# Patient Record
Sex: Female | Born: 1965 | Race: Black or African American | Hispanic: No | Marital: Single | Smoking: Never smoker
Health system: Southern US, Community
[De-identification: ages and names within clinical notes are randomized; demographics above are authoritative.]

## PROBLEM LIST (undated history)

## (undated) DIAGNOSIS — I10 Essential (primary) hypertension: Secondary | ICD-10-CM

## (undated) DIAGNOSIS — E119 Type 2 diabetes mellitus without complications: Secondary | ICD-10-CM

## (undated) HISTORY — PX: CHOLECYSTECTOMY: SHX55

## (undated) HISTORY — PX: FRACTURE SURGERY: SHX138

---

## 2006-08-18 ENCOUNTER — Emergency Department: Payer: Self-pay | Admitting: Emergency Medicine

## 2009-09-09 ENCOUNTER — Emergency Department: Payer: Self-pay | Admitting: Emergency Medicine

## 2009-09-28 ENCOUNTER — Ambulatory Visit: Payer: Self-pay | Admitting: Orthopedic Surgery

## 2009-12-17 ENCOUNTER — Ambulatory Visit: Payer: Self-pay | Admitting: Unknown Physician Specialty

## 2010-03-10 ENCOUNTER — Emergency Department: Payer: Self-pay | Admitting: Unknown Physician Specialty

## 2010-03-12 ENCOUNTER — Emergency Department: Payer: Self-pay | Admitting: Unknown Physician Specialty

## 2011-12-21 ENCOUNTER — Emergency Department: Payer: Self-pay | Admitting: Emergency Medicine

## 2014-09-04 ENCOUNTER — Ambulatory Visit: Payer: Self-pay | Admitting: Family Medicine

## 2014-09-10 ENCOUNTER — Ambulatory Visit: Payer: Self-pay | Admitting: Family Medicine

## 2014-11-03 ENCOUNTER — Ambulatory Visit (INDEPENDENT_AMBULATORY_CARE_PROVIDER_SITE_OTHER): Payer: BLUE CROSS/BLUE SHIELD | Admitting: Podiatry

## 2014-11-03 ENCOUNTER — Ambulatory Visit (INDEPENDENT_AMBULATORY_CARE_PROVIDER_SITE_OTHER): Payer: BLUE CROSS/BLUE SHIELD

## 2014-11-03 ENCOUNTER — Encounter: Payer: Self-pay | Admitting: Podiatry

## 2014-11-03 VITALS — BP 167/87 | HR 96 | Resp 16 | Ht 66.0 in | Wt 302.0 lb

## 2014-11-03 DIAGNOSIS — E119 Type 2 diabetes mellitus without complications: Secondary | ICD-10-CM

## 2014-11-03 DIAGNOSIS — M7662 Achilles tendinitis, left leg: Secondary | ICD-10-CM

## 2014-11-03 DIAGNOSIS — M722 Plantar fascial fibromatosis: Secondary | ICD-10-CM

## 2014-11-03 MED ORDER — MELOXICAM 15 MG PO TABS: 15.0000 mg | ORAL_TABLET | Freq: Every day | ORAL | Status: DC

## 2014-11-03 NOTE — Patient Instructions (Signed)

## 2014-11-03 NOTE — Progress Notes (Signed)
   Subjective:    Patient ID: Yesenia Stevens, female    DOB: 12/19/1965, 49 y.o.   MRN: [id number]  HPI Comments: "I have pain in this ankle"  Patient c/o aching lateral ankle/some posterior heel left for about 1-2 years. She notices that it has really been swollen recently. The whole leg seems to swell now. Pain AM. No treatment.  Diabetic since 2006 and unknown last A1C.     Review of Systems  Cardiovascular: Positive for leg swelling.  All other systems reviewed and are negative.      Objective:   Physical Exam: I have reviewed her past mental history medications allergies surgery social history and review of systems. Pulses are strongly palpable bilateral. Neurologic sensorium is intact for Semmes-Weinstein monofilament. Deep tendon reflexes are intact lateral and muscle strength is 5 over 5 dorsiflexion plantar flexors and inverters everters all into the musculature is intact. Orthopedic evaluation tono pain on palpation of the ankles are of the tendons crossing the ankle. She does have pain on palpation of the medial calcaneal tubercle of the left heel. None on the right. Radiographs of the bilateral foot demonstrates soft tissue increase in density plantar fascial calcaneal insertion site left foot.        Assessment & Plan:  Assessment: Plantar fasciitis left.  Plan: Discussed etiology pathology conservative versus surgical therapies. Start her on a Medrol Dosepak followed him. At which time we injected the left heel and put her in a plantar fascial brace and a night splint as well. We discussed appropriate shoe gear stretching exercises ice therapy and she can modifications. I will follow-up with her in 1 month area

## 2014-12-03 ENCOUNTER — Ambulatory Visit (INDEPENDENT_AMBULATORY_CARE_PROVIDER_SITE_OTHER): Payer: BLUE CROSS/BLUE SHIELD | Admitting: Podiatry

## 2014-12-03 VITALS — BP 186/110 | HR 100 | Resp 16

## 2014-12-03 DIAGNOSIS — M779 Enthesopathy, unspecified: Principal | ICD-10-CM

## 2014-12-03 DIAGNOSIS — M7752 Other enthesopathy of left foot: Secondary | ICD-10-CM | POA: Diagnosis not present

## 2014-12-03 DIAGNOSIS — M778 Other enthesopathies, not elsewhere classified: Secondary | ICD-10-CM

## 2014-12-04 NOTE — Progress Notes (Signed)
She presents today stating that her left foot and ankle are doing much better. She still has some tenderness around the sinus tarsi area she points to it and also to the posterior lateral area of the left ankle.  Objective: Vital signs are stable alert and oriented 3 pulses are palpable. She is pain on palpation of the sinus tarsi left as well as the peroneal tendons.  Assessment: Sinus tarsitis left foot resolving plantar fasciitis left. Peroneal tendinitis left.  Plan: Injected the left sinus tarsi again today with Kenalog and local anesthesia will consider injection to the peroneal tendons next visit or consider MRI.

## 2014-12-24 ENCOUNTER — Ambulatory Visit: Payer: BLUE CROSS/BLUE SHIELD | Admitting: Podiatry

## 2015-11-20 ENCOUNTER — Encounter: Payer: Self-pay | Admitting: *Deleted

## 2015-11-20 ENCOUNTER — Ambulatory Visit
Admission: EM | Admit: 2015-11-20 | Discharge: 2015-11-20 | Disposition: A | Discharge status: Home / Self Care | Payer: Worker's Compensation | Attending: Family Medicine | Admitting: Family Medicine

## 2015-11-20 ENCOUNTER — Ambulatory Visit (INDEPENDENT_AMBULATORY_CARE_PROVIDER_SITE_OTHER): Payer: Worker's Compensation

## 2015-11-20 DIAGNOSIS — M5412 Radiculopathy, cervical region: Secondary | ICD-10-CM

## 2015-11-20 DIAGNOSIS — M5030 Other cervical disc degeneration, unspecified cervical region: Secondary | ICD-10-CM

## 2015-11-20 HISTORY — DX: Type 2 diabetes mellitus without complications: E11.9

## 2015-11-20 HISTORY — DX: Essential (primary) hypertension: I10

## 2015-11-20 MED ORDER — NAPROXEN 500 MG PO TABS: 500.0000 mg | ORAL_TABLET | Freq: Two times a day (BID) | ORAL | Status: DC

## 2015-11-20 MED ORDER — KETOROLAC TROMETHAMINE 60 MG/2ML IM SOLN
60.0000 mg | Freq: Once | INTRAMUSCULAR | Status: AC
  Administered 2015-11-20: 60 mg via INTRAMUSCULAR

## 2015-11-20 MED ORDER — METAXALONE 800 MG PO TABS: 800.0000 mg | ORAL_TABLET | Freq: Three times a day (TID) | ORAL | Status: DC

## 2015-11-20 NOTE — Discharge Instructions (Signed)

## 2015-11-20 NOTE — ED Provider Notes (Signed)
CSN: [id number]     Arrival date & time 11/20/15  0930 History   First MD Initiated Contact with Patient 11/20/15 1001     Chief Complaint  Patient presents with  . Shoulder Injury   (Consider location/radiation/quality/duration/timing/severity/associated sxs/prior Treatment) HPI   This a 50 year old female who was injured at work on 11/19/2015 she states that she was working in a different department than usual ,when she lifted a tote was heavier than anticipated and she struggled to try and lifted onto a shelf. She felt immediate pain and indicates the left trapezial area with radiation into her left upper extremity affecting the little and ring fingers. The pain continued throughout the night and today noted the injury and was sent here for evaluation. SHe has no numbness or tingling and no loss of power. Is able to raise her shoulder comfortably actively and against resistance.  Past Medical History  Diagnosis Date  . Diabetes mellitus without complication (HCC)   . Hypertension    Past Surgical History  Procedure Laterality Date  . Cesarean section    . Cholecystectomy     Family History  Problem Relation Age of Onset  . Hypertension Mother   . Diabetes Mother   . Hypertension Father   . Diabetes Father    Social History  Substance Use Topics  . Smoking status: Never Smoker   . Smokeless tobacco: None  . Alcohol Use: No   OB History    No data available     Review of Systems  Constitutional: Positive for activity change. Negative for fever, chills and fatigue.  Musculoskeletal: Positive for myalgias and neck pain.  All other systems reviewed and are negative.   Allergies  Review of patient's allergies indicates no known allergies.  Home Medications   Prior to Admission medications   Medication Sig Start Date End Date Taking? Authorizing Provider  LOSARTAN POTASSIUM PO Take by mouth.   Yes Historical Provider, MD  metFORMIN (GLUCOPHAGE) 500 MG tablet Take by  mouth 2 (two) times daily with a meal.   Yes Historical Provider, MD  meloxicam (MOBIC) 15 MG tablet Take 1 tablet (15 mg total) by mouth daily. 11/03/14   Max T Hyatt, DPM  metaxalone (SKELAXIN) 800 MG tablet Take 1 tablet (800 mg total) by mouth 3 (three) times daily. 11/20/15   Lutricia Feil, PA-C  naproxen (NAPROSYN) 500 MG tablet Take 1 tablet (500 mg total) by mouth 2 (two) times daily with a meal. 11/20/15   Lutricia Feil, PA-C   Meds Ordered and Administered this Visit   Medications  ketorolac (TORADOL) injection 60 mg (60 mg Intramuscular Given 11/20/15 1109)    BP 148/94 mmHg  Pulse 84  Temp(Src) 98 F (36.7 C) (Oral)  Resp 16  Ht  (1.676 m)  Wt 300 lb (136.079 kg)  BMI 48.44 kg/m2  SpO2 98%  LMP 08/20/2015 (Approximate) No data found.   Physical Exam  Constitutional: She is oriented to person, place, and time. She appears well-developed and well-nourished. No distress.  HENT:  Head: Normocephalic and atraumatic.  Right Ear: External ear normal.  Left Ear: External ear normal.  Nose: Nose normal.  Mouth/Throat: Oropharynx is clear and moist. No oropharyngeal exudate.  Eyes: Conjunctivae are normal. Pupils are equal, round, and reactive to light.  Neck: Neck supple.  Examination of the cervical spine is good range of motion but with discomfort at the extremes of rotation to the left maximum extension and mostly with lateral  flexion and extension. Is reproduces her symptoms. She is very tender in the trapezial muscles. Shoulder range of motion is full and comfortable. Elbow and wrist and finger motion is as well. Strength is intact to clinical stressing. Sensation is intact distally. DTRs are 2+ over 4 and symmetrical.  Musculoskeletal: Normal range of motion. She exhibits tenderness. She exhibits no edema.  Refer to neck examination  Neurological: She is alert and oriented to person, place, and time. She has normal reflexes. She displays normal reflexes. No cranial  nerve deficit. She exhibits normal muscle tone. Coordination normal.  Skin: Skin is warm and dry. She is not diaphoretic.  Psychiatric: She has a normal mood and affect. Her behavior is normal. Judgment and thought content normal.  Nursing note and vitals reviewed.   ED Course  Procedures (including critical care time)  Labs Review Labs Reviewed - No data to display  Imaging Review Dg Cervical Spine Complete  11/20/2015  CLINICAL DATA:  No history of neck injury/surgeries. Lifted heavy storage box yesterday morning (11/19/15), was heavier than expected. Very painful in posterior shoulder area on left side, difficult to lift left arm. EXAM: CERVICAL SPINE - COMPLETE 4+ VIEW COMPARISON:  None. FINDINGS: Normal alignment with no fracture. Mild degenerative disc disease at C3-4 and C4-5 with moderate C5-6 and mild C6-7 degenerative disc disease. Moderate C7-T1 degenerative disc disease. No prevertebral soft tissue swelling. Mild C4-5 and C5-6 foraminal narrowing due to uncovertebral hypertrophy. IMPRESSION: No acute osseous abnormalities.  Degenerative changes as described. Electronically Signed   By: Esperanza Heiraymond  Rubner M.D.   On: 11/20/2015 11:21     Visual Acuity Review  Right Eye Distance:   Left Eye Distance:   Bilateral Distance:    Right Eye Near:   Left Eye Near:    Bilateral Near:     Medications  ketorolac (TORADOL) injection 60 mg (60 mg Intramuscular Given 11/20/15 1109)      MDM   1. Cervical radiculopathy at C8   2. DDD (degenerative disc disease), cervical    Discharge Medication List as of 11/20/2015 11:48 AM    START taking these medications   Details  metaxalone (SKELAXIN) 800 MG tablet Take 1 tablet (800 mg total) by mouth 3 (three) times daily., Starting 11/20/2015, Until Discontinued, Normal    naproxen (NAPROSYN) 500 MG tablet Take 1 tablet (500 mg total) by mouth 2 (two) times daily with a meal., Starting 11/20/2015, Until Discontinued, Normal      Plan: 1.  Test/x-ray results and diagnosis reviewed with patient 2. rx as per orders; risks, benefits, potential side effects reviewed with patient 3. Recommend supportive treatment with Symptom avoidance . Rest and heat and/or ice as necessary for comfort. Restrictions were provided to the patient of lifting and pushing pulling of greater than 10 pounds. Limited use of the left upper extremity for 1 week duration. She should follow up with Worthy Rancherommy Moore,FNP at [facility] if still having symptoms. 4. F/u prn if symptoms worsen or don't improve    Lutricia FeilWilliam P Jacari Iannello, PA-C 11/20/15 1158

## 2015-11-20 NOTE — ED Notes (Signed)
Injured left shoulder while pulling a heavy object at work. C/o left shoulder pain.

## 2015-12-10 ENCOUNTER — Ambulatory Visit
Admission: RE | Admit: 2015-12-10 | Discharge: 2015-12-10 | Disposition: A | Discharge status: Home / Self Care | Payer: BLUE CROSS/BLUE SHIELD | Source: Ambulatory Visit | Attending: Family | Admitting: Family

## 2015-12-10 ENCOUNTER — Other Ambulatory Visit: Payer: Self-pay | Admitting: Family

## 2015-12-10 ENCOUNTER — Ambulatory Visit
Admission: RE | Admit: 2015-12-10 | Discharge: 2015-12-10 | Disposition: A | Discharge status: Home / Self Care | Payer: Worker's Compensation | Source: Ambulatory Visit | Attending: Family | Admitting: Family

## 2015-12-10 DIAGNOSIS — M25512 Pain in left shoulder: Secondary | ICD-10-CM | POA: Insufficient documentation

## 2016-07-19 ENCOUNTER — Emergency Department: Payer: Self-pay

## 2016-07-19 ENCOUNTER — Emergency Department
Admission: EM | Admit: 2016-07-19 | Discharge: 2016-07-19 | Disposition: A | Discharge status: Home / Self Care | Payer: Self-pay | Attending: Emergency Medicine | Admitting: Emergency Medicine

## 2016-07-19 DIAGNOSIS — E119 Type 2 diabetes mellitus without complications: Secondary | ICD-10-CM | POA: Insufficient documentation

## 2016-07-19 DIAGNOSIS — J209 Acute bronchitis, unspecified: Secondary | ICD-10-CM | POA: Insufficient documentation

## 2016-07-19 DIAGNOSIS — Z7984 Long term (current) use of oral hypoglycemic drugs: Secondary | ICD-10-CM | POA: Insufficient documentation

## 2016-07-19 DIAGNOSIS — I10 Essential (primary) hypertension: Secondary | ICD-10-CM | POA: Insufficient documentation

## 2016-07-19 DIAGNOSIS — Z79899 Other long term (current) drug therapy: Secondary | ICD-10-CM | POA: Insufficient documentation

## 2016-07-19 MED ORDER — ALBUTEROL SULFATE HFA 108 (90 BASE) MCG/ACT IN AERS: 1.0000 | INHALATION_SPRAY | Freq: Four times a day (QID) | RESPIRATORY_TRACT | 0 refills | Status: DC | PRN

## 2016-07-19 MED ORDER — AZITHROMYCIN 250 MG PO TABS: ORAL_TABLET | ORAL | 0 refills | Status: DC

## 2016-07-19 MED ORDER — IPRATROPIUM-ALBUTEROL 0.5-2.5 (3) MG/3ML IN SOLN
3.0000 mL | Freq: Once | RESPIRATORY_TRACT | Status: AC
  Administered 2016-07-19: 3 mL via RESPIRATORY_TRACT
  Filled 2016-07-19: qty 3

## 2016-07-19 MED ORDER — PROMETHAZINE-DM 6.25-15 MG/5ML PO SYRP: 5.0000 mL | ORAL_SOLUTION | Freq: Four times a day (QID) | ORAL | 0 refills | Status: DC | PRN

## 2016-07-19 NOTE — ED Triage Notes (Signed)
URI sx and SOB with coughing X 4 days. PT denies productive sputum. Pt speaks in full and complete sentences. Pt alert and oriented X4, active, cooperative, pt in NAD. RR even and unlabored, color WNL.

## 2016-07-19 NOTE — ED Provider Notes (Signed)
[facility] Emergency Department Provider Note  ____________________________________________  Time seen: Approximately 11:48 AM  I have reviewed the triage vital signs and the nursing notes.   HISTORY  Chief Complaint URI and Shortness of Breath    HPI Yesenia Stevens is a 51 y.o. female , NAD, presents to the emergency room 1 week history of cough, chest congestion and shortness of breath. Patient states she has had and upper respiratory infection for approximately one week. Has not been taking anything over-the-counter but did take 1 tablet a Mucinex last night. Denies any wheezing but has had coughing fits and when she feels short of breath. States is difficult to get a deep breath. Denies any chest pain or palpitations. Has had no headaches, visual changes, numbness, weakness or tingling. Also endorses nasal congestion, runny nose and sinus pressure. No fevers, chills or body aches.No abdominal pain, nausea, vomiting or diarrhea.   Past Medical History:  Diagnosis Date  . Diabetes mellitus without complication (HCC)   . Hypertension     There are no active problems to display for this patient.   Past Surgical History:  Procedure Laterality Date  . CESAREAN SECTION    . CHOLECYSTECTOMY      Prior to Admission medications   Medication Sig Start Date End Date Taking? Authorizing Provider  albuterol (PROVENTIL HFA;VENTOLIN HFA) 108 (90 Base) MCG/ACT inhaler Inhale 1-2 puffs into the lungs every 6 (six) hours as needed for wheezing or shortness of breath. 07/19/16   Jami L Hagler, PA-C  azithromycin (ZITHROMAX Z-PAK) 250 MG tablet Take 2 tablets (500 mg) on  Day 1,  followed by 1 tablet (250 mg) once daily on Days 2 through 5. 07/19/16   Jami L Hagler, PA-C  LOSARTAN POTASSIUM PO Take by mouth.    Historical Provider, MD  meloxicam (MOBIC) 15 MG tablet Take 1 tablet (15 mg total) by mouth daily. 11/03/14   Max T Hyatt, DPM  metaxalone (SKELAXIN) 800 MG  tablet Take 1 tablet (800 mg total) by mouth 3 (three) times daily. 11/20/15   Lutricia FeilWilliam P Roemer, PA-C  metFORMIN (GLUCOPHAGE) 500 MG tablet Take by mouth 2 (two) times daily with a meal.    Historical Provider, MD  naproxen (NAPROSYN) 500 MG tablet Take 1 tablet (500 mg total) by mouth 2 (two) times daily with a meal. 11/20/15   Lutricia FeilWilliam P Roemer, PA-C  promethazine-dextromethorphan (PROMETHAZINE-DM) 6.25-15 MG/5ML syrup Take 5 mLs by mouth 4 (four) times daily as needed for cough. 07/19/16   Jami L Hagler, PA-C    Allergies Patient has no known allergies.  Family History  Problem Relation Age of Onset  . Hypertension Mother   . Diabetes Mother   . Hypertension Father   . Diabetes Father     Social History Social History  Substance Use Topics  . Smoking status: Never Smoker  . Smokeless tobacco: Not on file  . Alcohol use No     Review of Systems  Constitutional: No fever/chills Eyes: No visual changes.  ENT: Positive nasal congestion, runny nose. No sore throat, ear pain, ear discharge, sinus pressure. Cardiovascular: No chest pain. Respiratory: Positive cough, chest congestion that cause shortness of breath. No wheezing.  Gastrointestinal: No abdominal pain.  No nausea, vomiting.  No diarrhea.  Musculoskeletal: Negative for generalized myalgias.  Skin: Negative for rash. Neurological: Negative for headaches, focal weakness or numbness. No tingling. 10-point ROS otherwise negative.  ____________________________________________   PHYSICAL EXAM:  VITAL SIGNS: ED Triage Vitals  Enc Vitals Group     BP 07/19/16 1124 (!) 151/81     Pulse Rate 07/19/16 1124 98     Resp 07/19/16 1124 20     Temp 07/19/16 1124 98.8 F (37.1 C)     Temp Source 07/19/16 1124 Oral     SpO2 07/19/16 1124 99 %     Weight 07/19/16 1125 (!) 314 lb (142.4 kg)     Height 07/19/16 1125 5\' 6"  (1.676 m)     Head Circumference --      Peak Flow --      Pain Score --      Pain Loc --      Pain Edu? --       Excl. in GC? --      Constitutional: Alert and oriented. Well appearing and in no acute distress. Eyes: Conjunctivae are normal.  Head: Atraumatic. ENT:      Ears: TMs visualized bilaterally with mild serous effusion but no erythema, bulging or perforation.      Nose: Mild congestion with clear rhinorrhea. Bilateral turbinates are injected.      Mouth/Throat: Mucous membranes are moist. Nares without erythema, swelling, exudate. Uvula is midline. Airway is patent. Clear postnasal drainage. Neck: No stridor. Supple with full range of motion. Hematological/Lymphatic/Immunilogical: No cervical lymphadenopathy. Cardiovascular: Normal rate, regular rhythm. Normal S1 and S2.  Good peripheral circulation. Respiratory: Normal respiratory effort without tachypnea or retractions. Lungs With coarse breath sounds noted in the middle and lower lung fields. No wheezes, rhonchi or rales. Neurologic:  Normal speech and language. No gross focal neurologic deficits are appreciated.  Skin:  Skin is warm, dry and intact. No rash noted. Psychiatric: Mood and affect are normal. Speech and behavior are normal. Patient exhibits appropriate insight and judgement.   ____________________________________________   LABS  None ____________________________________________  EKG  EKG reveals normal sinus rhythm with a ventricular rate of 97 beats per minute. No acute changes or evidence of STEMI. EKG also reviewed by Dr. Daryel November. ____________________________________________  RADIOLOGY Melonie [place], personally viewed and evaluated these images (plain radiographs) as part of my medical decision making, as well as reviewing the written report by the radiologist.  No results found.  Chest x-ray without any evidence of infiltrate, effusion. No cardiomegaly. ____________________________________________    PROCEDURES  Procedure(s) performed: None   Procedures   Medications   ipratropium-albuterol (DUONEB) 0.5-2.5 (3) MG/3ML nebulizer solution 3 mL (3 mLs Nebulization Given 07/19/16 1212)     ____________________________________________   INITIAL IMPRESSION / ASSESSMENT AND PLAN / ED COURSE  Pertinent labs & imaging results that were available during my care of the patient were reviewed by me and considered in my medical decision making (see chart for details).     Patient's diagnosis is consistent with acute bronchitis. Patient tolerated DuoNeb nebulized solution well while in the emergency department and noted decreased chest congestion and more ease of breathing. Chest x-ray without any evidence of pneumonia or effusion. Patient will be discharged home with prescriptions for azithromycin, promethazine DM and an albuterol inhaler to use as directed. May take over-the-counter Tylenol or ibuprofen as needed for aches or fever. Patient is to follow up with [facility] community clinic with [facility] if symptoms persist past this treatment course. Patient is given ED precautions to return to the ED for any worsening or new symptoms.    ____________________________________________  FINAL CLINICAL IMPRESSION(S) / ED DIAGNOSES  Final diagnoses:  Acute bronchitis, unspecified organism  NEW MEDICATIONS STARTED DURING THIS VISIT:  Discharge Medication List as of 07/19/2016  1:49 PM    START taking these medications   Details  albuterol (PROVENTIL HFA;VENTOLIN HFA) 108 (90 Base) MCG/ACT inhaler Inhale 1-2 puffs into the lungs every 6 (six) hours as needed for wheezing or shortness of breath., Starting Tue 07/19/2016, Print    azithromycin (ZITHROMAX Z-PAK) 250 MG tablet Take 2 tablets (500 mg) on  Day 1,  followed by 1 tablet (250 mg) once daily on Days 2 through 5., Print    promethazine-dextromethorphan (PROMETHAZINE-DM) 6.25-15 MG/5ML syrup Take 5 mLs by mouth 4 (four) times daily as needed for cough., Starting Tue 07/19/2016, Print              Ernestene Kiel [place], PA-C 07/21/16 1610    Emily Filbert, MD 07/21/16 [phone]

## 2016-10-10 ENCOUNTER — Encounter: Payer: Self-pay | Admitting: Emergency Medicine

## 2016-10-10 ENCOUNTER — Ambulatory Visit (INDEPENDENT_AMBULATORY_CARE_PROVIDER_SITE_OTHER)
Admission: EM | Admit: 2016-10-10 | Discharge: 2016-10-10 | Disposition: A | Discharge status: Home / Self Care | Payer: BLUE CROSS/BLUE SHIELD | Source: Home / Self Care | Attending: Family Medicine | Admitting: Family Medicine

## 2016-10-10 ENCOUNTER — Emergency Department: Payer: BLUE CROSS/BLUE SHIELD

## 2016-10-10 ENCOUNTER — Emergency Department
Admission: EM | Admit: 2016-10-10 | Discharge: 2016-10-10 | Disposition: A | Discharge status: Home / Self Care | Payer: BLUE CROSS/BLUE SHIELD | Attending: Emergency Medicine | Admitting: Emergency Medicine

## 2016-10-10 DIAGNOSIS — R079 Chest pain, unspecified: Secondary | ICD-10-CM

## 2016-10-10 DIAGNOSIS — I10 Essential (primary) hypertension: Secondary | ICD-10-CM | POA: Insufficient documentation

## 2016-10-10 DIAGNOSIS — E119 Type 2 diabetes mellitus without complications: Secondary | ICD-10-CM | POA: Insufficient documentation

## 2016-10-10 DIAGNOSIS — R0789 Other chest pain: Secondary | ICD-10-CM | POA: Insufficient documentation

## 2016-10-10 DIAGNOSIS — Z7984 Long term (current) use of oral hypoglycemic drugs: Secondary | ICD-10-CM | POA: Diagnosis not present

## 2016-10-10 LAB — BASIC METABOLIC PANEL
Anion gap: 5 (ref 5–15)
BUN: 16 mg/dL (ref 6–20)
CALCIUM: 9.4 mg/dL (ref 8.9–10.3)
CHLORIDE: 102 mmol/L (ref 101–111)
CO2: 30 mmol/L (ref 22–32)
CREATININE: 0.7 mg/dL (ref 0.44–1.00)
GFR calc Af Amer: >60 mL/min (ref >60)
GFR calc non Af Amer: >60 mL/min (ref >60)
Glucose, Bld: 141 mg/dL — ABNORMAL HIGH (ref 65–99)
Potassium: 4.2 mmol/L (ref 3.5–5.1)
SODIUM: 137 mmol/L (ref 135–145)

## 2016-10-10 LAB — CBC
HEMATOCRIT: 36.8 % (ref 35.0–47.0)
Hemoglobin: 12 g/dL (ref 12.0–16.0)
MCH: 26.1 pg (ref 26.0–34.0)
MCHC: 32.6 g/dL (ref 32.0–36.0)
MCV: 80 fL (ref 80.0–100.0)
Platelets: 319 10*3/uL (ref 150–440)
RBC: 4.6 MIL/uL (ref 3.80–5.20)
RDW: 15.9 % — ABNORMAL HIGH (ref 11.5–14.5)
WBC: 10.9 10*3/uL (ref 3.6–11.0)

## 2016-10-10 LAB — TROPONIN I: Troponin I: <0.03 ng/mL (ref <0.03)

## 2016-10-10 MED ORDER — ASPIRIN 81 MG PO CHEW
324.0000 mg | CHEWABLE_TABLET | Freq: Once | ORAL | Status: AC
  Administered 2016-10-10: 324 mg via ORAL

## 2016-10-10 MED ORDER — GI COCKTAIL ~~LOC~~
30.0000 mL | Freq: Once | ORAL | Status: AC
  Administered 2016-10-10: 30 mL via ORAL
  Filled 2016-10-10: qty 30

## 2016-10-10 NOTE — ED Triage Notes (Signed)
Patient states she has had intermittent chest pressure and discomfort for the past year. Patient states it has been constant for the past several days

## 2016-10-10 NOTE — ED Provider Notes (Signed)
[facility] Emergency Department Provider Note  Time seen: 9:07 PM  I have reviewed the triage vital signs and the nursing notes.   HISTORY  Chief Complaint Chest Pain    HPI Yesenia Stevens is a 51 y.o. female with a past medical history of diabetes, hypertension, presents the emergency department for chest discomfort. According to the patient for the past 1-2 years she has been experiencing intermittent chest discomfort which she describes as a gas bubble in the chest which will not come out. Patient states the discomfort will last for seconds to minutes and then resolved. She states over the past 24 hours it has been occurring much more frequently. However currently during her examination of patient denies any chest discomfort. Denies any shortness of breath nausea or diaphoresis at any point. Denies leg pain or swelling. Denies any cough or congestion. Patient does state a history of reflux but states this feels different.  Past Medical History:  Diagnosis Date  . Diabetes mellitus without complication (HCC)   . Hypertension     There are no active problems to display for this patient.   Past Surgical History:  Procedure Laterality Date  . CESAREAN SECTION    . CHOLECYSTECTOMY      Prior to Admission medications   Medication Sig Start Date End Date Taking? Authorizing Provider  albuterol (PROVENTIL HFA;VENTOLIN HFA) 108 (90 Base) MCG/ACT inhaler Inhale 1-2 puffs into the lungs every 6 (six) hours as needed for wheezing or shortness of breath. 07/19/16   Jami L Hagler, PA-C  azithromycin (ZITHROMAX Z-PAK) 250 MG tablet Take 2 tablets (500 mg) on  Day 1,  followed by 1 tablet (250 mg) once daily on Days 2 through 5. 07/19/16   Jami L Hagler, PA-C  hydrochlorothiazide (MICROZIDE) 12.5 MG capsule Take 12.5 mg by mouth daily.    Historical Provider, MD  LOSARTAN POTASSIUM PO Take by mouth.    Historical Provider, MD  meloxicam (MOBIC) 15 MG tablet Take 1  tablet (15 mg total) by mouth daily. 11/03/14   Max T Hyatt, DPM  metaxalone (SKELAXIN) 800 MG tablet Take 1 tablet (800 mg total) by mouth 3 (three) times daily. 11/20/15   Lutricia Feil, PA-C  metFORMIN (GLUCOPHAGE) 500 MG tablet Take by mouth 2 (two) times daily with a meal.    Historical Provider, MD  naproxen (NAPROSYN) 500 MG tablet Take 1 tablet (500 mg total) by mouth 2 (two) times daily with a meal. 11/20/15   Lutricia Feil, PA-C  promethazine-dextromethorphan (PROMETHAZINE-DM) 6.25-15 MG/5ML syrup Take 5 mLs by mouth 4 (four) times daily as needed for cough. 07/19/16   Jami L Hagler, PA-C    No Known Allergies  Family History  Problem Relation Age of Onset  . Hypertension Mother   . Diabetes Mother   . Hypertension Father   . Diabetes Father     Social History Social History  Substance Use Topics  . Smoking status: Never Smoker  . Smokeless tobacco: Never Used  . Alcohol use Not on file    Review of Systems Constitutional: Negative for fever. Cardiovascular: Intermittent chest discomfort 1-2 years. Respiratory: Negative for shortness of breath. Gastrointestinal: Negative for abdominal pain Neurological: Negative for headache All other ROS negative  ____________________________________________   PHYSICAL EXAM:  VITAL SIGNS: ED Triage Vitals  Enc Vitals Group     BP 10/10/16 1919 (!) 158/96     Pulse Rate 10/10/16 1919 (!) 101     Resp 10/10/16 1919  16     Temp 10/10/16 1919 98.3 F (36.8 C)     Temp Source 10/10/16 1919 Oral     SpO2 10/10/16 1919 97 %     Weight 10/10/16 1919 (!) 314 lb (142.4 kg)     Height 10/10/16 1919  (1.676 m)     Head Circumference --      Peak Flow --      Pain Score 10/10/16 2104 0     Pain Loc --      Pain Edu? --      Excl. in GC? --     Constitutional: Alert and oriented. Well appearing and in no distress. Eyes: Normal exam ENT   Head: Normocephalic and atraumatic   Mouth/Throat: Mucous membranes are  moist. Cardiovascular: Normal rate, regular rhythm. No murmur Respiratory: Normal respiratory effort without tachypnea nor retractions. Breath sounds are clear  Gastrointestinal: Soft and nontender. No distention.  Musculoskeletal: Nontender with normal range of motion in all extremities Neurologic:  Normal speech and language. No gross focal neurologic deficits . Skin:  Skin is warm, dry and intact.  Psychiatric: Mood and affect are normal.   ____________________________________________    EKG  EKG reviewed and interpreted by myself shows normal sinus rhythm at 94 bpm, narrow QRS, normal axis, normal intervals, nonspecific ST changes without ST elevation.  ____________________________________________    RADIOLOGY  Chest x-ray negative  ____________________________________________   INITIAL IMPRESSION / ASSESSMENT AND PLAN / ED COURSE  Pertinent labs & imaging results that were available during my care of the patient were reviewed by me and considered in my medical decision making (see chart for details).  Patient presents to the emergency department for intermittent chest discomfort of the past 1-2 years worse/more frequent over the past 24 hours. Patient's labs including troponin are negative. Chest x-rays negative. EKG is reassuring. Initially the patient denied any discomfort although during the end of her evaluation she began saying that she was feeling somewhat of a gas bubble. We will attempt a GI cocktail for symptom relief. Given the patient's intermittent symptoms for the last 1-2 years with a negative troponin I feel that ACS is very unlikely. However given the patient's age and comorbidities I believe she would greatly benefit from a urgent stress test. I discussed this with the patient who states she will call cardiology tomorrow morning to arrange the next available stress test. I also discussed my normal chest pain return precautions.  Patient states no further  episodes of discomfort after GI cocktail. This doesn't discussed cardiology follow-up with the patient, she states she will call tomorrow.  ____________________________________________   FINAL CLINICAL IMPRESSION(S) / ED DIAGNOSES  Chest pain    Minna Antis, MD 10/10/16 2221

## 2016-10-10 NOTE — Discharge Instructions (Signed)
You have been seen in the emergency department today for chest pain. Your workup has shown normal results. As we discussed please follow-up with your primary care physician in the next 1-2 days for recheck. Return to the emergency department for any further chest pain, trouble breathing, or any other symptom personally concerning to yourself. °

## 2016-10-10 NOTE — ED Triage Notes (Signed)
Pt ambulatory to triage with steady gait, no distress noted. Pt seen at urgent care in [facility] for chest pressure, sent to ED for further evaluation. Pt c/o of intermittent central chest pain x1 year but in last 2 days pressure has stayed  constant and unrelieved. Pt denies SOB, N/V or dizziness.

## 2016-10-10 NOTE — ED Provider Notes (Addendum)
[facility]    CSN: [id number] Arrival date & time: 10/10/16  1701     History   Chief Complaint Chief Complaint  Patient presents with  . Chest Pain    HPI Yesenia Stevens is a 51 y.o. female.   Patient is a 51 year old black female with history diabetes and hypertension who over the last few months has been having occasional chest pain. She reports the chest pain as stabbing pain last a few seconds and clear. Last night she started having this chest discomfort on the left upper chest. She states the pain was somewhat intense finally cleared on its own but then started back up again this morning. She does admit after examination that she has felt her heart skip as well. She denies any shortness of breath or radiation of his pain. Along with diabetes and hypertension she reports no other major medical problems. She has had cholecystectomy and C-section on the same year. She denies any family history of significant heart disease. No history of sudden death in the family. She does not smoke and she is not exposed to anyone smoking around her. She still having some pain in her chest now it did get better for a while but is starting to come back again.   The history is provided by the patient. No language interpreter was used.  Chest Pain  Pain location:  Substernal area and L chest Pain quality: aching, burning and stabbing   Pain radiates to:  Does not radiate Pain severity:  Moderate Onset quality:  Unable to specify Timing:  Constant Progression:  Worsening Chronicity:  New Context: breathing and movement   Relieved by:  Nothing Worsened by:  Coughing Associated symptoms: no fever and no weakness   Risk factors: diabetes mellitus     Past Medical History:  Diagnosis Date  . Diabetes mellitus without complication (HCC)   . Hypertension     There are no active problems to display for this patient.   Past Surgical History:  Procedure Laterality Date  .  CESAREAN SECTION    . CHOLECYSTECTOMY      OB History    No data available       Home Medications    Prior to Admission medications   Medication Sig Start Date End Date Taking? Authorizing Provider  hydrochlorothiazide (MICROZIDE) 12.5 MG capsule Take 12.5 mg by mouth daily.   Yes Historical Provider, MD  albuterol (PROVENTIL HFA;VENTOLIN HFA) 108 (90 Base) MCG/ACT inhaler Inhale 1-2 puffs into the lungs every 6 (six) hours as needed for wheezing or shortness of breath. 07/19/16   Jami L Hagler, PA-C  azithromycin (ZITHROMAX Z-PAK) 250 MG tablet Take 2 tablets (500 mg) on  Day 1,  followed by 1 tablet (250 mg) once daily on Days 2 through 5. 07/19/16   Jami L Hagler, PA-C  LOSARTAN POTASSIUM PO Take by mouth.    Historical Provider, MD  meloxicam (MOBIC) 15 MG tablet Take 1 tablet (15 mg total) by mouth daily. 11/03/14   Max T Hyatt, DPM  metaxalone (SKELAXIN) 800 MG tablet Take 1 tablet (800 mg total) by mouth 3 (three) times daily. 11/20/15   Lutricia Feil, PA-C  metFORMIN (GLUCOPHAGE) 500 MG tablet Take by mouth 2 (two) times daily with a meal.    Historical Provider, MD  naproxen (NAPROSYN) 500 MG tablet Take 1 tablet (500 mg total) by mouth 2 (two) times daily with a meal. 11/20/15   Lutricia Feil, PA-C  promethazine-dextromethorphan (  PROMETHAZINE-DM) 6.25-15 MG/5ML syrup Take 5 mLs by mouth 4 (four) times daily as needed for cough. 07/19/16   Jami L Hagler, PA-C    Family History Family History  Problem Relation Age of Onset  . Hypertension Mother   . Diabetes Mother   . Hypertension Father   . Diabetes Father     Social History Social History  Substance Use Topics  . Smoking status: Never Smoker  . Smokeless tobacco: Never Used  . Alcohol use Not on file     Allergies   Patient has no known allergies.   Review of Systems Review of Systems  Constitutional: Negative for fever.  Cardiovascular: Positive for chest pain.  Neurological: Negative for weakness.  All  other systems reviewed and are negative.    Physical Exam Triage Vital Signs ED Triage Vitals  Enc Vitals Group     BP 10/10/16 1719 (!) 160/84     Pulse Rate 10/10/16 1719 95     Resp 10/10/16 1719 18     Temp 10/10/16 1719 98.1 F (36.7 C)     Temp Source 10/10/16 1719 Oral     SpO2 10/10/16 1719 100 %     Weight 10/10/16 1720 (!) 314 lb (142.4 kg)     Height 10/10/16 1720  (1.676 m)     Head Circumference --      Peak Flow --      Pain Score 10/10/16 1720 0     Pain Loc --      Pain Edu? --      Excl. in GC? --    No data found.   Updated Vital Signs BP (!) 160/84 (BP Location: Left Arm)   Pulse 95   Temp 98.1 F (36.7 C) (Oral)   Resp 18   Ht  (1.676 m)   Wt (!) 314 lb (142.4 kg)   SpO2 100%   BMI 50.68 kg/m   Visual Acuity Right Eye Distance:   Left Eye Distance:   Bilateral Distance:    Right Eye Near:   Left Eye Near:    Bilateral Near:     Physical Exam  Constitutional: She is oriented to person, place, and time. She appears well-developed and well-nourished.  HENT:  Head: Normocephalic and atraumatic.  Mouth/Throat: Oropharynx is clear and moist.  Eyes: Pupils are equal, round, and reactive to light.  Neck: Normal range of motion. Neck supple.  Cardiovascular: Normal rate and normal heart sounds.  An irregular rhythm present.  Extrasystoles are present.  Patient has occasional skipped beats  Pulmonary/Chest: Effort normal and breath sounds normal. She exhibits tenderness.  There was some chest wall tenderness but this didn't reproduce the pain that the patient was having  Musculoskeletal: Normal range of motion.  Neurological: She is alert and oriented to person, place, and time.  Skin: Skin is warm.  Psychiatric: She has a normal mood and affect.  Vitals reviewed.    UC Treatments / Results  Labs (all labs ordered are listed, but only abnormal results are displayed) Labs Reviewed - No data to display  EKG  EKG  Interpretation None       ED ECG REPORT I, Teffany Blaszczyk H, the attending physician, personally viewed and interpreted this ECG.   Date: 10/10/2016  EKG Time: 17:32:10  Rhythm:normal EKG, normal sinus rhythm, there are no previous tracings available for comparison, normal sinus rhythm  Axis: 59  Intervals:none  ST&T Change: neg   Radiology No results found.  Procedures Procedures (including critical care time)  Medications Ordered in UC Medications  aspirin chewable tablet 324 mg (324 mg Oral Given 10/10/16 1808)     Initial Impression / Assessment and Plan / UC Course  I have reviewed the triage vital signs and the nursing notes.  Pertinent labs & imaging results that were available during my care of the patient were reviewed by me and considered in my medical decision making (see chart for details).      History of atypical chest pain skipped beats with a history that could suggest worsening angina. Recommend going to the ED for serial cardiac enzymes to make sure no cardiac damage was present. Also prescription for about possible to go by a glass which she declined states she has not bills doesn't want any more pills. Will give her 4 baby aspirin contact [facility] about  herpending arrival.  Final Clinical Impressions(s) / UC Diagnoses   Final diagnoses:  Atypical chest pain    New Prescriptions Discharge Medication List as of 10/10/2016  6:11 PM       Note: This dictation was prepared with Dragon dictation along with smaller phrase technology. Any transcriptional errors that result from this process are unintentional.   Hassan Rowan, MD 10/10/16 1825    Hassan Rowan, MD 10/10/16 [phone]

## 2018-03-22 ENCOUNTER — Encounter: Payer: Self-pay | Admitting: Family Medicine

## 2018-03-22 ENCOUNTER — Other Ambulatory Visit: Payer: Self-pay

## 2018-03-22 ENCOUNTER — Ambulatory Visit: Payer: BLUE CROSS/BLUE SHIELD | Admitting: Family Medicine

## 2018-03-22 VITALS — BP 156/86 | HR 91 | Temp 99.0°F | Ht 64.5 in | Wt 315.0 lb

## 2018-03-22 DIAGNOSIS — Z7689 Persons encountering health services in other specified circumstances: Secondary | ICD-10-CM | POA: Diagnosis not present

## 2018-03-22 DIAGNOSIS — R05 Cough: Secondary | ICD-10-CM

## 2018-03-22 DIAGNOSIS — I10 Essential (primary) hypertension: Secondary | ICD-10-CM

## 2018-03-22 DIAGNOSIS — I152 Hypertension secondary to endocrine disorders: Secondary | ICD-10-CM | POA: Insufficient documentation

## 2018-03-22 DIAGNOSIS — R053 Chronic cough: Secondary | ICD-10-CM | POA: Insufficient documentation

## 2018-03-22 DIAGNOSIS — L0232 Furuncle of buttock: Secondary | ICD-10-CM | POA: Diagnosis not present

## 2018-03-22 DIAGNOSIS — E119 Type 2 diabetes mellitus without complications: Secondary | ICD-10-CM | POA: Diagnosis not present

## 2018-03-22 MED ORDER — GLIPIZIDE 5 MG PO TABS: 5.0000 mg | ORAL_TABLET | Freq: Two times a day (BID) | ORAL | 2 refills | Status: DC

## 2018-03-22 MED ORDER — METFORMIN HCL 500 MG PO TABS: 500.0000 mg | ORAL_TABLET | Freq: Two times a day (BID) | ORAL | 2 refills | Status: DC

## 2018-03-22 MED ORDER — QUINAPRIL HCL 20 MG PO TABS: 20.0000 mg | ORAL_TABLET | Freq: Every day | ORAL | 1 refills | Status: DC

## 2018-03-22 MED ORDER — HYDROCHLOROTHIAZIDE 12.5 MG PO CAPS: 12.5000 mg | ORAL_CAPSULE | Freq: Every day | ORAL | 2 refills | Status: DC

## 2018-03-22 MED ORDER — SULFAMETHOXAZOLE-TRIMETHOPRIM 800-160 MG PO TABS: 1.0000 | ORAL_TABLET | Freq: Two times a day (BID) | ORAL | 0 refills | Status: DC

## 2018-03-22 NOTE — Progress Notes (Signed)
BP (!) 156/86   Pulse 91   Temp 99 F (37.2 C) (Oral)   Ht 5' 4.5" (1.638 m)   Wt (!) 315 lb (142.9 kg)   SpO2 97%   BMI 53.23 kg/m    Subjective:    Patient ID: Yesenia Stevens, female    DOB: 1966-05-06, 52 y.o.   MRN: [id number]  HPI: Yesenia Stevens is a 52 y.o. female  Chief Complaint  Patient presents with  . New Patient (Initial Visit)    pt states she would like to have her annual exam   Here today to establish care. PMHx significant for HTN and DM. Has been out of all medications for several months. Does not check home BPs or BSs. Last f/u was 07/2016 with A1C of 9.8 at that time on medication. Was previously on metformin. Tolerated it well just didn't go back for refills and f/u. Denies low blood sugar spells.   Boil on buttock in gluteal fold about 1-2 weeks ago, bursts in the shower. Getting better slowly since bursting. Still draining at times. Not putting anything on it. Denies fevers, chills sweats.   Chronic cough, chest tightness. Albuterol not helping. Switching off lisinopril didn't help. No known hx of pulmonary dz. Non smoker.   Past Medical History:  Diagnosis Date  . Diabetes mellitus without complication (HCC)   . Hypertension    Social History   Socioeconomic History  . Marital status: Single    Spouse name: Not on file  . Number of children: Not on file  . Years of education: Not on file  . Highest education level: Not on file  Occupational History  . Not on file  Social Needs  . Financial resource strain: Not on file  . Food insecurity:    Worry: Not on file    Inability: Not on file  . Transportation needs:    Medical: Not on file    Non-medical: Not on file  Tobacco Use  . Smoking status: Never Smoker  . Smokeless tobacco: Never Used  Substance and Sexual Activity  . Alcohol use: Never    Alcohol/week: 0.0 standard drinks    Frequency: Never  . Drug use: Never  . Sexual activity: Not Currently  Lifestyle  . Physical  activity:    Days per week: Not on file    Minutes per session: Not on file  . Stress: Not on file  Relationships  . Social connections:    Talks on phone: Not on file    Gets together: Not on file    Attends religious service: Not on file    Active member of club or organization: Not on file    Attends meetings of clubs or organizations: Not on file    Relationship status: Not on file  . Intimate partner violence:    Fear of current or ex partner: Not on file    Emotionally abused: Not on file    Physically abused: Not on file    Forced sexual activity: Not on file  Other Topics Concern  . Not on file  Social History Narrative  . Not on file    Relevant past medical, surgical, family and social history reviewed and updated as indicated. Interim medical history since our last visit reviewed. Allergies and medications reviewed and updated.  Review of Systems  Per HPI unless specifically indicated above     Objective:    BP (!) 156/86   Pulse 91   Temp 99 F (  37.2 C) (Oral)   Ht 5' 4.5" (1.638 m)   Wt (!) 315 lb (142.9 kg)   SpO2 97%   BMI 53.23 kg/m   Wt Readings from Last 3 Encounters:  03/22/18 (!) 315 lb (142.9 kg)  10/10/16 (!) 314 lb (142.4 kg)  10/10/16 (!) 314 lb (142.4 kg)    Physical Exam  Constitutional: She is oriented to person, place, and time. She appears well-developed and well-nourished.  obese  HENT:  Head: Atraumatic.  Eyes: Conjunctivae and EOM are normal.  Neck: Normal range of motion. Neck supple.  Cardiovascular: Normal rate and regular rhythm.  Pulmonary/Chest: Effort normal and breath sounds normal.  Musculoskeletal: Normal range of motion.  Neurological: She is alert and oriented to person, place, and time.  Skin: Skin is warm and dry. There is erythema (small boil on buttock, healing well. Draining some clear fluid when pressure added).  Psychiatric: She has a normal mood and affect. Her behavior is normal.  Nursing note and vitals  reviewed.   Results for orders placed or performed in visit on 03/22/18  HgB A1c  Result Value Ref Range   Hgb A1c MFr Bld 12.3 (H) 4.8 - 5.6 %   Est. average glucose Bld gHb Est-mCnc 306 mg/dL  Basic Metabolic Panel (BMET)  Result Value Ref Range   Glucose 272 (H) 65 - 99 mg/dL   BUN 10 6 - 24 mg/dL   Creatinine, Ser 1.61 0.57 - 1.00 mg/dL   GFR calc non Af Amer 105 >59 mL/min/1.73   GFR calc Af Amer 122 >59 mL/min/1.73   BUN/Creatinine Ratio 16 9 - 23   Sodium 137 134 - 144 mmol/L   Potassium 4.4 3.5 - 5.2 mmol/L   Chloride 99 96 - 106 mmol/L   CO2 25 20 - 29 mmol/L   Calcium 9.0 8.7 - 10.2 mg/dL      Assessment & Plan:   Problem List Items Addressed This Visit      Cardiovascular and Mediastinum   Hypertension    Discussed importance of medication compliance, weight loss, lifestyle modifications. HCTZ and quinapril sent. Follow up in 1 month for recheck      Relevant Medications   quinapril (ACCUPRIL) 20 MG tablet   hydrochlorothiazide (MICROZIDE) 12.5 MG capsule   Other Relevant Orders   Basic Metabolic Panel (BMET) (Completed)     Endocrine   Diabetes mellitus without complication (HCC)    Restart metformin, add glipizide. Compliance stressed, as well as weight loss. Dietician referral offered and declined      Relevant Medications   quinapril (ACCUPRIL) 20 MG tablet   glipiZIDE (GLUCOTROL) 5 MG tablet   metFORMIN (GLUCOPHAGE) 500 MG tablet   Other Relevant Orders   HgB A1c (Completed)     Other   Chronic cough    Will trial steroid inhaler sample and monitor for benefit. Spirometry at next visit if not resolved       Other Visit Diagnoses    Encounter to establish care    -  Primary   Boil of buttock       Relevant Medications   sulfamethoxazole-trimethoprim (BACTRIM DS,SEPTRA DS) 800-160 MG tablet       Follow up plan: Return in about 3 months (around 06/22/2018) for A1C, BP f/u.

## 2018-03-22 NOTE — Patient Instructions (Signed)
DASH Eating Plan DASH stands for "Dietary Approaches to Stop Hypertension." The DASH eating plan is a healthy eating plan that has been shown to reduce high blood pressure (hypertension). It may also reduce your risk for type 2 diabetes, heart disease, and stroke. The DASH eating plan may also help with weight loss. What are tips for following this plan? General guidelines  Avoid eating more than 2,300 mg (milligrams) of salt (sodium) a day. If you have hypertension, you may need to reduce your sodium intake to 1,500 mg a day.  Limit alcohol intake to no more than 1 drink a day for nonpregnant women and 2 drinks a day for men. One drink equals 12 oz of beer, 5 oz of wine, or 1 oz of hard liquor.  Work with your health care provider to maintain a healthy body weight or to lose weight. Ask what an ideal weight is for you.  Get at least 30 minutes of exercise that causes your heart to beat faster (aerobic exercise) most days of the week. Activities may include walking, swimming, or biking.  Work with your health care provider or diet and nutrition specialist (dietitian) to adjust your eating plan to your individual calorie needs. Reading food labels  Check food labels for the amount of sodium per serving. Choose foods with less than 5 percent of the Daily Value of sodium. Generally, foods with less than 300 mg of sodium per serving fit into this eating plan.  To find whole grains, look for the word "whole" as the first word in the ingredient list. Shopping  Buy products labeled as "low-sodium" or "no salt added."  Buy fresh foods. Avoid canned foods and premade or frozen meals. Cooking  Avoid adding salt when cooking. Use salt-free seasonings or herbs instead of table salt or sea salt. Check with your health care provider or pharmacist before using salt substitutes.  Do not fry foods. Cook foods using healthy methods such as baking, boiling, grilling, and broiling instead.  Cook with  heart-healthy oils, such as olive, canola, soybean, or sunflower oil. Meal planning   Eat a balanced diet that includes: ? 5 or more servings of fruits and vegetables each day. At each meal, try to fill half of your plate with fruits and vegetables. ? Up to 6-8 servings of whole grains each day. ? Less than 6 oz of lean meat, poultry, or fish each day. A 3-oz serving of meat is about the same size as a deck of cards. One egg equals 1 oz. ? 2 servings of low-fat dairy each day. ? A serving of nuts, seeds, or beans 5 times each week. ? Heart-healthy fats. Healthy fats called Omega-3 fatty acids are found in foods such as flaxseeds and coldwater fish, like sardines, salmon, and mackerel.  Limit how much you eat of the following: ? Canned or prepackaged foods. ? Food that is high in trans fat, such as fried foods. ? Food that is high in saturated fat, such as fatty meat. ? Sweets, desserts, sugary drinks, and other foods with added sugar. ? Full-fat dairy products.  Do not salt foods before eating.  Try to eat at least 2 vegetarian meals each week.  Eat more home-cooked food and less restaurant, buffet, and fast food.  When eating at a restaurant, ask that your food be prepared with less salt or no salt, if possible. What foods are recommended? The items listed may not be a complete list. Talk with your dietitian about what   dietary choices are best for you. Grains Whole-grain or whole-wheat bread. Whole-grain or whole-wheat pasta. Brown rice. Oatmeal. Quinoa. Bulgur. Whole-grain and low-sodium cereals. Pita bread. Low-fat, low-sodium crackers. Whole-wheat flour tortillas. Vegetables Fresh or frozen vegetables (raw, steamed, roasted, or grilled). Low-sodium or reduced-sodium tomato and vegetable juice. Low-sodium or reduced-sodium tomato sauce and tomato paste. Low-sodium or reduced-sodium canned vegetables. Fruits All fresh, dried, or frozen fruit. Canned fruit in natural juice (without  added sugar). Meat and other protein foods Skinless chicken or turkey. Ground chicken or turkey. Pork with fat trimmed off. Fish and seafood. Egg whites. Dried beans, peas, or lentils. Unsalted nuts, nut butters, and seeds. Unsalted canned beans. Lean cuts of beef with fat trimmed off. Low-sodium, lean deli meat. Dairy Low-fat (1%) or fat-free (skim) milk. Fat-free, low-fat, or reduced-fat cheeses. Nonfat, low-sodium ricotta or cottage cheese. Low-fat or nonfat yogurt. Low-fat, low-sodium cheese. Fats and oils Soft margarine without trans fats. Vegetable oil. Low-fat, reduced-fat, or light mayonnaise and salad dressings (reduced-sodium). Canola, safflower, olive, soybean, and sunflower oils. Avocado. Seasoning and other foods Herbs. Spices. Seasoning mixes without salt. Unsalted popcorn and pretzels. Fat-free sweets. What foods are not recommended? The items listed may not be a complete list. Talk with your dietitian about what dietary choices are best for you. Grains Baked goods made with fat, such as croissants, muffins, or some breads. Dry pasta or rice meal packs. Vegetables Creamed or fried vegetables. Vegetables in a cheese sauce. Regular canned vegetables (not low-sodium or reduced-sodium). Regular canned tomato sauce and paste (not low-sodium or reduced-sodium). Regular tomato and vegetable juice (not low-sodium or reduced-sodium). Pickles. Olives. Fruits Canned fruit in a light or heavy syrup. Fried fruit. Fruit in cream or butter sauce. Meat and other protein foods Fatty cuts of meat. Ribs. Fried meat. Bacon. Sausage. Bologna and other processed lunch meats. Salami. Fatback. Hotdogs. Bratwurst. Salted nuts and seeds. Canned beans with added salt. Canned or smoked fish. Whole eggs or egg yolks. Chicken or turkey with skin. Dairy Whole or 2% milk, cream, and half-and-half. Whole or full-fat cream cheese. Whole-fat or sweetened yogurt. Full-fat cheese. Nondairy creamers. Whipped toppings.  Processed cheese and cheese spreads. Fats and oils Butter. Stick margarine. Lard. Shortening. Ghee. Bacon fat. Tropical oils, such as coconut, palm kernel, or palm oil. Seasoning and other foods Salted popcorn and pretzels. Onion salt, garlic salt, seasoned salt, table salt, and sea salt. Worcestershire sauce. Tartar sauce. Barbecue sauce. Teriyaki sauce. Soy sauce, including reduced-sodium. Steak sauce. Canned and packaged gravies. Fish sauce. Oyster sauce. Cocktail sauce. Horseradish that you find on the shelf. Ketchup. Mustard. Meat flavorings and tenderizers. Bouillon cubes. Hot sauce and Tabasco sauce. Premade or packaged marinades. Premade or packaged taco seasonings. Relishes. Regular salad dressings. Where to find more information:  National Heart, Lung, and Blood Institute: www.nhlbi.nih.gov  American Heart Association: www.heart.org Summary  The DASH eating plan is a healthy eating plan that has been shown to reduce high blood pressure (hypertension). It may also reduce your risk for type 2 diabetes, heart disease, and stroke.  With the DASH eating plan, you should limit salt (sodium) intake to 2,300 mg a day. If you have hypertension, you may need to reduce your sodium intake to 1,500 mg a day.  When on the DASH eating plan, aim to eat more fresh fruits and vegetables, whole grains, lean proteins, low-fat dairy, and heart-healthy fats.  Work with your health care provider or diet and nutrition specialist (dietitian) to adjust your eating plan to your individual   calorie needs. This information is not intended to replace advice given to you by your health care provider. Make sure you discuss any questions you have with your health care provider. Document Released: 05/26/2011 Document Revised: 05/30/2016 Document Reviewed: 05/30/2016 Elsevier Interactive Patient Education  2018 Elsevier Inc.  Diabetes Mellitus and Nutrition When you have diabetes (diabetes mellitus), it is very  important to have healthy eating habits because your blood sugar (glucose) levels are greatly affected by what you eat and drink. Eating healthy foods in the appropriate amounts, at about the same times every day, can help you:  Control your blood glucose.  Lower your risk of heart disease.  Improve your blood pressure.  Reach or maintain a healthy weight.  Every person with diabetes is different, and each person has different needs for a meal plan. Your health care provider may recommend that you work with a diet and nutrition specialist (dietitian) to make a meal plan that is best for you. Your meal plan may vary depending on factors such as:  The calories you need.  The medicines you take.  Your weight.  Your blood glucose, blood pressure, and cholesterol levels.  Your activity level.  Other health conditions you have, such as heart or kidney disease.  How do carbohydrates affect me? Carbohydrates affect your blood glucose level more than any other type of food. Eating carbohydrates naturally increases the amount of glucose in your blood. Carbohydrate counting is a method for keeping track of how many carbohydrates you eat. Counting carbohydrates is important to keep your blood glucose at a healthy level, especially if you use insulin or take certain oral diabetes medicines. It is important to know how many carbohydrates you can safely have in each meal. This is different for every person. Your dietitian can help you calculate how many carbohydrates you should have at each meal and for snack. Foods that contain carbohydrates include:  Bread, cereal, rice, pasta, and crackers.  Potatoes and corn.  Peas, beans, and lentils.  Milk and yogurt.  Fruit and juice.  Desserts, such as cakes, cookies, ice cream, and candy.  How does alcohol affect me? Alcohol can cause a sudden decrease in blood glucose (hypoglycemia), especially if you use insulin or take certain oral diabetes  medicines. Hypoglycemia can be a life-threatening condition. Symptoms of hypoglycemia (sleepiness, dizziness, and confusion) are similar to symptoms of having too much alcohol. If your health care provider says that alcohol is safe for you, follow these guidelines:  Limit alcohol intake to no more than 1 drink per day for nonpregnant women and 2 drinks per day for men. One drink equals 12 oz of beer, 5 oz of wine, or 1 oz of hard liquor.  Do not drink on an empty stomach.  Keep yourself hydrated with water, diet soda, or unsweetened iced tea.  Keep in mind that regular soda, juice, and other mixers may contain a lot of sugar and must be counted as carbohydrates.  What are tips for following this plan? Reading food labels  Start by checking the serving size on the label. The amount of calories, carbohydrates, fats, and other nutrients listed on the label are based on one serving of the food. Many foods contain more than one serving per package.  Check the total grams (g) of carbohydrates in one serving. You can calculate the number of servings of carbohydrates in one serving by dividing the total carbohydrates by 15. For example, if a food has 30 g of total   carbohydrates, it would be equal to 2 servings of carbohydrates.  Check the number of grams (g) of saturated and trans fats in one serving. Choose foods that have low or no amount of these fats.  Check the number of milligrams (mg) of sodium in one serving. Most people should limit total sodium intake to less than 2,300 mg per day.  Always check the nutrition information of foods labeled as "low-fat" or "nonfat". These foods may be higher in added sugar or refined carbohydrates and should be avoided.  Talk to your dietitian to identify your daily goals for nutrients listed on the label. Shopping  Avoid buying canned, premade, or processed foods. These foods tend to be high in fat, sodium, and added sugar.  Shop around the outside edge  of the grocery store. This includes fresh fruits and vegetables, bulk grains, fresh meats, and fresh dairy. Cooking  Use low-heat cooking methods, such as baking, instead of high-heat cooking methods like deep frying.  Cook using healthy oils, such as olive, canola, or sunflower oil.  Avoid cooking with butter, cream, or high-fat meats. Meal planning  Eat meals and snacks regularly, preferably at the same times every day. Avoid going long periods of time without eating.  Eat foods high in fiber, such as fresh fruits, vegetables, beans, and whole grains. Talk to your dietitian about how many servings of carbohydrates you can eat at each meal.  Eat 4-6 ounces of lean protein each day, such as lean meat, chicken, fish, eggs, or tofu. 1 ounce is equal to 1 ounce of meat, chicken, or fish, 1 egg, or 1/4 cup of tofu.  Eat some foods each day that contain healthy fats, such as avocado, nuts, seeds, and fish. Lifestyle   Check your blood glucose regularly.  Exercise at least 30 minutes 5 or more days each week, or as told by your health care provider.  Take medicines as told by your health care provider.  Do not use any products that contain nicotine or tobacco, such as cigarettes and e-cigarettes. If you need help quitting, ask your health care provider.  Work with a counselor or diabetes educator to identify strategies to manage stress and any emotional and social challenges. What are some questions to ask my health care provider?  Do I need to meet with a diabetes educator?  Do I need to meet with a dietitian?  What number can I call if I have questions?  When are the best times to check my blood glucose? Where to find more information:  American Diabetes Association: diabetes.org/food-and-fitness/food  Academy of Nutrition and Dietetics: www.eatright.org/resources/health/diseases-and-conditions/diabetes  National Institute of Diabetes and Digestive and Kidney Diseases (NIH):  www.niddk.nih.gov/health-information/diabetes/overview/diet-eating-physical-activity Summary  A healthy meal plan will help you control your blood glucose and maintain a healthy lifestyle.  Working with a diet and nutrition specialist (dietitian) can help you make a meal plan that is best for you.  Keep in mind that carbohydrates and alcohol have immediate effects on your blood glucose levels. It is important to count carbohydrates and to use alcohol carefully. This information is not intended to replace advice given to you by your health care provider. Make sure you discuss any questions you have with your health care provider. Document Released: 03/03/2005 Document Revised: 07/11/2016 Document Reviewed: 07/11/2016 Elsevier Interactive Patient Education  2018 Elsevier Inc.  

## 2018-03-23 LAB — BASIC METABOLIC PANEL
BUN / CREAT RATIO: 16 (ref 9–23)
BUN: 10 mg/dL (ref 6–24)
CALCIUM: 9 mg/dL (ref 8.7–10.2)
CHLORIDE: 99 mmol/L (ref 96–106)
CO2: 25 mmol/L (ref 20–29)
CREATININE: 0.62 mg/dL (ref 0.57–1.00)
GFR calc Af Amer: 122 mL/min/{1.73_m2} (ref >59)
GFR calc non Af Amer: 105 mL/min/{1.73_m2} (ref >59)
GLUCOSE: 272 mg/dL — AB (ref 65–99)
Potassium: 4.4 mmol/L (ref 3.5–5.2)
Sodium: 137 mmol/L (ref 134–144)

## 2018-03-23 LAB — HEMOGLOBIN A1C
ESTIMATED AVERAGE GLUCOSE: 306 mg/dL
Hgb A1c MFr Bld: 12.3 % — ABNORMAL HIGH (ref 4.8–5.6)

## 2018-04-19 NOTE — Assessment & Plan Note (Signed)
Will trial steroid inhaler sample and monitor for benefit. Spirometry at next visit if not resolved

## 2018-04-19 NOTE — Assessment & Plan Note (Signed)
Discussed importance of medication compliance, weight loss, lifestyle modifications. HCTZ and quinapril sent. Follow up in 1 month for recheck

## 2018-04-19 NOTE — Assessment & Plan Note (Signed)
Restart metformin, add glipizide. Compliance stressed, as well as weight loss. Dietician referral offered and declined

## 2018-04-27 IMAGING — CR DG CHEST 2V
1 series · 2 of 2 positions shown · non-contrast
Comparison: CT chest 09/10/2014

CLINICAL DATA: Cough, congestion

EXAM:
CHEST  2 VIEW

[Series 1: dg chest 2 view · 0.14mm/px · 2 of 2 slices shown]
[im 1/2]
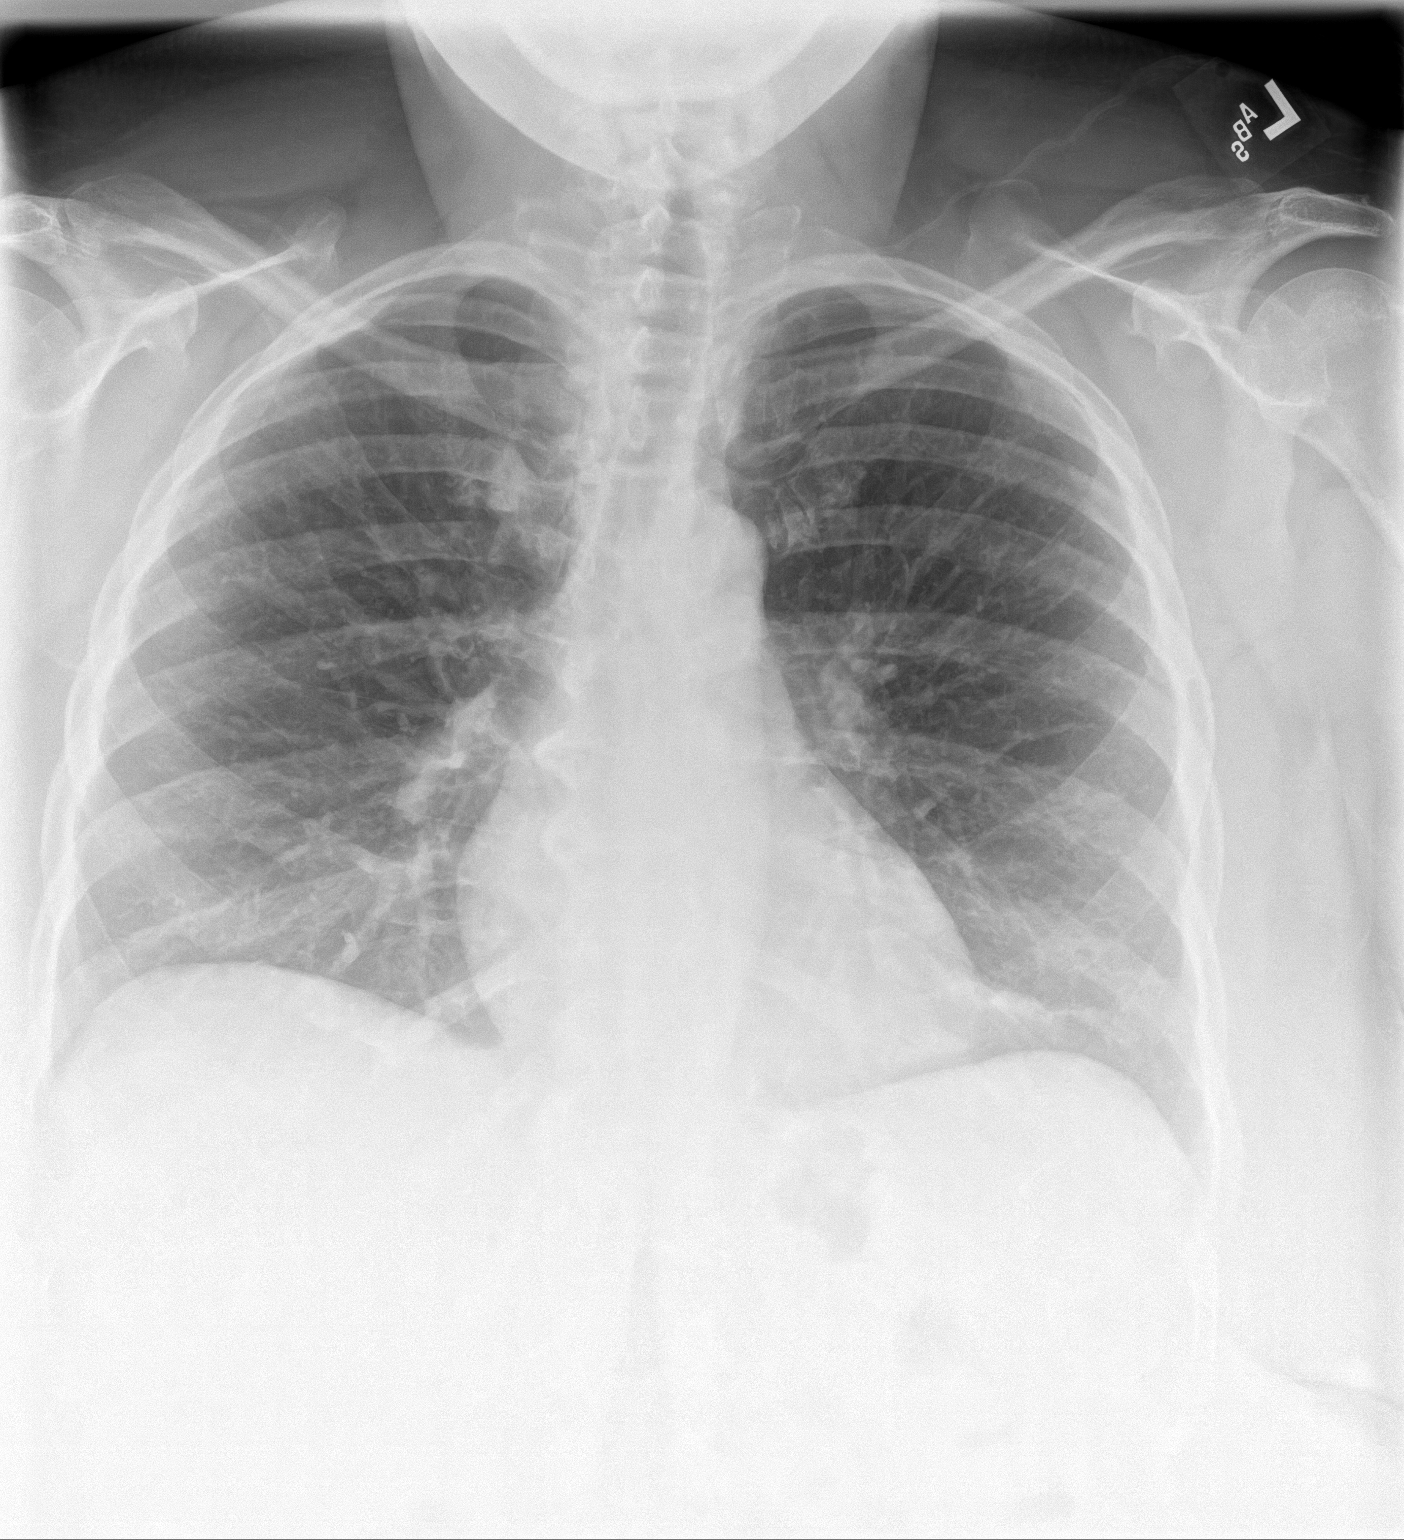
[im 2/2]
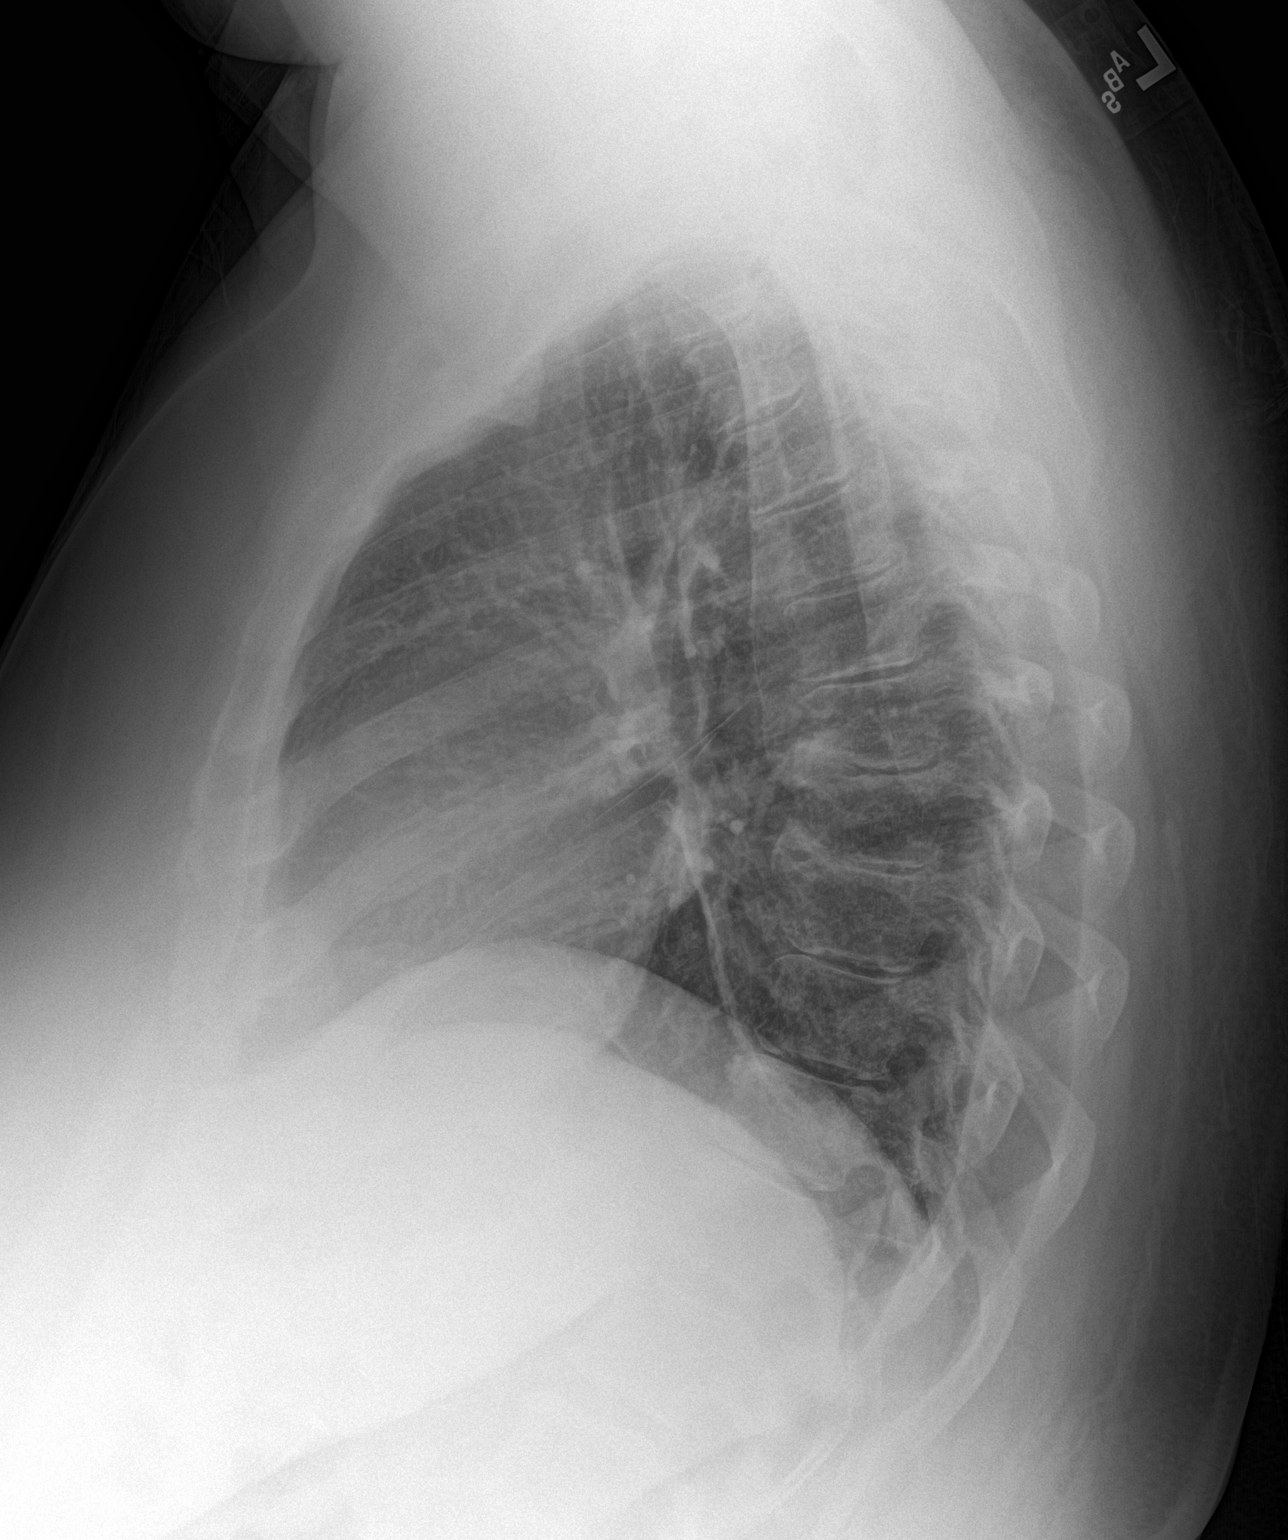

[2 of 2 positions shown; findings below may reference images not displayed]

FINDINGS: There is a small linear band of airspace disease at the left lung
base likely reflecting atelectasis versus scarring. There is no
focal parenchymal opacity. There is no pleural effusion or
pneumothorax. The heart and mediastinal contours are unremarkable.

The osseous structures are unremarkable.
IMPRESSION: No active cardiopulmonary disease.

## 2018-06-25 ENCOUNTER — Encounter: Payer: Self-pay | Admitting: Family Medicine

## 2018-06-25 ENCOUNTER — Ambulatory Visit (INDEPENDENT_AMBULATORY_CARE_PROVIDER_SITE_OTHER): Payer: Managed Care, Other (non HMO) | Admitting: Family Medicine

## 2018-06-25 VITALS — BP 150/92 | HR 100 | Temp 98.5°F | Ht 64.5 in | Wt 318.1 lb

## 2018-06-25 DIAGNOSIS — Z23 Encounter for immunization: Secondary | ICD-10-CM

## 2018-06-25 DIAGNOSIS — E119 Type 2 diabetes mellitus without complications: Secondary | ICD-10-CM

## 2018-06-25 DIAGNOSIS — I10 Essential (primary) hypertension: Secondary | ICD-10-CM | POA: Diagnosis not present

## 2018-06-25 DIAGNOSIS — L609 Nail disorder, unspecified: Secondary | ICD-10-CM | POA: Diagnosis not present

## 2018-06-25 DIAGNOSIS — Z1211 Encounter for screening for malignant neoplasm of colon: Secondary | ICD-10-CM

## 2018-06-25 DIAGNOSIS — Z1239 Encounter for other screening for malignant neoplasm of breast: Secondary | ICD-10-CM

## 2018-06-25 MED ORDER — GABAPENTIN 300 MG PO CAPS: 300.0000 mg | ORAL_CAPSULE | Freq: Three times a day (TID) | ORAL | 2 refills | Status: DC | PRN

## 2018-06-25 MED ORDER — DICLOFENAC SODIUM 1 % TD GEL: 2.0000 g | Freq: Four times a day (QID) | TRANSDERMAL | 3 refills | Status: DC

## 2018-06-25 MED ORDER — HYDROCHLOROTHIAZIDE 25 MG PO TABS: 25.0000 mg | ORAL_TABLET | Freq: Every day | ORAL | 0 refills | Status: DC

## 2018-06-25 NOTE — Progress Notes (Signed)
BP (!) 150/92   Pulse 100   Temp 98.5 F (36.9 C)   Ht 5' 4.5" (1.638 m)   Wt (!) 318 lb 2 oz (144.3 kg)   SpO2 100%   BMI 53.76 kg/m    Subjective:    Patient ID: Yesenia Stevens, female    DOB: 11/06/1965, 53 y.o.   MRN: [id number]  HPI: Yesenia BucksJeannette Stevens is a 53 y.o. female  Chief Complaint  Patient presents with  . Diabetes  . Hypertension   Left great toenail pain that hurts when the sheet presses down on her toenail for a few months. Does have some nail thickening. No injury, cuts, calluses. Typically gets pedicures for nail trimmings and foot care.   Right thumb pain and locking up. Stiff in the mornings but seems to let up some as she goes during the day. Taking OTC anti inflammatories with some temporary relief. No redness, swelling, injury.   Taking quinapril 20 mg and HCTZ 12.5 mg without issue. Does not check home BPs. Denies CP, SOB, dizziness, HAs.   Taking glipizide and metformin for her diabetes. No low blood sugar spells. Trying to work on diet, does not exercise regularly.   Past Medical History:  Diagnosis Date  . Diabetes mellitus without complication (HCC)   . Hypertension    Social History   Socioeconomic History  . Marital status: Single    Spouse name: Not on file  . Number of children: Not on file  . Years of education: Not on file  . Highest education level: Not on file  Occupational History  . Not on file  Social Needs  . Financial resource strain: Not on file  . Food insecurity:    Worry: Not on file    Inability: Not on file  . Transportation needs:    Medical: Not on file    Non-medical: Not on file  Tobacco Use  . Smoking status: Never Smoker  . Smokeless tobacco: Never Used  Substance and Sexual Activity  . Alcohol use: Never    Alcohol/week: 0.0 standard drinks    Frequency: Never  . Drug use: Never  . Sexual activity: Not Currently  Lifestyle  . Physical activity:    Days per week: Not on file    Minutes per  session: Not on file  . Stress: Not on file  Relationships  . Social connections:    Talks on phone: Not on file    Gets together: Not on file    Attends religious service: Not on file    Active member of club or organization: Not on file    Attends meetings of clubs or organizations: Not on file    Relationship status: Not on file  . Intimate partner violence:    Fear of current or ex partner: Not on file    Emotionally abused: Not on file    Physically abused: Not on file    Forced sexual activity: Not on file  Other Topics Concern  . Not on file  Social History Narrative  . Not on file    Relevant past medical, surgical, family and social history reviewed and updated as indicated. Interim medical history since our last visit reviewed. Allergies and medications reviewed and updated.  Review of Systems  Per HPI unless specifically indicated above     Objective:    BP (!) 150/92   Pulse 100   Temp 98.5 F (36.9 C)   Ht 5' 4.5" (1.638 m)  Wt (!) 318 lb 2 oz (144.3 kg)   SpO2 100%   BMI 53.76 kg/m   Wt Readings from Last 3 Encounters:  06/25/18 (!) 318 lb 2 oz (144.3 kg)  03/22/18 (!) 315 lb (142.9 kg)  10/10/16 (!) 314 lb (142.4 kg)    Physical Exam Vitals signs and nursing note reviewed.  Constitutional:      Appearance: Normal appearance. She is not ill-appearing.  HENT:     Head: Atraumatic.  Eyes:     Extraocular Movements: Extraocular movements intact.     Conjunctiva/sclera: Conjunctivae normal.  Neck:     Musculoskeletal: Normal range of motion and neck supple.  Cardiovascular:     Rate and Rhythm: Normal rate and regular rhythm.     Heart sounds: Normal heart sounds.  Pulmonary:     Effort: Pulmonary effort is normal.     Breath sounds: Normal breath sounds.  Musculoskeletal: Normal range of motion.  Skin:    General: Skin is warm and dry.     Comments: Diffuse toenail thickening  Great toenails nontender to palpation  Neurological:      Mental Status: She is alert and oriented to person, place, and time.  Psychiatric:        Mood and Affect: Mood normal.        Thought Content: Thought content normal.        Judgment: Judgment normal.     Results for orders placed or performed in visit on 06/25/18  Bayer DCA Hb A1c Waived  Result Value Ref Range   HB A1C (BAYER DCA - WAIVED) 9.8 (H) <7.0 %  Basic Metabolic Panel (BMET)  Result Value Ref Range   Glucose 210 (H) 65 - 99 mg/dL   BUN 17 6 - 24 mg/dL   Creatinine, Ser 1.22 0.57 - 1.00 mg/dL   GFR calc non Af Amer 89 >59 mL/min/1.73   GFR calc Af Amer 103 >59 mL/min/1.73   BUN/Creatinine Ratio 22 9 - 23   Sodium 136 134 - 144 mmol/L   Potassium 4.0 3.5 - 5.2 mmol/L   Chloride 98 96 - 106 mmol/L   CO2 26 20 - 29 mmol/L   Calcium 9.3 8.7 - 10.2 mg/dL      Assessment & Plan:   Problem List Items Addressed This Visit      Cardiovascular and Mediastinum   Hypertension    Not to goal, increase HCTZ to 25 mg and start home monitoring. DASH diet, exercise      Relevant Medications   hydrochlorothiazide (HYDRODIURIL) 25 MG tablet     Endocrine   Diabetes mellitus without complication (HCC) - Primary    Recheck A1C and adjust as needed. Diet and exercise, weight loss reviewed      Relevant Orders   Bayer DCA Hb A1c Waived (Completed)   Basic Metabolic Panel (BMET) (Completed)    Other Visit Diagnoses    Nail abnormality       Referral to podiatry sent for nail care and great toenail pain   Relevant Orders   Ambulatory referral to Podiatry   Screening for breast cancer       Relevant Orders   MM DIGITAL SCREENING BILATERAL   Screening for colon cancer       Relevant Orders   Ambulatory referral to Gastroenterology   Immunization due           Follow up plan: Return in about 3 months (around 09/24/2018) for A1C, BP f/u.

## 2018-06-25 NOTE — Patient Instructions (Signed)
[facility]  Address: [address], [place], [place] [address]  Phone: [phone]  

## 2018-06-26 LAB — BASIC METABOLIC PANEL
BUN/Creatinine Ratio: 22 (ref 9–23)
BUN: 17 mg/dL (ref 6–24)
CO2: 26 mmol/L (ref 20–29)
Calcium: 9.3 mg/dL (ref 8.7–10.2)
Chloride: 98 mmol/L (ref 96–106)
Creatinine, Ser: 0.78 mg/dL (ref 0.57–1.00)
GFR calc Af Amer: 103 mL/min/{1.73_m2} (ref >59)
GFR calc non Af Amer: 89 mL/min/{1.73_m2} (ref >59)
Glucose: 210 mg/dL — ABNORMAL HIGH (ref 65–99)
Potassium: 4 mmol/L (ref 3.5–5.2)
Sodium: 136 mmol/L (ref 134–144)

## 2018-06-26 LAB — BAYER DCA HB A1C WAIVED: HB A1C (BAYER DCA - WAIVED): 9.8 % — ABNORMAL HIGH (ref <7.0)

## 2018-06-27 ENCOUNTER — Other Ambulatory Visit: Payer: Self-pay | Admitting: Family Medicine

## 2018-06-27 MED ORDER — EMPAGLIFLOZIN 10 MG PO TABS: 10.0000 mg | ORAL_TABLET | Freq: Every day | ORAL | 3 refills | Status: DC

## 2018-06-27 MED ORDER — METFORMIN HCL 1000 MG PO TABS: 1000.0000 mg | ORAL_TABLET | Freq: Two times a day (BID) | ORAL | 3 refills | Status: DC

## 2018-06-27 NOTE — Assessment & Plan Note (Signed)
Not to goal, increase HCTZ to 25 mg and start home monitoring. DASH diet, exercise

## 2018-06-27 NOTE — Assessment & Plan Note (Signed)
Recheck A1C and adjust as needed. Diet and exercise, weight loss reviewed

## 2018-06-28 ENCOUNTER — Telehealth: Payer: Self-pay | Admitting: Family Medicine

## 2018-06-28 ENCOUNTER — Ambulatory Visit: Payer: Self-pay | Admitting: Family Medicine

## 2018-06-28 NOTE — Telephone Encounter (Signed)
Called and spoke to [employer]. Pt does not have any insurance on file. Called pt and let her know. She will go today to give them her insurance information. She will call back if it is still too expensive once she files it w/ insurance.

## 2018-06-28 NOTE — Telephone Encounter (Signed)
Can we find out if they prefer invokana or farxiga instead? She has Nurse, learning disability so the coverage should be there on these. There are also savings cards. If we have to, we can switch to [place] or something similar that should be cheaper.

## 2018-06-28 NOTE — Telephone Encounter (Signed)
Copied from CRM [id number]. Topic: Quick Communication - See Telephone Encounter >> Jun 28, 2018  9:16 AM Arlyss Gandy, NT wrote: CRM for notification. See Telephone encounter for: 06/28/18. Pt states that she cannot afford the empagliflozin (JARDIANCE) 10 MG TABS tablet and that the dr can cancel the order or may send something else in. The medicine was going to cost her over $500.

## 2018-07-06 ENCOUNTER — Other Ambulatory Visit (HOSPITAL_COMMUNITY)
Admission: RE | Admit: 2018-07-06 | Discharge: 2018-07-06 | Disposition: A | Discharge status: Home / Self Care | Payer: Managed Care, Other (non HMO) | Source: Ambulatory Visit | Attending: Family Medicine | Admitting: Family Medicine

## 2018-07-06 ENCOUNTER — Ambulatory Visit (INDEPENDENT_AMBULATORY_CARE_PROVIDER_SITE_OTHER): Payer: Managed Care, Other (non HMO) | Admitting: Family Medicine

## 2018-07-06 ENCOUNTER — Encounter: Payer: Self-pay | Admitting: Family Medicine

## 2018-07-06 VITALS — BP 139/86 | HR 91 | Temp 98.5°F | Ht 64.5 in | Wt 322.2 lb

## 2018-07-06 DIAGNOSIS — Z1211 Encounter for screening for malignant neoplasm of colon: Secondary | ICD-10-CM

## 2018-07-06 DIAGNOSIS — Z124 Encounter for screening for malignant neoplasm of cervix: Secondary | ICD-10-CM | POA: Diagnosis not present

## 2018-07-06 DIAGNOSIS — R002 Palpitations: Secondary | ICD-10-CM | POA: Diagnosis not present

## 2018-07-06 DIAGNOSIS — Z0000 Encounter for general adult medical examination without abnormal findings: Secondary | ICD-10-CM

## 2018-07-06 LAB — UA/M W/RFLX CULTURE, ROUTINE
Bilirubin, UA: NEGATIVE
Ketones, UA: NEGATIVE
Leukocytes, UA: NEGATIVE
Nitrite, UA: NEGATIVE
Specific Gravity, UA: >1.03 — ABNORMAL HIGH (ref 1.005–1.030)
Urobilinogen, Ur: 2 mg/dL — ABNORMAL HIGH (ref 0.2–1.0)
pH, UA: 5.5 (ref 5.0–7.5)

## 2018-07-06 LAB — MICROSCOPIC EXAMINATION
Bacteria, UA: NONE SEEN
RBC, UA: NONE SEEN /hpf (ref 0–2)

## 2018-07-06 NOTE — Progress Notes (Signed)
BP 139/86 (BP Location: Right Arm, Cuff Size: Large)   Pulse 91   Temp 98.5 F (36.9 C) (Oral)   Ht 5' 4.5" (1.638 m)   Wt (!) 322 lb 3.2 oz (146.1 kg)   SpO2 97%   BMI 54.45 kg/m    Subjective:    Patient ID: Yesenia Stevens, female    DOB: 01/21/1966, 53 y.o.   MRN: [id number]  HPI: Yesenia Stevens is a 53 y.o. female presenting on 07/06/2018 for comprehensive medical examination. Current medical complaints include:none  Was seen for HTN and DM f/u last week.   Has been dealing with intermittent palpitations the past few years, no established pattern to them. Has been evaluated in the past in the ER, most recently 09/2016 for this with EKG showing non-specific ST changes but otherwise normal. Notes from ER and results reviewed. Was told at that time she should f/u with Cardiology for further evaluation and stress test but did not. Very concerned about these at this point so willing to be referred to Cardiology.   She currently lives with: Menopausal Symptoms: no  Depression Screen done today and results listed below:  [facility]2018  Decreased Interest 0  Down, Depressed, Hopeless 0  PHQ - 2 Score 0  Altered sleeping 0  Tired, decreased energy 0  Change in appetite 0  Feeling bad or failure about yourself  0  Trouble concentrating 0  Moving slowly or fidgety/restless 0  Suicidal thoughts 0  PHQ-9 Score 0  Difficult doing work/chores Not difficult at all    The patient does not have a history of falls. I did complete a risk assessment for falls. A plan of care for falls was documented.   Past Medical History:  Past Medical History:  Diagnosis Date  . Diabetes mellitus without complication (HCC)   . Hypertension     Surgical History:  Past Surgical History:  Procedure Laterality Date  . CESAREAN SECTION    . CHOLECYSTECTOMY      Medications:  Current Outpatient Medications on File Prior to Visit  Medication Sig  . albuterol  (PROVENTIL HFA;VENTOLIN HFA) 108 (90 Base) MCG/ACT inhaler Inhale 1-2 puffs into the lungs every 6 (six) hours as needed for wheezing or shortness of breath.  . gabapentin (NEURONTIN) 300 MG capsule Take 1 capsule (300 mg total) by mouth 3 (three) times daily as needed.  Marland Kitchen. glipiZIDE (GLUCOTROL) 5 MG tablet Take 1 tablet (5 mg total) by mouth 2 (two) times daily.  . hydrochlorothiazide (HYDRODIURIL) 25 MG tablet Take 1 tablet (25 mg total) by mouth daily.  . metFORMIN (GLUCOPHAGE) 1000 MG tablet Take 1 tablet (1,000 mg total) by mouth 2 (two) times daily with a meal.  . quinapril (ACCUPRIL) 20 MG tablet Take 1 tablet (20 mg total) by mouth at bedtime.  . diclofenac sodium (VOLTAREN) 1 % GEL Apply 2 g topically 4 (four) times daily. (Patient not taking: Reported on 07/06/2018)  . empagliflozin (JARDIANCE) 10 MG TABS tablet Take 10 mg by mouth daily. (Patient not taking: Reported on 07/06/2018)   No current facility-administered medications on file prior to visit.     Allergies:  No Known Allergies  Social History:  Social History   Socioeconomic History  . Marital status: Single    Spouse name: Not on file  . Number of children: Not on file  . Years of education: Not on file  . Highest education level: Not on file  Occupational History  . Not  on file  Social Needs  . Financial resource strain: Not on file  . Food insecurity:    Worry: Not on file    Inability: Not on file  . Transportation needs:    Medical: Not on file    Non-medical: Not on file  Tobacco Use  . Smoking status: Never Smoker  . Smokeless tobacco: Never Used  Substance and Sexual Activity  . Alcohol use: Never    Alcohol/week: 0.0 standard drinks    Frequency: Never  . Drug use: Never  . Sexual activity: Not Currently  Lifestyle  . Physical activity:    Days per week: Not on file    Minutes per session: Not on file  . Stress: Not on file  Relationships  . Social connections:    Talks on phone: Not on file     Gets together: Not on file    Attends religious service: Not on file    Active member of club or organization: Not on file    Attends meetings of clubs or organizations: Not on file    Relationship status: Not on file  . Intimate partner violence:    Fear of current or ex partner: Not on file    Emotionally abused: Not on file    Physically abused: Not on file    Forced sexual activity: Not on file  Other Topics Concern  . Not on file  Social History Narrative  . Not on file   Social History   Tobacco Use  Smoking Status Never Smoker  Smokeless Tobacco Never Used   Social History   Substance and Sexual Activity  Alcohol Use Never  . Alcohol/week: 0.0 standard drinks  . Frequency: Never    Family History:  Family History  Problem Relation Age of Onset  . Hypertension Mother   . Diabetes Mother     Past medical history, surgical history, medications, allergies, family history and social history reviewed with patient today and changes made to appropriate areas of the chart.   Review of Systems - General ROS: negative Psychological ROS: negative Ophthalmic ROS: negative ENT ROS: negative Allergy and Immunology ROS: negative Hematological and Lymphatic ROS: negative Endocrine ROS: negative Breast ROS: negative for breast lumps Respiratory ROS: no cough, shortness of breath, or wheezing Cardiovascular ROS: no chest pain or dyspnea on exertion Gastrointestinal ROS: no abdominal pain, change in bowel habits, or black or bloody stools Genito-Urinary ROS: no dysuria, trouble voiding, or hematuria Musculoskeletal ROS: negative Neurological ROS: no TIA or stroke symptoms Dermatological ROS: negative All other ROS negative except what is listed above and in the HPI.      Objective:    BP 139/86 (BP Location: Right Arm, Cuff Size: Large)   Pulse 91   Temp 98.5 F (36.9 C) (Oral)   Ht 5' 4.5" (1.638 m)   Wt (!) 322 lb 3.2 oz (146.1 kg)   SpO2 97%   BMI 54.45  kg/m   Wt Readings from Last 3 Encounters:  07/06/18 (!) 322 lb 3.2 oz (146.1 kg)  06/25/18 (!) 318 lb 2 oz (144.3 kg)  03/22/18 (!) 315 lb (142.9 kg)    Physical Exam Vitals signs and nursing note reviewed.  Constitutional:      General: She is not in acute distress.    Appearance: She is well-developed.  HENT:     Head: Atraumatic.     Right Ear: External ear normal.     Left Ear: External ear normal.  Nose: Nose normal.     Mouth/Throat:     Pharynx: No oropharyngeal exudate.  Eyes:     General: No scleral icterus.    Conjunctiva/sclera: Conjunctivae normal.     Pupils: Pupils are equal, round, and reactive to light.  Neck:     Musculoskeletal: Normal range of motion and neck supple.     Thyroid: No thyromegaly.  Cardiovascular:     Rate and Rhythm: Normal rate and regular rhythm.     Heart sounds: Normal heart sounds.  Pulmonary:     Effort: Pulmonary effort is normal. No respiratory distress.     Breath sounds: Normal breath sounds.  Chest:     Breasts:        Right: No mass, skin change or tenderness.        Left: No mass, skin change or tenderness.  Abdominal:     General: Bowel sounds are normal.     Palpations: Abdomen is soft. There is no mass.     Tenderness: There is no abdominal tenderness.  Genitourinary:    General: Normal vulva.     Vagina: Vaginal discharge present.  Musculoskeletal: Normal range of motion.        General: No tenderness.  Lymphadenopathy:     Cervical: No cervical adenopathy.     Upper Body:     Right upper body: No axillary adenopathy.     Left upper body: No axillary adenopathy.  Skin:    General: Skin is warm and dry.     Findings: No rash.  Neurological:     Mental Status: She is alert and oriented to person, place, and time.     Cranial Nerves: No cranial nerve deficit.  Psychiatric:        Behavior: Behavior normal.     Results for orders placed or performed in visit on 07/06/18  Microscopic Examination  Result  Value Ref Range   WBC, UA 0-5 0 - 5 /hpf   RBC, UA None seen 0 - 2 /hpf   Epithelial Cells (non renal) 0-10 0 - 10 /hpf   Mucus, UA Present Not Estab.   Bacteria, UA None seen None seen/Few  TSH  Result Value Ref Range   TSH 1.370 0.450 - 4.500 uIU/mL  CBC with Differential/Platelet  Result Value Ref Range   WBC 7.4 3.4 - 10.8 x10E3/uL   RBC 4.65 3.77 - 5.28 x10E6/uL   Hemoglobin 11.4 11.1 - 15.9 g/dL   Hematocrit 26.3 78.5 - 46.6 %   MCV 76 (L) 79 - 97 fL   MCH 24.5 (L) 26.6 - 33.0 pg   MCHC 32.1 31.5 - 35.7 g/dL   RDW 88.5 02.7 - 74.1 %   Platelets 347 150 - 450 x10E3/uL   Neutrophils 53 Not Estab. %   Lymphs 38 Not Estab. %   Monocytes 7 Not Estab. %   Eos 2 Not Estab. %   Basos 0 Not Estab. %   Neutrophils Absolute 3.9 1.4 - 7.0 x10E3/uL   Lymphocytes Absolute 2.8 0.7 - 3.1 x10E3/uL   Monocytes Absolute 0.5 0.1 - 0.9 x10E3/uL   EOS (ABSOLUTE) 0.1 0.0 - 0.4 x10E3/uL   Basophils Absolute 0.0 0.0 - 0.2 x10E3/uL   Immature Granulocytes 0 Not Estab. %   Immature Grans (Abs) 0.0 0.0 - 0.1 x10E3/uL  Lipid Panel w/o Chol/HDL Ratio  Result Value Ref Range   Cholesterol, Total 189 100 - 199 mg/dL   Triglycerides 75 0 - 149 mg/dL  HDL 57 >39 mg/dL   VLDL Cholesterol Cal 15 5 - 40 mg/dL   LDL Calculated 229 (H) 0 - 99 mg/dL  UA/M w/rflx Culture, Routine  Result Value Ref Range   Specific Gravity, UA >1.030 (H) 1.005 - 1.030   pH, UA 5.5 5.0 - 7.5   Color, UA Yellow Yellow   Appearance Ur Clear Clear   Leukocytes, UA Negative Negative   Protein, UA Trace (A) Negative/Trace   Glucose, UA Trace (A) Negative   Ketones, UA Negative Negative   RBC, UA Trace (A) Negative   Bilirubin, UA Negative Negative   Urobilinogen, Ur 2.0 (H) 0.2 - 1.0 mg/dL   Nitrite, UA Negative Negative   Microscopic Examination See below:   Cytology - PAP  Result Value Ref Range   Adequacy      Satisfactory for evaluation  endocervical/transformation zone component ABSENT.   Diagnosis       NEGATIVE FOR INTRAEPITHELIAL LESIONS OR MALIGNANCY.   HPV NOT DETECTED    Material Submitted CervicoVaginal Pap [ThinPrep Imaged]       Assessment & Plan:   Problem List Items Addressed This Visit    None    Visit Diagnoses    Palpitations    -  Primary   Wanting to be evaluated for irregular heart beat. Offered low dose bblocker, declines additional medications. Cardiology referral placed   Relevant Orders   Ambulatory referral to Cardiology   Annual physical exam       Encounter for screening colonoscopy       Relevant Orders   Cologuard   TSH (Completed)   CBC with Differential/Platelet (Completed)   Lipid Panel w/o Chol/HDL Ratio (Completed)   UA/M w/rflx Culture, Routine (Completed)   Screening for cervical cancer       Relevant Orders   Cytology - PAP (Completed)       Follow up plan: Return for as scheduled.   LABORATORY TESTING:  - Pap smear: pap done  IMMUNIZATIONS:   - Tdap: Tetanus vaccination status reviewed: last tetanus booster within 10 years. - Influenza: Refused - Pneumovax: Refused - Prevnar: Not applicable - HPV: Not applicable - Zostavax vaccine: Not applicable  SCREENING: -Mammogram: Ordered today  - Colonoscopy: cologuard ordered   PATIENT COUNSELING:   Advised to take 1 mg of folate supplement per day if capable of pregnancy.   Sexuality: Discussed sexually transmitted diseases, partner selection, use of condoms, avoidance of unintended pregnancy  and contraceptive alternatives.   Advised to avoid cigarette smoking.  I discussed with the patient that most people either abstain from alcohol or drink within safe limits (<=14/week and <=4 drinks/occasion for males, <=7/weeks and <= 3 drinks/occasion for females) and that the risk for alcohol disorders and other health effects rises proportionally with the number of drinks per week and how often a drinker exceeds daily limits.  Discussed cessation/primary prevention of drug use and  availability of treatment for abuse.   Diet: Encouraged to adjust caloric intake to maintain  or achieve ideal body weight, to reduce intake of dietary saturated fat and total fat, to limit sodium intake by avoiding high sodium foods and not adding table salt, and to maintain adequate dietary potassium and calcium preferably from fresh fruits, vegetables, and low-fat dairy products.    stressed the importance of regular exercise  Injury prevention: Discussed safety belts, safety helmets, smoke detector, smoking near bedding or upholstery.   Dental health: Discussed importance of regular tooth brushing, flossing, and dental visits.  NEXT PREVENTATIVE PHYSICAL DUE IN 1 YEAR. Return for as scheduled.

## 2018-07-07 LAB — CBC WITH DIFFERENTIAL/PLATELET
Basophils Absolute: 0 10*3/uL (ref 0.0–0.2)
Basos: 0 %
EOS (ABSOLUTE): 0.1 10*3/uL (ref 0.0–0.4)
Eos: 2 %
HEMOGLOBIN: 11.4 g/dL (ref 11.1–15.9)
Hematocrit: 35.5 % (ref 34.0–46.6)
Immature Grans (Abs): 0 10*3/uL (ref 0.0–0.1)
Immature Granulocytes: 0 %
Lymphocytes Absolute: 2.8 10*3/uL (ref 0.7–3.1)
Lymphs: 38 %
MCH: 24.5 pg — ABNORMAL LOW (ref 26.6–33.0)
MCHC: 32.1 g/dL (ref 31.5–35.7)
MCV: 76 fL — ABNORMAL LOW (ref 79–97)
Monocytes Absolute: 0.5 10*3/uL (ref 0.1–0.9)
Monocytes: 7 %
Neutrophils Absolute: 3.9 10*3/uL (ref 1.4–7.0)
Neutrophils: 53 %
Platelets: 347 10*3/uL (ref 150–450)
RBC: 4.65 x10E6/uL (ref 3.77–5.28)
RDW: 13.8 % (ref 11.7–15.4)
WBC: 7.4 10*3/uL (ref 3.4–10.8)

## 2018-07-07 LAB — LIPID PANEL W/O CHOL/HDL RATIO
Cholesterol, Total: 189 mg/dL (ref 100–199)
HDL: 57 mg/dL (ref >39)
LDL Calculated: 117 mg/dL — ABNORMAL HIGH (ref 0–99)
TRIGLYCERIDES: 75 mg/dL (ref 0–149)
VLDL Cholesterol Cal: 15 mg/dL (ref 5–40)

## 2018-07-07 LAB — TSH: TSH: 1.37 u[IU]/mL (ref 0.450–4.500)

## 2018-07-11 LAB — CYTOLOGY - PAP
Adequacy: ABSENT
Diagnosis: NEGATIVE
HPV: NOT DETECTED

## 2018-07-20 ENCOUNTER — Other Ambulatory Visit: Payer: Self-pay | Admitting: Family Medicine

## 2018-07-26 DIAGNOSIS — R0602 Shortness of breath: Secondary | ICD-10-CM | POA: Insufficient documentation

## 2018-07-30 LAB — COLOGUARD: COLOGUARD: NEGATIVE

## 2018-07-31 ENCOUNTER — Ambulatory Visit: Payer: BLUE CROSS/BLUE SHIELD | Admitting: Podiatry

## 2018-08-01 ENCOUNTER — Encounter: Payer: Self-pay | Admitting: Podiatry

## 2018-08-01 ENCOUNTER — Ambulatory Visit (INDEPENDENT_AMBULATORY_CARE_PROVIDER_SITE_OTHER): Payer: Managed Care, Other (non HMO)

## 2018-08-01 ENCOUNTER — Ambulatory Visit: Payer: Managed Care, Other (non HMO) | Admitting: Podiatry

## 2018-08-01 DIAGNOSIS — E1142 Type 2 diabetes mellitus with diabetic polyneuropathy: Secondary | ICD-10-CM

## 2018-08-01 DIAGNOSIS — M7672 Peroneal tendinitis, left leg: Secondary | ICD-10-CM | POA: Diagnosis not present

## 2018-08-01 DIAGNOSIS — L603 Nail dystrophy: Secondary | ICD-10-CM | POA: Diagnosis not present

## 2018-08-01 NOTE — Progress Notes (Signed)
Subjective:  Patient ID: Yesenia Stevens, female    DOB: 07/06/1965,  MRN: [id number] HPI Chief Complaint  Patient presents with  . Nail Problem    Patient presents today with referral from pcp for nail trim.  She also c/o left ankle pain/ weakness.  She states "the pains are sharp and shoots through my foot and then my ankle feels weak and like its gonna give way with me"   She has only been taking Aleve for the discomfort  . Foot Pain    53 y.o. female presents with the above complaint.   ROS: Denies fever chills nausea vomiting muscle aches pains calf pain back pain chest pain shortness of breath.  Past Medical History:  Diagnosis Date  . Diabetes mellitus without complication (HCC)   . Hypertension    Past Surgical History:  Procedure Laterality Date  . CESAREAN SECTION    . CHOLECYSTECTOMY      Current Outpatient Medications:  .  albuterol (PROVENTIL HFA;VENTOLIN HFA) 108 (90 Base) MCG/ACT inhaler, Inhale 1-2 puffs into the lungs every 6 (six) hours as needed for wheezing or shortness of breath., Disp: 1 Inhaler, Rfl: 0 .  diclofenac sodium (VOLTAREN) 1 % GEL, Apply 2 g topically 4 (four) times daily. (Patient not taking: Reported on 07/06/2018), Disp: 100 g, Rfl: 3 .  empagliflozin (JARDIANCE) 10 MG TABS tablet, Take 10 mg by mouth daily. (Patient not taking: Reported on 07/06/2018), Disp: 30 tablet, Rfl: 3 .  gabapentin (NEURONTIN) 300 MG capsule, Take 1 capsule (300 mg total) by mouth 3 (three) times daily as needed., Disp: 90 capsule, Rfl: 2 .  glipiZIDE (GLUCOTROL) 5 MG tablet, Take 1 tablet (5 mg total) by mouth 2 (two) times daily., Disp: 60 tablet, Rfl: 2 .  hydrochlorothiazide (HYDRODIURIL) 25 MG tablet, Take 1 tablet (25 mg total) by mouth daily., Disp: 90 tablet, Rfl: 0 .  metFORMIN (GLUCOPHAGE) 1000 MG tablet, Take 1 tablet (1,000 mg total) by mouth 2 (two) times daily with a meal., Disp: 180 tablet, Rfl: 3 .  quinapril (ACCUPRIL) 20 MG tablet, TAKE 1 TABLET BY  MOUTH AT BEDTIME, Disp: 90 tablet, Rfl: 0  No Known Allergies Review of Systems Objective:  There were no vitals filed for this visit.  General: Well developed, nourished, in no acute distress, alert and oriented x3   Dermatological: Skin is warm, dry and supple bilateral. Nails x 10 are well maintained; remaining integument appears unremarkable at this time. There are no open sores, no preulcerative lesions, no rash or signs of infection present.  Vascular: Dorsalis Pedis artery and Posterior Tibial artery pedal pulses are 2/4 bilateral with immedate capillary fill time. Pedal hair growth present. No varicosities and no lower extremity edema present bilateral.   Neruologic: Grossly intact via light touch bilateral. Vibratory intact via tuning fork bilateral. Protective threshold with Semmes Wienstein monofilament intact to all pedal sites bilateral. Patellar and Achilles deep tendon reflexes 2+ bilateral. No Babinski or clonus noted bilateral.   Musculoskeletal: No gross boney pedal deformities bilateral. No pain, crepitus, or limitation noted with foot and ankle range of motion bilateral. Muscular strength 5/5 in all groups tested bilateral.  She has pain on palpation the sinus tarsi range of motion of the subtalar joint and on palpation of the peroneal tendons.  There is considerable swelling overlying the peroneal tendons.  Gait: Unassisted, Nonantalgic.    Radiographs:  Radiographs taken today demonstrate osteoarthritic changes of the midfoot and pes planus.  Some osteoarthritic change to the  subtalar joint.  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  Significant inflammation of the peroneal tendons cannot rule out a peroneal tendon tear.  Plan: Requesting MRI of the rear foot ankle for surgical evaluation.     Thresea Doble T. [place], [place]

## 2018-08-02 ENCOUNTER — Telehealth: Payer: Self-pay | Admitting: Family Medicine

## 2018-08-02 NOTE — Addendum Note (Signed)
Addended by: Geraldine ContrasVENABLE, ANGELA D on: 08/02/2018 09:57 AM   Modules accepted: Orders

## 2018-08-02 NOTE — Telephone Encounter (Signed)
Copied from CRM [id number]. Topic: General - Other >> Aug 02, 2018  4:24 PM Tamela Oddi wrote: Reason for CRM: Patient called to ask the nurse or doctor if there is a less expensive medication to replace the quinapril (ACCUPRIL) 20 MG tablet, that she can take.  It is too expensive and the insurance will not cover most of the cost.  Please advise and call patient back at [phone]

## 2018-08-03 MED ORDER — LISINOPRIL 20 MG PO TABS: 20.0000 mg | ORAL_TABLET | Freq: Every day | ORAL | 1 refills | Status: DC

## 2018-08-03 NOTE — Telephone Encounter (Signed)
Rx switched to lisinopril which should be on the [facility] 4 dollar list

## 2018-08-06 ENCOUNTER — Other Ambulatory Visit: Payer: Self-pay | Admitting: Family Medicine

## 2018-08-06 NOTE — Addendum Note (Signed)
Addended by: Geraldine Contras D on: 08/06/2018 05:02 PM   Modules accepted: Orders

## 2018-08-06 NOTE — Telephone Encounter (Signed)
Spoke w/ patient she was able to pick up on Saturday.

## 2018-08-06 NOTE — Progress Notes (Unsigned)
This encounter was created in error - please disregard.

## 2018-08-06 NOTE — Telephone Encounter (Signed)
Did anyone get in touch with her last week about this?

## 2018-08-06 NOTE — Telephone Encounter (Signed)
Requested Prescriptions  Pending Prescriptions Disp Refills  . glipiZIDE (GLUCOTROL) 5 MG tablet [Pharmacy Med Name: glipiZIDE 5 MG Oral Tablet] 180 tablet 0    Sig: TAKE 1 TABLET BY MOUTH TWICE DAILY     Endocrinology:  Diabetes - Sulfonylureas Failed - 08/06/2018  5:31 AM      Failed - HBA1C is between 0 and 7.9 and within 180 days    HB A1C (BAYER DCA - WAIVED)  Date Value Ref Range Status  06/25/2018 9.8 (H) <7.0 % Final    Comment:                                          Diabetic Adult            <7.0                                       Healthy Adult        4.3 - 5.7                                                           (DCCT/NGSP) American Diabetes Association's Summary of Glycemic Recommendations for Adults with Diabetes: Hemoglobin A1c <7.0%. More stringent glycemic goals (A1c <6.0%) may further reduce complications at the cost of increased risk of hypoglycemia.          Passed - Valid encounter within last 6 months    Recent Outpatient Visits          1 month ago Palpitations   [facility] [place] Maser [place], [place]   1 month ago Diabetes mellitus without complication [facility])   [facility] [place], [place]   4 months ago Encounter to establish care   [facility], [place], [place]      Future Appointments            In 1 month Maurice March, [place], PA-C [facility], [place]

## 2018-08-20 DIAGNOSIS — I491 Atrial premature depolarization: Secondary | ICD-10-CM | POA: Insufficient documentation

## 2018-08-23 ENCOUNTER — Telehealth: Payer: Self-pay | Admitting: Family Medicine

## 2018-08-23 NOTE — Telephone Encounter (Signed)
Copied from CRM [id number]. Topic: Quick Communication - See Telephone Encounter >> Aug 23, 2018 11:31 AM Jolayne Haines L wrote: CRM for notification. See Telephone encounter for: 08/23/18.  Patient would like to know if you have the colo-guard results back yet?

## 2018-08-23 NOTE — Telephone Encounter (Signed)
Results relayed to patient after printing off online portal. Patient states she does not know why everything comes back normal and she is having so many problem. I asked patient what kind of symptoms she was experience and she stated that she would call us back to discuss at a later time.

## 2018-08-31 ENCOUNTER — Telehealth: Payer: Self-pay | Admitting: Podiatry

## 2018-08-31 NOTE — Telephone Encounter (Signed)
I told pt she would need to have an exam of the right foot prior to MRI orders, Dr. Al Corpus would need to examine to see if a MRI was necessary. Pt states she would like to see Dr. Al Corpus and will cancel the left foot MRI to see if MRI for right could be ordered. Transferred pt to scheduler.

## 2018-08-31 NOTE — Telephone Encounter (Signed)
I have an MRI scheduled of my left foot/ankle on 17 March. I have a knot on my right foot that is causing a lot of pain and wanted to know if I could get that examined at the same time on Tuesday. Please call me back.

## 2018-09-04 ENCOUNTER — Ambulatory Visit: Admission: RE | Admit: 2018-09-04 | Payer: Managed Care, Other (non HMO) | Source: Ambulatory Visit

## 2018-09-12 ENCOUNTER — Ambulatory Visit (INDEPENDENT_AMBULATORY_CARE_PROVIDER_SITE_OTHER): Payer: Managed Care, Other (non HMO) | Admitting: Podiatry

## 2018-09-12 ENCOUNTER — Other Ambulatory Visit: Payer: Self-pay

## 2018-09-12 ENCOUNTER — Ambulatory Visit: Payer: Managed Care, Other (non HMO) | Admitting: Podiatry

## 2018-09-12 ENCOUNTER — Encounter: Payer: Self-pay | Admitting: Podiatry

## 2018-09-12 DIAGNOSIS — M19079 Primary osteoarthritis, unspecified ankle and foot: Secondary | ICD-10-CM

## 2018-09-12 NOTE — Progress Notes (Signed)
She presents today concerned about a painful nodule to the dorsal aspect of the right foot states that is been present for many years but has recently become more more symptomatic not hurting every day but occasionally.  States that different shoe gear definitely makes a difference as to the amount of pain she endorses on a daily basis.  States that her diabetes is in check however she states that she has not checked it in several weeks.  She does relate some numbness to the fourth and fifth toes of the right foot.  Objective: Vital signs are stable she is alert oriented x3 pulses are palpable.  Neurologic sensorium is intact.  dT reflexes are intact.  Muscle strength is normal symmetrical bilateral.  Orthopedic evaluation demonstrates all joints distal to ankle full range of motion no crepitation.  A osseous nodule dorsal aspect of the second metatarsal intermediate cuneiform articulation moderately tender on palpation as the deep peroneal nerve passes over top of it.  Assessment: Dorsal capsulitis TMT osteoarthritis right.  Diabetic peripheral neuropathy  Plan: Discussed etiology pathology conservative surgical therapies offered her injection she declined placed padding showed her how to lace her shoes and discussed the possible need for surgical intervention.  She understands and is amenable to it expressed to her how important it was to keep her blood sugar very tight and close to normal and that checking that daily would help her do that.  I expressed to her that this would be more than likely the best way to help control progression of her neuropathy.

## 2018-09-24 ENCOUNTER — Ambulatory Visit: Payer: Managed Care, Other (non HMO) | Admitting: Family Medicine

## 2018-10-30 ENCOUNTER — Other Ambulatory Visit: Payer: Self-pay | Admitting: Family Medicine

## 2018-11-24 ENCOUNTER — Other Ambulatory Visit: Payer: Self-pay | Admitting: Family Medicine

## 2018-11-24 NOTE — Telephone Encounter (Signed)
Requested Prescriptions  Pending Prescriptions Disp Refills  . glipiZIDE (GLUCOTROL) 5 MG tablet [Pharmacy Med Name: glipiZIDE 5 MG Oral Tablet] 60 tablet 0    Sig: Take 1 tablet by mouth twice daily     Endocrinology:  Diabetes - Sulfonylureas Failed - 11/24/2018  4:52 PM      Failed - HBA1C is between 0 and 7.9 and within 180 days    HB A1C (BAYER DCA - WAIVED)  Date Value Ref Range Status  06/25/2018 9.8 (H) <7.0 % Final    Comment:                                          Diabetic Adult            <7.0                                       Healthy Adult        4.3 - 5.7                                                           (DCCT/NGSP) American Diabetes Association's Summary of Glycemic Recommendations for Adults with Diabetes: Hemoglobin A1c <7.0%. More stringent glycemic goals (A1c <6.0%) may further reduce complications at the cost of increased risk of hypoglycemia.          Passed - Valid encounter within last 6 months    Recent Outpatient Visits          4 months ago Palpitations   [place], [place], [place]   5 months ago Diabetes mellitus without complication [facility])   [facility] [place], [place]   8 months ago Encounter to establish care   [facility], [place], [place]

## 2018-11-27 ENCOUNTER — Ambulatory Visit: Payer: Managed Care, Other (non HMO)

## 2018-11-27 ENCOUNTER — Ambulatory Visit
Admission: RE | Admit: 2018-11-27 | Discharge: 2018-11-27 | Disposition: A | Discharge status: Home / Self Care | Payer: Managed Care, Other (non HMO) | Source: Ambulatory Visit | Attending: Podiatry | Admitting: Podiatry

## 2018-11-27 DIAGNOSIS — M7672 Peroneal tendinitis, left leg: Secondary | ICD-10-CM

## 2019-01-09 ENCOUNTER — Encounter: Payer: Self-pay | Admitting: Podiatry

## 2019-01-09 ENCOUNTER — Other Ambulatory Visit: Payer: Self-pay

## 2019-01-09 ENCOUNTER — Ambulatory Visit: Payer: Managed Care, Other (non HMO) | Admitting: Podiatry

## 2019-01-09 VITALS — Temp 98.5°F

## 2019-01-09 DIAGNOSIS — M19079 Primary osteoarthritis, unspecified ankle and foot: Secondary | ICD-10-CM | POA: Diagnosis not present

## 2019-01-09 NOTE — Progress Notes (Signed)
She presents today for follow-up of her MRI of her left foot.  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  Assessment: Osteoarthritis left foot.  Plan: We will see about getting her a pair of orthotics made.  Should this fail surgical fusion will be necessary.

## 2019-01-12 ENCOUNTER — Other Ambulatory Visit: Payer: Self-pay | Admitting: Family Medicine

## 2019-01-12 NOTE — Telephone Encounter (Signed)
Forwarding medication refill request to PCP for review. 

## 2019-01-14 ENCOUNTER — Encounter: Payer: Self-pay | Admitting: Family Medicine

## 2019-01-14 NOTE — Telephone Encounter (Signed)
LVM for pt to call back for appt also mailing letter.

## 2019-01-14 NOTE — Telephone Encounter (Signed)
Several months overdue for DM f/u, please get her scheduled and I will bridge her

## 2019-01-23 ENCOUNTER — Other Ambulatory Visit: Payer: Self-pay

## 2019-01-23 ENCOUNTER — Ambulatory Visit (INDEPENDENT_AMBULATORY_CARE_PROVIDER_SITE_OTHER): Payer: Managed Care, Other (non HMO) | Admitting: Orthotics

## 2019-01-23 DIAGNOSIS — M7671 Peroneal tendinitis, right leg: Secondary | ICD-10-CM | POA: Diagnosis not present

## 2019-01-23 DIAGNOSIS — M7672 Peroneal tendinitis, left leg: Secondary | ICD-10-CM

## 2019-01-23 DIAGNOSIS — E1142 Type 2 diabetes mellitus with diabetic polyneuropathy: Secondary | ICD-10-CM

## 2019-01-23 DIAGNOSIS — M19079 Primary osteoarthritis, unspecified ankle and foot: Secondary | ICD-10-CM

## 2019-01-23 NOTE — Progress Notes (Signed)
Patient presents today with a hx of peroneal tendonitis/OA.  Upon assessment, patient has pronounced pes planus wGoal is provide longitudinal arch support and RF stability.  Plan on deep heel cup, hug arch, wide foot orthosis w/ medial flange and slight valgus correction for lateral hind foot impingement.

## 2019-02-20 ENCOUNTER — Ambulatory Visit: Payer: Managed Care, Other (non HMO) | Admitting: Orthotics

## 2019-02-20 ENCOUNTER — Other Ambulatory Visit: Payer: Self-pay

## 2019-02-20 DIAGNOSIS — E1142 Type 2 diabetes mellitus with diabetic polyneuropathy: Secondary | ICD-10-CM

## 2019-02-20 DIAGNOSIS — M7672 Peroneal tendinitis, left leg: Secondary | ICD-10-CM

## 2019-02-21 NOTE — Progress Notes (Signed)
Patient came in today to pick up custom made foot orthotics.  The goals were accomplished and the patient reported no dissatisfaction with said orthotics.  Patient was advised of breakin period and how to report any issues. 

## 2019-05-08 ENCOUNTER — Other Ambulatory Visit: Payer: Managed Care, Other (non HMO) | Admitting: Orthotics

## 2019-05-11 ENCOUNTER — Other Ambulatory Visit: Payer: Self-pay | Admitting: Family Medicine

## 2019-06-05 ENCOUNTER — Other Ambulatory Visit: Payer: Self-pay

## 2019-06-05 ENCOUNTER — Ambulatory Visit: Payer: Managed Care, Other (non HMO) | Admitting: Orthotics

## 2019-06-05 DIAGNOSIS — E1142 Type 2 diabetes mellitus with diabetic polyneuropathy: Secondary | ICD-10-CM

## 2019-06-05 DIAGNOSIS — M7672 Peroneal tendinitis, left leg: Secondary | ICD-10-CM

## 2019-06-05 DIAGNOSIS — M19079 Primary osteoarthritis, unspecified ankle and foot: Secondary | ICD-10-CM

## 2019-06-05 NOTE — Progress Notes (Signed)
Making device wider and full length.

## 2019-08-12 ENCOUNTER — Telehealth (INDEPENDENT_AMBULATORY_CARE_PROVIDER_SITE_OTHER): Payer: Managed Care, Other (non HMO) | Admitting: Family Medicine

## 2019-08-12 ENCOUNTER — Encounter: Payer: Self-pay | Admitting: Family Medicine

## 2019-08-12 ENCOUNTER — Telehealth: Payer: Managed Care, Other (non HMO) | Admitting: Family Medicine

## 2019-08-12 DIAGNOSIS — E119 Type 2 diabetes mellitus without complications: Secondary | ICD-10-CM | POA: Diagnosis not present

## 2019-08-12 DIAGNOSIS — M2550 Pain in unspecified joint: Secondary | ICD-10-CM

## 2019-08-12 DIAGNOSIS — I10 Essential (primary) hypertension: Secondary | ICD-10-CM

## 2019-08-12 MED ORDER — LISINOPRIL 20 MG PO TABS: 20.0000 mg | ORAL_TABLET | Freq: Every day | ORAL | 0 refills | Status: DC

## 2019-08-12 MED ORDER — METFORMIN HCL 1000 MG PO TABS: 1000.0000 mg | ORAL_TABLET | Freq: Two times a day (BID) | ORAL | 0 refills | Status: DC

## 2019-08-12 MED ORDER — GLIPIZIDE 5 MG PO TABS: 5.0000 mg | ORAL_TABLET | Freq: Two times a day (BID) | ORAL | 0 refills | Status: DC

## 2019-08-12 NOTE — Assessment & Plan Note (Signed)
Restart medications, check labs in 1 month. Work on diet and exercise for increased control

## 2019-08-12 NOTE — Progress Notes (Signed)
There were no vitals taken for this visit.   Subjective:    Patient ID: Yesenia Stevens, female    DOB: 05/15/1966, 54 y.o.   MRN: [id number]  HPI: Yesenia Stevens is a 54 y.o. female  Chief Complaint  Patient presents with  . Hypertension  . Diabetes  . Pain    . This visit was completed via MyChart due to the restrictions of the COVID-19 pandemic. All issues as above were discussed and addressed. Physical exam was done as above through visual confirmation on MyChart. If it was felt that the patient should be evaluated in the office, they were directed there. The patient verbally consented to this visit. . Location of the patient: in parked car . Location of the provider: work . Those involved with this call:  . Provider: Roosvelt Maser, PA-C . CMA: Elton Sin, CMA . Front Desk/Registration: Harriet Pho  . Time spent on call: 25 minutes with patient face to face via video conference. More than 50% of this time was spent in counseling and coordination of care. 5 minutes total spent in review of patient's record and preparation of their chart. I verified patient identity using two factors (patient name and date of birth). Patient consents verbally to being seen via telemedicine visit today.   Patient presenting today for chronic condition f/u after being lost to f/u the past year.   Joint pains, neuropathy pains getting worse in multiple joints. Taking advil as needed with some relief. Has been on gabapentin in the past which did not help. Has diclofenac gel but has not used it lately. Denies joint swelling, new injuries.   HTN - Has been out of her medicines for about a month or so. Does not check home BPs.   DM - ran out of metformin today. Ran out of the glipizide a month or so. Has not been following strict diet, tries to stay active. Denies side effects, low blood sugar spells. Does not check home readings.   Relevant past medical, surgical, family and social history  reviewed and updated as indicated. Interim medical history since our last visit reviewed. Allergies and medications reviewed and updated.  Review of Systems  Per HPI unless specifically indicated above     Objective:    There were no vitals taken for this visit.  Wt Readings from Last 3 Encounters:  07/06/18 (!) 322 lb 3.2 oz (146.1 kg)  06/25/18 (!) 318 lb 2 oz (144.3 kg)  03/22/18 (!) 315 lb (142.9 kg)    Physical Exam Vitals and nursing note reviewed.  Constitutional:      General: She is not in acute distress.    Appearance: Normal appearance.  HENT:     Head: Atraumatic.     Right Ear: External ear normal.     Left Ear: External ear normal.     Nose: Nose normal. No congestion.     Mouth/Throat:     Mouth: Mucous membranes are moist.     Pharynx: Oropharynx is clear. No posterior oropharyngeal erythema.  Eyes:     Extraocular Movements: Extraocular movements intact.     Conjunctiva/sclera: Conjunctivae normal.  Cardiovascular:     Comments: Unable to assess via virtual visit Pulmonary:     Effort: Pulmonary effort is normal. No respiratory distress.  Musculoskeletal:        General: Normal range of motion.     Cervical back: Normal range of motion.  Skin:    General: Skin is dry.  Findings: No erythema.  Neurological:     Mental Status: She is alert and oriented to person, place, and time.  Psychiatric:        Mood and Affect: Mood normal.        Thought Content: Thought content normal.        Judgment: Judgment normal.     Results for orders placed or performed in visit on 07/06/18  Microscopic Examination   URINE  Result Value Ref Range   WBC, UA 0-5 0 - 5 /hpf   RBC, UA None seen 0 - 2 /hpf   Epithelial Cells (non renal) 0-10 0 - 10 /hpf   Mucus, UA Present Not Estab.   Bacteria, UA None seen None seen/Few  Cologuard  Result Value Ref Range   Cologuard Negative Negative  TSH  Result Value Ref Range   TSH 1.370 0.450 - 4.500 uIU/mL  CBC with  Differential/Platelet  Result Value Ref Range   WBC 7.4 3.4 - 10.8 x10E3/uL   RBC 4.65 3.77 - 5.28 x10E6/uL   Hemoglobin 11.4 11.1 - 15.9 g/dL   Hematocrit 75.1 70.0 - 46.6 %   MCV 76 (L) 79 - 97 fL   MCH 24.5 (L) 26.6 - 33.0 pg   MCHC 32.1 31.5 - 35.7 g/dL   RDW 17.4 94.4 - 96.7 %   Platelets 347 150 - 450 x10E3/uL   Neutrophils 53 Not Estab. %   Lymphs 38 Not Estab. %   Monocytes 7 Not Estab. %   Eos 2 Not Estab. %   Basos 0 Not Estab. %   Neutrophils Absolute 3.9 1.4 - 7.0 x10E3/uL   Lymphocytes Absolute 2.8 0.7 - 3.1 x10E3/uL   Monocytes Absolute 0.5 0.1 - 0.9 x10E3/uL   EOS (ABSOLUTE) 0.1 0.0 - 0.4 x10E3/uL   Basophils Absolute 0.0 0.0 - 0.2 x10E3/uL   Immature Granulocytes 0 Not Estab. %   Immature Grans (Abs) 0.0 0.0 - 0.1 x10E3/uL  Lipid Panel w/o Chol/HDL Ratio  Result Value Ref Range   Cholesterol, Total 189 100 - 199 mg/dL   Triglycerides 75 0 - 149 mg/dL   HDL 57 >59 mg/dL   VLDL Cholesterol Cal 15 5 - 40 mg/dL   LDL Calculated 163 (H) 0 - 99 mg/dL  UA/M w/rflx Culture, Routine   Specimen: Urine   URINE  Result Value Ref Range   Specific Gravity, UA >1.030 (H) 1.005 - 1.030   pH, UA 5.5 5.0 - 7.5   Color, UA Yellow Yellow   Appearance Ur Clear Clear   Leukocytes, UA Negative Negative   Protein, UA Trace (A) Negative/Trace   Glucose, UA Trace (A) Negative   Ketones, UA Negative Negative   RBC, UA Trace (A) Negative   Bilirubin, UA Negative Negative   Urobilinogen, Ur 2.0 (H) 0.2 - 1.0 mg/dL   Nitrite, UA Negative Negative   Microscopic Examination See below:   Cytology - PAP  Result Value Ref Range   Adequacy      Satisfactory for evaluation  endocervical/transformation zone component ABSENT.   Diagnosis      NEGATIVE FOR INTRAEPITHELIAL LESIONS OR MALIGNANCY.   HPV NOT DETECTED    Material Submitted CervicoVaginal Pap [ThinPrep Imaged]       Assessment & Plan:   Problem List Items Addressed This Visit      Cardiovascular and Mediastinum    Hypertension - Primary    Unable to provide BP reading for today's visit and has been out of medications  for over a month. Restart medications, f/u in 1 month for recheck and labs. Will work on getting home BP monitor so she can follow at home      Relevant Medications   lisinopril (ZESTRIL) 20 MG tablet     Endocrine   Diabetes mellitus without complication ([place])    Restart medications, check labs in 1 month. Work on diet and exercise for increased control      Relevant Medications   metFORMIN (GLUCOPHAGE) 1000 MG tablet   lisinopril (ZESTRIL) 20 MG tablet   glipiZIDE (GLUCOTROL) 5 MG tablet    Other Visit Diagnoses    Arthralgia, unspecified joint       Weight loss, increased exercise, diclofenac gel and tylenol prn. F/u if not improving       Follow up plan: Return in about 4 weeks (around 09/09/2019) for 6 month f/u.

## 2019-08-13 NOTE — Assessment & Plan Note (Signed)
Unable to provide BP reading for today's visit and has been out of medications for over a month. Restart medications, f/u in 1 month for recheck and labs. Will work on getting home BP monitor so she can follow at home

## 2019-08-13 NOTE — Progress Notes (Signed)
LVM  to make 1 month f.u

## 2019-08-14 ENCOUNTER — Other Ambulatory Visit: Payer: Self-pay | Admitting: Family Medicine

## 2019-08-19 ENCOUNTER — Telehealth: Payer: Self-pay | Admitting: Family Medicine

## 2019-08-19 MED ORDER — HYDROCHLOROTHIAZIDE 25 MG PO TABS: 25.0000 mg | ORAL_TABLET | Freq: Every day | ORAL | 0 refills | Status: DC

## 2019-08-19 NOTE — Telephone Encounter (Signed)
Pt had an appt on 08-12-2019 and needs refill on hctz. [facility] [place]

## 2019-08-19 NOTE — Telephone Encounter (Signed)
Pt had an appt on 08-12-2019 and needs refill on hctz. [facility] [place]  Requested medication (s) are due for refill today: HCTZ , yes  Requested medication (s) are on the active medication list: no  Last refill: 08/12/19  Future visit scheduled:yes  Notes to clinic:  Not on active med list

## 2019-08-19 NOTE — Telephone Encounter (Signed)
Routing to provider. HCTZ 25 mg in historical med list. Looks like patient was on it until most recent visit, did not read any documentation about it being D/C.

## 2019-08-19 NOTE — Addendum Note (Signed)
Addended by: Roosvelt Maser E on: 08/19/2019 11:39 AM   Modules accepted: Orders

## 2019-08-19 NOTE — Telephone Encounter (Signed)
Rx sent 

## 2019-08-21 ENCOUNTER — Encounter: Payer: Self-pay | Admitting: Family Medicine

## 2019-08-21 ENCOUNTER — Other Ambulatory Visit: Payer: Self-pay

## 2019-08-21 ENCOUNTER — Ambulatory Visit: Payer: Managed Care, Other (non HMO) | Admitting: Family Medicine

## 2019-08-21 VITALS — BP 170/101 | HR 105 | Temp 99.4°F | Wt 329.0 lb

## 2019-08-21 DIAGNOSIS — N390 Urinary tract infection, site not specified: Secondary | ICD-10-CM

## 2019-08-21 DIAGNOSIS — E119 Type 2 diabetes mellitus without complications: Secondary | ICD-10-CM | POA: Diagnosis not present

## 2019-08-21 DIAGNOSIS — I10 Essential (primary) hypertension: Secondary | ICD-10-CM

## 2019-08-21 MED ORDER — SULFAMETHOXAZOLE-TRIMETHOPRIM 800-160 MG PO TABS: 1.0000 | ORAL_TABLET | Freq: Two times a day (BID) | ORAL | 0 refills | Status: DC

## 2019-08-21 NOTE — Progress Notes (Signed)
BP (!) 170/101   Pulse (!) 105   Temp 99.4 F (37.4 C) (Oral)   Wt (!) 329 lb (149.2 kg)   SpO2 98%   BMI 55.60 kg/m    Subjective:    Patient ID: Yesenia Stevens, female    DOB: 11-16-1965, 54 y.o.   MRN: [id number]  HPI: Yesenia Stevens is a 54 y.o. female  Chief Complaint  Patient presents with  . Urinary Frequency    symptoms started a week ago  . Dysuria  . Abdominal Pain    lower abdomen   About a week of urinary frequency, dysuria, and lower abdominal cramping and pain. Has tried some advil with some relief. Denies bowel changes, fevers, chills, sweats, hematuria. Not trying anything OTC for sxs.   Just started on HCTZ a day or two ago. Has not been checking home BP readings due to no monitor. Tolerating being back on medications well. Has f/u in 2 weeks to monitor progress after getting back on all medications. Denies CP, SOB, HAs, dizziness.   Relevant past medical, surgical, family and social history reviewed and updated as indicated. Interim medical history since our last visit reviewed. Allergies and medications reviewed and updated.  Review of Systems  Per HPI unless specifically indicated above     Objective:    BP (!) 170/101   Pulse (!) 105   Temp 99.4 F (37.4 C) (Oral)   Wt (!) 329 lb (149.2 kg)   SpO2 98%   BMI 55.60 kg/m   Wt Readings from Last 3 Encounters:  08/21/19 (!) 329 lb (149.2 kg)  07/06/18 (!) 322 lb 3.2 oz (146.1 kg)  06/25/18 (!) 318 lb 2 oz (144.3 kg)    Physical Exam Vitals and nursing note reviewed.  Constitutional:      Appearance: Normal appearance. She is not ill-appearing.  HENT:     Head: Atraumatic.  Eyes:     Extraocular Movements: Extraocular movements intact.     Conjunctiva/sclera: Conjunctivae normal.  Cardiovascular:     Rate and Rhythm: Normal rate and regular rhythm.     Heart sounds: Normal heart sounds.  Pulmonary:     Effort: Pulmonary effort is normal.     Breath sounds: Normal breath sounds.    Abdominal:     General: Bowel sounds are normal. There is no distension.     Palpations: Abdomen is soft.     Tenderness: There is no abdominal tenderness. There is no right CVA tenderness, left CVA tenderness or guarding.  Musculoskeletal:        General: Normal range of motion.     Cervical back: Normal range of motion and neck supple.  Skin:    General: Skin is warm and dry.  Neurological:     Mental Status: She is alert and oriented to person, place, and time.  Psychiatric:        Mood and Affect: Mood normal.        Thought Content: Thought content normal.        Judgment: Judgment normal.     Results for orders placed or performed in visit on 08/21/19  Microscopic Examination   URINE  Result Value Ref Range   WBC, UA 11-30 (A) 0 - 5 /hpf   RBC 3-10 (A) 0 - 2 /hpf   Epithelial Cells (non renal) 0-10 0 - 10 /hpf   Casts Present None seen /lpf   Cast Type Granular casts (A) N/A   Crystals Present N/A  Crystal Type Calcium Oxalate N/A   Mucus, UA Present Not Estab.   Bacteria, UA Many (A) None seen/Few  Urine Culture, Reflex   URINE  Result Value Ref Range   Urine Culture, Routine Preliminary report (A)    Organism ID, Bacteria Gram negative rods (A)   UA/M w/rflx Culture, Routine   Specimen: Urine   URINE  Result Value Ref Range   Specific Gravity, UA 1.020 1.005 - 1.030   pH, UA 5.5 5.0 - 7.5   Color, UA Yellow Yellow   Appearance Ur Hazy (A) Clear   Leukocytes,UA 1+ (A) Negative   Protein,UA 1+ (A) Negative/Trace   Glucose, UA 2+ (A) Negative   Ketones, UA Trace (A) Negative   RBC, UA 2+ (A) Negative   Bilirubin, UA Negative Negative   Urobilinogen, Ur 1.0 0.2 - 1.0 mg/dL   Nitrite, UA Positive (A) Negative   Microscopic Examination See below:    Urinalysis Reflex Comment       Assessment & Plan:   Problem List Items Addressed This Visit      Cardiovascular and Mediastinum   Hypertension    Significantly elevated today, but newly back on  medications and not feeling well. Will write script for home BP monitor, start logging home readings, and f/u as scheduled for recheck in a couple weeks        Endocrine   Diabetes mellitus without complication ([place])    So far tolerating being back on medications well, continue working on diet and exercise changes       Other Visit Diagnoses    Acute lower UTI    -  Primary   Tx with bactrim, await culture, push fluids. F/u if worsening or not improving   Relevant Medications   sulfamethoxazole-trimethoprim (BACTRIM DS) 800-160 MG tablet   Other Relevant Orders   UA/M w/rflx Culture, Routine (Completed)       Follow up plan: Return for as scheduled.

## 2019-08-26 LAB — MICROSCOPIC EXAMINATION

## 2019-08-26 LAB — UA/M W/RFLX CULTURE, ROUTINE
Bilirubin, UA: NEGATIVE
Nitrite, UA: POSITIVE — AB
Specific Gravity, UA: 1.02 (ref 1.005–1.030)
Urobilinogen, Ur: 1 mg/dL (ref 0.2–1.0)
pH, UA: 5.5 (ref 5.0–7.5)

## 2019-08-26 LAB — URINE CULTURE, REFLEX

## 2019-08-26 NOTE — Assessment & Plan Note (Signed)
So far tolerating being back on medications well, continue working on diet and exercise changes

## 2019-08-26 NOTE — Assessment & Plan Note (Signed)
Significantly elevated today, but newly back on medications and not feeling well. Will write script for home BP monitor, start logging home readings, and f/u as scheduled for recheck in a couple weeks

## 2019-08-30 ENCOUNTER — Other Ambulatory Visit: Payer: Self-pay | Admitting: Family Medicine

## 2019-08-30 MED ORDER — GLIPIZIDE 5 MG PO TABS: 5.0000 mg | ORAL_TABLET | Freq: Two times a day (BID) | ORAL | 0 refills | Status: DC

## 2019-08-30 NOTE — Telephone Encounter (Signed)
Requested medication (s) are due for refill today yes  Requested medication (s) are on the active medication list -yes  Future visit scheduled -yes  Last refill: 2 weeks  Notes to clinic: Patient has scheduled appointment- last refill states patient needs appointment before next RF- sent for review  Requested Prescriptions  Pending Prescriptions Disp Refills   glipiZIDE (GLUCOTROL) 5 MG tablet 30 tablet 0    Sig: Take 1 tablet (5 mg total) by mouth 2 (two) times daily.      Endocrinology:  Diabetes - Sulfonylureas Failed - 08/30/2019 11:52 AM      Failed - HBA1C is between 0 and 7.9 and within 180 days    HB A1C (BAYER DCA - WAIVED)  Date Value Ref Range Status  06/25/2018 9.8 (H) <7.0 % Final    Comment:                                          Diabetic Adult            <7.0                                       Healthy Adult        4.3 - 5.7                                                           (DCCT/NGSP) American Diabetes Association's Summary of Glycemic Recommendations for Adults with Diabetes: Hemoglobin A1c <7.0%. More stringent glycemic goals (A1c <6.0%) may further reduce complications at the cost of increased risk of hypoglycemia.           Passed - Valid encounter within last 6 months    Recent Outpatient Visits           1 week ago Acute lower UTI   [facility] Volney American, [place]   2 weeks ago Essential hypertension   [facility] Merrie Roof [place], [place]   1 year ago Allen, Rachel [place], [place]   1 year ago Diabetes mellitus without complication [facility])   [facility] Volney American, [place]   1 year ago Encounter to establish care   [facility], Lilia Argue, [place]       Future Appointments             In 1 week Volney American, [place], [facility]                Requested Prescriptions  Pending  Prescriptions Disp Refills   glipiZIDE (GLUCOTROL) 5 MG tablet 30 tablet 0    Sig: Take 1 tablet (5 mg total) by mouth 2 (two) times daily.      Endocrinology:  Diabetes - Sulfonylureas Failed - 08/30/2019 11:52 AM      Failed - HBA1C is between 0 and 7.9 and within 180 days    HB A1C (BAYER DCA - WAIVED)  Date Value Ref Range Status  06/25/2018 9.8 (H) <7.0 % Final    Comment:  Diabetic Adult            <7.0                                       Healthy Adult        4.3 - 5.7                                                           (DCCT/NGSP) American Diabetes Association's Summary of Glycemic Recommendations for Adults with Diabetes: Hemoglobin A1c <7.0%. More stringent glycemic goals (A1c <6.0%) may further reduce complications at the cost of increased risk of hypoglycemia.           Passed - Valid encounter within last 6 months    Recent Outpatient Visits           1 week ago Acute lower UTI   [facility] Particia Nearing, [place]   2 weeks ago Essential hypertension   [facility] Roosvelt Maser [place], [place]   1 year ago Palpitations   [facility] Roosvelt Maser [place], [place]   1 year ago Diabetes mellitus without complication [facility])   [facility] Particia Nearing, [place]   1 year ago Encounter to establish care   [facility], Salley Hews, [place]       Future Appointments             In 1 week Maurice March, Salley Hews, PA-C [facility], [facility]

## 2019-08-30 NOTE — Telephone Encounter (Signed)
glipiZIDE (GLUCOTROL) 5 MG tablet     Patient is requesting refill.    Pharmacy:  [facility] [address] [place] - [address] Phone:  [phone]  Fax:  [phone]

## 2019-08-30 NOTE — Telephone Encounter (Signed)
Routing to provider  

## 2019-09-06 ENCOUNTER — Ambulatory Visit: Payer: Managed Care, Other (non HMO)

## 2019-09-07 ENCOUNTER — Ambulatory Visit: Payer: Managed Care, Other (non HMO) | Attending: Internal Medicine

## 2019-09-07 DIAGNOSIS — Z23 Encounter for immunization: Secondary | ICD-10-CM

## 2019-09-07 NOTE — Progress Notes (Signed)
   Covid-19 Vaccination Clinic  Name:  Valencia Kassa    MRN: [id number] DOB: 04/19/1966  09/07/2019  Ms. Keeler was observed post Covid-19 immunization for 15 minutes without incident. She was provided with Vaccine Information Sheet and instruction to access the V-Safe system.   Ms. Schall was instructed to call 911 with any severe reactions post vaccine: Marland Kitchen Difficulty breathing  . Swelling of face and throat  . A fast heartbeat  . A bad rash all over body  . Dizziness and weakness   Immunizations Administered    Name Date Dose VIS Date Route   Pfizer COVID-19 Vaccine 09/07/2019  3:46 PM 0.3 mL 05/31/2019 Intramuscular   Manufacturer: [employer], [employer]   Lot: [id number]   NDC: [address]-1

## 2019-09-11 ENCOUNTER — Ambulatory Visit (INDEPENDENT_AMBULATORY_CARE_PROVIDER_SITE_OTHER): Payer: Managed Care, Other (non HMO) | Admitting: Family Medicine

## 2019-09-11 ENCOUNTER — Telehealth: Payer: Self-pay | Admitting: Family Medicine

## 2019-09-11 ENCOUNTER — Encounter: Payer: Self-pay | Admitting: Family Medicine

## 2019-09-11 ENCOUNTER — Other Ambulatory Visit: Payer: Self-pay

## 2019-09-11 VITALS — BP 163/87 | HR 92 | Temp 98.3°F | Ht 66.0 in | Wt 320.0 lb

## 2019-09-11 DIAGNOSIS — E119 Type 2 diabetes mellitus without complications: Secondary | ICD-10-CM

## 2019-09-11 DIAGNOSIS — E785 Hyperlipidemia, unspecified: Secondary | ICD-10-CM

## 2019-09-11 DIAGNOSIS — I10 Essential (primary) hypertension: Secondary | ICD-10-CM

## 2019-09-11 DIAGNOSIS — E1169 Type 2 diabetes mellitus with other specified complication: Secondary | ICD-10-CM | POA: Diagnosis not present

## 2019-09-11 MED ORDER — LISINOPRIL 40 MG PO TABS: 40.0000 mg | ORAL_TABLET | Freq: Every day | ORAL | 0 refills | Status: DC

## 2019-09-11 MED ORDER — HYDROCHLOROTHIAZIDE 25 MG PO TABS: 25.0000 mg | ORAL_TABLET | Freq: Every day | ORAL | 0 refills | Status: DC

## 2019-09-11 MED ORDER — LISINOPRIL 20 MG PO TABS: 20.0000 mg | ORAL_TABLET | Freq: Every day | ORAL | 0 refills | Status: DC

## 2019-09-11 MED ORDER — METFORMIN HCL 1000 MG PO TABS: 1000.0000 mg | ORAL_TABLET | Freq: Two times a day (BID) | ORAL | 0 refills | Status: DC

## 2019-09-11 MED ORDER — GLIPIZIDE 5 MG PO TABS: 5.0000 mg | ORAL_TABLET | Freq: Two times a day (BID) | ORAL | 0 refills | Status: DC

## 2019-09-11 NOTE — Telephone Encounter (Signed)
Pt.is in office asking if her RX can be refilled she forgot that to mention this during her visit.Please advise.

## 2019-09-11 NOTE — Progress Notes (Signed)
BP (!) 163/87   Pulse 92   Temp 98.3 F (36.8 C) (Oral)   Ht 5\' 6"  (1.676 m)   Wt (!) 320 lb (145.2 kg)   SpO2 98%   BMI 51.65 kg/m    Subjective:    Patient ID: , female    DOB: 10/30/1965, 54 y.o.   MRN: [id number]  HPI: Yesenia Stevens is a 54 y.o. female  Chief Complaint  Patient presents with  . Diabetes  . Hypertension   Presenting today for 1 month DM and HTN f/u after restarting most of her previous medications. Has only just started the HCTZ about 2 days ago. Taking medication faithfully and tolerating well. Not regularly checking home BPs. Denies CP, SOB, HAs, dizziness.   DM - tolerating medications well. Has not been watching diet or exercising. Not checking home BSs. No low blood sugar sxs noted.   Relevant past medical, surgical, family and social history reviewed and updated as indicated. Interim medical history since our last visit reviewed. Allergies and medications reviewed and updated.  Review of Systems  Per HPI unless specifically indicated above     Objective:    BP (!) 163/87   Pulse 92   Temp 98.3 F (36.8 C) (Oral)   Ht 5\' 6"  (1.676 m)   Wt (!) 320 lb (145.2 kg)   SpO2 98%   BMI 51.65 kg/m   Wt Readings from Last 3 Encounters:  09/11/19 (!) 320 lb (145.2 kg)  08/21/19 (!) 329 lb (149.2 kg)  07/06/18 (!) 322 lb 3.2 oz (146.1 kg)    Physical Exam Vitals and nursing note reviewed.  Constitutional:      Appearance: Normal appearance. She is not ill-appearing.  HENT:     Head: Atraumatic.  Eyes:     Extraocular Movements: Extraocular movements intact.     Conjunctiva/sclera: Conjunctivae normal.  Cardiovascular:     Rate and Rhythm: Normal rate and regular rhythm.     Heart sounds: Normal heart sounds.  Pulmonary:     Effort: Pulmonary effort is normal.     Breath sounds: Normal breath sounds.  Musculoskeletal:        General: Normal range of motion.     Cervical back: Normal range of motion and neck supple.    Skin:    General: Skin is warm and dry.  Neurological:     Mental Status: She is alert and oriented to person, place, and time.  Psychiatric:        Mood and Affect: Mood normal.        Thought Content: Thought content normal.        Judgment: Judgment normal.     Results for orders placed or performed in visit on 09/11/19  Comprehensive metabolic panel  Result Value Ref Range   Glucose 215 (H) 65 - 99 mg/dL   BUN 13 6 - 24 mg/dL   Creatinine, Ser 07/08/18 0.57 - 1.00 mg/dL   GFR calc non Af Amer 78 >59 mL/min/1.73   GFR calc Af Amer 90 >59 mL/min/1.73   BUN/Creatinine Ratio 15 9 - 23   Sodium 141 134 - 144 mmol/L   Potassium 4.3 3.5 - 5.2 mmol/L   Chloride 102 96 - 106 mmol/L   CO2 26 20 - 29 mmol/L   Calcium 9.7 8.7 - 10.2 mg/dL   Total Protein 7.1 6.0 - 8.5 g/dL   Albumin 3.7 (L) 3.8 - 4.9 g/dL   Globulin, Total 3.4 1.5 - 4.5  g/dL   Albumin/Globulin Ratio 1.1 (L) 1.2 - 2.2   Bilirubin Total 0.2 0.0 - 1.2 mg/dL   Alkaline Phosphatase 72 39 - 117 IU/L   AST 13 0 - 40 IU/L   ALT 12 0 - 32 IU/L  HgB A1c  Result Value Ref Range   Hgb A1c MFr Bld 11.4 (H) 4.8 - 5.6 %   Est. average glucose Bld gHb Est-mCnc 280 mg/dL  Lipid Panel w/o Chol/HDL Ratio  Result Value Ref Range   Cholesterol, Total 185 100 - 199 mg/dL   Triglycerides 105 0 - 149 mg/dL   HDL 52 >39 mg/dL   VLDL Cholesterol Cal 19 5 - 40 mg/dL   LDL Chol Calc (NIH) 114 (H) 0 - 99 mg/dL      Assessment & Plan:   Problem List Items Addressed This Visit      Cardiovascular and Mediastinum   Hypertension    BPs not at goal. Increase lisinopril to 40 mg and continue HCTZ. Continue to monitor closely. DASH diet and exercise reviewed      Relevant Medications   hydrochlorothiazide (HYDRODIURIL) 25 MG tablet   Other Relevant Orders   Comprehensive metabolic panel (Completed)     Endocrine   Diabetes mellitus without complication ([place]) - Primary    Recheck A1C, adjust as needed. Continue current regimen and  work on diet and exercise      Relevant Medications   glipiZIDE (GLUCOTROL) 5 MG tablet   metFORMIN (GLUCOPHAGE) 1000 MG tablet   Other Relevant Orders   HgB A1c (Completed)     Other   Morbid (severe) obesity due to excess calories ([place])    Discussed at length need for weight loss through good diet and exercise changes.       Relevant Medications   glipiZIDE (GLUCOTROL) 5 MG tablet   metFORMIN (GLUCOPHAGE) 1000 MG tablet    Other Visit Diagnoses    Hyperlipidemia associated with type 2 diabetes mellitus ([facility])       Relevant Medications   glipiZIDE (GLUCOTROL) 5 MG tablet   metFORMIN (GLUCOPHAGE) 1000 MG tablet   Other Relevant Orders   Lipid Panel w/o Chol/HDL Ratio (Completed)       Follow up plan: Return in about 4 weeks (around 10/09/2019) for BP, DM f/u.

## 2019-09-11 NOTE — Telephone Encounter (Signed)
Done

## 2019-09-12 LAB — COMPREHENSIVE METABOLIC PANEL
ALT: 12 IU/L (ref 0–32)
AST: 13 IU/L (ref 0–40)
Albumin/Globulin Ratio: 1.1 — ABNORMAL LOW (ref 1.2–2.2)
Albumin: 3.7 g/dL — ABNORMAL LOW (ref 3.8–4.9)
Alkaline Phosphatase: 72 IU/L (ref 39–117)
BUN/Creatinine Ratio: 15 (ref 9–23)
BUN: 13 mg/dL (ref 6–24)
Bilirubin Total: 0.2 mg/dL (ref 0.0–1.2)
CO2: 26 mmol/L (ref 20–29)
Calcium: 9.7 mg/dL (ref 8.7–10.2)
Chloride: 102 mmol/L (ref 96–106)
Creatinine, Ser: 0.86 mg/dL (ref 0.57–1.00)
GFR calc Af Amer: 90 mL/min/{1.73_m2} (ref >59)
GFR calc non Af Amer: 78 mL/min/{1.73_m2} (ref >59)
Globulin, Total: 3.4 g/dL (ref 1.5–4.5)
Glucose: 215 mg/dL — ABNORMAL HIGH (ref 65–99)
Potassium: 4.3 mmol/L (ref 3.5–5.2)
Sodium: 141 mmol/L (ref 134–144)
Total Protein: 7.1 g/dL (ref 6.0–8.5)

## 2019-09-12 LAB — LIPID PANEL W/O CHOL/HDL RATIO
Cholesterol, Total: 185 mg/dL (ref 100–199)
HDL: 52 mg/dL (ref >39)
LDL Chol Calc (NIH): 114 mg/dL — ABNORMAL HIGH (ref 0–99)
Triglycerides: 105 mg/dL (ref 0–149)
VLDL Cholesterol Cal: 19 mg/dL (ref 5–40)

## 2019-09-12 LAB — HEMOGLOBIN A1C
Est. average glucose Bld gHb Est-mCnc: 280 mg/dL
Hgb A1c MFr Bld: 11.4 % — ABNORMAL HIGH (ref 4.8–5.6)

## 2019-09-22 NOTE — Assessment & Plan Note (Signed)
Discussed at length need for weight loss through good diet and exercise changes.

## 2019-09-22 NOTE — Assessment & Plan Note (Signed)
Recheck A1C, adjust as needed. Continue current regimen and work on diet and exercise 

## 2019-09-22 NOTE — Assessment & Plan Note (Signed)
BPs not at goal. Increase lisinopril to 40 mg and continue HCTZ. Continue to monitor closely. DASH diet and exercise reviewed

## 2019-09-30 ENCOUNTER — Ambulatory Visit: Payer: Managed Care, Other (non HMO) | Attending: Internal Medicine

## 2019-09-30 DIAGNOSIS — Z23 Encounter for immunization: Secondary | ICD-10-CM

## 2019-09-30 NOTE — Progress Notes (Signed)
   Covid-19 Vaccination Clinic  Name:  Janei Scheff    MRN: [id number] DOB: [birth year]  09/30/2019  Ms. Zwilling was observed post Covid-19 immunization for 15 minutes without incident. She was provided with Vaccine Information Sheet and instruction to access the V-Safe system.   Ms. [place] was instructed to call 911 with any severe reactions post vaccine: Marland Kitchen Difficulty breathing  . Swelling of face and throat  . A fast heartbeat  . A bad rash all over body  . Dizziness and weakness   Immunizations Administered    Name Date Dose VIS Date Route   Pfizer COVID-19 Vaccine 09/30/2019  2:10 PM 0.3 mL 05/31/2019 Intramuscular   Manufacturer: [employer], [employer]   Lot: [id number]   NDC: [address]-3

## 2019-10-02 ENCOUNTER — Ambulatory Visit: Payer: Managed Care, Other (non HMO)

## 2019-10-03 ENCOUNTER — Telehealth: Payer: Self-pay | Admitting: Family Medicine

## 2019-10-03 ENCOUNTER — Other Ambulatory Visit: Payer: Self-pay | Admitting: Family Medicine

## 2019-10-03 NOTE — Telephone Encounter (Signed)
Copied from CRM [id number]. Topic: General - Other >> Oct 03, 2019  4:17 PM Dalphine Handing A wrote: Patient would like a callback from [facility] nurse in regards to her medication refill request for antibiotics. Please advise

## 2019-10-03 NOTE — Telephone Encounter (Signed)
Called and spoke with patient. She stated that she has had a bladder infection and would like some antibiotics. Patient stated that she was on antibiotics few weeks ago but don't remember the name and wants a refill. I advised patient she might need an OV but patient stated that she just want the antibiotic refill. Please advice

## 2019-10-04 NOTE — Telephone Encounter (Signed)
Her appt for this UTI was 3/3, and the antibiotics should have covered the infection based on the culture at that time. We will need to re-evaluate to see if something new is going on unfortunately

## 2019-10-04 NOTE — Telephone Encounter (Signed)
Called pt and relayed Rachel's message pt is scheduled for 4/21 and states that she will try to make it until then

## 2019-10-04 NOTE — Telephone Encounter (Signed)
I have plenty of availability earlier if she wants to be seen sooner, and could even work her in this afternoon if needed.

## 2019-10-07 ENCOUNTER — Encounter: Payer: Self-pay | Admitting: Family Medicine

## 2019-10-07 ENCOUNTER — Ambulatory Visit: Payer: Managed Care, Other (non HMO) | Admitting: Family Medicine

## 2019-10-07 ENCOUNTER — Other Ambulatory Visit: Payer: Self-pay

## 2019-10-07 VITALS — BP 138/93 | HR 93 | Temp 98.4°F | Wt 323.0 lb

## 2019-10-07 DIAGNOSIS — R197 Diarrhea, unspecified: Secondary | ICD-10-CM

## 2019-10-07 DIAGNOSIS — E119 Type 2 diabetes mellitus without complications: Secondary | ICD-10-CM | POA: Diagnosis not present

## 2019-10-07 DIAGNOSIS — I10 Essential (primary) hypertension: Secondary | ICD-10-CM | POA: Diagnosis not present

## 2019-10-07 DIAGNOSIS — R300 Dysuria: Secondary | ICD-10-CM | POA: Diagnosis not present

## 2019-10-07 LAB — UA/M W/RFLX CULTURE, ROUTINE
Bilirubin, UA: NEGATIVE
Ketones, UA: NEGATIVE
Leukocytes,UA: NEGATIVE
Nitrite, UA: NEGATIVE
Specific Gravity, UA: 1.025 (ref 1.005–1.030)
Urobilinogen, Ur: 1 mg/dL (ref 0.2–1.0)
pH, UA: 5 (ref 5.0–7.5)

## 2019-10-07 LAB — MICROSCOPIC EXAMINATION: Bacteria, UA: NONE SEEN

## 2019-10-07 MED ORDER — SULFAMETHOXAZOLE-TRIMETHOPRIM 800-160 MG PO TABS: 1.0000 | ORAL_TABLET | Freq: Two times a day (BID) | ORAL | 0 refills | Status: DC

## 2019-10-07 MED ORDER — LISINOPRIL 40 MG PO TABS: 40.0000 mg | ORAL_TABLET | Freq: Every day | ORAL | 1 refills | Status: DC

## 2019-10-07 MED ORDER — HYDROCHLOROTHIAZIDE 25 MG PO TABS: 25.0000 mg | ORAL_TABLET | Freq: Every day | ORAL | 1 refills | Status: DC

## 2019-10-07 MED ORDER — GLIPIZIDE 5 MG PO TABS: 5.0000 mg | ORAL_TABLET | Freq: Two times a day (BID) | ORAL | 1 refills | Status: DC

## 2019-10-07 MED ORDER — METFORMIN HCL 1000 MG PO TABS: 1000.0000 mg | ORAL_TABLET | Freq: Two times a day (BID) | ORAL | 1 refills | Status: DC

## 2019-10-07 NOTE — Progress Notes (Signed)
BP (!) 138/93   Pulse 93   Temp 98.4 F (36.9 C) (Oral)   Wt (!) 323 lb (146.5 kg)   SpO2 99%   BMI 52.13 kg/m    Subjective:    Patient ID: Yesenia Stevens, female    DOB: [birth year], 54 y.o.   MRN: [id number]  HPI: [name] is a 54 y.o. female  Chief Complaint  Patient presents with  . Hypertension  . Diabetes  . Dysuria  . Urinary Frequency   Here today for DM and HTN f/u. Taking medications faithfully without side effects. Not checking home BSs or BPs at this time. Not following strict diet or exercise plan but trying to do better overall.   Dysuria, urinary frequency x 2 days. Denies fevers, chills, abdominal pain, N/V.   Over a year of loose stool daily. This did not improve off the metformin and other medications for several months. Trying diet changes, imodium prn with minimal relief. No known hx of bowel disease. Has never had a colonoscopies as she declines screening tests.   Relevant past medical, surgical, family and social history reviewed and updated as indicated. Interim medical history since our last visit reviewed. Allergies and medications reviewed and updated.  Review of Systems  Per HPI unless specifically indicated above     Objective:    BP (!) 138/93   Pulse 93   Temp 98.4 F (36.9 C) (Oral)   Wt (!) 323 lb (146.5 kg)   SpO2 99%   BMI 52.13 kg/m   Wt Readings from Last 3 Encounters:  10/07/19 (!) 323 lb (146.5 kg)  09/11/19 (!) 320 lb (145.2 kg)  08/21/19 (!) 329 lb (149.2 kg)    Physical Exam  Results for orders placed or performed in visit on 10/07/19  Microscopic Examination   URINE  Result Value Ref Range   WBC, UA 0-5 0 - 5 /hpf   RBC 3-10 (A) 0 - 2 /hpf   Epithelial Cells (non renal) 0-10 0 - 10 /hpf   Casts Present None seen /lpf   Cast Type Hyaline casts N/A   Bacteria, UA None seen None seen/Few  UA/M w/rflx Culture, Routine   Specimen: Urine   URINE  Result Value Ref Range   Specific Gravity, UA 1.025  1.005 - 1.030   pH, UA 5.0 5.0 - 7.5   Color, UA Yellow Yellow   Appearance Ur Clear Clear   Leukocytes,UA Negative Negative   Protein,UA 1+ (A) Negative/Trace   Glucose, UA Trace (A) Negative   Ketones, UA Negative Negative   RBC, UA Trace (A) Negative   Bilirubin, UA Negative Negative   Urobilinogen, Ur 1.0 0.2 - 1.0 mg/dL   Nitrite, UA Negative Negative   Microscopic Examination See below:       Assessment & Plan:   Problem List Items Addressed This Visit      Cardiovascular and Mediastinum   Hypertension - Primary    Overall BPs under better control, but still high normal/just above goal. Work on diet and exercise for further control and will recheck next visit      Relevant Medications   lisinopril (ZESTRIL) 40 MG tablet   hydrochlorothiazide (HYDRODIURIL) 25 MG tablet     Endocrine   Diabetes mellitus without complication ([place])    Continue working on [employer] and work on daily monitoring of BSs. Will recheck labs at upcoming visit and adjust medicines based on those results      Relevant Medications  lisinopril (ZESTRIL) 40 MG tablet   metFORMIN (GLUCOPHAGE) 1000 MG tablet   glipiZIDE (GLUCOTROL) 5 MG tablet    Other Visit Diagnoses    Diarrhea, unspecified type       Requesting referral to GI as nothing she's tried conservatively has worked. Referral generated   Relevant Orders   Ambulatory referral to Gastroenterology   Dysuria       U/A showing UTI, tx with bactrim, probiotics, and work on BS control for prevention of future infections. F/u if not improving. Await culture results   Relevant Orders   UA/M w/rflx Culture, Routine (Completed)       Follow up plan: Return in about 2 months (around 12/07/2019) for chronic f/u.

## 2019-10-08 ENCOUNTER — Telehealth: Payer: Self-pay

## 2019-10-08 NOTE — Telephone Encounter (Signed)
-----  Message from Volney American, [place] sent at 10/08/2019  7:28 AM EDT ----- Regarding: Glucometer And I forgot to ask for glucometer kit for her :( Yesterday afternoon was so hectic I'm sorry!

## 2019-10-08 NOTE — Telephone Encounter (Signed)
Diabetic testing supplies form fill out and placed in provider's folder for signature.

## 2019-10-09 ENCOUNTER — Ambulatory Visit: Payer: Managed Care, Other (non HMO) | Admitting: Family Medicine

## 2019-10-13 NOTE — Assessment & Plan Note (Signed)
Overall BPs under better control, but still high normal/just above goal. Work on diet and exercise for further control and will recheck next visit

## 2019-10-13 NOTE — Assessment & Plan Note (Signed)
Continue working on [employer] and work on daily monitoring of BSs. Will recheck labs at upcoming visit and adjust medicines based on those results

## 2019-10-14 NOTE — Telephone Encounter (Signed)
Rx faxed over

## 2019-10-21 ENCOUNTER — Other Ambulatory Visit: Payer: Self-pay | Admitting: Family Medicine

## 2019-10-23 ENCOUNTER — Other Ambulatory Visit: Payer: Self-pay | Admitting: Family Medicine

## 2019-10-23 NOTE — Telephone Encounter (Signed)
Requested Prescriptions  Pending Prescriptions Disp Refills  . hydrochlorothiazide (HYDRODIURIL) 25 MG tablet [Pharmacy Med Name: hydroCHLOROthiazide 25 MG Oral Tablet] 90 tablet 1    Sig: Take 1 tablet by mouth once daily     Cardiovascular: Diuretics - Thiazide Failed - 10/23/2019  5:30 AM      Failed - Last BP in normal range    BP Readings from Last 1 Encounters:  10/07/19 (!) 138/93         Passed - Ca in normal range and within 360 days    Calcium  Date Value Ref Range Status  09/11/2019 9.7 8.7 - 10.2 mg/dL Final         Passed - Cr in normal range and within 360 days    Creatinine, Ser  Date Value Ref Range Status  09/11/2019 0.86 0.57 - 1.00 mg/dL Final         Passed - K in normal range and within 360 days    Potassium  Date Value Ref Range Status  09/11/2019 4.3 3.5 - 5.2 mmol/L Final         Passed - Na in normal range and within 360 days    Sodium  Date Value Ref Range Status  09/11/2019 141 134 - 144 mmol/L Final         Passed - Valid encounter within last 6 months    Recent Outpatient Visits          2 weeks ago Essential hypertension   [facility] Particia Nearing, PA-C   1 month ago [facility]   [facility] Particia Nearing, [place]   2 months ago Acute lower UTI   [facility] [place], [place], [place]   2 months ago Essential hypertension   [facility] Roosvelt Maser [place], [place]   1 year ago Palpitations   [facility] [place], Salley Hews, [place]      Future Appointments            In 1 month Maurice March, [place] Hews, PA-C [facility]

## 2019-11-04 ENCOUNTER — Other Ambulatory Visit: Payer: Self-pay | Admitting: Family Medicine

## 2019-11-04 NOTE — Telephone Encounter (Signed)
Patient called in stating she is in need of this medication to be filled asap, as she is completely out. Please advise.

## 2019-11-04 NOTE — Telephone Encounter (Signed)
Requested Prescriptions  Pending Prescriptions Disp Refills  . glipiZIDE (GLUCOTROL) 5 MG tablet [Pharmacy Med Name: glipiZIDE 5 MG Oral Tablet] 60 tablet 0    Sig: Take 1 tablet by mouth twice daily     Endocrinology:  Diabetes - Sulfonylureas Failed - 11/04/2019  9:56 AM      Failed - HBA1C is between 0 and 7.9 and within 180 days    HB A1C (BAYER DCA - WAIVED)  Date Value Ref Range Status  06/25/2018 9.8 (H) <7.0 % Final    Comment:                                          Diabetic Adult            <7.0                                       Healthy Adult        4.3 - 5.7                                                           (DCCT/NGSP) American Diabetes Association's Summary of Glycemic Recommendations for Adults with Diabetes: Hemoglobin A1c <7.0%. More stringent glycemic goals (A1c <6.0%) may further reduce complications at the cost of increased risk of hypoglycemia.    Hgb A1c MFr Bld  Date Value Ref Range Status  09/11/2019 11.4 (H) 4.8 - 5.6 % Final    Comment:             Prediabetes: 5.7 - 6.4          Diabetes: >6.4          Glycemic control for adults with diabetes: <7.0          Passed - Valid encounter within last 6 months    Recent Outpatient Visits          4 weeks ago Essential hypertension   [facility] Roosvelt Maser [place], [place]   1 month ago Diabetes mellitus without complication [facility])   [facility] Particia Nearing, [place]   2 months ago Acute lower UTI   [facility], [place], [place]   2 months ago Essential hypertension   [facility] Roosvelt Maser [place], [place]   1 year ago Palpitations   [facility] [place], Salley Hews, [place]      Future Appointments            In 1 month Maurice March, [place], PA-C [facility], [facility]

## 2019-11-21 ENCOUNTER — Other Ambulatory Visit: Payer: Self-pay | Admitting: Family Medicine

## 2019-11-28 ENCOUNTER — Other Ambulatory Visit: Payer: Self-pay

## 2019-11-28 ENCOUNTER — Ambulatory Visit: Payer: Managed Care, Other (non HMO) | Admitting: Gastroenterology

## 2019-11-28 VITALS — Ht 66.0 in | Wt 324.6 lb

## 2019-11-28 DIAGNOSIS — R197 Diarrhea, unspecified: Secondary | ICD-10-CM

## 2019-11-28 MED ORDER — CHOLESTYRAMINE LIGHT 4 G PO PACK
4.0000 g | PACK | Freq: Every day | ORAL | 2 refills | Status: DC
Start: 2019-11-28 — End: 2021-03-24

## 2019-11-28 NOTE — Progress Notes (Signed)
[name] MD, MRCP(U.K) [address]  [place]  [place], [place] [address]  Main: [phone]  Fax: [phone]   Gastroenterology Consultation  Referring Provider:     Volney American,* Primary Care Physician:  Volney American, [place] Primary Gastroenterologist:  Dr. [name]  Reason for Consultation:     Diarrhea        HPI:   Yesenia Stevens is a 54 y.o. y/o female referred for consultation & management  by  Volney American, PA-C.    Referred to see me for loose stools over 1 year duration with no improvement off Metformin   Diarrhea :  Onset: More than a year back Number of bowel movements a day : Over 4 bowel movements per day Color : Nonbloody Consistency: Watery Present status: Ongoing Shape of stool: No shape Weight loss: None Prior colonoscopy: None Artificial sugars/sodas/chewing gum: None Bloating: Yes Gas: Yes Antibiotic use: None Prior cholecystectomy    Past Medical History:  Diagnosis Date  . Diabetes mellitus without complication ([place])   . Hypertension     Past Surgical History:  Procedure Laterality Date  . CESAREAN SECTION    . CHOLECYSTECTOMY      Prior to Admission medications   Medication Sig Start Date End Date Taking? Authorizing Provider  albuterol (PROVENTIL HFA;VENTOLIN HFA) 108 (90 Base) MCG/ACT inhaler Inhale 1-2 puffs into the lungs every 6 (six) hours as needed for wheezing or shortness of breath. 07/19/16   Hagler, Jami L, PA-C  diclofenac sodium (VOLTAREN) 1 % GEL Apply 2 g topically 4 (four) times daily. 06/25/18   Volney American, PA-C  glipiZIDE (GLUCOTROL) 5 MG tablet Take 1 tablet by mouth twice daily 11/04/19   Volney American, PA-C  hydrochlorothiazide (HYDRODIURIL) 25 MG tablet Take 1 tablet by mouth once daily 10/23/19   Volney American, PA-C  lisinopril (ZESTRIL) 40 MG tablet Take 1 tablet by mouth once daily 11/21/19   Volney American, PA-C  metFORMIN  (GLUCOPHAGE) 1000 MG tablet Take 1 tablet (1,000 mg total) by mouth 2 (two) times daily with a meal. 10/07/19   Volney American, PA-C  sulfamethoxazole-trimethoprim (BACTRIM DS) 800-160 MG tablet Take 1 tablet by mouth 2 (two) times daily. Patient not taking: Reported on 11/28/2019 10/07/19   Volney American, PA-C    Family History  Problem Relation Age of Onset  . Hypertension Mother   . Diabetes Mother      Social History   Tobacco Use  . Smoking status: Never Smoker  . Smokeless tobacco: Never Used  Vaping Use  . Vaping Use: Never used  Substance Use Topics  . Alcohol use: Never    Alcohol/week: 0.0 standard drinks  . Drug use: Never    Allergies as of 11/28/2019  . (No Known Allergies)    Review of Systems:    All systems reviewed and negative except where noted in HPI.   Physical Exam:  Ht 5\' 6"  (1.676 m)   Wt (!) 324 lb 9.6 oz (147.2 kg)   BMI 52.39 kg/m  No LMP recorded. (Menstrual status: Perimenopausal). Psych:  Alert and cooperative. Normal mood and affect. General:   Alert,  Well-developed, well-nourished, pleasant and cooperative in NAD Head:  Normocephalic and atraumatic. Eyes:  Sclera clear, no icterus.   Conjunctiva pink. Lungs:  Respirations even and unlabored.  Clear throughout to auscultation.   No wheezes, crackles, or rhonchi. No acute distress. Heart:  Regular rate and rhythm; no murmurs,  clicks, rubs, or gallops. Abdomen:  Normal bowel sounds.  No bruits.  Soft, non-tender and non-distended without masses, hepatosplenomegaly or hernias noted.  No guarding or rebound tenderness.    Neurologic:  Alert and oriented x3;  grossly normal neurologically. Psych:  Alert and cooperative. Normal mood and affect.  Imaging Studies: No results found.  Assessment and Plan:   Yesenia Stevens is a 54 y.o. y/o female has been referred for diarrhea for over a year not responded to holding Metformin.  Nonbloody.  Status post cholecystectomy.   Differentials include IBS diarrhea versus bile salt diarrhea versus colitis  Plan 1.  Obtain stool studies fecal calprotectin, celiac serology and TSH 2.  Commence on Questran 1 sachet daily require dose can be increased 3.  Colonoscopy to rule out microscopic colitis   I have discussed alternative options, risks & benefits,  which include, but are not limited to, bleeding, infection, perforation,respiratory complication & drug reaction.  The patient agrees with this plan & written consent will be obtained.     Follow up in 6 weeks  Dr Wyline Mood MD,MRCP(U.K)

## 2019-11-29 LAB — TSH: TSH: 0.878 u[IU]/mL (ref 0.450–4.500)

## 2019-12-02 ENCOUNTER — Encounter: Payer: Self-pay | Admitting: Gastroenterology

## 2019-12-05 ENCOUNTER — Inpatient Hospital Stay: Admission: RE | Admit: 2019-12-05 | Payer: Managed Care, Other (non HMO) | Source: Ambulatory Visit

## 2019-12-06 ENCOUNTER — Other Ambulatory Visit: Payer: Self-pay

## 2019-12-06 ENCOUNTER — Other Ambulatory Visit
Admission: RE | Admit: 2019-12-06 | Discharge: 2019-12-06 | Disposition: A | Discharge status: Home / Self Care | Payer: Managed Care, Other (non HMO) | Source: Ambulatory Visit | Attending: Gastroenterology | Admitting: Gastroenterology

## 2019-12-06 ENCOUNTER — Encounter: Payer: Self-pay | Admitting: Gastroenterology

## 2019-12-06 DIAGNOSIS — Z20822 Contact with and (suspected) exposure to covid-19: Secondary | ICD-10-CM | POA: Diagnosis not present

## 2019-12-06 DIAGNOSIS — R197 Diarrhea, unspecified: Secondary | ICD-10-CM | POA: Diagnosis present

## 2019-12-06 DIAGNOSIS — Z538 Procedure and treatment not carried out for other reasons: Secondary | ICD-10-CM | POA: Diagnosis not present

## 2019-12-06 LAB — SARS CORONAVIRUS 2 (TAT 6-24 HRS): SARS Coronavirus 2: NEGATIVE

## 2019-12-09 ENCOUNTER — Encounter: Payer: Self-pay | Admitting: Gastroenterology

## 2019-12-09 ENCOUNTER — Other Ambulatory Visit: Payer: Self-pay

## 2019-12-09 ENCOUNTER — Encounter
Admission: RE | Disposition: A | Discharge status: Home / Self Care | Payer: Self-pay | Source: Home / Self Care | Attending: Gastroenterology

## 2019-12-09 ENCOUNTER — Ambulatory Visit
Admission: RE | Admit: 2019-12-09 | Discharge: 2019-12-09 | Disposition: A | Discharge status: Home / Self Care | Payer: Managed Care, Other (non HMO) | Attending: Gastroenterology | Admitting: Gastroenterology

## 2019-12-09 ENCOUNTER — Other Ambulatory Visit
Admission: RE | Admit: 2019-12-09 | Discharge: 2019-12-09 | Disposition: A | Discharge status: Home / Self Care | Payer: Managed Care, Other (non HMO) | Source: Ambulatory Visit | Attending: Gastroenterology | Admitting: Gastroenterology

## 2019-12-09 ENCOUNTER — Ambulatory Visit: Payer: Managed Care, Other (non HMO) | Admitting: Family Medicine

## 2019-12-09 DIAGNOSIS — R197 Diarrhea, unspecified: Secondary | ICD-10-CM | POA: Insufficient documentation

## 2019-12-09 DIAGNOSIS — Z20822 Contact with and (suspected) exposure to covid-19: Secondary | ICD-10-CM | POA: Insufficient documentation

## 2019-12-09 DIAGNOSIS — Z538 Procedure and treatment not carried out for other reasons: Secondary | ICD-10-CM | POA: Insufficient documentation

## 2019-12-09 LAB — C DIFFICILE QUICK SCREEN W PCR REFLEX
C Diff antigen: NEGATIVE
C Diff interpretation: NOT DETECTED
C Diff toxin: NEGATIVE

## 2019-12-09 LAB — GASTROINTESTINAL PANEL BY PCR, STOOL (REPLACES STOOL CULTURE)

## 2019-12-09 SURGERY — COLONOSCOPY WITH PROPOFOL
Anesthesia: General

## 2019-12-09 MED ORDER — NA SULFATE-K SULFATE-MG SULF 17.5-3.13-1.6 GM/177ML PO SOLN: 1.0000 | Freq: Once | ORAL | 0 refills | Status: AC

## 2019-12-09 NOTE — OR Nursing (Signed)
Dr. Tobi Bastos into see pt, he wants to reschedule pt's colonoscopy for tomorrow due to incomplete prep. Pt given arrival time of 8:45am and to pick up another split prep at her pharmacy, [facility] on [address].

## 2019-12-10 ENCOUNTER — Ambulatory Visit: Payer: Managed Care, Other (non HMO) | Admitting: Anesthesiology

## 2019-12-10 ENCOUNTER — Encounter: Payer: Self-pay | Admitting: Gastroenterology

## 2019-12-10 ENCOUNTER — Encounter
Admission: RE | Disposition: A | Discharge status: Home / Self Care | Payer: Self-pay | Source: Home / Self Care | Attending: Gastroenterology

## 2019-12-10 ENCOUNTER — Ambulatory Visit
Admission: RE | Admit: 2019-12-10 | Payer: Managed Care, Other (non HMO) | Source: Home / Self Care | Admitting: Gastroenterology

## 2019-12-10 ENCOUNTER — Ambulatory Visit
Admission: RE | Admit: 2019-12-10 | Discharge: 2019-12-10 | Disposition: A | Discharge status: Home / Self Care | Payer: Managed Care, Other (non HMO) | Attending: Gastroenterology | Admitting: Gastroenterology

## 2019-12-10 ENCOUNTER — Other Ambulatory Visit: Payer: Self-pay

## 2019-12-10 DIAGNOSIS — R197 Diarrhea, unspecified: Secondary | ICD-10-CM | POA: Diagnosis not present

## 2019-12-10 DIAGNOSIS — Z833 Family history of diabetes mellitus: Secondary | ICD-10-CM | POA: Diagnosis not present

## 2019-12-10 DIAGNOSIS — I10 Essential (primary) hypertension: Secondary | ICD-10-CM | POA: Diagnosis not present

## 2019-12-10 DIAGNOSIS — Z8249 Family history of ischemic heart disease and other diseases of the circulatory system: Secondary | ICD-10-CM | POA: Diagnosis not present

## 2019-12-10 DIAGNOSIS — Z7984 Long term (current) use of oral hypoglycemic drugs: Secondary | ICD-10-CM | POA: Diagnosis not present

## 2019-12-10 DIAGNOSIS — Z79899 Other long term (current) drug therapy: Secondary | ICD-10-CM | POA: Insufficient documentation

## 2019-12-10 DIAGNOSIS — Z6841 Body Mass Index (BMI) 40.0 and over, adult: Secondary | ICD-10-CM | POA: Insufficient documentation

## 2019-12-10 DIAGNOSIS — E119 Type 2 diabetes mellitus without complications: Secondary | ICD-10-CM | POA: Diagnosis not present

## 2019-12-10 HISTORY — PX: COLONOSCOPY WITH PROPOFOL: SHX5780

## 2019-12-10 LAB — GLUCOSE, CAPILLARY: Glucose-Capillary: 220 mg/dL — ABNORMAL HIGH (ref 70–99)

## 2019-12-10 SURGERY — COLONOSCOPY WITH PROPOFOL
Anesthesia: General

## 2019-12-10 MED ORDER — SODIUM CHLORIDE 0.9 % IV SOLN
INTRAVENOUS | Status: DC
  Administered 2019-12-10: 1000 mL via INTRAVENOUS

## 2019-12-10 MED ORDER — PROPOFOL 500 MG/50ML IV EMUL
INTRAVENOUS | Status: DC | PRN
  Administered 2019-12-10: 150 ug/kg/min via INTRAVENOUS

## 2019-12-10 NOTE — H&P (Signed)
Yesenia Bellows, MD [address] [place], [place], [place], [address] [address], [place], [place], [place], [address] Phone: [phone]  Fax: [phone]  Primary Care Physician:  Volney American, PA-C   Pre-Procedure History & Physical: HPI:  Yesenia Stevens is a 54 y.o. female is here for an colonoscopy.   Past Medical History:  Diagnosis Date  . Diabetes mellitus without complication ([place])   . Hypertension     Past Surgical History:  Procedure Laterality Date  . CESAREAN SECTION    . CHOLECYSTECTOMY      Prior to Admission medications   Medication Sig Start Date End Date Taking? Authorizing Provider  cholestyramine light (PREVALITE) 4 g packet Take 1 packet (4 g total) by mouth daily. 11/28/19 02/26/20 Yes Yesenia Bellows, MD  diclofenac sodium (VOLTAREN) 1 % GEL Apply 2 g topically 4 (four) times daily. 06/25/18  Yes Volney American, PA-C  glipiZIDE (GLUCOTROL) 5 MG tablet Take 1 tablet by mouth twice daily 11/04/19  Yes Volney American, PA-C  hydrochlorothiazide (HYDRODIURIL) 25 MG tablet Take 1 tablet by mouth once daily 10/23/19  Yes Volney American, PA-C  lisinopril (ZESTRIL) 40 MG tablet Take 1 tablet by mouth once daily 11/21/19  Yes Volney American, PA-C  metFORMIN (GLUCOPHAGE) 1000 MG tablet Take 1 tablet (1,000 mg total) by mouth 2 (two) times daily with a meal. 10/07/19  Yes Volney American, PA-C  albuterol (PROVENTIL HFA;VENTOLIN HFA) 108 (90 Base) MCG/ACT inhaler Inhale 1-2 puffs into the lungs every 6 (six) hours as needed for wheezing or shortness of breath. 07/19/16   Hagler, Jami L, PA-C  sulfamethoxazole-trimethoprim (BACTRIM DS) 800-160 MG tablet Take 1 tablet by mouth 2 (two) times daily. Patient not taking: Reported on 11/28/2019 10/07/19   Volney American, PA-C    Allergies as of 12/10/2019  . (No Known Allergies)    Family History  Problem Relation Age of Onset  . Hypertension Mother   . Diabetes  Mother     Social History   Socioeconomic History  . Marital status: Single    Spouse name: Not on file  . Number of children: Not on file  . Years of education: Not on file  . Highest education level: Not on file  Occupational History  . Not on file  Tobacco Use  . Smoking status: Never Smoker  . Smokeless tobacco: Never Used  Vaping Use  . Vaping Use: Never used  Substance and Sexual Activity  . Alcohol use: Never    Alcohol/week: 0.0 standard drinks  . Drug use: Never  . Sexual activity: Not Currently  Other Topics Concern  . Not on file  Social History Narrative  . Not on file   Social Determinants of Health   Financial Resource Strain:   . Difficulty of Paying Living Expenses:   Food Insecurity:   . Worried About Charity fundraiser in the Last Year:   . Arboriculturist in the Last Year:   Transportation Needs:   . Film/video editor (Medical):   Marland Kitchen Lack of Transportation (Non-Medical):   Physical Activity:   . Days of Exercise per Week:   . Minutes of Exercise per Session:   Stress:   . Feeling of Stress :   Social Connections:   . Frequency of Communication with Friends and Family:   . Frequency of Social Gatherings with Friends and Family:   . Attends Religious Services:   . Active  Member of Clubs or Organizations:   . Attends Banker Meetings:   Marland Kitchen Marital Status:   Intimate Partner Violence:   . Fear of Current or Ex-Partner:   . Emotionally Abused:   Marland Kitchen Physically Abused:   . Sexually Abused:     Review of Systems: See HPI, otherwise negative ROS  Physical Exam: BP (!) 180/105   Pulse 95   Temp (!) 96.9 F (36.1 C) (Temporal)   Resp 18   Ht 5\' 6"  (1.676 m)   Wt (!) 147.3 kg   LMP 08/20/2015 (Approximate) Comment: denies preg  SpO2 99%   BMI 52.40 kg/m  General:   Alert,  pleasant and cooperative in NAD Head:  Normocephalic and atraumatic. Neck:  Supple; no masses or thyromegaly. Lungs:  Clear throughout to  auscultation, normal respiratory effort.    Heart:  +S1, +S2, Regular rate and rhythm, No edema. Abdomen:  Soft, nontender and nondistended. Normal bowel sounds, without guarding, and without rebound.   Neurologic:  Alert and  oriented x4;  grossly normal neurologically.  Impression/Plan: Yesenia Stevens is here for an colonoscopy to be performed for diarrhea . Risks, benefits, limitations, and alternatives regarding  colonoscopy have been reviewed with the patient.  Questions have been answered.  All parties agreeable.   [name], MD  12/10/2019, 9:39 AM

## 2019-12-10 NOTE — Transfer of Care (Signed)
Immediate Anesthesia Transfer of Care Note  Patient: Yesenia Stevens  Procedure(s) Performed: COLONOSCOPY WITH PROPOFOL (N/A )  Patient Location: PACU  Anesthesia Type:General  Level of Consciousness: awake  Airway & Oxygen Therapy: Patient Spontanous Breathing and Patient connected to nasal cannula oxygen  Post-op Assessment: Report given to RN and Post -op Vital signs reviewed and stable  Post vital signs: Reviewed and stable  Last Vitals:  Vitals Value Taken Time  BP    Temp    Pulse    Resp    SpO2      Last Pain:  Vitals:   12/10/19 0926  TempSrc: Temporal  PainSc: 0-No pain         Complications: No complications documented.

## 2019-12-10 NOTE — Progress Notes (Signed)
Patient stated that she did not have the money to purchase an additional prep. Dr. Tobi Bastos encouraged Yesenia Stevens to go to his office to pick up prep and to reschedule colonoscopy. She is agreeable to this.

## 2019-12-10 NOTE — Anesthesia Procedure Notes (Signed)
Performed by: Cook-Martin, Geralynn Capri Pre-anesthesia Checklist: Patient identified, Emergency Drugs available, Suction available, Patient being monitored and Timeout performed Patient Re-evaluated:Patient Re-evaluated prior to induction Oxygen Delivery Method: Nasal cannula Preoxygenation: Pre-oxygenation with 100% oxygen Induction Type: IV induction Placement Confirmation: positive ETCO2 and CO2 detector       

## 2019-12-10 NOTE — Anesthesia Postprocedure Evaluation (Signed)
Anesthesia Post Note  Patient: Yesenia Stevens  Procedure(s) Performed: COLONOSCOPY WITH PROPOFOL (N/A )  Patient location during evaluation: Endoscopy Anesthesia Type: General Level of consciousness: awake and alert and oriented Pain management: pain level controlled Vital Signs Assessment: post-procedure vital signs reviewed and stable Respiratory status: spontaneous breathing Cardiovascular status: blood pressure returned to baseline Anesthetic complications: no   No complications documented.   Last Vitals:  Vitals:   12/10/19 1014 12/10/19 1024  BP: (!) 167/82 (!) (P) 161/98  Pulse:    Resp:    Temp:    SpO2:      Last Pain:  Vitals:   12/10/19 1024  TempSrc:   PainSc: (P) 0-No pain                 Tavari Loadholt

## 2019-12-10 NOTE — Brief Op Note (Signed)
Colonoscopy aborted due to solid stool in colon. Unable to visualize colon walls.

## 2019-12-10 NOTE — Anesthesia Preprocedure Evaluation (Signed)
Anesthesia Evaluation  Patient identified by MRN, date of birth, ID band Patient awake    Reviewed: Allergy & Precautions, NPO status , Patient's Chart, lab work & pertinent test results  Airway Mallampati: III       Dental  (+) Teeth Intact   Pulmonary neg pulmonary ROS,    Pulmonary exam normal        Cardiovascular hypertension, Normal cardiovascular exam     Neuro/Psych negative neurological ROS  negative psych ROS   GI/Hepatic Neg liver ROS,   Endo/Other  diabetesMorbid obesity  Renal/GU negative Renal ROS  negative genitourinary   Musculoskeletal   Abdominal (+) + obese,   Peds negative pediatric ROS (+)  Hematology   Anesthesia Other Findings   Reproductive/Obstetrics                             Anesthesia Physical Anesthesia Plan  ASA: III  Anesthesia Plan: General   Post-op Pain Management:    Induction: Intravenous  PONV Risk Score and Plan: Propofol infusion  Airway Management Planned: Nasal Cannula  Additional Equipment:   Intra-op Plan:   Post-operative Plan:   Informed Consent: I have reviewed the patients History and Physical, chart, labs and discussed the procedure including the risks, benefits and alternatives for the proposed anesthesia with the patient or authorized representative who has indicated his/her understanding and acceptance.     Dental advisory given  Plan Discussed with: CRNA and Surgeon  Anesthesia Plan Comments:         Anesthesia Quick Evaluation

## 2019-12-10 NOTE — Op Note (Signed)
[facility] Gastroenterology Patient Name: Yesenia Stevens Procedure Date: 12/10/2019 9:10 AM MRN: [id number] Account #: [id number] Date of Birth: 20-Feb-1966 Admit Type: Outpatient Age: 54 Room: [facility] ENDO ROOM 1 Gender: Female Note Status: Finalized Procedure:             Colonoscopy Indications:           Clinically significant diarrhea of unexplained origin Providers:             [name] MD, MD Medicines:             Monitored Anesthesia Care Complications:         No immediate complications. Procedure:             Pre-Anesthesia Assessment:                        - Prior to the procedure, a History and Physical was                         performed, and patient medications, allergies and                         sensitivities were reviewed. The patient's tolerance                         of previous anesthesia was reviewed.                        - The risks and benefits of the procedure and the                         sedation options and risks were discussed with the                         patient. All questions were answered and informed                         consent was obtained.                        - ASA Grade Assessment: II - A patient with mild                         systemic disease.                        After obtaining informed consent, the colonoscope was                         passed under direct vision. Throughout the procedure,                         the patient's blood pressure, pulse, and oxygen                         saturations were monitored continuously. The                         Colonoscope was introduced through the anus with the  intention of advancing to the cecum. The scope was                         advanced to the sigmoid colon before the procedure was                         aborted. Medications were given. The colonoscopy was                         performed with ease. The patient tolerated  the                         procedure well. The quality of the bowel preparation                         was inadequate. Findings:      The perianal and digital rectal examinations were normal.      Solid stool was found in the rectum and in the sigmoid colon, making       visualization difficult. Impression:            - Preparation of the colon was inadequate.                        - Stool in the rectum and in the sigmoid colon.                        - No specimens collected. Recommendation:        - Discharge patient to home (with escort).                        - Resume previous diet.                        - Continue present medications.                        - Repeat colonoscopy in 4 weeks because the bowel                         preparation was suboptimal.                        - Unlikely she has diarrhea as she has solid stool                         despite two bowel preps and likely suffers from severe                         constipation with overflow diarrhea Procedure Code(s):     --- Professional ---                        [phone], 53, Colonoscopy, flexible; diagnostic,                         including collection of specimen(s) by brushing or                         washing, when performed (separate  procedure) Diagnosis Code(s):     --- Professional ---                        R19.7, Diarrhea, unspecified CPT copyright 2019 American Medical Association. All rights reserved. The codes documented in this report are preliminary and upon coder review may  be revised to meet current compliance requirements. Wyline Mood, MD Wyline Mood MD, MD 12/10/2019 9:56:25 AM This report has been signed electronically. Number of Addenda: 0 Note Initiated On: 12/10/2019 9:10 AM Total Procedure Duration: 0 hours 0 minutes 42 seconds  Estimated Blood Loss:  Estimated blood loss: none.      [facility]

## 2019-12-11 ENCOUNTER — Other Ambulatory Visit: Payer: Self-pay

## 2019-12-11 ENCOUNTER — Encounter: Payer: Self-pay | Admitting: Gastroenterology

## 2019-12-11 DIAGNOSIS — R197 Diarrhea, unspecified: Secondary | ICD-10-CM

## 2019-12-13 LAB — CALPROTECTIN, FECAL: Calprotectin, Fecal: 56 ug/g (ref 0–120)

## 2019-12-20 ENCOUNTER — Other Ambulatory Visit
Admission: RE | Admit: 2019-12-20 | Discharge: 2019-12-20 | Disposition: A | Discharge status: Home / Self Care | Payer: Managed Care, Other (non HMO) | Source: Ambulatory Visit | Attending: Gastroenterology | Admitting: Gastroenterology

## 2019-12-20 ENCOUNTER — Other Ambulatory Visit: Payer: Self-pay

## 2019-12-20 DIAGNOSIS — Z20822 Contact with and (suspected) exposure to covid-19: Secondary | ICD-10-CM | POA: Diagnosis not present

## 2019-12-20 DIAGNOSIS — Z01812 Encounter for preprocedural laboratory examination: Secondary | ICD-10-CM | POA: Diagnosis present

## 2019-12-20 LAB — SARS CORONAVIRUS 2 (TAT 6-24 HRS): SARS Coronavirus 2: NEGATIVE

## 2019-12-23 ENCOUNTER — Other Ambulatory Visit: Payer: Self-pay | Admitting: Family Medicine

## 2019-12-24 ENCOUNTER — Encounter: Payer: Self-pay | Admitting: Gastroenterology

## 2019-12-24 ENCOUNTER — Ambulatory Visit
Admission: RE | Admit: 2019-12-24 | Discharge: 2019-12-24 | Disposition: A | Discharge status: Home / Self Care | Payer: Managed Care, Other (non HMO) | Attending: Gastroenterology | Admitting: Gastroenterology

## 2019-12-24 ENCOUNTER — Ambulatory Visit: Payer: Managed Care, Other (non HMO) | Admitting: Certified Registered Nurse Anesthetist

## 2019-12-24 ENCOUNTER — Encounter
Admission: RE | Disposition: A | Discharge status: Home / Self Care | Payer: Self-pay | Source: Home / Self Care | Attending: Gastroenterology

## 2019-12-24 DIAGNOSIS — R197 Diarrhea, unspecified: Secondary | ICD-10-CM | POA: Diagnosis not present

## 2019-12-24 DIAGNOSIS — Z79899 Other long term (current) drug therapy: Secondary | ICD-10-CM | POA: Diagnosis not present

## 2019-12-24 DIAGNOSIS — I10 Essential (primary) hypertension: Secondary | ICD-10-CM | POA: Diagnosis not present

## 2019-12-24 DIAGNOSIS — Z7984 Long term (current) use of oral hypoglycemic drugs: Secondary | ICD-10-CM | POA: Diagnosis not present

## 2019-12-24 DIAGNOSIS — K5730 Diverticulosis of large intestine without perforation or abscess without bleeding: Secondary | ICD-10-CM

## 2019-12-24 DIAGNOSIS — E119 Type 2 diabetes mellitus without complications: Secondary | ICD-10-CM | POA: Insufficient documentation

## 2019-12-24 HISTORY — PX: COLONOSCOPY WITH PROPOFOL: SHX5780

## 2019-12-24 LAB — GLUCOSE, CAPILLARY: Glucose-Capillary: 188 mg/dL — ABNORMAL HIGH (ref 70–99)

## 2019-12-24 SURGERY — COLONOSCOPY WITH PROPOFOL
Anesthesia: General

## 2019-12-24 MED ORDER — PROPOFOL 10 MG/ML IV BOLUS
INTRAVENOUS | Status: DC | PRN
  Administered 2019-12-24: 70 mg via INTRAVENOUS

## 2019-12-24 MED ORDER — PROPOFOL 500 MG/50ML IV EMUL
INTRAVENOUS | Status: DC | PRN
  Administered 2019-12-24: 100 ug/kg/min via INTRAVENOUS

## 2019-12-24 MED ORDER — LIDOCAINE HCL (CARDIAC) PF 100 MG/5ML IV SOSY
PREFILLED_SYRINGE | INTRAVENOUS | Status: DC | PRN
  Administered 2019-12-24: 100 mg via INTRAVENOUS

## 2019-12-24 MED ORDER — PROPOFOL 10 MG/ML IV BOLUS
INTRAVENOUS | Status: AC
  Filled 2019-12-24: qty 80

## 2019-12-24 MED ORDER — SODIUM CHLORIDE 0.9 % IV SOLN: INTRAVENOUS | Status: DC

## 2019-12-24 NOTE — H&P (Signed)
Wyline Mood, MD [address], [address], [place], [place], [address] [address], [place], [place], [address] Phone: [phone]  Fax: [phone]  Primary Care Physician:  Particia Nearing, PA-C   Pre-Procedure History & Physical: HPI:  Edwin Cherian is a 54 y.o. female is here for an colonoscopy.   Past Medical History:  Diagnosis Date  . Diabetes mellitus without complication (HCC)   . Hypertension     Past Surgical History:  Procedure Laterality Date  . CESAREAN SECTION    . CHOLECYSTECTOMY    . COLONOSCOPY WITH PROPOFOL N/A 12/10/2019   Procedure: COLONOSCOPY WITH PROPOFOL;  Surgeon: Wyline Mood, MD;  Location: [facility] ENDOSCOPY;  Service: Gastroenterology;  Laterality: N/A;    Prior to Admission medications   Medication Sig Start Date End Date Taking? Authorizing Provider  hydrochlorothiazide (HYDRODIURIL) 25 MG tablet Take 1 tablet by mouth once daily 10/23/19  Yes Particia Nearing, PA-C  lisinopril (ZESTRIL) 40 MG tablet Take 1 tablet by mouth once daily 11/21/19  Yes Particia Nearing, PA-C  albuterol (PROVENTIL HFA;VENTOLIN HFA) 108 (90 Base) MCG/ACT inhaler Inhale 1-2 puffs into the lungs every 6 (six) hours as needed for wheezing or shortness of breath. 07/19/16   Hagler, Jami L, PA-C  cholestyramine light (PREVALITE) 4 g packet Take 1 packet (4 g total) by mouth daily. 11/28/19 02/26/20  Wyline Mood, MD  diclofenac sodium (VOLTAREN) 1 % GEL Apply 2 g topically 4 (four) times daily. 06/25/18   Particia Nearing, PA-C  glipiZIDE (GLUCOTROL) 5 MG tablet Take 1 tablet by mouth twice daily 11/04/19   Particia Nearing, PA-C  metFORMIN (GLUCOPHAGE) 1000 MG tablet TAKE 1 TABLET BY MOUTH TWICE DAILY WITH  A  MEAL 12/24/19   Particia Nearing, PA-C  sulfamethoxazole-trimethoprim (BACTRIM DS) 800-160 MG tablet Take 1 tablet by mouth 2 (two) times daily. Patient not taking: Reported on 11/28/2019 10/07/19   Particia Nearing, PA-C     Allergies as of 12/12/2019  . (No Known Allergies)    Family History  Problem Relation Age of Onset  . Hypertension Mother   . Diabetes Mother     Social History   Socioeconomic History  . Marital status: Single    Spouse name: Not on file  . Number of children: Not on file  . Years of education: Not on file  . Highest education level: Not on file  Occupational History  . Not on file  Tobacco Use  . Smoking status: Never Smoker  . Smokeless tobacco: Never Used  Vaping Use  . Vaping Use: Never used  Substance and Sexual Activity  . Alcohol use: Never    Alcohol/week: 0.0 standard drinks  . Drug use: Never  . Sexual activity: Not Currently  Other Topics Concern  . Not on file  Social History Narrative  . Not on file   Social Determinants of Health   Financial Resource Strain:   . Difficulty of Paying Living Expenses:   Food Insecurity:   . Worried About Programme researcher, broadcasting/film/video in the Last Year:   . Barista in the Last Year:   Transportation Needs:   . Freight forwarder (Medical):   Marland Kitchen Lack of Transportation (Non-Medical):   Physical Activity:   . Days of Exercise per Week:   . Minutes of Exercise per Session:   Stress:   . Feeling of Stress :   Social Connections:   . Frequency of Communication  with Friends and Family:   . Frequency of Social Gatherings with Friends and Family:   . Attends Religious Services:   . Active Member of Clubs or Organizations:   . Attends Banker Meetings:   Marland Kitchen Marital Status:   Intimate Partner Violence:   . Fear of Current or Ex-Partner:   . Emotionally Abused:   Marland Kitchen Physically Abused:   . Sexually Abused:     Review of Systems: See HPI, otherwise negative ROS  Physical Exam: BP (!) 154/94   Pulse 85   Temp (!) 97.1 F (36.2 C) (Temporal)   Resp 17   Ht 5\' 6"  (1.676 m)   Wt (!) 145.2 kg   LMP 08/20/2015 (Approximate) Comment: denies preg  SpO2 99%   BMI 51.65 kg/m  General:   Alert,   pleasant and cooperative in NAD Head:  Normocephalic and atraumatic. Neck:  Supple; no masses or thyromegaly. Lungs:  Clear throughout to auscultation, normal respiratory effort.    Heart:  +S1, +S2, Regular rate and rhythm, No edema. Abdomen:  Soft, nontender and nondistended. Normal bowel sounds, without guarding, and without rebound.   Neurologic:  Alert and  oriented x4;  grossly normal neurologically.  Impression/Plan: Dione Petron is here for an colonoscopy to be performed for diarrhea .Risks, benefits, limitations, and alternatives regarding  colonoscopy have been reviewed with the patient.  Questions have been answered.  All parties agreeable.   [name], MD  12/24/2019, 8:05 AM

## 2019-12-24 NOTE — Op Note (Signed)
[facility] Gastroenterology Patient Name: Yesenia Stevens Procedure Date: 12/24/2019 8:12 AM MRN: [id number] Account #: [id number] Date of Birth: January 05, 1966 Admit Type: Outpatient Age: 54 Room: [facility] ENDO ROOM 1 Gender: Female Note Status: Finalized Procedure:             Colonoscopy Indications:           Clinically significant diarrhea of unexplained origin Providers:             Wyline Mood MD, MD Referring MD:          Particia Nearing (Referring MD) Medicines:             Monitored Anesthesia Care Complications:         No immediate complications. Procedure:             Pre-Anesthesia Assessment:                        - Prior to the procedure, a History and Physical was                         performed, and patient medications, allergies and                         sensitivities were reviewed. The patient's tolerance                         of previous anesthesia was reviewed.                        - The risks and benefits of the procedure and the                         sedation options and risks were discussed with the                         patient. All questions were answered and informed                         consent was obtained.                        - ASA Grade Assessment: II - A patient with mild                         systemic disease.                        After obtaining informed consent, the colonoscope was                         passed under direct vision. Throughout the procedure,                         the patient's blood pressure, pulse, and oxygen                         saturations were monitored continuously. The                         Colonoscope was introduced through the anus  and                         advanced to the the cecum, identified by the                         appendiceal orifice. The colonoscopy was performed                         with ease. The patient tolerated the procedure well.                          The quality of the bowel preparation was excellent. Findings:      The perianal and digital rectal examinations were normal.      A few small and large-mouthed diverticula were found in the sigmoid       colon.      The entire examined colon appeared normal on direct and retroflexion       views.      Normal mucosa was found in the entire colon. Biopsies were taken with a       cold forceps for histology. Impression:            - Diverticulosis in the sigmoid colon.                        - The entire examined colon is normal on direct and                         retroflexion views.                        - No specimens collected. Recommendation:        - Discharge patient to home (with escort).                        - Resume previous diet.                        - Continue present medications.                        - Await pathology results.                        - Repeat colonoscopy in 10 years for screening                         purposes. Procedure Code(s):     --- Professional ---                        [phone], Colonoscopy, flexible; with biopsy, single or                         multiple Diagnosis Code(s):     --- Professional ---                        R19.7, Diarrhea, unspecified                        K57.30, Diverticulosis of large intestine without  perforation or abscess without bleeding CPT copyright 2019 American Medical Association. All rights reserved. The codes documented in this report are preliminary and upon coder review may  be revised to meet current compliance requirements. Wyline Mood, MD Wyline Mood MD, MD 12/24/2019 8:31:15 AM This report has been signed electronically. Number of Addenda: 0 Note Initiated On: 12/24/2019 8:12 AM Scope Withdrawal Time: 0 hours 8 minutes 50 seconds  Total Procedure Duration: 0 hours 10 minutes 58 seconds  Estimated Blood Loss:  Estimated blood loss: none.      [facility]

## 2019-12-24 NOTE — Anesthesia Preprocedure Evaluation (Signed)
Anesthesia Evaluation  Patient identified by MRN, date of birth, ID band Patient awake    Reviewed: Allergy & Precautions, NPO status , Patient's Chart, lab work & pertinent test results  History of Anesthesia Complications Negative for: history of anesthetic complications  Airway Mallampati: III       Dental  (+) Teeth Intact, Dental Advidsory Given   Pulmonary neg pulmonary ROS,    Pulmonary exam normal        Cardiovascular Exercise Tolerance: Good hypertension, (-) angina(-) Past MI and (-) Cardiac Stents Normal cardiovascular exam(-) dysrhythmias (-) Valvular Problems/Murmurs     Neuro/Psych negative neurological ROS  negative psych ROS   GI/Hepatic negative GI ROS, Neg liver ROS,   Endo/Other  diabetesMorbid obesity  Renal/GU negative Renal ROS  negative genitourinary   Musculoskeletal   Abdominal (+) + obese,   Peds negative pediatric ROS (+)  Hematology   Anesthesia Other Findings Past Medical History: No date: Diabetes mellitus without complication (HCC) No date: Hypertension   Reproductive/Obstetrics                             Anesthesia Physical  Anesthesia Plan  ASA: III  Anesthesia Plan: General   Post-op Pain Management:    Induction: Intravenous  PONV Risk Score and Plan: Propofol infusion and TIVA  Airway Management Planned: Nasal Cannula  Additional Equipment:   Intra-op Plan:   Post-operative Plan:   Informed Consent: I have reviewed the patients History and Physical, chart, labs and discussed the procedure including the risks, benefits and alternatives for the proposed anesthesia with the patient or authorized representative who has indicated his/her understanding and acceptance.     Dental advisory given  Plan Discussed with: CRNA and Surgeon  Anesthesia Plan Comments:         Anesthesia Quick Evaluation

## 2019-12-24 NOTE — Anesthesia Postprocedure Evaluation (Signed)
Anesthesia Post Note  Patient: Yesenia Stevens  Procedure(s) Performed: COLONOSCOPY WITH PROPOFOL (N/A )  Patient location during evaluation: Endoscopy Anesthesia Type: General Level of consciousness: awake and alert Pain management: pain level controlled Vital Signs Assessment: post-procedure vital signs reviewed and stable Respiratory status: spontaneous breathing, nonlabored ventilation, respiratory function stable and patient connected to nasal cannula oxygen Cardiovascular status: blood pressure returned to baseline and stable Postop Assessment: no apparent nausea or vomiting Anesthetic complications: no   No complications documented.   Last Vitals:  Vitals:   12/24/19 0844 12/24/19 0854  BP: 127/84 131/74  Pulse: 85 81  Resp: 18 17  Temp:    SpO2: 100% 100%    Last Pain:  Vitals:   12/24/19 0854  TempSrc:   PainSc: 0-No pain                 Lenard Simmer

## 2019-12-24 NOTE — Transfer of Care (Signed)
Immediate Anesthesia Transfer of Care Note  Patient: Yesenia Stevens  Procedure(s) Performed: COLONOSCOPY WITH PROPOFOL (N/A )  Patient Location: PACU and Endoscopy Unit  Anesthesia Type:General  Level of Consciousness: awake, oriented, drowsy and patient cooperative  Airway & Oxygen Therapy: Patient Spontanous Breathing  Post-op Assessment: Report given to RN, Post -op Vital signs reviewed and stable and Patient moving all extremities  Post vital signs: Reviewed and stable  Last Vitals:  Vitals Value Taken Time  BP 118/75 12/24/19 0834  Temp 36.3 C 12/24/19 0834  Pulse 88 12/24/19 0835  Resp 12 12/24/19 0835  SpO2 99 % 12/24/19 0835  Vitals shown include unvalidated device data.  Last Pain:  Vitals:   12/24/19 0834  TempSrc: Temporal  PainSc: 0-No pain         Complications: No complications documented.

## 2019-12-25 ENCOUNTER — Other Ambulatory Visit: Payer: Self-pay | Admitting: Family Medicine

## 2019-12-25 ENCOUNTER — Encounter: Payer: Self-pay | Admitting: Gastroenterology

## 2019-12-25 LAB — SURGICAL PATHOLOGY

## 2019-12-25 NOTE — Telephone Encounter (Signed)
Requested Prescriptions  Pending Prescriptions Disp Refills   lisinopril (ZESTRIL) 40 MG tablet [Pharmacy Med Name: Lisinopril 40 MG Oral Tablet] 30 tablet 0    Sig: Take 1 tablet by mouth once daily     Cardiovascular:  ACE Inhibitors Passed - 12/25/2019  5:30 AM      Passed - Cr in normal range and within 180 days    Creatinine, Ser  Date Value Ref Range Status  09/11/2019 0.86 0.57 - 1.00 mg/dL Final         Passed - K in normal range and within 180 days    Potassium  Date Value Ref Range Status  09/11/2019 4.3 3.5 - 5.2 mmol/L Final         Passed - Patient is not pregnant      Passed - Last BP in normal range    BP Readings from Last 1 Encounters:  12/24/19 131/74         Passed - Valid encounter within last 6 months    Recent Outpatient Visits          2 months ago Essential hypertension   [facility] Roosvelt Maser [place], [place]   3 months ago Diabetes mellitus without complication [facility])   [facility] Particia Nearing, [place]   4 months ago Acute lower UTI   [facility], [place], [place]   4 months ago Essential hypertension   [facility] Roosvelt Maser [place], [place]   1 year ago Palpitations   [facility] [place] [place], [place]ley He[place], [place]      Future Appointments            In 1 month Wyline Mood, MD [facility]

## 2020-01-02 ENCOUNTER — Encounter: Payer: Self-pay | Admitting: Gastroenterology

## 2020-02-11 ENCOUNTER — Ambulatory Visit: Payer: Managed Care, Other (non HMO) | Admitting: Gastroenterology

## 2020-02-17 ENCOUNTER — Ambulatory Visit: Payer: Managed Care, Other (non HMO) | Admitting: Gastroenterology

## 2020-03-19 ENCOUNTER — Other Ambulatory Visit: Payer: Self-pay

## 2020-03-19 ENCOUNTER — Ambulatory Visit (INDEPENDENT_AMBULATORY_CARE_PROVIDER_SITE_OTHER): Payer: Managed Care, Other (non HMO) | Admitting: Nurse Practitioner

## 2020-03-19 ENCOUNTER — Encounter: Payer: Self-pay | Admitting: Nurse Practitioner

## 2020-03-19 VITALS — BP 119/76 | HR 90 | Temp 99.3°F | Ht 64.5 in | Wt 314.4 lb

## 2020-03-19 DIAGNOSIS — I10 Essential (primary) hypertension: Secondary | ICD-10-CM

## 2020-03-19 DIAGNOSIS — Z1231 Encounter for screening mammogram for malignant neoplasm of breast: Secondary | ICD-10-CM | POA: Diagnosis not present

## 2020-03-19 DIAGNOSIS — E785 Hyperlipidemia, unspecified: Secondary | ICD-10-CM

## 2020-03-19 DIAGNOSIS — R197 Diarrhea, unspecified: Secondary | ICD-10-CM | POA: Insufficient documentation

## 2020-03-19 DIAGNOSIS — E119 Type 2 diabetes mellitus without complications: Secondary | ICD-10-CM

## 2020-03-19 DIAGNOSIS — E1169 Type 2 diabetes mellitus with other specified complication: Secondary | ICD-10-CM | POA: Diagnosis not present

## 2020-03-19 DIAGNOSIS — Z1329 Encounter for screening for other suspected endocrine disorder: Secondary | ICD-10-CM | POA: Diagnosis not present

## 2020-03-19 DIAGNOSIS — Z0000 Encounter for general adult medical examination without abnormal findings: Secondary | ICD-10-CM | POA: Diagnosis not present

## 2020-03-19 DIAGNOSIS — R002 Palpitations: Secondary | ICD-10-CM | POA: Diagnosis not present

## 2020-03-19 MED ORDER — METFORMIN HCL 1000 MG PO TABS: 1000.0000 mg | ORAL_TABLET | Freq: Two times a day (BID) | ORAL | 0 refills | Status: DC

## 2020-03-19 MED ORDER — GLIPIZIDE 5 MG PO TABS: 5.0000 mg | ORAL_TABLET | Freq: Two times a day (BID) | ORAL | 0 refills | Status: DC

## 2020-03-19 MED ORDER — HYDROCHLOROTHIAZIDE 25 MG PO TABS: 25.0000 mg | ORAL_TABLET | Freq: Every day | ORAL | 0 refills | Status: DC

## 2020-03-19 NOTE — Progress Notes (Signed)
BP 119/76   Pulse 90   Temp 99.3 F (37.4 C) (Oral)   Ht 5' 4.5" (1.638 m)   Wt (!) 314 lb 6.4 oz (142.6 kg)   LMP 08/20/2015 (Approximate) Comment: denies preg  SpO2 98%   BMI 53.13 kg/m    Subjective:    Patient ID: Yesenia Stevens, female    DOB: Nov 23, 1965, 54 y.o.   MRN: [id number]  HPI: Yesenia Stevens is a 54 y.o. female presenting on 03/19/2020 for comprehensive medical examination. Current medical complaints include:  HYPERTENSION  Hypertension status: controlled  Satisfied with current treatment? yes Duration of hypertension: chronic BP monitoring frequency:  Not checking BP medication side effects:  no Medication compliance: excellent compliance Previous BP meds: glipizide 5 mg bid, metformin 1000 mg bid Aspirin: no Recurrent headaches: no Visual changes: no Palpitations: yes Dyspnea: no Chest pain: no Lower extremity edema: no Dizzy/lightheaded: no  DIABETES Hypoglycemic episodes:no Polydipsia/polyuria: no Visual disturbance: no twice daily. Chest pain: no Paresthesias: no Glucose Monitoring: no  Blood Pressure Monitoring: not checking Retinal Examination: Up to Date Foot Exam: performed today  Diabetic Education: Completed Pneumovax: administered today Influenza: Not up to Date  Aspirin: no  She currently lives with: self; no pets Menopausal Symptoms: no  Depression Screen done today and results listed below:  Depression screen [facility] 2/9 08/12/2019 07/06/2018  Decreased Interest 0 0  Down, Depressed, Hopeless 0 0  PHQ - 2 Score 0 0  Altered sleeping - 0  Tired, decreased energy - 0  Change in appetite - 0  Feeling bad or failure about yourself  - 0  Trouble concentrating - 0  Moving slowly or fidgety/restless - 0  Suicidal thoughts - 0  PHQ-9 Score - 0  Difficult doing work/chores - Not difficult at all    The patient does not have a history of falls. I did not complete a risk assessment for falls. A plan of care for falls was not  documented.   Past Medical History:  Past Medical History:  Diagnosis Date  . Diabetes mellitus without complication (HCC)   . Hypertension     Surgical History:  Past Surgical History:  Procedure Laterality Date  . CESAREAN SECTION    . CHOLECYSTECTOMY    . COLONOSCOPY WITH PROPOFOL N/A 12/10/2019   Procedure: COLONOSCOPY WITH PROPOFOL;  Surgeon: Wyline Mood, MD;  Location: [facility] ENDOSCOPY;  Service: Gastroenterology;  Laterality: N/A;  . COLONOSCOPY WITH PROPOFOL N/A 12/24/2019   Procedure: COLONOSCOPY WITH PROPOFOL;  Surgeon: Wyline Mood, MD;  Location: [facility] ENDOSCOPY;  Service: Gastroenterology;  Laterality: N/A;    Medications:  Current Outpatient Medications on File Prior to Visit  Medication Sig  . albuterol (PROVENTIL HFA;VENTOLIN HFA) 108 (90 Base) MCG/ACT inhaler Inhale 1-2 puffs into the lungs every 6 (six) hours as needed for wheezing or shortness of breath.  . diclofenac sodium (VOLTAREN) 1 % GEL Apply 2 g topically 4 (four) times daily.  Marland Kitchen glipiZIDE (GLUCOTROL) 5 MG tablet Take 1 tablet by mouth twice daily  . hydrochlorothiazide (HYDRODIURIL) 25 MG tablet Take 1 tablet by mouth once daily  . lisinopril (ZESTRIL) 40 MG tablet Take 1 tablet by mouth once daily  . metFORMIN (GLUCOPHAGE) 1000 MG tablet TAKE 1 TABLET BY MOUTH TWICE DAILY WITH  A  MEAL  . cholestyramine light (PREVALITE) 4 g packet Take 1 packet (4 g total) by mouth daily.   No current facility-administered medications on file prior to visit.    Allergies:  No Known Allergies  Social History:  Social History   Socioeconomic History  . Marital status: Single    Spouse name: Not on file  . Number of children: Not on file  . Years of education: Not on file  . Highest education level: Not on file  Occupational History  . Not on file  Tobacco Use  . Smoking status: Never Smoker  . Smokeless tobacco: Never Used  Vaping Use  . Vaping Use: Never used  Substance and Sexual Activity  . Alcohol use:  Never    Alcohol/week: 0.0 standard drinks  . Drug use: Never  . Sexual activity: Not Currently  Other Topics Concern  . Not on file  Social History Narrative  . Not on file   Social Determinants of Health   Financial Resource Strain:   . Difficulty of Paying Living Expenses: Not on file  Food Insecurity:   . Worried About Programme researcher, broadcasting/film/video in the Last Year: Not on file  . Ran Out of Food in the Last Year: Not on file  Transportation Needs:   . Lack of Transportation (Medical): Not on file  . Lack of Transportation (Non-Medical): Not on file  Physical Activity:   . Days of Exercise per Week: Not on file  . Minutes of Exercise per Session: Not on file  Stress:   . Feeling of Stress : Not on file  Social Connections:   . Frequency of Communication with Friends and Family: Not on file  . Frequency of Social Gatherings with Friends and Family: Not on file  . Attends Religious Services: Not on file  . Active Member of Clubs or Organizations: Not on file  . Attends Banker Meetings: Not on file  . Marital Status: Not on file  Intimate Partner Violence:   . Fear of Current or Ex-Partner: Not on file  . Emotionally Abused: Not on file  . Physically Abused: Not on file  . Sexually Abused: Not on file   Social History   Tobacco Use  Smoking Status Never Smoker  Smokeless Tobacco Never Used   Social History   Substance and Sexual Activity  Alcohol Use Never  . Alcohol/week: 0.0 standard drinks    Family History:  Family History  Problem Relation Age of Onset  . Hypertension Mother   . Diabetes Mother     Past medical history, surgical history, medications, allergies, family history and social history reviewed with patient today and changes made to appropriate areas of the chart.   Review of Systems - Review of Systems  Constitutional: Negative.  Negative for chills, diaphoresis, fever, malaise/fatigue and weight loss.  HENT: Negative.  Negative for ear  discharge, ear pain, hearing loss, sinus pain, sore throat and tinnitus.   Eyes: Negative.  Negative for blurred vision, double vision, pain, discharge and redness.  Respiratory: Negative.  Negative for cough, shortness of breath and wheezing.   Cardiovascular: Positive for palpitations (have not changed from previously). Negative for chest pain and leg swelling.  Gastrointestinal: Positive for diarrhea (following with GI). Negative for abdominal pain, blood in stool, constipation, heartburn, nausea and vomiting.  Genitourinary: Negative.   Musculoskeletal: Negative.  Negative for back pain, myalgias and neck pain.  Skin: Negative.  Negative for itching and rash.  Neurological: Negative.  Negative for dizziness and headaches.  Psychiatric/Behavioral: Negative.  Negative for depression. The patient is not nervous/anxious and does not have insomnia.    All other ROS negative except what is listed above and in the  HPI.      Objective:    BP 119/76   Pulse 90   Temp 99.3 F (37.4 C) (Oral)   Ht 5' 4.5" (1.638 m)   Wt (!) 314 lb 6.4 oz (142.6 kg)   LMP 08/20/2015 (Approximate) Comment: denies preg  SpO2 98%   BMI 53.13 kg/m   Wt Readings from Last 3 Encounters:  03/19/20 (!) 314 lb 6.4 oz (142.6 kg)  12/24/19 (!) 320 lb (145.2 kg)  12/10/19 (!) 324 lb 10.7 oz (147.3 kg)    Physical Exam Vitals and nursing note reviewed.  Constitutional:      General: She is not in acute distress.    Appearance: Normal appearance. She is obese. She is not ill-appearing, toxic-appearing or diaphoretic.  HENT:     Head: Normocephalic and atraumatic.     Right Ear: Tympanic membrane, ear canal and external ear normal. There is no impacted cerumen.     Left Ear: Tympanic membrane, ear canal and external ear normal. There is no impacted cerumen.     Nose: Nose normal. No congestion or rhinorrhea.     Mouth/Throat:     Mouth: Mucous membranes are moist.     Pharynx: Oropharynx is clear. No  oropharyngeal exudate.  Eyes:     General: No scleral icterus.    Extraocular Movements: Extraocular movements intact.     Pupils: Pupils are equal, round, and reactive to light.  Neck:     Vascular: No carotid bruit.  Cardiovascular:     Rate and Rhythm: Normal rate and regular rhythm.     Pulses: Normal pulses.     Heart sounds: Normal heart sounds. No murmur heard.   Pulmonary:     Effort: Pulmonary effort is normal. No respiratory distress.     Breath sounds: Normal breath sounds. No wheezing or rhonchi.  Chest:     Breasts:        Right: Normal. No inverted nipple, mass, nipple discharge or skin change.        Left: Normal. No inverted nipple, mass, nipple discharge or skin change.  Abdominal:     General: Abdomen is flat. Bowel sounds are normal. There is no distension.     Palpations: Abdomen is soft.     Tenderness: There is no abdominal tenderness.  Musculoskeletal:        General: No swelling or tenderness. Normal range of motion.     Cervical back: Normal range of motion and neck supple. No rigidity or tenderness.     Right lower leg: No edema.     Left lower leg: No edema.  Skin:    General: Skin is warm and dry.     Capillary Refill: Capillary refill takes less than 2 seconds.     Coloration: Skin is not jaundiced or pale.  Neurological:     General: No focal deficit present.     Mental Status: She is alert and oriented to person, place, and time.     Motor: No weakness.     Gait: Gait normal.  Psychiatric:        Mood and Affect: Mood normal.        Behavior: Behavior normal.        Thought Content: Thought content normal.        Judgment: Judgment normal.       Assessment & Plan:   Problem List Items Addressed This Visit      Cardiovascular and Mediastinum   Hypertension  Chronic, stable on hydrochlorothiazide 25 mg and lisinopril 40 mg.  Blood pressure at goal today in office.  Encourage patient to check blood pressure at home to be sure is  maintaining below goal of 130/80.  Continue current medications for now, refill sent in.  CMP, CBC, TSH checked today.  Follow-up in 3 months.        Endocrine   Diabetes mellitus without complication (HCC)    Chronic, ongoing.  Last A1c in March 11.4%.  Currently taking Metformin 1000 mg twice daily and glipizide 5 mg twice daily.  Continue these medications for now-May need to adjust after A1c returns.  Foot exam done today, eye exam up-to-date.  CMP checked today.  Follow-up in 3 months or sooner if needs arise.      Relevant Orders   CBC with Differential/Platelet   Comprehensive metabolic panel   HgB A1c     Other   Morbid (severe) obesity due to excess calories (HCC)    Discussed dietary lifestyle changes.  With goal of 30 minutes of exercise 5 times daily.      Relevant Orders   Lipid Panel w/o Chol/HDL Ratio   CBC with Differential/Platelet   Comprehensive metabolic panel   Palpitations    Chronic, stable.  Was evaluated with cardiologist including Holter monitor and echocardiogram.  Reports palpitations are not getting worse.  Due for follow-up with cardiology-recommended scheduling an appointment ASAP.      Diarrhea    Chronic, ongoing.  Unclear etiology, is following with GI.  Appointment next month.  Continue collaboration with GI.       Other Visit Diagnoses    Annual physical exam    -  Primary   Relevant Orders   CBC with Differential/Platelet   Comprehensive metabolic panel   Hyperlipidemia associated with type 2 diabetes mellitus (HCC)       Encounter for screening mammogram for malignant neoplasm of breast       Relevant Orders   MM 3D SCREEN BREAST BILATERAL   Screening for thyroid disorder       Relevant Orders   TSH       Follow up plan: Return in about 3 months (around 06/18/2020) for dm, htn.   LABORATORY TESTING:  - Pap smear: up to date  IMMUNIZATIONS:   - Tdap: Tetanus vaccination status reviewed: last tetanus booster within 10  years. - Influenza: Refused - Pneumovax: Refused - Prevnar: Not applicable - HPV: Not applicable - Zostavax vaccine: Not applicable  SCREENING: -Mammogram: Ordered today  - Colonoscopy: Up to date  - Bone Density: Not applicable  -Hearing Test: Not applicable  -Spirometry: Not applicable   PATIENT COUNSELING:   Advised to take 1 mg of folate supplement per day if capable of pregnancy.   Sexuality: Discussed sexually transmitted diseases, partner selection, use of condoms, avoidance of unintended pregnancy  and contraceptive alternatives.   Advised to avoid cigarette smoking.  I discussed with the patient that most people either abstain from alcohol or drink within safe limits (<=14/week and <=4 drinks/occasion for males, <=7/weeks and <= 3 drinks/occasion for females) and that the risk for alcohol disorders and other health effects rises proportionally with the number of drinks per week and how often a drinker exceeds daily limits.  Discussed cessation/primary prevention of drug use and availability of treatment for abuse.   Diet: Encouraged to adjust caloric intake to maintain  or achieve ideal body weight, to reduce intake of dietary saturated fat and total fat,  to limit sodium intake by avoiding high sodium foods and not adding table salt, and to maintain adequate dietary potassium and calcium preferably from fresh fruits, vegetables, and low-fat dairy products.    stressed the importance of regular exercise  Injury prevention: Discussed safety belts, safety helmets, smoke detector, smoking near bedding or upholstery.   Dental health: Discussed importance of regular tooth brushing, flossing, and dental visits.    NEXT PREVENTATIVE PHYSICAL DUE IN 1 YEAR. Return in about 3 months (around 06/18/2020) for dm, htn.

## 2020-03-19 NOTE — Patient Instructions (Signed)
[facility]  Address: [address], [place], [place] [address]  Phone: [phone]    Preventive Care 40-52 Years Old, Female Preventive care refers to visits with your health care provider and lifestyle choices that can promote health and wellness. This includes:  A yearly physical exam. This may also be called an annual well check.  Regular dental visits and eye exams.  Immunizations.  Screening for certain conditions.  Healthy lifestyle choices, such as eating a healthy diet, getting regular exercise, not using drugs or products that contain nicotine and tobacco, and limiting alcohol use. What can I expect for my preventive care visit? Physical exam Your health care provider will check your:  Height and weight. This may be used to calculate body mass index (BMI), which tells if you are at a healthy weight.  Heart rate and blood pressure.  Skin for abnormal spots. Counseling Your health care provider may ask you questions about your:  Alcohol, tobacco, and drug use.  Emotional well-being.  Home and relationship well-being.  Sexual activity.  Eating habits.  Work and work environment.  Method of birth control.  Menstrual cycle.  Pregnancy history. What immunizations do I need?  Influenza (flu) vaccine  This is recommended every year. Tetanus, diphtheria, and pertussis (Tdap) vaccine  You may need a Td booster every 10 years. Varicella (chickenpox) vaccine  You may need this if you have not been vaccinated. Zoster (shingles) vaccine  You may need this after age 60. Measles, mumps, and rubella (MMR) vaccine  You may need at least one dose of MMR if you were born in 1957 or later. You may also need a second dose. Pneumococcal conjugate (PCV13) vaccine  You may need this if you have certain conditions and were not previously vaccinated. Pneumococcal polysaccharide (PPSV23) vaccine  You may need one or two  doses if you smoke cigarettes or if you have certain conditions. Meningococcal conjugate (MenACWY) vaccine  You may need this if you have certain conditions. Hepatitis A vaccine  You may need this if you have certain conditions or if you travel or work in places where you may be exposed to hepatitis A. Hepatitis B vaccine  You may need this if you have certain conditions or if you travel or work in places where you may be exposed to hepatitis B. Haemophilus influenzae type b (Hib) vaccine  You may need this if you have certain conditions. Human papillomavirus (HPV) vaccine  If recommended by your health care provider, you may need three doses over 6 months. You may receive vaccines as individual doses or as more than one vaccine together in one shot (combination vaccines). Talk with your health care provider about the risks and benefits of combination vaccines. What tests do I need? Blood tests  Lipid and cholesterol levels. These may be checked every 5 years, or more frequently if you are over 50 years old.  Hepatitis C test.  Hepatitis B test. Screening  Lung cancer screening. You may have this screening every year starting at age 55 if you have a 30-pack-year history of smoking and currently smoke or have quit within the past 15 years.  Colorectal cancer screening. All adults should have this screening starting at age 50 and continuing until age 75. Your health care provider may recommend screening at age 45 if you are at increased risk. You will have tests every 1-10 years, depending on your results and the type of screening test.  Diabetes   screening. This is done by checking your blood sugar (glucose) after you have not eaten for a while (fasting). You may have this done every 1-3 years.  Mammogram. This may be done every 1-2 years. Talk with your health care provider about when you should start having regular mammograms. This may depend on whether you have a family history of  breast cancer.  BRCA-related cancer screening. This may be done if you have a family history of breast, ovarian, tubal, or peritoneal cancers.  Pelvic exam and Pap test. This may be done every 3 years starting at age 21. Starting at age 30, this may be done every 5 years if you have a Pap test in combination with an HPV test. Other tests  Sexually transmitted disease (STD) testing.  Bone density scan. This is done to screen for osteoporosis. You may have this scan if you are at high risk for osteoporosis. Follow these instructions at home: Eating and drinking  Eat a diet that includes fresh fruits and vegetables, whole grains, lean protein, and low-fat dairy.  Take vitamin and mineral supplements as recommended by your health care provider.  Do not drink alcohol if: ? Your health care provider tells you not to drink. ? You are pregnant, may be pregnant, or are planning to become pregnant.  If you drink alcohol: ? Limit how much you have to 0-1 drink a day. ? Be aware of how much alcohol is in your drink. In the [place] one drink equals one 12 oz bottle of beer (355 mL), one 5 oz glass of wine (148 mL), or one 1 oz glass of hard liquor (44 mL). Lifestyle  Take daily care of your teeth and gums.  Stay active. Exercise for at least 30 minutes on 5 or more days each week.  Do not use any products that contain nicotine or tobacco, such as cigarettes, e-cigarettes, and chewing tobacco. If you need help quitting, ask your health care provider.  If you are sexually active, practice safe sex. Use a condom or other form of birth control (contraception) in order to prevent pregnancy and STIs (sexually transmitted infections).  If told by your health care provider, take low-dose aspirin daily starting at age 50. What's next?  Visit your health care provider once a year for a well check visit.  Ask your health care provider how often you should have your eyes and teeth checked.  Stay up to  date on all vaccines. This information is not intended to replace advice given to you by your health care provider. Make sure you discuss any questions you have with your health care provider. Document Revised: 02/15/2018 Document Reviewed: 02/15/2018 Elsevier Patient Education  2020 Elsevier Inc.  

## 2020-03-19 NOTE — Assessment & Plan Note (Signed)
Chronic, stable on hydrochlorothiazide 25 mg and lisinopril 40 mg.  Blood pressure at goal today in office.  Encourage patient to check blood pressure at home to be sure is maintaining below goal of 130/80.  Continue current medications for now, refill sent in.  CMP, CBC, TSH checked today.  Follow-up in 3 months.

## 2020-03-19 NOTE — Assessment & Plan Note (Addendum)
Chronic, ongoing.  Last A1c in March 11.4%.  Currently taking Metformin 1000 mg twice daily and glipizide 5 mg twice daily.  Continue these medications for now-May need to adjust after A1c returns.  Foot exam done today, eye exam up-to-date.  CMP checked today.  Follow-up in 3 months or sooner if needs arise.

## 2020-03-19 NOTE — Addendum Note (Signed)
Addended by: Cathlean Marseilles A on: 03/19/2020 05:10 PM   Modules accepted: Orders

## 2020-03-19 NOTE — Assessment & Plan Note (Signed)
Chronic, ongoing.  Unclear etiology, is following with GI.  Appointment next month.  Continue collaboration with GI.

## 2020-03-19 NOTE — Assessment & Plan Note (Signed)
Discussed dietary lifestyle changes.  With goal of 30 minutes of exercise 5 times daily.

## 2020-03-19 NOTE — Assessment & Plan Note (Signed)
Chronic, stable.  Was evaluated with cardiologist including Holter monitor and echocardiogram.  Reports palpitations are not getting worse.  Due for follow-up with cardiology-recommended scheduling an appointment ASAP.

## 2020-03-20 ENCOUNTER — Other Ambulatory Visit: Payer: Self-pay | Admitting: Nurse Practitioner

## 2020-03-20 ENCOUNTER — Encounter: Payer: Self-pay | Admitting: Nurse Practitioner

## 2020-03-20 LAB — COMPREHENSIVE METABOLIC PANEL
ALT: 16 IU/L (ref 0–32)
AST: 14 IU/L (ref 0–40)
Albumin/Globulin Ratio: 1.2 (ref 1.2–2.2)
Albumin: 3.9 g/dL (ref 3.8–4.9)
Alkaline Phosphatase: 72 IU/L (ref 44–121)
BUN/Creatinine Ratio: 16 (ref 9–23)
BUN: 16 mg/dL (ref 6–24)
Bilirubin Total: 0.3 mg/dL (ref 0.0–1.2)
CO2: 25 mmol/L (ref 20–29)
Calcium: 9.6 mg/dL (ref 8.7–10.2)
Chloride: 100 mmol/L (ref 96–106)
Creatinine, Ser: 0.98 mg/dL (ref 0.57–1.00)
GFR calc Af Amer: 77 mL/min/{1.73_m2} (ref >59)
GFR calc non Af Amer: 67 mL/min/{1.73_m2} (ref >59)
Globulin, Total: 3.3 g/dL (ref 1.5–4.5)
Glucose: 204 mg/dL — ABNORMAL HIGH (ref 65–99)
Potassium: 4.5 mmol/L (ref 3.5–5.2)
Sodium: 139 mmol/L (ref 134–144)
Total Protein: 7.2 g/dL (ref 6.0–8.5)

## 2020-03-20 LAB — CBC WITH DIFFERENTIAL/PLATELET
Basophils Absolute: 0 10*3/uL (ref 0.0–0.2)
Basos: 0 %
EOS (ABSOLUTE): 0.1 10*3/uL (ref 0.0–0.4)
Eos: 2 %
Hematocrit: 36.9 % (ref 34.0–46.6)
Hemoglobin: 11.7 g/dL (ref 11.1–15.9)
Immature Grans (Abs): 0 10*3/uL (ref 0.0–0.1)
Immature Granulocytes: 0 %
Lymphocytes Absolute: 3.4 10*3/uL — ABNORMAL HIGH (ref 0.7–3.1)
Lymphs: 40 %
MCH: 25.8 pg — ABNORMAL LOW (ref 26.6–33.0)
MCHC: 31.7 g/dL (ref 31.5–35.7)
MCV: 82 fL (ref 79–97)
Monocytes Absolute: 0.6 10*3/uL (ref 0.1–0.9)
Monocytes: 7 %
Neutrophils Absolute: 4.3 10*3/uL (ref 1.4–7.0)
Neutrophils: 51 %
Platelets: 327 10*3/uL (ref 150–450)
RBC: 4.53 x10E6/uL (ref 3.77–5.28)
RDW: 13.5 % (ref 11.7–15.4)
WBC: 8.4 10*3/uL (ref 3.4–10.8)

## 2020-03-20 LAB — LIPID PANEL W/O CHOL/HDL RATIO
Cholesterol, Total: 180 mg/dL (ref 100–199)
HDL: 50 mg/dL (ref >39)
LDL Chol Calc (NIH): 109 mg/dL — ABNORMAL HIGH (ref 0–99)
Triglycerides: 116 mg/dL (ref 0–149)
VLDL Cholesterol Cal: 21 mg/dL (ref 5–40)

## 2020-03-20 LAB — HEMOGLOBIN A1C
Est. average glucose Bld gHb Est-mCnc: 214 mg/dL
Hgb A1c MFr Bld: 9.1 % — ABNORMAL HIGH (ref 4.8–5.6)

## 2020-03-20 LAB — TSH: TSH: 0.909 u[IU]/mL (ref 0.450–4.500)

## 2020-03-20 MED ORDER — GLIPIZIDE 10 MG PO TABS: 10.0000 mg | ORAL_TABLET | Freq: Two times a day (BID) | ORAL | 0 refills | Status: DC

## 2020-03-20 NOTE — Progress Notes (Signed)
Updated rx sent in.

## 2020-03-20 NOTE — Addendum Note (Signed)
Addended by: Cathlean Marseilles A on: 03/20/2020 04:11 PM   Modules accepted: Orders

## 2020-03-25 ENCOUNTER — Other Ambulatory Visit: Payer: Self-pay

## 2020-03-25 ENCOUNTER — Encounter: Payer: Self-pay | Admitting: Podiatry

## 2020-03-25 ENCOUNTER — Ambulatory Visit: Payer: Managed Care, Other (non HMO) | Admitting: Podiatry

## 2020-03-25 DIAGNOSIS — M7752 Other enthesopathy of left foot: Secondary | ICD-10-CM | POA: Diagnosis not present

## 2020-03-25 DIAGNOSIS — M7672 Peroneal tendinitis, left leg: Secondary | ICD-10-CM

## 2020-03-25 NOTE — Progress Notes (Signed)
She presents today after having not seen her for more than a year with a chief complaint of pain to the same spot as she points to the sinus tarsi in the back of her foot.  States that she still has not gotten her orthotics from when they were being modified last year.  Objective: Vital signs are stable she alert and oriented x3.  Pulses are palpable left.  Pes planus is noted left.  She still has pain on dorsiflexion of the hallux and the posterior aspect of the ankle.  And she has pain on palpation of the sinus tarsi left foot.  She also has pain on end range of motion of the subtalar joint.  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  Also subtalar joint osteoarthritis and capsulitis.  Plan: Discussed etiology pathology conservative versus surgical therapies at this point time injected posteriorly to the os trigonum with 10 mg of Kenalog 20 mg of Kenalog was placed into the sinus tarsi.  She tolerated procedure well I will follow-up with her once her orthotics come in.

## 2020-04-22 ENCOUNTER — Other Ambulatory Visit: Payer: Managed Care, Other (non HMO) | Admitting: Orthotics

## 2020-05-07 ENCOUNTER — Encounter: Payer: Self-pay | Admitting: Gastroenterology

## 2020-05-07 ENCOUNTER — Ambulatory Visit (INDEPENDENT_AMBULATORY_CARE_PROVIDER_SITE_OTHER): Payer: Managed Care, Other (non HMO) | Admitting: Gastroenterology

## 2020-05-07 ENCOUNTER — Other Ambulatory Visit: Payer: Self-pay

## 2020-05-07 VITALS — BP 121/78 | HR 94 | Ht 64.5 in | Wt 316.6 lb

## 2020-05-07 DIAGNOSIS — K580 Irritable bowel syndrome with diarrhea: Secondary | ICD-10-CM | POA: Diagnosis not present

## 2020-05-07 NOTE — Progress Notes (Signed)
Wyline Mood MD, MRCP(U.K) [address]  [address]  [place], [place] [address]  Main: [phone]  Fax: [phone]   Primary Care Physician: Particia Nearing, [place]  Primary Gastroenterologist:  Dr. Wyline Mood   Follow-up for diarrhea  HPI: Yesenia Stevens is a 54 y.o. female    Summary of history :  Initially referred and seen in June 2021 for diarrhea.  It had been going on for more than a year.  Over 4 bowel movements per day.  Nonbloody.  Watery.  Associated with bloating and gas.  Status post cholecystectomy.  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  Interval history 11/28/2019-05/07/2020  12/09/2019: Stool for C. difficile, fecal calprotectin and GI PCR were normal.  12/24/2019 colonoscopy: Normal-appearing colon except diverticulosis noted.  Biopsies taken of the entire colon.  Which showed no abnormalities.  Continues to have softer stools sloppy nature a lot of gas bloating abdominal distention flocculence.  Denies any use of artificial sugars or sweeteners.  Did not tolerate Questran made her feel sick.  Current Outpatient Medications  Medication Sig Dispense Refill  . albuterol (PROVENTIL HFA;VENTOLIN HFA) 108 (90 Base) MCG/ACT inhaler Inhale 1-2 puffs into the lungs every 6 (six) hours as needed for wheezing or shortness of breath. 1 Inhaler 0  . glipiZIDE (GLUCOTROL) 10 MG tablet Take 1 tablet (10 mg total) by mouth 2 (two) times daily before a meal. 180 tablet 0  . hydrochlorothiazide (HYDRODIURIL) 25 MG tablet Take 1 tablet (25 mg total) by mouth daily. 90 tablet 0  . lisinopril (ZESTRIL) 40 MG tablet Take 1 tablet by mouth once daily 90 tablet 1  . metFORMIN (GLUCOPHAGE) 1000 MG tablet Take 1 tablet (1,000 mg total) by mouth 2 (two) times daily with a meal. 180 tablet 0  . cholestyramine light (PREVALITE) 4 g packet Take 1 packet (4 g total) by mouth daily. 30 packet 2  . diclofenac sodium (VOLTAREN) 1 % GEL Apply 2 g topically 4  (four) times daily. (Patient not taking: Reported on 05/07/2020) 100 g 3   No current facility-administered medications for this visit.    Allergies as of 05/07/2020  . (No Known Allergies)    ROS:  General: Negative for anorexia, weight loss, fever, chills, fatigue, weakness. ENT: Negative for hoarseness, difficulty swallowing , nasal congestion. CV: Negative for chest pain, angina, palpitations, dyspnea on exertion, peripheral edema.  Respiratory: Negative for dyspnea at rest, dyspnea on exertion, cough, sputum, wheezing.  GI: See history of present illness. GU:  Negative for dysuria, hematuria, urinary incontinence, urinary frequency, nocturnal urination.  Endo: Negative for unusual weight change.    Physical Examination:   BP 121/78 (BP Location: Left Arm, Patient Position: Sitting, Cuff Size: Large)   Pulse 94   Ht 5' 4.5" (1.638 m)   Wt (!) 316 lb 9.6 oz (143.6 kg)   LMP 08/20/2015 (Approximate) Comment: denies preg  BMI 53.50 kg/m   General: Well-nourished, well-developed in no acute distress.  Eyes: No icterus. Conjunctivae pink. Extremities: No lower extremity edema. No clubbing or deformities. Neuro: Alert and oriented x 3.  Grossly intact. Skin: Warm and dry, no jaundice.   Psych: Alert and cooperative, normal mood and affect.   Imaging Studies: No results found.  Assessment and Plan:   Yesenia Stevens is a 54 y.o. y/o female here to follow-up for diarrhea for over a year not responded to holding Metformin.  Nonbloody.  Status post cholecystectomy.  Differentials include IBS diarrhea versus bile salt  diarrhea versus colitis  Plan 1.    Did not tolerate Questran.  Trial of Xifaxan for IBS diarrhea. 2.  Low FODMAP diet.  If fails will try Imodium.    Dr Wyline Mood  MD,MRCP [facility]) Follow up in 4 weeks

## 2020-05-08 MED ORDER — RIFAXIMIN 550 MG PO TABS
550.0000 mg | ORAL_TABLET | Freq: Three times a day (TID) | ORAL | 0 refills | Status: AC
Start: 2020-05-08 — End: 2020-05-22

## 2020-05-20 ENCOUNTER — Other Ambulatory Visit: Payer: Self-pay

## 2020-05-20 ENCOUNTER — Other Ambulatory Visit: Payer: Managed Care, Other (non HMO) | Admitting: Orthotics

## 2020-05-20 DIAGNOSIS — M216X2 Other acquired deformities of left foot: Secondary | ICD-10-CM | POA: Diagnosis not present

## 2020-05-20 DIAGNOSIS — M216X1 Other acquired deformities of right foot: Secondary | ICD-10-CM | POA: Diagnosis not present

## 2020-05-22 ENCOUNTER — Encounter: Payer: Self-pay | Admitting: Nurse Practitioner

## 2020-06-10 ENCOUNTER — Encounter: Payer: Managed Care, Other (non HMO) | Admitting: Orthotics

## 2020-06-18 ENCOUNTER — Ambulatory Visit: Payer: Managed Care, Other (non HMO) | Admitting: Nurse Practitioner

## 2020-06-25 ENCOUNTER — Ambulatory Visit (INDEPENDENT_AMBULATORY_CARE_PROVIDER_SITE_OTHER): Payer: Managed Care, Other (non HMO) | Admitting: Family Medicine

## 2020-06-25 ENCOUNTER — Encounter: Payer: Self-pay | Admitting: Family Medicine

## 2020-06-25 ENCOUNTER — Other Ambulatory Visit: Payer: Self-pay

## 2020-06-25 ENCOUNTER — Ambulatory Visit: Payer: Managed Care, Other (non HMO) | Admitting: Orthotics

## 2020-06-25 VITALS — BP 135/84 | HR 88 | Temp 98.6°F | Wt 315.6 lb

## 2020-06-25 DIAGNOSIS — E1142 Type 2 diabetes mellitus with diabetic polyneuropathy: Secondary | ICD-10-CM

## 2020-06-25 DIAGNOSIS — R5383 Other fatigue: Secondary | ICD-10-CM

## 2020-06-25 DIAGNOSIS — E119 Type 2 diabetes mellitus without complications: Secondary | ICD-10-CM | POA: Diagnosis not present

## 2020-06-25 DIAGNOSIS — I10 Essential (primary) hypertension: Secondary | ICD-10-CM | POA: Diagnosis not present

## 2020-06-25 DIAGNOSIS — R1013 Epigastric pain: Secondary | ICD-10-CM | POA: Diagnosis not present

## 2020-06-25 DIAGNOSIS — M7672 Peroneal tendinitis, left leg: Secondary | ICD-10-CM

## 2020-06-25 DIAGNOSIS — M7752 Other enthesopathy of left foot: Secondary | ICD-10-CM

## 2020-06-25 DIAGNOSIS — R197 Diarrhea, unspecified: Secondary | ICD-10-CM

## 2020-06-25 LAB — MICROALBUMIN, URINE WAIVED
Creatinine, Urine Waived: 200 mg/dL (ref 10–300)
Microalb, Ur Waived: 30 mg/L — ABNORMAL HIGH (ref 0–19)
Microalb/Creat Ratio: <30 mg/g (ref <30)

## 2020-06-25 LAB — BAYER DCA HB A1C WAIVED: HB A1C (BAYER DCA - WAIVED): 8.7 % — ABNORMAL HIGH (ref <7.0)

## 2020-06-25 MED ORDER — LISINOPRIL 40 MG PO TABS: 40.0000 mg | ORAL_TABLET | Freq: Every day | ORAL | 2 refills | Status: DC

## 2020-06-25 MED ORDER — METFORMIN HCL 1000 MG PO TABS: 1000.0000 mg | ORAL_TABLET | Freq: Two times a day (BID) | ORAL | 2 refills | Status: DC

## 2020-06-25 MED ORDER — EMPAGLIFLOZIN 10 MG PO TABS
10.0000 mg | ORAL_TABLET | Freq: Every day | ORAL | 0 refills | Status: DC
Start: 2020-06-25 — End: 2020-08-31

## 2020-06-25 MED ORDER — GLIPIZIDE 10 MG PO TABS: 10.0000 mg | ORAL_TABLET | Freq: Two times a day (BID) | ORAL | 1 refills | Status: DC

## 2020-06-25 MED ORDER — HYDROCHLOROTHIAZIDE 25 MG PO TABS: 25.0000 mg | ORAL_TABLET | Freq: Every day | ORAL | 2 refills | Status: DC

## 2020-06-25 NOTE — Assessment & Plan Note (Signed)
Improved but still not at goal, A1c 8.7% today. Will start jardiance, referral sent to pharmacy to help in financial assistance. Counseled on weight loss. Could also consider semaglutide in the future to aid in diabetic control as well as weight loss if unable to afford jardiance. Aim to eventually discontinue glipizide given lack of morbidity benefit and risk of hypoglycemia with aging. F/u 1 month after starting jardiance.

## 2020-06-25 NOTE — Assessment & Plan Note (Signed)
With abdominal discomfort, previously evaluated by GI. Likely 2/2 post-cholecystectomy given symptoms, normal findings on exam, prior workup, and chronic duration. Recommend trying cholestyramine. Will obtain labs today. F/u if no better.

## 2020-06-25 NOTE — Patient Instructions (Signed)
It was great to see you!  Our plans for today:  - Try the cholestyramine packet for you abdominal pain.  - We will try to get the jardiance affordably for you. - Keep working to try to lose weight.  - We sent refills of your meds.    We are checking some labs today, we will release these results to your MyChart.  Take care and seek immediate care sooner if you develop any concerns.   Dr. Linwood Dibbles

## 2020-06-25 NOTE — Assessment & Plan Note (Signed)
At goal for age. No changes to meds. Obtain labs today. F/u in 3 months.

## 2020-06-25 NOTE — Progress Notes (Signed)
Patient picked up f/o and was pleased with fit, comfort, and function.  Worked well with footwear.  Told of rbeak in period and how to report any issues.  

## 2020-06-25 NOTE — Progress Notes (Signed)
   SUBJECTIVE:   CHIEF COMPLAINT / HPI:   Patient Active Problem List   Diagnosis Date Noted  . Palpitations 03/19/2020  . Diarrhea 03/19/2020  . Premature atrial contractions 08/20/2018  . Morbid (severe) obesity due to excess calories (HCC) 08/01/2018  . Shortness of breath 07/26/2018  . Chronic cough 03/22/2018  . Diabetes mellitus without complication (HCC) 03/22/2018  . Hypertension 03/22/2018   Hypertension: - Medications: HCTZ 25mg  daily, lisinopril 40mg  daily - Compliance: good - Checking BP at home: no - Denies any SOB, CP, vision changes, LE edema, medication SEs, or symptoms of hypotension - Diet: see below - Exercise: see below  Diabetes, Type 2 - Last A1c 9.1 02/2020 - Medications: metformin 1000mg  BID, glipizide 10mg  BID - Compliance: good - Checking BG at home: no - Diet: eats 3 meals per day, smaller portion. Eats fruits/vegetables. - Exercise: none - Eye exam: due - Foot exam: UTD - Statin: no - Denies symptoms of hypoglycemia, polyuria, polydipsia, numbness extremities, foot ulcers/trauma  Abdominal discomfort - belching, general abd discomfort after eating - previously tried cholestyramine but hasn't taken in a while cause she doesn't want to take a lot of meds. - prior cholecystectomy - colonoscopy 12/2019 with diverticulosis, otherwise normal.  - no blood in stool - with diarrhea  OBJECTIVE:   BP 135/84   Pulse 88   Temp 98.6 F (37 C) (Oral)   Wt (!) 315 lb 9.6 oz (143.2 kg)   LMP 08/20/2015 (Approximate) Comment: denies preg  SpO2 98%   BMI 53.34 kg/m   Gen: obese, in NAD Card: RRR Lungs: CTAB Abd: soft, TTP slightly in epigastric region, +BS. No organomegaly appreciated. No rebound or guarding. Ext: WWP, no edema  ASSESSMENT/PLAN:   Hypertension At goal for age. No changes to meds. Obtain labs today. F/u in 3 months.  Diabetes mellitus without complication (HCC) Improved but still not at goal, A1c 8.7% today. Will start  jardiance, referral sent to pharmacy to help in financial assistance. Counseled on weight loss. Could also consider semaglutide in the future to aid in diabetic control as well as weight loss if unable to afford jardiance. Aim to eventually discontinue glipizide given lack of morbidity benefit and risk of hypoglycemia with aging. F/u 1 month after starting jardiance.  Diarrhea With abdominal discomfort, previously evaluated by GI. Likely 2/2 post-cholecystectomy given symptoms, normal findings on exam, prior workup, and chronic duration. Recommend trying cholestyramine. Will obtain labs today. F/u if no better.    , DO

## 2020-06-26 ENCOUNTER — Telehealth: Payer: Self-pay

## 2020-06-26 LAB — LDL CHOLESTEROL, DIRECT: LDL Direct: 108 mg/dL — ABNORMAL HIGH (ref 0–99)

## 2020-06-26 LAB — COMPREHENSIVE METABOLIC PANEL
ALT: 14 IU/L (ref 0–32)
AST: 14 IU/L (ref 0–40)
Albumin/Globulin Ratio: 1.3 (ref 1.2–2.2)
Albumin: 3.8 g/dL (ref 3.8–4.9)
Alkaline Phosphatase: 63 IU/L (ref 44–121)
BUN/Creatinine Ratio: 15 (ref 9–23)
BUN: 11 mg/dL (ref 6–24)
Bilirubin Total: 0.3 mg/dL (ref 0.0–1.2)
CO2: 25 mmol/L (ref 20–29)
Calcium: 9.2 mg/dL (ref 8.7–10.2)
Chloride: 103 mmol/L (ref 96–106)
Creatinine, Ser: 0.71 mg/dL (ref 0.57–1.00)
GFR calc Af Amer: 113 mL/min/{1.73_m2} (ref >59)
GFR calc non Af Amer: 98 mL/min/{1.73_m2} (ref >59)
Globulin, Total: 2.9 g/dL (ref 1.5–4.5)
Glucose: 197 mg/dL — ABNORMAL HIGH (ref 65–99)
Potassium: 4.3 mmol/L (ref 3.5–5.2)
Sodium: 141 mmol/L (ref 134–144)
Total Protein: 6.7 g/dL (ref 6.0–8.5)

## 2020-06-26 LAB — CBC
Hematocrit: 35.9 % (ref 34.0–46.6)
Hemoglobin: 11.7 g/dL (ref 11.1–15.9)
MCH: 26.6 pg (ref 26.6–33.0)
MCHC: 32.6 g/dL (ref 31.5–35.7)
MCV: 82 fL (ref 79–97)
Platelets: 333 10*3/uL (ref 150–450)
RBC: 4.4 x10E6/uL (ref 3.77–5.28)
RDW: 13.1 % (ref 11.7–15.4)
WBC: 6.6 10*3/uL (ref 3.4–10.8)

## 2020-06-26 NOTE — Chronic Care Management (AMB) (Signed)
  Care Management   Note  06/26/2020 Name: Gracelynne Benedict MRN: [id number] DOB: 09/01/1965  Yesenia Stevens is a 55 y.o. year old female who is a primary care patient of Particia Nearing, [place]. I reached out to [name] by phone today in response to a referral sent by Ms. Delana Meyer PCP Dr. Linwood Dibbles.  Ms. Bilyeu was given information about care management services today including:  1. Care management services include personalized support from designated clinical staff supervised by her physician, including individualized plan of care and coordination with other care providers 2. 24/7 contact phone numbers for assistance for urgent and routine care needs. 3. The patient may stop care management services at any time by phone call to the office staff.  Patient agreed to services and verbal consent obtained.   Follow up plan: Telephone appointment with care management team member scheduled for: Pharm D 07/10/2020  [name], RMA Care Guide, Embedded Care Coordination [facility]  [place], [place] [address] Direct Dial: [phone] [email or web address]: [email or web address]

## 2020-06-30 ENCOUNTER — Ambulatory Visit: Payer: Self-pay | Admitting: *Deleted

## 2020-06-30 NOTE — Telephone Encounter (Signed)
Pt called in c/o having a dizzy spell last night and has left her with a headache since the dizzy spell.  She's never had this happen before.    No dizziness this morning just a headache all over her head. Denies recent sickness other than 2 wks ago she was having post nasal drip with a sore throat which is almost gone..   She mentioned she still has a little post nasal drainage.   Denies N/V D.  No new medications.    She does not have a BP machine to check her BP When I offered to make her an appt she asked me to wait because she would rather go to [facility] and check her BP there than pay for an office visit in case her BP was fine.  I asked her to please call us back if she has elevated BP after checking it or is not feeling better. I instructed her to call 911 if she developed slurred speech, weakness or numbness on one side of her body or face, drooping of one side of her face, visual changes or the headache becomes more severe.   She verbalized understanding and was agreeable to calling us back after checking her BP at [facility] if it was elevated.  I forwarded my notes to [facility]. She last saw Dr. Ellwood Dense. Reason for Disposition . [1] MODERATE dizziness (e.g., interferes with normal activities) AND [2] has NOT been evaluated by physician for this  (Exception: dizziness caused by heat exposure, sudden standing, or poor fluid intake)  Answer Assessment - Initial Assessment Questions 1. DESCRIPTION: "Describe your dizziness."     I'm having dizziness and a headache. 2. LIGHTHEADED: "Do you feel lightheaded?" (e.g., somewhat faint, woozy, weak upon standing)     No 3. VERTIGO: "Do you feel like either you or the room is spinning or tilting?" (i.e. vertigo)     Yes the room is spinning.   4. SEVERITY: "How bad is it?"  "Do you feel like you are going to faint?" "Can you stand and walk?"   - MILD: Feels slightly dizzy, but walking normally.   - MODERATE: Feels very  unsteady when walking, but not falling; interferes with normal activities (e.g., school, work) .   - SEVERE: Unable to walk without falling, or requires assistance to walk without falling; feels like passing out now.      I have a headache all over.    It's like a spell and it passed and now I have a headache. 5. ONSET:  "When did the dizziness begin?"     Last night I was dizzy for the first time.   No illness, no V/D.    I'm on medication for high BP.   I ate something spicy last night so I thought maybe it was my BP going up.   The dizziness was last night but the headache has persisted.   When I move my head it makes it worse.    I had the post nasal drainage 2 wks ago.   It's not so bad now.   I'm still blowing my nose.    Had a sore throat 2 wks ago. 6. AGGRAVATING FACTORS: "Does anything make it worse?" (e.g., standing, change in head position)     When I move my head it just feels like a low grade throbbing.   7. HEART RATE: "Can you tell me your heart rate?" "How many beats in 15 seconds?"  (Note: not  all patients can do this)       Not asked. 8. CAUSE: "What do you think is causing the dizziness?"     See above.  Spicy food making my BP go up maybe. 9. RECURRENT SYMPTOM: "Have you had dizziness before?" If Yes, ask: "When was the last time?" "What happened that time?"     No She wants her BP checked. 10. OTHER SYMPTOMS: "Do you have any other symptoms?" (e.g., fever, chest pain, vomiting, diarrhea, bleeding)       *No Answer* 11. PREGNANCY: "Is there any chance you are pregnant?" "When was your last menstrual period?"       *No Answer*  Protocols used: DIZZINESS [employer]

## 2020-07-02 ENCOUNTER — Ambulatory Visit: Payer: Managed Care, Other (non HMO) | Admitting: Gastroenterology

## 2020-07-08 ENCOUNTER — Encounter: Payer: Managed Care, Other (non HMO) | Admitting: Orthotics

## 2020-07-09 NOTE — Progress Notes (Signed)
Chronic Care Management   Note  07/10/2020 Name: Yesenia Stevens MRN: [id number] DOB: 10-Jan-1966  Reason for referral: Jardiance assistance  Referral source: Shela Commons, DO Referral medication(s): Jardiance Current insurance:[employer]  PMHx: HTN, Daibetes   HPI: A1c not at goal (8.7%)on metformin and glipizide   Objective: No Known Allergies  Medications Reviewed Today    Reviewed by Vladimir Faster, [facility] (Pharmacist) on 07/10/20 at [facility] List Status: <None>  Medication Order Taking? Sig Documenting Provider Last Dose Status Informant  albuterol (PROVENTIL HFA;VENTOLIN HFA) 108 (90 Base) MCG/ACT inhaler [id number]  Inhale 1-2 puffs into the lungs every 6 (six) hours as needed for wheezing or shortness of breath. Hagler, Judithe Modest, PA-C  Active            Med Note Di Kindle, ANITA   Thu Mar 22, 2018  8:59 AM) PRN  cholestyramine light (PREVALITE) 4 g packet [id number]  Take 1 packet (4 g total) by mouth daily. [name], MD  Expired 02/26/20 2359   diclofenac sodium (VOLTAREN) 1 % GEL [id number]  Apply 2 g topically 4 (four) times daily. Volney American, PA-C  Active            Med Note Di Kindle, ANITA   Wed Aug 21, 2019  2:54 PM) As needed  empagliflozin (JARDIANCE) 10 MG TABS tablet [id number]  Take 1 tablet (10 mg total) by mouth daily before breakfast. Myles Gip, DO  Active   glipiZIDE (GLUCOTROL) 10 MG tablet [id number]  Take 1 tablet (10 mg total) by mouth 2 (two) times daily before a meal. Myles Gip, DO  Active   hydrochlorothiazide (HYDRODIURIL) 25 MG tablet [id number] Yes Take 1 tablet (25 mg total) by mouth daily. Myles Gip, DO Taking Active   lisinopril (ZESTRIL) 40 MG tablet [id number] Yes Take 1 tablet (40 mg total) by mouth daily. Myles Gip, DO Taking Active   metFORMIN (GLUCOPHAGE) 1000 MG tablet [id number] Yes Take 1 tablet (1,000 mg total) by mouth 2 (two) times daily with a meal. Myles Gip, DO Taking Active            Assessment:  Patient with uncontrolled type 2 DM. A1c 8.7% . Patient does not have a BG or BP monitor at home.   Medication Review Findings:  . Gipizide 10 mg bid  Medication Assistance Findings:  Extra Help:   []  Already receiving Full Extra Help  []  Already receiving Partial Extra Help  []  Eligible based on reported income and assets  [x]  Not Eligible based on reported income and assets  Patient Assistance Programs: 1) Jardiance made by [employer] o Income requirement met: []  Yes []  No [x]  Unknown o Out-of-pocket prescription expenditure met:    []  Yes []  No  []  Unknown  [x]  Not applicable - Patient has Pharmacist, community. Savings card for $10/month copay completed online and emailed to patient. Will also initiate patient assistance.        2) Needs assistance in obtaining BP cuff and BG monitor. Previously told not covered by insurance and her cost would be $80. I have reached out to the care guide team for assistance with this.   Additional medication assistance options reviewed with patient as warranted:  Tier Exception request  Goals Addressed            This Visit's Progress   . Pharmacy Care Plan       CARE PLAN ENTRY (see longitudinal plan of care for additional care plan  information)  Current Barriers:  . Financial Barriers in complicated patient with multiple medical conditions including diabetes, hypertension and morbid obesity. Patient has [employer] and reports copay for Yesenia Stevens is cost prohibitive at this time . A1c above goal at 8.7% last visit.  . Patient does not have a Glucometer or BP cuff at home. Not covered by insurance and unable to afford.   Pharmacist Clinical Goal(s):  Marland Kitchen Over the next 30 days, patient will work with PharmD and providers to relieve medication access concerns  Interventions: . Comprehensive medication review completed; medication list updated in electronic medical record.  Yesenia Stevens by BI: Patient  enrolled in copay savings card today. Emailed to patient. Will also apply for  BI'smedication's patient assistance program. Reviewed application process. Patient will provide proof of income, out of pocket spend report, and will sign application. Will collaborate with primary care provider . Reached out to care guide team via staff messaging for assistance in obtaining monitoring devices. (unable to send referrals in Epic)   Patient Self Care Activities:  . Patient will provide necessary portions of application   Initial goal documentation         Plan: Patient is to provide additional documents as necessary. Patient given my direct contact line and advised to call with any questions or concerns.  Follow up:  2 weeks  Junita Push. Kenton Kingfisher PharmD, [facility] [phone]

## 2020-07-10 ENCOUNTER — Ambulatory Visit: Payer: Managed Care, Other (non HMO) | Admitting: Pharmacist

## 2020-07-10 DIAGNOSIS — E119 Type 2 diabetes mellitus without complications: Secondary | ICD-10-CM

## 2020-07-10 NOTE — Patient Instructions (Addendum)
Visit Information  It was a pleasure speaking with you today. Thank you for letting me be part of your clinical team. Please call with any questions or concerns.   Goals Addressed            This Visit's Progress   . Pharmacy Care Plan       CARE PLAN ENTRY (see longitudinal plan of care for additional care plan information)  Current Barriers:  . Financial Barriers in complicated patient with multiple medical conditions including diabetes, hypertension and morbid obesity. Patient has [employer] and reports copay for [place] Pepper is cost prohibitive at this time . A1c above goal at 8.7% last visit.  . Patient does not have a Glucometer or BP cuff at home. Not covered by insurance and unable to afford.   Pharmacist Clinical Goal(s):  Marland Kitchen Over the next 30 days, patient will work with PharmD and providers to relieve medication access concerns  Interventions: . Comprehensive medication review completed; medication list updated in electronic medical record.  London Pepper by BI: Patient enrolled in copay savings card today. Emailed to patient. Will also apply for  BI'smedication's patient assistance program. Reviewed application process. Patient will provide proof of income, out of pocket spend report, and will sign application. Will collaborate with primary care provider . Reached out to care guide team via staff messaging for assistance in obtaining monitoring devices. (unable to send referrals in Epic)   Patient Self Care Activities:  . Patient will provide necessary portions of application   Initial goal documentation        The patient verbalized understanding of instructions, educational materials, and care plan provided today and agreed to receive a mailed copy of patient instructions, educational materials, and care plan.   Telephone follow up appointment with pharmacy team member scheduled for: 2-3 montsh  Mercer Pod. Rhyse Loux PharmD, BCPS Clinical  Pharmacist [phone]  Diabetes Mellitus and Nutrition, Adult When you have diabetes, or diabetes mellitus, it is very important to have healthy eating habits because your blood sugar (glucose) levels are greatly affected by what you eat and drink. Eating healthy foods in the right amounts, at about the same times every day, can help you:  Control your blood glucose.  Lower your risk of heart disease.  Improve your blood pressure.  Reach or maintain a healthy weight. What can affect my meal plan? Every person with diabetes is different, and each person has different needs for a meal plan. Your health care provider may recommend that you work with a dietitian to make a meal plan that is best for you. Your meal plan may vary depending on factors such as:  The calories you need.  The medicines you take.  Your weight.  Your blood glucose, blood pressure, and cholesterol levels.  Your activity level.  Other health conditions you have, such as heart or kidney disease. How do carbohydrates affect me? Carbohydrates, also called carbs, affect your blood glucose level more than any other type of food. Eating carbs naturally raises the amount of glucose in your blood. Carb counting is a method for keeping track of how many carbs you eat. Counting carbs is important to keep your blood glucose at a healthy level, especially if you use insulin or take certain oral diabetes medicines. It is important to know how many carbs you can safely have in each meal. This is different for every person. Your dietitian can help you calculate how many carbs you should have at each meal and  for each snack. How does alcohol affect me? Alcohol can cause a sudden decrease in blood glucose (hypoglycemia), especially if you use insulin or take certain oral diabetes medicines. Hypoglycemia can be a life-threatening condition. Symptoms of hypoglycemia, such as sleepiness, dizziness, and confusion, are similar to  symptoms of having too much alcohol.  Do not drink alcohol if: ? Your health care provider tells you not to drink. ? You are pregnant, may be pregnant, or are planning to become pregnant.  If you drink alcohol: ? Do not drink on an empty stomach. ? Limit how much you use to:  0-1 drink a day for women.  0-2 drinks a day for men. ? Be aware of how much alcohol is in your drink. In the [place] one drink equals one 12 oz bottle of beer (355 mL), one 5 oz glass of wine (148 mL), or one 1 oz glass of hard liquor (44 mL). ? Keep yourself hydrated with water, diet soda, or unsweetened iced tea.  Keep in mind that regular soda, juice, and other mixers may contain a lot of sugar and must be counted as carbs. What are tips for following this plan? Reading food labels  Start by checking the serving size on the "Nutrition Facts" label of packaged foods and drinks. The amount of calories, carbs, fats, and other nutrients listed on the label is based on one serving of the item. Many items contain more than one serving per package.  Check the total grams (g) of carbs in one serving. You can calculate the number of servings of carbs in one serving by dividing the total carbs by 15. For example, if a food has 30 g of total carbs per serving, it would be equal to 2 servings of carbs.  Check the number of grams (g) of saturated fats and trans fats in one serving. Choose foods that have a low amount or none of these fats.  Check the number of milligrams (mg) of salt (sodium) in one serving. Most people should limit total sodium intake to less than 2,300 mg per day.  Always check the nutrition information of foods labeled as "low-fat" or "nonfat." These foods may be higher in added sugar or refined carbs and should be avoided.  Talk to your dietitian to identify your daily goals for nutrients listed on the label. Shopping  Avoid buying canned, pre-made, or processed foods. These foods tend to be high in  fat, sodium, and added sugar.  Shop around the outside edge of the grocery store. This is where you will most often find fresh fruits and vegetables, bulk grains, fresh meats, and fresh dairy. Cooking  Use low-heat cooking methods, such as baking, instead of high-heat cooking methods like deep frying.  Cook using healthy oils, such as olive, canola, or sunflower oil.  Avoid cooking with butter, cream, or high-fat meats. Meal planning  Eat meals and snacks regularly, preferably at the same times every day. Avoid going long periods of time without eating.  Eat foods that are high in fiber, such as fresh fruits, vegetables, beans, and whole grains. Talk with your dietitian about how many servings of carbs you can eat at each meal.  Eat 4-6 oz (112-168 g) of lean protein each day, such as lean meat, chicken, fish, eggs, or tofu. One ounce (oz) of lean protein is equal to: ? 1 oz (28 g) of meat, chicken, or fish. ? 1 egg. ?  cup (62 g) of tofu.  Eat some  foods each day that contain healthy fats, such as avocado, nuts, seeds, and fish.   What foods should I eat? Fruits Berries. Apples. Oranges. Peaches. Apricots. Plums. Grapes. Mango. Papaya. Pomegranate. Kiwi. Cherries. Vegetables Lettuce. Spinach. Leafy greens, including kale, chard, collard greens, and mustard greens. Beets. Cauliflower. Cabbage. Broccoli. Carrots. Green beans. Tomatoes. Peppers. Onions. Cucumbers. Brussels sprouts. Grains Whole grains, such as whole-wheat or whole-grain bread, crackers, tortillas, cereal, and pasta. Unsweetened oatmeal. Quinoa. Brown or wild rice. Meats and other proteins Seafood. Poultry without skin. Lean cuts of poultry and beef. Tofu. Nuts. Seeds. Dairy Low-fat or fat-free dairy products such as milk, yogurt, and cheese. The items listed above may not be a complete list of foods and beverages you can eat. Contact a dietitian for more information. What foods should I avoid? Fruits Fruits canned  with syrup. Vegetables Canned vegetables. Frozen vegetables with butter or cream sauce. Grains Refined white flour and flour products such as bread, pasta, snack foods, and cereals. Avoid all processed foods. Meats and other proteins Fatty cuts of meat. Poultry with skin. Breaded or fried meats. Processed meat. Avoid saturated fats. Dairy Full-fat yogurt, cheese, or milk. Beverages Sweetened drinks, such as soda or iced tea. The items listed above may not be a complete list of foods and beverages you should avoid. Contact a dietitian for more information. Questions to ask a health care provider  Do I need to meet with a diabetes educator?  Do I need to meet with a dietitian?  What number can I call if I have questions?  When are the best times to check my blood glucose? Where to find more information:  American Diabetes Association: diabetes.org  Academy of Nutrition and Dietetics: www.eatright.[employer] of Diabetes and Digestive and Kidney Diseases: CarFlippers.tn  Association of Diabetes Care and Education Specialists: www.diabeteseducator.org Summary  It is important to have healthy eating habits because your blood sugar (glucose) levels are greatly affected by what you eat and drink.  A healthy meal plan will help you control your blood glucose and maintain a healthy lifestyle.  Your health care provider may recommend that you work with a dietitian to make a meal plan that is best for you.  Keep in mind that carbohydrates (carbs) and alcohol have immediate effects on your blood glucose levels. It is important to count carbs and to use alcohol carefully. This information is not intended to replace advice given to you by your health care provider. Make sure you discuss any questions you have with your health care provider. Document Revised: 05/14/2019 Document Reviewed: 05/14/2019 Elsevier Patient Education  2021 [employer].

## 2020-08-18 ENCOUNTER — Telehealth: Payer: Self-pay | Admitting: Nurse Practitioner

## 2020-08-18 NOTE — Telephone Encounter (Signed)
Called patient to review medication questions for jardiance 10mg   and metformin 1000 mg. No answer, left message to call clinic back .

## 2020-08-18 NOTE — Telephone Encounter (Signed)
Patient unsure if she should be taking empagliflozin (JARDIANCE) 10 MG TABS tablet and the metFORMIN (GLUCOPHAGE) 1000 MG tablet at the same time or should she d/c one of the medications, please advise

## 2020-08-19 ENCOUNTER — Telehealth: Payer: Self-pay | Admitting: *Deleted

## 2020-08-19 NOTE — Telephone Encounter (Signed)
Patient called, left VM to return the call to the office to speak to a nurse about her diabetes medications.

## 2020-08-19 NOTE — Telephone Encounter (Signed)
Patient should be taking both medications, Metformin and Jardiance.

## 2020-08-19 NOTE — Telephone Encounter (Signed)
Patient calling to verify she should be taking Jardiance (prescribed on 06/25/20) in addition to her Metformin and Glipizide. Per OV note on 06/25/20-A1c is not at goal and Jardiance was added to her medications. Informed patient she should take Jadiance, Metformin and Glipizide for diabetic control. She has follow up on 08/24/20.Patient verbalized understanding.

## 2020-08-20 ENCOUNTER — Telehealth: Payer: Self-pay

## 2020-08-20 NOTE — Telephone Encounter (Signed)
Called pt advised pt of Karen's message. Pt verbalized understanding and will reach out to them to see about scheduling.

## 2020-08-20 NOTE — Telephone Encounter (Signed)
Pt called in stating that she is having abdominal pain and loose stools here recently the pain is worse, however she has been dealing with this for 7-8 months. Pt states that she has had a colonoscopy and is wondering if she should see someone here or if she needs to reach out to her G.I[facility] doctor. She states that she doesn't want to end up with two visits. Please advise.

## 2020-08-20 NOTE — Telephone Encounter (Signed)
Due to the length of time this has been going on it would be best to see GI for evaluation and treatment.  That is likely where I would be sending her if she came in.

## 2020-08-24 ENCOUNTER — Ambulatory Visit: Payer: Managed Care, Other (non HMO) | Admitting: Pharmacist

## 2020-08-24 DIAGNOSIS — E119 Type 2 diabetes mellitus without complications: Secondary | ICD-10-CM

## 2020-08-24 NOTE — Progress Notes (Signed)
  Chronic Care Management   Follow Up Note   08/24/2020 Name: Yesenia Stevens MRN: [id number] DOB: 1966/04/08  Referred by: Larae Grooms, NP Reason for referral : Medication assistance  Yesenia Stevens is a 55 y.o. year old female who is a primary care patient of Larae Grooms, NP. The CCM team was consulted for assistance with chronic disease management and care coordination needs.    Review of patient status, including review of consultants reports, relevant laboratory and other test results, and collaboration with appropriate care team members and the patient's provider was performed as part of comprehensive patient evaluation and provision of chronic care management services.    SDOH (Social Determinants of Health) assessments performed: Yes See Care Plan activities for detailed interventions related to [facility])     Outpatient Encounter Medications as of 08/24/2020  Medication Sig Note  . albuterol (PROVENTIL HFA;VENTOLIN HFA) 108 (90 Base) MCG/ACT inhaler Inhale 1-2 puffs into the lungs every 6 (six) hours as needed for wheezing or shortness of breath. 03/22/2018: PRN  . cholestyramine light (PREVALITE) 4 g packet Take 1 packet (4 g total) by mouth daily.   . diclofenac sodium (VOLTAREN) 1 % GEL Apply 2 g topically 4 (four) times daily. 08/21/2019: As needed  . empagliflozin (JARDIANCE) 10 MG TABS tablet Take 1 tablet (10 mg total) by mouth daily before breakfast.   . glipiZIDE (GLUCOTROL) 10 MG tablet Take 1 tablet (10 mg total) by mouth 2 (two) times daily before a meal.   . hydrochlorothiazide (HYDRODIURIL) 25 MG tablet Take 1 tablet (25 mg total) by mouth daily.   Marland Kitchen lisinopril (ZESTRIL) 40 MG tablet Take 1 tablet (40 mg total) by mouth daily.   . metFORMIN (GLUCOPHAGE) 1000 MG tablet Take 1 tablet (1,000 mg total) by mouth 2 (two) times daily with a meal.    No facility-administered encounter medications on file as of 08/24/2020.     Objective:   Goals Addressed   None      There are no care plans to display for this patient.  Diabetes (A1c goal <7%) -Uncontrolled -Current medications: . Jardiance 10 mg qd . Metformin 1000 mg bid . Glipizide 10 mg qd -Medications previously tried: NA  -Current home glucose readings Not checking at home--does nothave a meter . fasting glucose: not checking  . post prandial glucose: not checking -Denies hypoglycemic/hyperglycemic symptoms -Denies any missed doses. Fill dates are current.  -Educated onA1c and blood sugar goals; Complications of diabetes including kidney damage, retinal damage, and cardiovascular disease; Benefits of routine self-monitoring of blood sugar; -Counseled to check feet daily and get yearly eye exams -Counseled on diet and exercise extensively Recommended to continue current medication Assessed patient finances. Patient reports $10 copay card is affordable. She states her insurance plan provides zero coverage for glucometer or supplies. will explore options for helping patient obtain discounted or free meter and strips.    Plan:   Telephone follow up appointment with care management team member scheduled for:  CPA to follow-up with patient in 2 weeks.  Mercer Pod. Tiburcio Pea PharmD, BCPS Clinical Pharmacist [facility] [phone]

## 2020-08-31 ENCOUNTER — Other Ambulatory Visit: Payer: Self-pay | Admitting: Family Medicine

## 2020-09-04 ENCOUNTER — Telehealth: Payer: Self-pay

## 2020-09-04 DIAGNOSIS — Z599 Problem related to housing and economic circumstances, unspecified: Secondary | ICD-10-CM

## 2020-09-04 NOTE — Telephone Encounter (Signed)
Copied from CRM [id number]. Topic: General - Other >> Sep 04, 2020  4:41 PM Pawlus, Maxine Glenn A wrote: Reason for CRM: Pt called in about her medication JARDIANCE 10 MG TABS tablet, wanted to discuss if there is a cheaper option available or a program to discount this medication. Please call back.

## 2020-09-07 ENCOUNTER — Ambulatory Visit: Payer: Managed Care, Other (non HMO) | Admitting: Gastroenterology

## 2020-09-07 NOTE — Telephone Encounter (Signed)
Patient notified and verbalized understanding. 

## 2020-09-07 NOTE — Telephone Encounter (Signed)
Referral placed to CCM to see if Raynelle Fanning is able to help patient with the cost or find an alternative medication to [place].

## 2020-09-07 NOTE — Telephone Encounter (Signed)
Is this something that Raynelle Fanning may be able to help the patient with?

## 2020-10-15 ENCOUNTER — Other Ambulatory Visit: Payer: Self-pay | Admitting: Family Medicine

## 2020-10-15 NOTE — Telephone Encounter (Signed)
Lvm to schedule appt is now due for f/u

## 2020-10-15 NOTE — Telephone Encounter (Signed)
Requested medications are due for refill today.  yes  Requested medications are on the active medications list.  yes  Last refill. 09/10/2020  Future visit scheduled.   no  Notes to clinic.  Pt was to schedule follow up in early April. Rx was signed by Dr. Linwood Dibbles , and has no follow up appointment.Please advise.

## 2020-10-19 NOTE — Telephone Encounter (Signed)
Lvm to schedule appt is now due for f/u 

## 2020-10-21 NOTE — Telephone Encounter (Signed)
Pt stated she would call back to schedule apt pt verbalized understanding stated she is out of medication declined to schedule apt.

## 2020-10-21 NOTE — Telephone Encounter (Signed)
Please see message below

## 2020-10-21 NOTE — Telephone Encounter (Signed)
Last refill before follow up.

## 2020-11-20 ENCOUNTER — Other Ambulatory Visit: Payer: Self-pay | Admitting: Family Medicine

## 2021-02-05 ENCOUNTER — Other Ambulatory Visit: Payer: Self-pay

## 2021-02-05 ENCOUNTER — Ambulatory Visit
Admission: EM | Admit: 2021-02-05 | Discharge: 2021-02-05 | Disposition: A | Discharge status: Home / Self Care | Payer: Managed Care, Other (non HMO) | Attending: Sports Medicine | Admitting: Sports Medicine

## 2021-02-05 DIAGNOSIS — M5442 Lumbago with sciatica, left side: Secondary | ICD-10-CM | POA: Diagnosis not present

## 2021-02-05 DIAGNOSIS — M7918 Myalgia, other site: Secondary | ICD-10-CM

## 2021-02-05 DIAGNOSIS — G5702 Lesion of sciatic nerve, left lower limb: Secondary | ICD-10-CM | POA: Diagnosis not present

## 2021-02-05 MED ORDER — ETODOLAC 500 MG PO TABS: 500.0000 mg | ORAL_TABLET | Freq: Two times a day (BID) | ORAL | 0 refills | Status: DC

## 2021-02-05 MED ORDER — METHOCARBAMOL 500 MG PO TABS: 500.0000 mg | ORAL_TABLET | Freq: Two times a day (BID) | ORAL | 0 refills | Status: DC

## 2021-02-05 NOTE — Discharge Instructions (Addendum)
As we discussed, we believe most your symptoms are coming from your sciatic nerve on the left side.  I sent in 2 medications, the first is for inflammation, and the second is for muscle spasm.  Do not take any Motrin, Advil, ibuprofen, Naprosyn, Aleve, or aspirin products.  As I mentioned to you, it would have been ideal to use oral steroids but with your diabetes its not possible at the moment. Please see educational handouts I did give you the name of 2 orthopedic groups and you can contact them.  My suggestion is that you make an appointment with them and if your symptoms resolve then you can always cancel.  You may need a higher level of care including physical therapy or advanced imaging. If your symptoms were to worsen in any way or he developed any incontinence of bowel or bladder then you need to call 911 and go to the ER.

## 2021-02-05 NOTE — ED Provider Notes (Signed)
[facility]    CSN: [id number] Arrival date & time: 02/05/21  1200      History   Chief Complaint No chief complaint on file.   HPI Yesenia Stevens is a 55 y.o. female.   55 year old female who presents for evaluation of left-sided low back and buttock pain.  Her primary care needs are met by [facility] in [place].  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  She reports left-sided low back and buttock pain that is chronic but has been worse the last few days.  Its been a dull ache but it intensified.  No accidents trauma or falls.  It does not go past the buttock does not go down her leg.  She has no foot drop.  No saddle anesthesia.  No incontinence of bowel or bladder.  It is not a workers comp case.  She had to leave work and come to the urgent care today.  No other symptoms or problems are offered.  Of note, she does have morbid obesity and uncontrolled type 2 diabetes.   Past Medical History:  Diagnosis Date   Diabetes mellitus without complication ([place])    Hypertension     Patient Active Problem List   Diagnosis Date Noted   Palpitations 03/19/2020   Diarrhea 03/19/2020   Premature atrial contractions 08/20/2018   Morbid (severe) obesity due to excess calories ([place]) 08/01/2018   Shortness of breath 07/26/2018   Chronic cough 03/22/2018   Diabetes mellitus without complication ([place]) 82/64/1583   Hypertension 03/22/2018    Past Surgical History:  Procedure Laterality Date   CESAREAN SECTION     CHOLECYSTECTOMY     COLONOSCOPY WITH PROPOFOL N/A 12/10/2019   Procedure: COLONOSCOPY WITH PROPOFOL;  Surgeon: [name], MD;  Location: [facility] ENDOSCOPY;  Service: Gastroenterology;  Laterality: N/A;   COLONOSCOPY WITH PROPOFOL N/A 12/24/2019   Procedure: COLONOSCOPY WITH PROPOFOL;  Surgeon: [name], MD;  Location: [facility] ENDOSCOPY;  Service: Gastroenterology;  Laterality: N/A;    OB History   No obstetric history on file.      Home  Medications    Prior to Admission medications   Medication Sig Start Date End Date Taking? Authorizing Provider  albuterol (PROVENTIL HFA;VENTOLIN HFA) 108 (90 Base) MCG/ACT inhaler Inhale 1-2 puffs into the lungs every 6 (six) hours as needed for wheezing or shortness of breath. 07/19/16  Yes Hagler, Jami L, PA-C  diclofenac sodium (VOLTAREN) 1 % GEL Apply 2 g topically 4 (four) times daily. 06/25/18  Yes Volney American, PA-C  etodolac (LODINE) 500 MG tablet Take 1 tablet (500 mg total) by mouth 2 (two) times daily. 02/05/21  Yes Verda Cumins, MD  glipiZIDE (GLUCOTROL) 10 MG tablet Take 1 tablet (10 mg total) by mouth 2 (two) times daily before a meal. 06/25/20  Yes Rumball, Jake Church, DO  hydrochlorothiazide (HYDRODIURIL) 25 MG tablet Take 1 tablet by mouth once daily 11/20/20  Yes Jon Billings, NP  JARDIANCE 10 MG TABS tablet TAKE 1 TABLET BY MOUTH ONCE DAILY BEFORE BREAKFAST 10/21/20  Yes Jon Billings, NP  lisinopril (ZESTRIL) 40 MG tablet Take 1 tablet (40 mg total) by mouth daily. 06/25/20  Yes Myles Gip, DO  metFORMIN (GLUCOPHAGE) 1000 MG tablet Take 1 tablet (1,000 mg total) by mouth 2 (two) times daily with a meal. 06/25/20  Yes Rumball, Jake Church, DO  methocarbamol (ROBAXIN) 500 MG tablet Take 1 tablet (500 mg total) by mouth 2 (two) times daily. 02/05/21  Yes  Verda Cumins, MD  cholestyramine light (PREVALITE) 4 g packet Take 1 packet (4 g total) by mouth daily. 11/28/19 02/26/20  [name], MD    Family History Family History  Problem Relation Age of Onset   Hypertension Mother    Diabetes Mother     Social History Social History   Tobacco Use   Smoking status: Never   Smokeless tobacco: Never  Vaping Use   Vaping Use: Never used  Substance Use Topics   Alcohol use: Never    Alcohol/week: 0.0 standard drinks   Drug use: Never     Allergies   Patient has no known allergies.   Review of Systems Review of Systems  Constitutional:  Positive for  activity change. Negative for appetite change, chills, diaphoresis, fatigue and fever.  HENT:  Negative for congestion, ear pain, postnasal drip, rhinorrhea, sinus pressure, sinus pain, sneezing and sore throat.   Eyes:  Negative for pain.  Respiratory:  Negative for cough, chest tightness and shortness of breath.   Cardiovascular:  Negative for chest pain and palpitations.  Gastrointestinal:  Negative for abdominal pain, diarrhea, nausea and vomiting.  Genitourinary:  Negative for dysuria.  Musculoskeletal:  Positive for back pain and gait problem. Negative for myalgias and neck pain.  Skin:  Negative for color change, pallor, rash and wound.  Neurological:  Positive for numbness. Negative for dizziness, weakness, light-headedness and headaches.  All other systems reviewed and are negative.   Physical Exam Triage Vital Signs ED Triage Vitals  Enc Vitals Group     BP 02/05/21 1216 (!) 150/72     Pulse Rate 02/05/21 1216 79     Resp 02/05/21 1216 16     Temp 02/05/21 1216 98.3 F (36.8 C)     Temp Source 02/05/21 1216 Oral     SpO2 02/05/21 1216 100 %     Weight 02/05/21 1215 300 lb (136.1 kg)     Height 02/05/21 1215 5' 4"  (1.626 m)     Head Circumference --      Peak Flow --      Pain Score 02/05/21 1215 8     Pain Loc --      Pain Edu? --      Excl. in [place]? --    No data found.  Updated Vital Signs BP (!) 150/72 (BP Location: Left Arm) Comment: pt in pain and has white coat  Pulse 79   Temp 98.3 F (36.8 C) (Oral)   Resp 16   Ht 5' 4"  (1.626 m)   Wt 136.1 kg   LMP 08/20/2015 (Approximate) Comment: denies preg  SpO2 100%   BMI 51.49 kg/m   Visual Acuity Right Eye Distance:   Left Eye Distance:   Bilateral Distance:    Right Eye Near:   Left Eye Near:    Bilateral Near:     Physical Exam Vitals and nursing note reviewed.  Constitutional:      General: She is not in acute distress.    Appearance: Normal appearance. She is not ill-appearing, toxic-appearing  or diaphoretic.  HENT:     Head: Normocephalic and atraumatic.     Nose: Nose normal.     Mouth/Throat:     Mouth: Mucous membranes are moist.  Eyes:     Conjunctiva/sclera: Conjunctivae normal.     Pupils: Pupils are equal, round, and reactive to light.  Cardiovascular:     Rate and Rhythm: Normal rate and regular rhythm.     Pulses:  Normal pulses.     Heart sounds: Normal heart sounds. No murmur heard.   No friction rub. No gallop.  Pulmonary:     Effort: Pulmonary effort is normal.     Breath sounds: Normal breath sounds. No stridor. No wheezing, rhonchi or rales.  Musculoskeletal:     Cervical back: Normal range of motion and neck supple.     Comments: Lumbar sacral spine: No tenderness palpation over all the vertebrae facets.  There is some mild paraspinous muscle spasm on the left side in the lower lumbar area that emanates into the buttock area.  She is tender to palpation there.  Forward flexion to about the mid tibia.  There is no evidence of scoliosis or kyphosis.  Extension to 5 degrees causes no discomfort.  Lateral side bends and rotation are within normal limits.  There is no strength deficits noted bilateral lower extremities.  She has 2+ deep tendon reflexes that are equal and symmetrical at the Achilles tendon and patellar tendon bilateral.  She is able to walk on heels and toes in the office today with no focal weakness.  Skin:    General: Skin is warm and dry.     Capillary Refill: Capillary refill takes less than 2 seconds.     Coloration: Skin is not jaundiced.     Findings: No erythema or rash.  Neurological:     General: No focal deficit present.     Mental Status: She is alert and oriented to person, place, and time.     UC Treatments / Results  Labs (all labs ordered are listed, but only abnormal results are displayed) Labs Reviewed - No data to display  EKG   Radiology No results found.  Procedures Procedures (including critical care  time)  Medications Ordered in UC Medications - No data to display  Initial Impression / Assessment and Plan / UC Course  I have reviewed the triage vital signs and the nursing notes.  Pertinent labs & imaging results that were available during my care of the patient were reviewed by me and considered in my medical decision making (see chart for details).  Clinical impression: 1.  Acute on chronic low back pain with left-sided sciatica 2.  Left buttock pain 3.  Piriformis syndrome on the left side  Treatment plan: 1.  The findings and treatment plan were discussed in detail with the patient.  Patient was in agreement. 2.  I did recommend medical management.  Ideally, oral steroids would be a good first-line but given her uncontrolled diabetes I will go ahead and give her Lodine.  I do not want her to take any other anti-inflammatories and that was explained in detail to her.  She can take Tylenol only.  Also provided her Robaxin for muscle spasm. 3.  Gave her the names and numbers of 2 local orthopedic clinics that she can call as she may need formal physical therapy or advanced imaging. 4.  Provided a work note keep her out of work for few days. 5.  Educational handouts provided. 6.  If symptoms persist recommend she see her primary care physician if she cannot get into orthopedics. 7.  Discussed the red flag signs and symptoms and when to call 911 or go to the ER.  She voiced verbal understanding. 8.  She was discharged in stable condition and will follow-up here as needed.    Final Clinical Impressions(s) / UC Diagnoses   Final diagnoses:  Acute left-sided low back pain  with left-sided sciatica  Left buttock pain  Piriformis syndrome of left side     Discharge Instructions      As we discussed, we believe most your symptoms are coming from your sciatic nerve on the left side.  I sent in 2 medications, the first is for inflammation, and the second is for muscle spasm.  Do not  take any Motrin, Advil, ibuprofen, Naprosyn, Aleve, or aspirin products.  As I mentioned to you, it would have been ideal to use oral steroids but with your diabetes its not possible at the moment. Please see educational handouts I did give you the name of 2 orthopedic groups and you can contact them.  My suggestion is that you make an appointment with them and if your symptoms resolve then you can always cancel.  You may need a higher level of care including physical therapy or advanced imaging. If your symptoms were to worsen in any way or he developed any incontinence of bowel or bladder then you need to call 911 and go to the ER.     ED Prescriptions     Medication Sig Dispense Auth. Provider   etodolac (LODINE) 500 MG tablet Take 1 tablet (500 mg total) by mouth 2 (two) times daily. 30 tablet Verda Cumins, MD   methocarbamol (ROBAXIN) 500 MG tablet Take 1 tablet (500 mg total) by mouth 2 (two) times daily. 20 tablet Verda Cumins, MD      PDMP not reviewed this encounter.   Verda Cumins, MD 02/05/21 1306

## 2021-02-05 NOTE — ED Triage Notes (Signed)
Pt states that she has always had a dull pain in left lower back, it has got worst over the past 3-4 days, making it hard to walking.

## 2021-02-24 ENCOUNTER — Other Ambulatory Visit: Payer: Self-pay | Admitting: Nurse Practitioner

## 2021-02-25 NOTE — Telephone Encounter (Signed)
Patient last seen in January and was due for a follow up in April. Please call to schedule a follow up appointment and then route to provider for refill once scheduled.

## 2021-02-25 NOTE — Telephone Encounter (Signed)
Requested medications are due for refill today.  yes  Requested medications are on the active medications list.  yes  Last refill. 10/21/2020  Future visit scheduled.   no  Notes to clinic.  Pt was last seen 06/25/2020 by Dr. Linwood Dibbles.

## 2021-03-12 ENCOUNTER — Ambulatory Visit
Admission: EM | Admit: 2021-03-12 | Discharge: 2021-03-12 | Disposition: A | Discharge status: Home / Self Care | Payer: Managed Care, Other (non HMO) | Attending: Emergency Medicine | Admitting: Emergency Medicine

## 2021-03-12 ENCOUNTER — Other Ambulatory Visit: Payer: Self-pay

## 2021-03-12 DIAGNOSIS — M546 Pain in thoracic spine: Secondary | ICD-10-CM

## 2021-03-12 MED ORDER — BACLOFEN 10 MG PO TABS: 10.0000 mg | ORAL_TABLET | Freq: Three times a day (TID) | ORAL | 0 refills | Status: DC

## 2021-03-12 MED ORDER — IBUPROFEN 600 MG PO TABS: 600.0000 mg | ORAL_TABLET | Freq: Four times a day (QID) | ORAL | 0 refills | Status: DC | PRN

## 2021-03-12 NOTE — Discharge Instructions (Signed)
Take the ibuprofen, 600 mg every 6 hours with food, on a schedule for the next 48 hours and then as needed.  Take the baclofen, 10 mg every 8 hours, on a schedule for the next 48 hours and then as needed.  Apply moist heat to your back for 30 minutes at a time 2-3 times a day to improve blood flow to the area and help remove the lactic acid causing the spasm.  Follow the back exercises given at discharge.  Return for reevaluation for any new or worsening symptoms.  

## 2021-03-12 NOTE — ED Triage Notes (Signed)
Pt c/o back spasms and pain for about a week and a half. Pt reports pain is on the mid-right and does shoot across the lower back sometimes. Pt did take a muscle relaxer she had been previously given and this did help the pain, she does report it made her head "feel weird" so she hasn't taken this again. Pt denies any other symptoms.

## 2021-03-12 NOTE — ED Provider Notes (Signed)
[facility]    CSN: [id number] Arrival date & time: 03/12/21  0836      History   Chief Complaint Chief Complaint  Patient presents with   Back Pain    HPI Yesenia Stevens is a 55 y.o. female.   HPI  55 year old female here for evaluation of back pain.  Patient reports that she is been experiencing back spasms on the right side of her back for the last week and a half.  This is not associated with any radiation into her leg and is not worsened by movement.  She states that she will have a catch in her back at random times.  This is not been associated with any injury or falls.  She denies any numbness, tingling, weakness in her extremities or any saddle anesthesia.  She states she did take a couple of Robaxin which did not help but she has not been using any over-the-counter therapy to help with her back pain.  Past Medical History:  Diagnosis Date   Diabetes mellitus without complication ([facility])    Hypertension     Patient Active Problem List   Diagnosis Date Noted   Palpitations 03/19/2020   Diarrhea 03/19/2020   Premature atrial contractions 08/20/2018   Morbid (severe) obesity due to excess calories ([facility]) 08/01/2018   Shortness of breath 07/26/2018   Chronic cough 03/22/2018   Diabetes mellitus without complication ([facility]) 03/22/2018   Hypertension 03/22/2018    Past Surgical History:  Procedure Laterality Date   CESAREAN SECTION     CHOLECYSTECTOMY     COLONOSCOPY WITH PROPOFOL N/A 12/10/2019   Procedure: COLONOSCOPY WITH PROPOFOL;  Surgeon: Wyline Mood, MD;  Location: [facility] ENDOSCOPY;  Service: Gastroenterology;  Laterality: N/A;   COLONOSCOPY WITH PROPOFOL N/A 12/24/2019   Procedure: COLONOSCOPY WITH PROPOFOL;  Surgeon: Wyline Mood, MD;  Location: [facility] ENDOSCOPY;  Service: Gastroenterology;  Laterality: N/A;    OB History   No obstetric history on file.      Home Medications    Prior to Admission medications   Medication Sig Start Date End  Date Taking? Authorizing Provider  albuterol (PROVENTIL HFA;VENTOLIN HFA) 108 (90 Base) MCG/ACT inhaler Inhale 1-2 puffs into the lungs every 6 (six) hours as needed for wheezing or shortness of breath. 07/19/16  Yes Hagler, Jami L, PA-C  baclofen (LIORESAL) 10 MG tablet Take 1 tablet (10 mg total) by mouth 3 (three) times daily. 03/12/21  Yes Becky Augusta, NP  diclofenac sodium (VOLTAREN) 1 % GEL Apply 2 g topically 4 (four) times daily. 06/25/18  Yes Particia Nearing, PA-C  glipiZIDE (GLUCOTROL) 10 MG tablet Take 1 tablet (10 mg total) by mouth 2 (two) times daily before a meal. 06/25/20  Yes Rumball, Darl Householder, DO  hydrochlorothiazide (HYDRODIURIL) 25 MG tablet Take 1 tablet by mouth once daily 11/20/20  Yes Larae Grooms, NP  ibuprofen (ADVIL) 600 MG tablet Take 1 tablet (600 mg total) by mouth every 6 (six) hours as needed. 03/12/21  Yes Becky Augusta, NP  JARDIANCE 10 MG TABS tablet TAKE 1 TABLET BY MOUTH ONCE DAILY BEFORE BREAKFAST 03/02/21  Yes Larae Grooms, NP  lisinopril (ZESTRIL) 40 MG tablet Take 1 tablet (40 mg total) by mouth daily. 06/25/20  Yes Caro Laroche, DO  metFORMIN (GLUCOPHAGE) 1000 MG tablet Take 1 tablet (1,000 mg total) by mouth 2 (two) times daily with a meal. 06/25/20  Yes Rumball, Darl Householder, DO  cholestyramine light (PREVALITE) 4 g packet Take 1 packet (4 g total)  by mouth daily. 11/28/19 02/26/20  Wyline Mood, MD    Family History Family History  Problem Relation Age of Onset   Hypertension Mother    Diabetes Mother     Social History Social History   Tobacco Use   Smoking status: Never   Smokeless tobacco: Never  Vaping Use   Vaping Use: Never used  Substance Use Topics   Alcohol use: Never    Alcohol/week: 0.0 standard drinks   Drug use: Never     Allergies   Patient has no known allergies.   Review of Systems Review of Systems  Constitutional:  Negative for fever.  Genitourinary:  Negative for dysuria, flank pain, frequency, hematuria and  urgency.  Musculoskeletal:  Positive for back pain.       Right-sided thoracic back pain  Neurological:  Negative for weakness and numbness.  Hematological: Negative.   Psychiatric/Behavioral: Negative.      Physical Exam Triage Vital Signs ED Triage Vitals  Enc Vitals Group     BP 03/12/21 0851 (!) 162/86     Pulse Rate 03/12/21 0851 76     Resp 03/12/21 0851 18     Temp 03/12/21 0851 98.2 F (36.8 C)     Temp Source 03/12/21 0851 Oral     SpO2 03/12/21 0851 99 %     Weight 03/12/21 0849 (!) 320 lb (145.2 kg)     Height 03/12/21 0849 5\' 4"  (1.626 m)     Head Circumference --      Peak Flow --      Pain Score 03/12/21 0848 8     Pain Loc --      Pain Edu? --      Excl. in [place]? --    No data found.  Updated Vital Signs BP (!) 162/86 (BP Location: Left Arm)   Pulse 76   Temp 98.2 F (36.8 C) (Oral)   Resp 18   Ht 5\' 4"  (1.626 m)   Wt (!) 320 lb (145.2 kg)   LMP 08/20/2015 (Approximate) Comment: denies preg  SpO2 99%   BMI 54.93 kg/m   Visual Acuity Right Eye Distance:   Left Eye Distance:   Bilateral Distance:    Right Eye Near:   Left Eye Near:    Bilateral Near:     Physical Exam Vitals and nursing note reviewed.  Constitutional:      General: She is not in acute distress.    Appearance: Normal appearance. She is obese. She is not ill-appearing.  HENT:     Head: Normocephalic and atraumatic.  Cardiovascular:     Rate and Rhythm: Normal rate and regular rhythm.     Pulses: Normal pulses.     Heart sounds: Normal heart sounds. No murmur heard.   No gallop.  Pulmonary:     Effort: Pulmonary effort is normal.     Breath sounds: Normal breath sounds. No wheezing, rhonchi or rales.  Abdominal:     Tenderness: There is no right CVA tenderness or left CVA tenderness.  Musculoskeletal:        General: No swelling or tenderness.  Skin:    General: Skin is warm and dry.     Capillary Refill: Capillary refill takes less than 2 seconds.     Findings: No  erythema or rash.  Neurological:     General: No focal deficit present.     Mental Status: She is alert and oriented to person, place, and time.  Sensory: No sensory deficit.     Motor: No weakness.     Deep Tendon Reflexes: Reflexes normal.  Psychiatric:        Mood and Affect: Mood normal.        Behavior: Behavior normal.        Thought Content: Thought content normal.        Judgment: Judgment normal.     UC Treatments / Results  Labs (all labs ordered are listed, but only abnormal results are displayed) Labs Reviewed - No data to display  EKG   Radiology No results found.  Procedures Procedures (including critical care time)  Medications Ordered in UC Medications - No data to display  Initial Impression / Assessment and Plan / UC Course  I have reviewed the triage vital signs and the nursing notes.  Pertinent labs & imaging results that were available during my care of the patient were reviewed by me and considered in my medical decision making (see chart for details).  55 year old morbidly obese female with a BMI 54.93 here for evaluation of right-sided thoracic back pain.  She reports that the pain has been present for the last week and a half and does not associate with any fall or injury.  Is also not associated with any heavy lifting.  Patient generally denies any dysuria, urgency or frequency, or hematuria.  No history of renal stones.  No nausea or vomiting.  Patient also denies any saddle anesthesia or numbness or tingling in any of her extremities.  No weakness.  Patient was evaluated a month ago for pain in the left side of her back that radiated down her leg and she states that this pain is different.  She did use some of her leftover Robaxin but it did not help.  Patient's physical exam reveals patient with normal postural carriage who is able to transition from sitting to the exam table without difficulty.  Cardiopulmonary exam with clear lung sounds in all  fields.  Patient is no CVA tenderness on exam.  There is no midline spinal tenderness.  There is mild muscle tension in the right thoracic paraspinous region without overt spasm.  The area is not tender to palpation.  Patient's bilateral lower extremity strength is 5/5 and her DTRs are 1+ globally.  Suspect patient has thoracic back strain and will treat with baclofen and ibuprofen.  Of also encouraged the patient to perform some back exercises that were given in a handout and apply moist heat to help improve blood flow and resolve the pain.  Work note provided.   Final Clinical Impressions(s) / UC Diagnoses   Final diagnoses:  Acute right-sided thoracic back pain     Discharge Instructions      Take the ibuprofen, 600 mg every 6 hours with food, on a schedule for the next 48 hours and then as needed.  Take the baclofen, 10 mg every 8 hours, on a schedule for the next 48 hours and then as needed.  Apply moist heat to your back for 30 minutes at a time 2-3 times a day to improve blood flow to the area and help remove the lactic acid causing the spasm.  Follow the back exercises given at discharge.  Return for reevaluation for any new or worsening symptoms.      ED Prescriptions     Medication Sig Dispense Auth. Provider   baclofen (LIORESAL) 10 MG tablet Take 1 tablet (10 mg total) by mouth 3 (three) times daily. 30  each Becky Augusta, NP   ibuprofen (ADVIL) 600 MG tablet Take 1 tablet (600 mg total) by mouth every 6 (six) hours as needed. 30 tablet Becky Augusta, NP      PDMP not reviewed this encounter.   Becky Augusta, NP 03/12/21 [phone]

## 2021-03-24 ENCOUNTER — Other Ambulatory Visit: Payer: Self-pay

## 2021-03-24 ENCOUNTER — Ambulatory Visit (INDEPENDENT_AMBULATORY_CARE_PROVIDER_SITE_OTHER): Payer: Managed Care, Other (non HMO) | Admitting: Nurse Practitioner

## 2021-03-24 ENCOUNTER — Encounter: Payer: Self-pay | Admitting: Nurse Practitioner

## 2021-03-24 VITALS — BP 139/91 | HR 84 | Ht 64.0 in | Wt 309.0 lb

## 2021-03-24 DIAGNOSIS — Z1159 Encounter for screening for other viral diseases: Secondary | ICD-10-CM

## 2021-03-24 DIAGNOSIS — Z0000 Encounter for general adult medical examination without abnormal findings: Secondary | ICD-10-CM

## 2021-03-24 DIAGNOSIS — Z1231 Encounter for screening mammogram for malignant neoplasm of breast: Secondary | ICD-10-CM

## 2021-03-24 DIAGNOSIS — I10 Essential (primary) hypertension: Secondary | ICD-10-CM

## 2021-03-24 DIAGNOSIS — E119 Type 2 diabetes mellitus without complications: Secondary | ICD-10-CM

## 2021-03-24 DIAGNOSIS — Z114 Encounter for screening for human immunodeficiency virus [HIV]: Secondary | ICD-10-CM

## 2021-03-24 LAB — MICROSCOPIC EXAMINATION
Bacteria, UA: NONE SEEN
WBC, UA: NONE SEEN /hpf (ref 0–5)

## 2021-03-24 LAB — URINALYSIS, ROUTINE W REFLEX MICROSCOPIC
Bilirubin, UA: NEGATIVE
Ketones, UA: NEGATIVE
Leukocytes,UA: NEGATIVE
Nitrite, UA: NEGATIVE
Protein,UA: NEGATIVE
Specific Gravity, UA: 1.01 (ref 1.005–1.030)
Urobilinogen, Ur: 0.2 mg/dL (ref 0.2–1.0)
pH, UA: 5 (ref 5.0–7.5)

## 2021-03-24 MED ORDER — GLIPIZIDE 10 MG PO TABS: 10.0000 mg | ORAL_TABLET | Freq: Two times a day (BID) | ORAL | 1 refills | Status: DC

## 2021-03-24 NOTE — Assessment & Plan Note (Signed)
Chronic.  Last A1c was 8.7.  Will make further recommendations based on lab results.  Continue with current medication regimen.  Labs ordered today.  Return to clinic in 6 months for reevaluation.  Call sooner if concerns arise.

## 2021-03-24 NOTE — Assessment & Plan Note (Signed)
Recommend a healthy lifestyle through diet and exercise.  °

## 2021-03-24 NOTE — Progress Notes (Signed)
BP (!) 139/91 (BP Location: Right Wrist, Patient Position: Sitting, Cuff Size: Normal)   Pulse 84   Ht 5\' 4"  (1.626 m)   Wt (!) 309 lb (140.2 kg)   LMP 08/20/2015 (Approximate) Comment: denies preg  SpO2 98%   BMI 53.04 kg/m    Subjective:    Patient ID: Yesenia Stevens, female    DOB: Sep 25, 1965, 55 y.o.   MRN: [id number]  HPI: Yesenia Stevens is a 55 y.o. female presenting on 03/24/2021 for comprehensive medical examination. Current medical complaints include:none  She currently lives with: Menopausal Symptoms: no  HYPERTENSION / HYPERLIPIDEMIA Satisfied with current treatment? no Duration of hypertension: years BP monitoring frequency: not checking BP range:  BP medication side effects: no Past BP meds: lisinopril Duration of hyperlipidemia: years Cholesterol medication side effects: no Cholesterol supplements: none Past cholesterol medications: none Medication compliance: excellent compliance Aspirin: no Recent stressors: no Recurrent headaches: no Visual changes: no Palpitations: no Dyspnea: no Chest pain: no Lower extremity edema: no Dizzy/lightheaded: no  DIABETES Hypoglycemic episodes:no Polydipsia/polyuria: no Visual disturbance: no Chest pain: no Paresthesias: no Glucose Monitoring: no  Accucheck frequency: Not Checking  Fasting glucose:  Post prandial:  Evening:  Before meals: Taking Insulin?: no  Long acting insulin:  Short acting insulin: Blood Pressure Monitoring: not checking Retinal Examination: Not up to Date Foot Exam: Up to Date Diabetic Education: Not Completed Pneumovax: Not up to Date Influenza: Not up to Date Aspirin: no  Depression Screen done today and results listed below:  Depression screen [facility] 2/9 03/24/2021 08/12/2019 07/06/2018  Decreased Interest 0 0 0  Down, Depressed, Hopeless 0 0 0  PHQ - 2 Score 0 0 0  Altered sleeping 0 - 0  Tired, decreased energy 0 - 0  Change in appetite 0 - 0  Feeling bad or failure  about yourself  0 - 0  Trouble concentrating 0 - 0  Moving slowly or fidgety/restless 0 - 0  Suicidal thoughts 0 - 0  PHQ-9 Score 0 - 0  Difficult doing work/chores Not difficult at all - Not difficult at all    The patient does not have a history of falls. I did complete a risk assessment for falls. A plan of care for falls was documented.   Past Medical History:  Past Medical History:  Diagnosis Date   Diabetes mellitus without complication (HCC)    Hypertension     Surgical History:  Past Surgical History:  Procedure Laterality Date   CESAREAN SECTION     CHOLECYSTECTOMY     COLONOSCOPY WITH PROPOFOL N/A 12/10/2019   Procedure: COLONOSCOPY WITH PROPOFOL;  Surgeon: Wyline Mood, MD;  Location: [facility] ENDOSCOPY;  Service: Gastroenterology;  Laterality: N/A;   COLONOSCOPY WITH PROPOFOL N/A 12/24/2019   Procedure: COLONOSCOPY WITH PROPOFOL;  Surgeon: Wyline Mood, MD;  Location: [facility] ENDOSCOPY;  Service: Gastroenterology;  Laterality: N/A;    Medications:  Current Outpatient Medications on File Prior to Visit  Medication Sig   albuterol (PROVENTIL HFA;VENTOLIN HFA) 108 (90 Base) MCG/ACT inhaler Inhale 1-2 puffs into the lungs every 6 (six) hours as needed for wheezing or shortness of breath.   baclofen (LIORESAL) 10 MG tablet Take 1 tablet (10 mg total) by mouth 3 (three) times daily.   diclofenac sodium (VOLTAREN) 1 % GEL Apply 2 g topically 4 (four) times daily.   hydrochlorothiazide (HYDRODIURIL) 25 MG tablet Take 1 tablet by mouth once daily   ibuprofen (ADVIL) 600 MG tablet Take 1 tablet (600 mg total) by mouth  every 6 (six) hours as needed.   JARDIANCE 10 MG TABS tablet TAKE 1 TABLET BY MOUTH ONCE DAILY BEFORE BREAKFAST   lisinopril (ZESTRIL) 40 MG tablet Take 1 tablet (40 mg total) by mouth daily.   metFORMIN (GLUCOPHAGE) 1000 MG tablet Take 1 tablet (1,000 mg total) by mouth 2 (two) times daily with a meal.   No current facility-administered medications on file prior to  visit.    Allergies:  No Known Allergies  Social History:  Social History   Socioeconomic History   Marital status: Single    Spouse name: Not on file   Number of children: Not on file   Years of education: Not on file   Highest education level: Not on file  Occupational History   Not on file  Tobacco Use   Smoking status: Never   Smokeless tobacco: Never  Vaping Use   Vaping Use: Never used  Substance and Sexual Activity   Alcohol use: Never    Alcohol/week: 0.0 standard drinks   Drug use: Never   Sexual activity: Not Currently  Other Topics Concern   Not on file  Social History Narrative   Not on file   Social Determinants of Health   Financial Resource Strain: Low Risk    Difficulty of Paying Living Expenses: Not very hard  Food Insecurity: Not on file  Transportation Needs: Not on file  Physical Activity: Not on file  Stress: Not on file  Social Connections: Not on file  Intimate Partner Violence: Not on file   Social History   Tobacco Use  Smoking Status Never  Smokeless Tobacco Never   Social History   Substance and Sexual Activity  Alcohol Use Never   Alcohol/week: 0.0 standard drinks    Family History:  Family History  Problem Relation Age of Onset   Hypertension Mother    Diabetes Mother     Past medical history, surgical history, medications, allergies, family history and social history reviewed with patient today and changes made to appropriate areas of the chart.   Review of Systems  HENT:         Denies vision changes.  Eyes:  Negative for blurred vision and double vision.  Respiratory:  Negative for shortness of breath.   Cardiovascular:  Negative for chest pain, palpitations and leg swelling.  Neurological:  Negative for dizziness, tingling and headaches.  Endo/Heme/Allergies:  Negative for polydipsia.       Denies Polyuria  All other ROS negative except what is listed above and in the HPI.      Objective:    BP (!) 139/91  (BP Location: Right Wrist, Patient Position: Sitting, Cuff Size: Normal)   Pulse 84   Ht 5\' 4"  (1.626 m)   Wt (!) 309 lb (140.2 kg)   LMP 08/20/2015 (Approximate) Comment: denies preg  SpO2 98%   BMI 53.04 kg/m   Wt Readings from Last 3 Encounters:  03/24/21 (!) 309 lb (140.2 kg)  03/12/21 (!) 320 lb (145.2 kg)  02/05/21 300 lb (136.1 kg)    Physical Exam Vitals and nursing note reviewed.  Constitutional:      General: She is awake. She is not in acute distress.    Appearance: She is well-developed. She is obese. She is not ill-appearing.  HENT:     Head: Normocephalic and atraumatic.     Right Ear: Hearing, tympanic membrane, ear canal and external ear normal. No drainage.     Left Ear: Hearing, tympanic membrane, ear  canal and external ear normal. No drainage.     Nose: Nose normal.     Right Sinus: No maxillary sinus tenderness or frontal sinus tenderness.     Left Sinus: No maxillary sinus tenderness or frontal sinus tenderness.     Mouth/Throat:     Mouth: Mucous membranes are moist.     Pharynx: Oropharynx is clear. Uvula midline. No pharyngeal swelling, oropharyngeal exudate or posterior oropharyngeal erythema.  Eyes:     General: Lids are normal.        Right eye: No discharge.        Left eye: No discharge.     Extraocular Movements: Extraocular movements intact.     Conjunctiva/sclera: Conjunctivae normal.     Pupils: Pupils are equal, round, and reactive to light.     Visual Fields: Right eye visual fields normal and left eye visual fields normal.  Neck:     Thyroid: No thyromegaly.     Vascular: No carotid bruit.     Trachea: Trachea normal.  Cardiovascular:     Rate and Rhythm: Normal rate and regular rhythm.     Heart sounds: Normal heart sounds. No murmur heard.   No gallop.  Pulmonary:     Effort: Pulmonary effort is normal. No accessory muscle usage or respiratory distress.     Breath sounds: Normal breath sounds.  Chest:  Breasts:    Right: Normal.      Left: Normal.  Abdominal:     General: Bowel sounds are normal.     Palpations: Abdomen is soft. There is no hepatomegaly or splenomegaly.     Tenderness: There is no abdominal tenderness.  Musculoskeletal:        General: Normal range of motion.     Cervical back: Normal range of motion and neck supple.     Right lower leg: No edema.     Left lower leg: No edema.  Lymphadenopathy:     Head:     Right side of head: No submental, submandibular, tonsillar, preauricular or posterior auricular adenopathy.     Left side of head: No submental, submandibular, tonsillar, preauricular or posterior auricular adenopathy.     Cervical: No cervical adenopathy.     Upper Body:     Right upper body: No supraclavicular, axillary or pectoral adenopathy.     Left upper body: No supraclavicular, axillary or pectoral adenopathy.  Skin:    General: Skin is warm and dry.     Capillary Refill: Capillary refill takes less than 2 seconds.     Findings: No rash.  Neurological:     Mental Status: She is alert and oriented to person, place, and time.     Cranial Nerves: Cranial nerves are intact.     Gait: Gait is intact.     Deep Tendon Reflexes: Reflexes are normal and symmetric.     Reflex Scores:      Brachioradialis reflexes are 2+ on the right side and 2+ on the left side.      Patellar reflexes are 2+ on the right side and 2+ on the left side. Psychiatric:        Attention and Perception: Attention normal.        Mood and Affect: Mood normal.        Speech: Speech normal.        Behavior: Behavior normal. Behavior is cooperative.        Thought Content: Thought content normal.  Judgment: Judgment normal.    Results for orders placed or performed in visit on 06/25/20  Direct LDL  Result Value Ref Range   LDL Direct 108 (H) 0 - 99 mg/dL  Bayer DCA Hb E9F Waived (STAT)  Result Value Ref Range   HB A1C (BAYER DCA - WAIVED) 8.7 (H) <7.0 %  Microalbumin, Urine Waived (STAT)  Result  Value Ref Range   Microalb, Ur Waived 30 (H) 0 - 19 mg/L   Creatinine, Urine Waived 200 10 - 300 mg/dL   Microalb/Creat Ratio <30 <30 mg/g  CBC  Result Value Ref Range   WBC 6.6 3.4 - 10.8 x10E3/uL   RBC 4.40 3.77 - 5.28 x10E6/uL   Hemoglobin 11.7 11.1 - 15.9 g/dL   Hematocrit 81.0 17.5 - 46.6 %   MCV 82 79 - 97 fL   MCH 26.6 26.6 - 33.0 pg   MCHC 32.6 31.5 - 35.7 g/dL   RDW 10.2 58.5 - 27.7 %   Platelets 333 150 - 450 x10E3/uL  Comprehensive metabolic panel  Result Value Ref Range   Glucose 197 (H) 65 - 99 mg/dL   BUN 11 6 - 24 mg/dL   Creatinine, Ser 8.24 0.57 - 1.00 mg/dL   GFR calc non Af Amer 98 >59 mL/min/1.73   GFR calc Af Amer 113 >59 mL/min/1.73   BUN/Creatinine Ratio 15 9 - 23   Sodium 141 134 - 144 mmol/L   Potassium 4.3 3.5 - 5.2 mmol/L   Chloride 103 96 - 106 mmol/L   CO2 25 20 - 29 mmol/L   Calcium 9.2 8.7 - 10.2 mg/dL   Total Protein 6.7 6.0 - 8.5 g/dL   Albumin 3.8 3.8 - 4.9 g/dL   Globulin, Total 2.9 1.5 - 4.5 g/dL   Albumin/Globulin Ratio 1.3 1.2 - 2.2   Bilirubin Total 0.3 0.0 - 1.2 mg/dL   Alkaline Phosphatase 63 44 - 121 IU/L   AST 14 0 - 40 IU/L   ALT 14 0 - 32 IU/L      Assessment & Plan:   Problem List Items Addressed This Visit       Cardiovascular and Mediastinum   Hypertension    Chronic.  Controlled.  Continue with current medication regimen Lisinopril 40mg .  Labs ordered today.  Return to clinic in 6 months for reevaluation.  Call sooner if concerns arise.          Endocrine   Diabetes mellitus without complication (HCC)    Chronic.  Last A1c was 8.7.  Will make further recommendations based on lab results.  Continue with current medication regimen.  Labs ordered today.  Return to clinic in 6 months for reevaluation.  Call sooner if concerns arise.        Relevant Medications   glipiZIDE (GLUCOTROL) 10 MG tablet   Other Relevant Orders   HgB A1c     Other   Morbid (severe) obesity due to excess calories (HCC)    Recommend a  healthy lifestyle through diet and exercise.       Relevant Medications   glipiZIDE (GLUCOTROL) 10 MG tablet   Other Visit Diagnoses     Annual physical exam    -  Primary   Health mainteance reviewed during visit today. Labs ordered. Declined Vaccines.  Mammogram ordered.    Relevant Orders   CBC with Differential/Platelet   Comprehensive metabolic panel   Lipid panel   TSH   Urinalysis, Routine w reflex microscopic   Encounter for  hepatitis C screening test for low risk patient       Relevant Orders   Hepatitis C antibody   Screening for HIV (human immunodeficiency virus)       Relevant Orders   HIV Antibody (routine testing w rflx)   Encounter for screening mammogram for malignant neoplasm of breast       Relevant Orders   MM Digital Screening        Follow up plan: Return in about 6 months (around 09/22/2021) for HTN, HLD, DM2 FU.   LABORATORY TESTING:  - Pap smear: up to date  IMMUNIZATIONS:   - Tdap: Tetanus vaccination status reviewed: last tetanus booster within 10 years. - Influenza: Refused - Pneumovax: Not applicable - Prevnar: Not applicable - HPV: Not applicable - Zostavax vaccine: Refused  SCREENING: -Mammogram: Ordered today  - Colonoscopy: Up to date  - Bone Density: Not applicable  -Hearing Test: Not applicable  -Spirometry: Not applicable   PATIENT COUNSELING:   Advised to take 1 mg of folate supplement per day if capable of pregnancy.   Sexuality: Discussed sexually transmitted diseases, partner selection, use of condoms, avoidance of unintended pregnancy  and contraceptive alternatives.   Advised to avoid cigarette smoking.  I discussed with the patient that most people either abstain from alcohol or drink within safe limits (<=14/week and <=4 drinks/occasion for males, <=7/weeks and <= 3 drinks/occasion for females) and that the risk for alcohol disorders and other health effects rises proportionally with the number of drinks per week and  how often a drinker exceeds daily limits.  Discussed cessation/primary prevention of drug use and availability of treatment for abuse.   Diet: Encouraged to adjust caloric intake to maintain  or achieve ideal body weight, to reduce intake of dietary saturated fat and total fat, to limit sodium intake by avoiding high sodium foods and not adding table salt, and to maintain adequate dietary potassium and calcium preferably from fresh fruits, vegetables, and low-fat dairy products.    stressed the importance of regular exercise  Injury prevention: Discussed safety belts, safety helmets, smoke detector, smoking near bedding or upholstery.   Dental health: Discussed importance of regular tooth brushing, flossing, and dental visits.    NEXT PREVENTATIVE PHYSICAL DUE IN 1 YEAR. Return in about 6 months (around 09/22/2021) for HTN, HLD, DM2 FU.

## 2021-03-24 NOTE — Assessment & Plan Note (Signed)
Chronic.  Controlled.  Continue with current medication regimen Lisinopril 40mg .  Labs ordered today.  Return to clinic in 6 months for reevaluation.  Call sooner if concerns arise.

## 2021-03-25 LAB — COMPREHENSIVE METABOLIC PANEL
ALT: 17 IU/L (ref 0–32)
AST: 20 IU/L (ref 0–40)
Albumin/Globulin Ratio: 1.3 (ref 1.2–2.2)
Albumin: 4.1 g/dL (ref 3.8–4.9)
Alkaline Phosphatase: 66 IU/L (ref 44–121)
BUN/Creatinine Ratio: 21 (ref 9–23)
BUN: 21 mg/dL (ref 6–24)
Bilirubin Total: 0.3 mg/dL (ref 0.0–1.2)
CO2: 23 mmol/L (ref 20–29)
Calcium: 9.6 mg/dL (ref 8.7–10.2)
Chloride: 98 mmol/L (ref 96–106)
Creatinine, Ser: 1.02 mg/dL — ABNORMAL HIGH (ref 0.57–1.00)
Globulin, Total: 3.1 g/dL (ref 1.5–4.5)
Glucose: 180 mg/dL — ABNORMAL HIGH (ref 70–99)
Potassium: 4.3 mmol/L (ref 3.5–5.2)
Sodium: 138 mmol/L (ref 134–144)
Total Protein: 7.2 g/dL (ref 6.0–8.5)
eGFR: 66 mL/min/{1.73_m2} (ref >59)

## 2021-03-25 LAB — LIPID PANEL
Chol/HDL Ratio: 3.5 ratio (ref 0.0–4.4)
Cholesterol, Total: 202 mg/dL — ABNORMAL HIGH (ref 100–199)
HDL: 57 mg/dL (ref >39)
LDL Chol Calc (NIH): 121 mg/dL — ABNORMAL HIGH (ref 0–99)
Triglycerides: 137 mg/dL (ref 0–149)
VLDL Cholesterol Cal: 24 mg/dL (ref 5–40)

## 2021-03-25 LAB — CBC WITH DIFFERENTIAL/PLATELET
Basophils Absolute: 0 10*3/uL (ref 0.0–0.2)
Basos: 0 %
EOS (ABSOLUTE): 0.2 10*3/uL (ref 0.0–0.4)
Eos: 2 %
Hematocrit: 36.9 % (ref 34.0–46.6)
Hemoglobin: 11.5 g/dL (ref 11.1–15.9)
Immature Grans (Abs): 0 10*3/uL (ref 0.0–0.1)
Immature Granulocytes: 0 %
Lymphocytes Absolute: 3.5 10*3/uL — ABNORMAL HIGH (ref 0.7–3.1)
Lymphs: 34 %
MCH: 26.1 pg — ABNORMAL LOW (ref 26.6–33.0)
MCHC: 31.2 g/dL — ABNORMAL LOW (ref 31.5–35.7)
MCV: 84 fL (ref 79–97)
Monocytes Absolute: 0.9 10*3/uL (ref 0.1–0.9)
Monocytes: 9 %
Neutrophils Absolute: 5.7 10*3/uL (ref 1.4–7.0)
Neutrophils: 55 %
Platelets: 312 10*3/uL (ref 150–450)
RBC: 4.41 x10E6/uL (ref 3.77–5.28)
RDW: 13.9 % (ref 11.7–15.4)
WBC: 10.3 10*3/uL (ref 3.4–10.8)

## 2021-03-25 LAB — TSH: TSH: 1.15 u[IU]/mL (ref 0.450–4.500)

## 2021-03-25 LAB — HEMOGLOBIN A1C
Est. average glucose Bld gHb Est-mCnc: 189 mg/dL
Hgb A1c MFr Bld: 8.2 % — ABNORMAL HIGH (ref 4.8–5.6)

## 2021-03-25 NOTE — Progress Notes (Signed)
Please let patient know that her lab work showed that her A1c has improved from 9.2 to 8.1.  This is still above goal.  I recommend she start a once weekly injection of ozempic to help with A1c control. This will also provide kidney protection.  If she is unsure about giving it to herself she can come in for an appointment and I can show her how to use it.  Her cholesterol is slightly elevated.  I recommend she follow a low fat, low carb diet.  Other blood work looks good.  Please let me know if she agrees to the medication and I can send it in for her.

## 2021-04-05 ENCOUNTER — Other Ambulatory Visit: Payer: Self-pay

## 2021-04-05 ENCOUNTER — Ambulatory Visit (INDEPENDENT_AMBULATORY_CARE_PROVIDER_SITE_OTHER): Payer: Managed Care, Other (non HMO) | Admitting: Nurse Practitioner

## 2021-04-05 ENCOUNTER — Encounter: Payer: Self-pay | Admitting: Nurse Practitioner

## 2021-04-05 VITALS — BP 130/73 | HR 84 | Wt 310.2 lb

## 2021-04-05 DIAGNOSIS — R058 Other specified cough: Secondary | ICD-10-CM

## 2021-04-05 DIAGNOSIS — E119 Type 2 diabetes mellitus without complications: Secondary | ICD-10-CM | POA: Diagnosis not present

## 2021-04-05 DIAGNOSIS — T464X5A Adverse effect of angiotensin-converting-enzyme inhibitors, initial encounter: Secondary | ICD-10-CM

## 2021-04-05 DIAGNOSIS — M546 Pain in thoracic spine: Secondary | ICD-10-CM | POA: Diagnosis not present

## 2021-04-05 DIAGNOSIS — G8929 Other chronic pain: Secondary | ICD-10-CM | POA: Diagnosis not present

## 2021-04-05 MED ORDER — IBUPROFEN 600 MG PO TABS: 600.0000 mg | ORAL_TABLET | Freq: Four times a day (QID) | ORAL | 0 refills | Status: DC | PRN

## 2021-04-05 MED ORDER — EMPAGLIFLOZIN 10 MG PO TABS: 10.0000 mg | ORAL_TABLET | Freq: Every day | ORAL | 1 refills | Status: DC

## 2021-04-05 MED ORDER — AMLODIPINE BESYLATE 10 MG PO TABS: 10.0000 mg | ORAL_TABLET | Freq: Every day | ORAL | 0 refills | Status: DC

## 2021-04-05 MED ORDER — BACLOFEN 10 MG PO TABS: 10.0000 mg | ORAL_TABLET | Freq: Three times a day (TID) | ORAL | 0 refills | Status: DC

## 2021-04-05 NOTE — Progress Notes (Signed)
BP 130/73   Pulse 84   Wt (!) 310 lb 3.2 oz (140.7 kg)   LMP 08/20/2015 (Approximate) Comment: denies preg  SpO2 100%   BMI 53.25 kg/m    Subjective:    Patient ID: Yesenia Stevens, female    DOB: 07-24-1965, 55 y.o.   MRN: [id number]  HPI: [name] is a 55 y.o. female  Chief Complaint  Patient presents with   Back Pain    Patient is here for back pain. Patient states she has been having low grade pain in her lower back. Patient states it feels as if her back is breaking and states when she coughs she has to lean to the side to take some of the pressure off her back.    Sore Throat    Patient states she would like to discuss with provider about a tickle she has had in her throat and it has been an ongoing issue and states she has been by ENT before and informed her they did not see anything. Patient states when she coughs it is from deep within and she has cough so much now that her throat feels raw and sore. Patient states has been dealing with this every year around this time.    Medication Refill    Patient is requesting refill on Ibuprofen 600 mg and Jardiance. Patient states she would like to discuss what she may have been taking the Baclofen and if it helps with pain she would like a refill as well.    BACK PAIN Duration: months Mechanism of injury: unknown Location: Right Onset: gradual Severity: 7/10 Quality: shooting Frequency: constant Radiation: none Aggravating factors: movement and coughing Alleviating factors: nothing Status: worse Treatments attempted: ibuprofen  Relief with NSAIDs?: mild Nighttime pain:  no Paresthesias / decreased sensation:  no Bowel / bladder incontinence:  no Fevers:  no Dysuria / urinary frequency:  no  COUGH Patient states she has a cough that typically comes this time of year.  Feels like she has something stuck in the back of her throat but can't get it out.  Feels like it comes from deep in her lungs.  Makes her  throat sore from cough so much.    Relevant past medical, surgical, family and social history reviewed and updated as indicated. Interim medical history since our last visit reviewed. Allergies and medications reviewed and updated.  Review of Systems  HENT:  Positive for sore throat.   Respiratory:  Positive for cough.   Musculoskeletal:  Positive for back pain.   Per HPI unless specifically indicated above     Objective:    BP 130/73   Pulse 84   Wt (!) 310 lb 3.2 oz (140.7 kg)   LMP 08/20/2015 (Approximate) Comment: denies preg  SpO2 100%   BMI 53.25 kg/m   Wt Readings from Last 3 Encounters:  04/05/21 (!) 310 lb 3.2 oz (140.7 kg)  03/24/21 (!) 309 lb (140.2 kg)  03/12/21 (!) 320 lb (145.2 kg)    Physical Exam Vitals and nursing note reviewed.  Constitutional:      General: She is not in acute distress.    Appearance: Normal appearance. She is normal weight. She is not ill-appearing, toxic-appearing or diaphoretic.  HENT:     Head: Normocephalic.     Right Ear: External ear normal.     Left Ear: External ear normal.     Nose: Nose normal.     Mouth/Throat:     Mouth: Mucous membranes  are moist.     Pharynx: Oropharynx is clear.  Eyes:     General:        Right eye: No discharge.        Left eye: No discharge.     Extraocular Movements: Extraocular movements intact.     Conjunctiva/sclera: Conjunctivae normal.     Pupils: Pupils are equal, round, and reactive to light.  Cardiovascular:     Rate and Rhythm: Normal rate and regular rhythm.     Heart sounds: No murmur heard. Pulmonary:     Effort: Pulmonary effort is normal. No respiratory distress.     Breath sounds: Normal breath sounds. No wheezing or rales.  Musculoskeletal:     Cervical back: Normal range of motion and neck supple.  Skin:    General: Skin is warm and dry.     Capillary Refill: Capillary refill takes less than 2 seconds.  Neurological:     General: No focal deficit present.     Mental  Status: She is alert and oriented to person, place, and time. Mental status is at baseline.  Psychiatric:        Mood and Affect: Mood normal.        Behavior: Behavior normal.        Thought Content: Thought content normal.        Judgment: Judgment normal.    Results for orders placed or performed in visit on 03/24/21  Microscopic Examination   Urine  Result Value Ref Range   WBC, UA None seen 0 - 5 /hpf   RBC 0-2 0 - 2 /hpf   Epithelial Cells (non renal) 0-10 0 - 10 /hpf   Bacteria, UA None seen None seen/Few  CBC with Differential/Platelet  Result Value Ref Range   WBC 10.3 3.4 - 10.8 x10E3/uL   RBC 4.41 3.77 - 5.28 x10E6/uL   Hemoglobin 11.5 11.1 - 15.9 g/dL   Hematocrit 36.9 34.0 - 46.6 %   MCV 84 79 - 97 fL   MCH 26.1 (L) 26.6 - 33.0 pg   MCHC 31.2 (L) 31.5 - 35.7 g/dL   RDW 13.9 11.7 - 15.4 %   Platelets 312 150 - 450 x10E3/uL   Neutrophils 55 Not Estab. %   Lymphs 34 Not Estab. %   Monocytes 9 Not Estab. %   Eos 2 Not Estab. %   Basos 0 Not Estab. %   Neutrophils Absolute 5.7 1.4 - 7.0 x10E3/uL   Lymphocytes Absolute 3.5 (H) 0.7 - 3.1 x10E3/uL   Monocytes Absolute 0.9 0.1 - 0.9 x10E3/uL   EOS (ABSOLUTE) 0.2 0.0 - 0.4 x10E3/uL   Basophils Absolute 0.0 0.0 - 0.2 x10E3/uL   Immature Granulocytes 0 Not Estab. %   Immature Grans (Abs) 0.0 0.0 - 0.1 x10E3/uL  Comprehensive metabolic panel  Result Value Ref Range   Glucose 180 (H) 70 - 99 mg/dL   BUN 21 6 - 24 mg/dL   Creatinine, Ser 1.02 (H) 0.57 - 1.00 mg/dL   eGFR 66 >59 mL/min/1.73   BUN/Creatinine Ratio 21 9 - 23   Sodium 138 134 - 144 mmol/L   Potassium 4.3 3.5 - 5.2 mmol/L   Chloride 98 96 - 106 mmol/L   CO2 23 20 - 29 mmol/L   Calcium 9.6 8.7 - 10.2 mg/dL   Total Protein 7.2 6.0 - 8.5 g/dL   Albumin 4.1 3.8 - 4.9 g/dL   Globulin, Total 3.1 1.5 - 4.5 g/dL   Albumin/Globulin Ratio 1.3 1.2 -  2.2   Bilirubin Total 0.3 0.0 - 1.2 mg/dL   Alkaline Phosphatase 66 44 - 121 IU/L   AST 20 0 - 40 IU/L   ALT 17  0 - 32 IU/L  Lipid panel  Result Value Ref Range   Cholesterol, Total 202 (H) 100 - 199 mg/dL   Triglycerides 137 0 - 149 mg/dL   HDL 57 >39 mg/dL   VLDL Cholesterol Cal 24 5 - 40 mg/dL   LDL Chol Calc (NIH) 121 (H) 0 - 99 mg/dL   Chol/HDL Ratio 3.5 0.0 - 4.4 ratio  TSH  Result Value Ref Range   TSH 1.150 0.450 - 4.500 uIU/mL  Urinalysis, Routine w reflex microscopic  Result Value Ref Range   Specific Gravity, UA 1.010 1.005 - 1.030   pH, UA 5.0 5.0 - 7.5   Color, UA Yellow Yellow   Appearance Ur Clear Clear   Leukocytes,UA Negative Negative   Protein,UA Negative Negative/Trace   Glucose, UA 3+ (A) Negative   Ketones, UA Negative Negative   RBC, UA Trace (A) Negative   Bilirubin, UA Negative Negative   Urobilinogen, Ur 0.2 0.2 - 1.0 mg/dL   Nitrite, UA Negative Negative   Microscopic Examination See below:   HgB A1c  Result Value Ref Range   Hgb A1c MFr Bld 8.2 (H) 4.8 - 5.6 %   Est. average glucose Bld gHb Est-mCnc 189 mg/dL      Assessment & Plan:   Problem List Items Addressed This Visit       Endocrine   Diabetes mellitus without complication (HCC)    Chronic. Not well controlled.  Last 8.1 in Oct 2022. Discussed with patient that this is above goal.  I recommend she start Ozempic 0.66m once weekly Discussed added benefits of A1c control and weight loss with medication.  Patient declines at this time.  Will follow up in 2 months for reevaluation.  Discussed with patient that if she decides to start the medication at any time she can come in and I will show her how to use it.      Relevant Medications   empagliflozin (JARDIANCE) 10 MG TABS tablet   Other Visit Diagnoses     Chronic right-sided thoracic back pain    -  Primary   Referral placed for patient to see Orthopedics. Muscle spasm along the throacic spine. Recommend patient see Orthopedics for further evaluation and management.    Relevant Medications   baclofen (LIORESAL) 10 MG tablet   ibuprofen (ADVIL)  600 MG tablet   Other Relevant Orders   Ambulatory referral to Orthopedics   ACE-inhibitor cough       Will chnage patient from ACE to CCB. Will start Amlodipine 169mdaily. Side effects and benefits discussed. Follow up in 2 months for reevaluation.        Follow up plan: Return in about 2 months (around 06/05/2021) for BP Check.

## 2021-04-05 NOTE — Assessment & Plan Note (Signed)
Chronic. Not well controlled.  Last 8.1 in Oct 2022. Discussed with patient that this is above goal.  I recommend she start Ozempic 0.5mg  once weekly Discussed added benefits of A1c control and weight loss with medication.  Patient declines at this time.  Will follow up in 2 months for reevaluation.  Discussed with patient that if she decides to start the medication at any time she can come in and I will show her how to use it.

## 2021-05-06 ENCOUNTER — Other Ambulatory Visit: Payer: Self-pay | Admitting: Nurse Practitioner

## 2021-05-06 NOTE — Telephone Encounter (Signed)
Requested Prescriptions  Pending Prescriptions Disp Refills  . amLODipine (NORVASC) 10 MG tablet [Pharmacy Med Name: amLODIPine Besylate 10 MG Oral Tablet] 60 tablet 0    Sig: Take 1 tablet by mouth once daily     Cardiovascular:  Calcium Channel Blockers Passed - 05/06/2021  9:58 AM      Passed - Last BP in normal range    BP Readings from Last 1 Encounters:  04/05/21 130/73         Passed - Valid encounter within last 6 months    Recent Outpatient Visits          1 month ago Chronic right-sided thoracic back pain   [facility] Larae Grooms, NP   1 month ago Annual physical exam   [facility] Larae Grooms, NP   10 months ago Diabetes mellitus without complication [facility])   [facility] Caro Laroche, DO   1 year ago Annual physical exam   [facility] Valentino Nose, NP   1 year ago Essential hypertension   [facility] Particia Nearing, [place]      Future Appointments            In 1 month Larae Grooms, NP [facility]   In 4 months Larae Grooms, NP [facility], [facility]

## 2021-06-07 ENCOUNTER — Ambulatory Visit: Payer: Managed Care, Other (non HMO) | Admitting: Nurse Practitioner

## 2021-06-16 DIAGNOSIS — S32422D Displaced fracture of posterior wall of left acetabulum, subsequent encounter for fracture with routine healing: Secondary | ICD-10-CM | POA: Insufficient documentation

## 2021-07-16 DIAGNOSIS — S72041S Displaced fracture of base of neck of right femur, sequela: Secondary | ICD-10-CM | POA: Insufficient documentation

## 2021-09-17 ENCOUNTER — Emergency Department: Payer: Self-pay

## 2021-09-17 ENCOUNTER — Emergency Department
Admission: EM | Admit: 2021-09-17 | Discharge: 2021-09-17 | Disposition: A | Discharge status: Home / Self Care | Payer: Self-pay | Attending: Emergency Medicine | Admitting: Emergency Medicine

## 2021-09-17 ENCOUNTER — Other Ambulatory Visit: Payer: Self-pay

## 2021-09-17 DIAGNOSIS — M545 Low back pain, unspecified: Secondary | ICD-10-CM | POA: Insufficient documentation

## 2021-09-17 DIAGNOSIS — G8911 Acute pain due to trauma: Secondary | ICD-10-CM | POA: Insufficient documentation

## 2021-09-17 DIAGNOSIS — Y9301 Activity, walking, marching and hiking: Secondary | ICD-10-CM | POA: Insufficient documentation

## 2021-09-17 DIAGNOSIS — W1839XA Other fall on same level, initial encounter: Secondary | ICD-10-CM | POA: Insufficient documentation

## 2021-09-17 DIAGNOSIS — W19XXXA Unspecified fall, initial encounter: Secondary | ICD-10-CM

## 2021-09-17 MED ORDER — OXYCODONE HCL 5 MG PO TABS
10.0000 mg | ORAL_TABLET | Freq: Once | ORAL | Status: AC
  Administered 2021-09-17: 10 mg via ORAL
  Filled 2021-09-17: qty 2

## 2021-09-17 MED ORDER — METHOCARBAMOL 500 MG PO TABS
750.0000 mg | ORAL_TABLET | Freq: Once | ORAL | Status: AC
  Administered 2021-09-17: 750 mg via ORAL
  Filled 2021-09-17: qty 2

## 2021-09-17 MED ORDER — METHOCARBAMOL 750 MG PO TABS: 750.0000 mg | ORAL_TABLET | Freq: Three times a day (TID) | ORAL | 0 refills | Status: DC | PRN
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
## 2021-09-17 NOTE — ED Triage Notes (Signed)
Pt states she was ambulating with her walker when she lost her footing and fell. Pt states she is having low back pain, denies other injury.  ?

## 2021-09-17 NOTE — ED Notes (Signed)
Pt complains of lower back pain after she tripped with her walker.  ?

## 2021-09-17 NOTE — ED Notes (Signed)
Patient given warm blanket. Spouse at bedside. ?

## 2021-09-17 NOTE — ED Notes (Signed)
Patient declined discharge vital signs. 

## 2021-09-17 NOTE — ED Notes (Signed)
E-signature pad not functional. 

## 2021-09-17 NOTE — Discharge Instructions (Signed)
You were seen today for back pain status post a fall.  Your x-ray did not show any evidence of acute fracture.  You should continue your home oxycodone as previously prescribed.  I am sending you home with a muscle relaxer that you can take every 8 hours as needed.  Please be aware that this may cause sedation.  Please follow-up with your PCP if symptoms persist or worsen. ?

## 2021-09-22 ENCOUNTER — Encounter: Payer: Self-pay | Admitting: Nurse Practitioner

## 2021-09-22 ENCOUNTER — Ambulatory Visit: Payer: Managed Care, Other (non HMO) | Admitting: Nurse Practitioner

## 2021-09-22 VITALS — BP 145/86 | HR 99 | Temp 98.3°F | Wt 295.4 lb

## 2021-09-22 DIAGNOSIS — E1169 Type 2 diabetes mellitus with other specified complication: Secondary | ICD-10-CM | POA: Insufficient documentation

## 2021-09-22 DIAGNOSIS — M25559 Pain in unspecified hip: Secondary | ICD-10-CM

## 2021-09-22 DIAGNOSIS — E785 Hyperlipidemia, unspecified: Secondary | ICD-10-CM

## 2021-09-22 DIAGNOSIS — I10 Essential (primary) hypertension: Secondary | ICD-10-CM

## 2021-09-22 DIAGNOSIS — E119 Type 2 diabetes mellitus without complications: Secondary | ICD-10-CM

## 2021-09-22 DIAGNOSIS — Z136 Encounter for screening for cardiovascular disorders: Secondary | ICD-10-CM

## 2021-09-22 MED ORDER — METFORMIN HCL 1000 MG PO TABS: 1000.0000 mg | ORAL_TABLET | Freq: Two times a day (BID) | ORAL | 1 refills | Status: DC

## 2021-09-22 MED ORDER — GLIPIZIDE 10 MG PO TABS: 10.0000 mg | ORAL_TABLET | Freq: Two times a day (BID) | ORAL | 1 refills | Status: DC

## 2021-09-22 MED ORDER — EMPAGLIFLOZIN 10 MG PO TABS: 10.0000 mg | ORAL_TABLET | Freq: Every day | ORAL | 1 refills | Status: DC

## 2021-09-22 MED ORDER — AMLODIPINE BESYLATE 10 MG PO TABS: 10.0000 mg | ORAL_TABLET | Freq: Every day | ORAL | 1 refills | Status: DC

## 2021-09-22 MED ORDER — HYDROCHLOROTHIAZIDE 25 MG PO TABS: 25.0000 mg | ORAL_TABLET | Freq: Every day | ORAL | 1 refills | Status: DC

## 2021-09-22 NOTE — Assessment & Plan Note (Signed)
Chronic.  Controlled.  Continue with current medication regimen.  Labs ordered today.  Return to clinic in 6 months for reevaluation.  Call sooner if concerns arise.  ? ?

## 2021-09-22 NOTE — Assessment & Plan Note (Signed)
Chronic.  Elevated at visit today.  Patient discharged from rehab last Tuesday.  She is in some pain today.  Will follow up in 1 month to reevaluation blood pressure.  Will make changes at that time if necessary. Labs ordered today.  Return to clinic in 1 month for reevaluation.  Call sooner if concerns arise.  ? ?
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
## 2021-09-23 LAB — LIPID PANEL
Chol/HDL Ratio: 3.7 ratio (ref 0.0–4.4)
Cholesterol, Total: 180 mg/dL (ref 100–199)
HDL: 49 mg/dL (ref >39)
LDL Chol Calc (NIH): 114 mg/dL — ABNORMAL HIGH (ref 0–99)
Triglycerides: 92 mg/dL (ref 0–149)
VLDL Cholesterol Cal: 17 mg/dL (ref 5–40)

## 2021-09-23 LAB — COMPREHENSIVE METABOLIC PANEL
ALT: 17 IU/L (ref 0–32)
AST: 22 IU/L (ref 0–40)
Albumin/Globulin Ratio: 1.1 — ABNORMAL LOW (ref 1.2–2.2)
Albumin: 3.9 g/dL (ref 3.8–4.9)
Alkaline Phosphatase: 96 IU/L (ref 44–121)
BUN/Creatinine Ratio: 13 (ref 9–23)
BUN: 11 mg/dL (ref 6–24)
Bilirubin Total: 0.3 mg/dL (ref 0.0–1.2)
CO2: 23 mmol/L (ref 20–29)
Calcium: 9.3 mg/dL (ref 8.7–10.2)
Chloride: 100 mmol/L (ref 96–106)
Creatinine, Ser: 0.82 mg/dL (ref 0.57–1.00)
Globulin, Total: 3.5 g/dL (ref 1.5–4.5)
Glucose: 151 mg/dL — ABNORMAL HIGH (ref 70–99)
Potassium: 4.2 mmol/L (ref 3.5–5.2)
Sodium: 141 mmol/L (ref 134–144)
Total Protein: 7.4 g/dL (ref 6.0–8.5)
eGFR: 85 mL/min/{1.73_m2} (ref >59)

## 2021-09-23 LAB — HEMOGLOBIN A1C
Est. average glucose Bld gHb Est-mCnc: 166 mg/dL
Hgb A1c MFr Bld: 7.4 % — ABNORMAL HIGH (ref 4.8–5.6)

## 2021-09-23 NOTE — Progress Notes (Signed)
Please let patient know that her lab work looks good. Her A1c is 7.4 which is improved from prior.  Cholesterol is slightly elevated.  Recommend working on a low fat diet.  We will continue to check at future visits.  Please let me know if you have any questions.

## 2021-10-11 ENCOUNTER — Ambulatory Visit (INDEPENDENT_AMBULATORY_CARE_PROVIDER_SITE_OTHER): Payer: BC Managed Care – PPO | Admitting: Physician Assistant

## 2021-10-11 ENCOUNTER — Ambulatory Visit: Payer: Self-pay | Admitting: *Deleted

## 2021-10-11 ENCOUNTER — Encounter: Payer: Self-pay | Admitting: Physician Assistant

## 2021-10-11 VITALS — BP 127/84 | HR 94 | Temp 97.9°F

## 2021-10-11 DIAGNOSIS — R110 Nausea: Secondary | ICD-10-CM

## 2021-10-11 DIAGNOSIS — K529 Noninfective gastroenteritis and colitis, unspecified: Secondary | ICD-10-CM

## 2021-10-11 MED ORDER — ONDANSETRON HCL 4 MG PO TABS: 4.0000 mg | ORAL_TABLET | Freq: Three times a day (TID) | ORAL | 0 refills | Status: DC | PRN

## 2021-10-11 NOTE — Progress Notes (Signed)
? ? ?  ?    Acute Office Visit ? ? ?Patient: Yesenia Stevens   DOB: [birth year]   56 y.o. Female  MRN: [id number] ?Visit Date: 10/11/2021 ? ?Today's healthcare provider: Dani Gobble Lyndy Russman, PA-C  ?Introduced myself to the patient as a Journalist, newspaper and provided education on APPs in clinical practice.  ? ? ?Chief Complaint  ?Patient presents with  ? Abdominal Pain  ?  Pt states she has been having abdominal pain and diarrhea for the last 2 weeks. States she has not really been able to keep anything down.   ? ?Subjective  ?  ?HPI ?HPI   ? ? Abdominal Pain   ? Additional comments: Pt states she has been having abdominal pain and diarrhea for the last 2 weeks. States she has not really been able to keep anything down.  ? ?  ?  ?Last edited by Georgina Peer, CMA on 10/11/2021  3:34 PM.  ?  ?  ? ?States she is having stomach pains and is unable to "keep anything on my stomach" ?States this has been going on for 2 weeks ?States she initially thought this was caused by her transition from rehab to home life but it has gotten worse ? ?She reports loose, watery stools, several times per day.  ?She reports normal coloration, denies blood when wiping ?States she has a dull pain in her stomach that becomes worse when she consumes food- pain is constant ?Pain is 7/10 - achey  ?Denies sensation of burning, radiation, pressure with pain when eating ? ?Reports she got home from rehab around the last week of March ?Denies recent sick contacts ?Aggravating: eating and drinking  ?Alleviating: nothing  ?She states she has not tried anything except not eating or drinking  ?She is not checking her blood glucose levels at home  ? ? ?Reports she has had something similar to this before but GI did not find anything ? ? ?Medications: ?Outpatient Medications Prior to Visit  ?Medication Sig  ? albuterol (PROVENTIL HFA;VENTOLIN HFA) 108 (90 Base) MCG/ACT inhaler Inhale 1-2 puffs into the lungs every 6 (six) hours as needed for wheezing or shortness of  breath.  ? amLODipine (NORVASC) 10 MG tablet Take 1 tablet (10 mg total) by mouth daily.  ? diclofenac sodium (VOLTAREN) 1 % GEL Apply 2 g topically 4 (four) times daily.  ? empagliflozin (JARDIANCE) 10 MG TABS tablet Take 1 tablet (10 mg total) by mouth daily before breakfast.  ? glipiZIDE (GLUCOTROL) 10 MG tablet Take 1 tablet (10 mg total) by mouth 2 (two) times daily before a meal.  ? hydrochlorothiazide (HYDRODIURIL) 25 MG tablet Take 1 tablet (25 mg total) by mouth daily.  ? ibuprofen (ADVIL) 600 MG tablet Take 1 tablet (600 mg total) by mouth every 6 (six) hours as needed.  ? metFORMIN (GLUCOPHAGE) 1000 MG tablet Take 1 tablet (1,000 mg total) by mouth 2 (two) times daily with a meal.  ? methocarbamol (ROBAXIN) 750 MG tablet Take 1 tablet (750 mg total) by mouth every 8 (eight) hours as needed for muscle spasms.  ? [DISCONTINUED] baclofen (LIORESAL) 10 MG tablet Take 1 tablet (10 mg total) by mouth 3 (three) times daily.  ? [DISCONTINUED] oxyCODONE (OXY IR/ROXICODONE) 5 MG immediate release tablet Take by mouth.  ? ?No facility-administered medications prior to visit.  ? ?Past Surgical History:  ?Procedure Laterality Date  ? CESAREAN SECTION    ? CHOLECYSTECTOMY    ? COLONOSCOPY WITH PROPOFOL N/A 12/10/2019  ?  Procedure: COLONOSCOPY WITH PROPOFOL;  Surgeon: Wyline Mood, MD;  Location[facility] ENDOSCOPY;  Service: Gastroenterology;  Laterality: N/A;  ? COLONOSCOPY WITH PROPOFOL N/A 12/24/2019  ? Procedure: COLONOSCOPY WITH PROPOFOL;  Surgeon: Wyline Mood, MD;  Location[facility] ENDOSCOPY;  Service: Gastroenterology;  Laterality: N/A;  ? FRACTURE SURGERY    ?  ? ?Review of Systems  ?Constitutional:  Negative for chills, diaphoresis, fatigue and fever.  ?Cardiovascular:  Negative for chest pain and palpitations.  ?Gastrointestinal:  Positive for abdominal pain (RUQ and LUQ pains with eating), diarrhea and nausea. Negative for abdominal distention, anal bleeding, blood in stool, rectal pain and vomiting.  ?Skin:   Negative for rash.  ?Neurological:  Negative for dizziness, tremors, weakness, light-headedness and headaches.  ? ? ?  Objective  ?  ?BP 127/84   Pulse 94   Temp 97.9 ?F (36.6 ?C) (Oral)   LMP 08/20/2015 (Approximate) Comment: denies preg  SpO2 96%  ? ? ?Physical Exam ?Vitals reviewed.  ?Constitutional:   ?   General: She is awake.  ?   Appearance: Normal appearance. She is well-developed and well-groomed. She is obese.  ?HENT:  ?   Head: Normocephalic and atraumatic.  ?Cardiovascular:  ?   Rate and Rhythm: Normal rate and regular rhythm.  ?   Heart sounds: Heart sounds are distant.  ?Pulmonary:  ?   Effort: Pulmonary effort is normal.  ?   Breath sounds: Normal breath sounds.  ?Abdominal:  ?   General: Abdomen is protuberant. Bowel sounds are decreased. There is no distension.  ?   Palpations: Abdomen is soft.  ?   Tenderness: There is no abdominal tenderness.  ?Neurological:  ?   Mental Status: She is alert.  ?Psychiatric:     ?   Attention and Perception: Attention normal.     ?   Mood and Affect: Mood normal. Affect is flat.     ?   Speech: Speech normal.     ?   Behavior: Behavior is withdrawn. Behavior is cooperative.  ?  ? ? ? ? ?No results found for any visits on 10/11/21. ? Assessment & Plan  ?  ? ?Problem List Items Addressed This Visit   ?None ?Visit Diagnoses   ? ? Gastroenteritis    -  Primary ?Acute, new problem ?Ongoing for approx 2 weeks with nausea, diarrhea, abdominal pain  ?Patient states this corresponds with exit from Rehab and symptoms are not improving ?At this time, suspect infectious gastroenteritis  ?Recommend symptomatic therapy at this time - increased hydration efforts, small meals and snacks with bland diet ?Will provide Ondansetron 4 mg PO TID PRN for nausea to assist with hydration ?Recommend using OTC Pepto Bismol and/or Imodium to assist with diarrhea symptoms ?Discussed return / ED precautions with patient ?Follow up as needed for persisting or worsening symptoms  ?  ? Nausea      ?Acute, new problem ?Likely associated with gastroenteritis  ?Will provide Ondansetron 4 mg PO TID PRN for nausea to assist with hydration and eating efforts ?Recommend she consume small, bland meals and snacks throughout the day to assist with nausea ?Follow up as needed for persistent or worsening symptoms   ? Relevant Medications  ? ondansetron (ZOFRAN) 4 MG tablet  ? ?  ? ? ? ?No follow-ups on file. ? ? ?I, Truong Delcastillo E Zakariyah Freimark, PA-C, have reviewed all documentation for this visit. The documentation on 10/11/21 for the exam, diagnosis, procedures, and orders are all accurate and complete. ? ? ?Denny Peon  Colette Dicamillo, MHS, PA-C ?[facility] ?[facility]  ? ? ?No follow-ups on file.  ?   ? ? ? ? ?

## 2021-10-11 NOTE — Telephone Encounter (Signed)
?  Chief Complaint: Diarrhea, abdominal pain ?Symptoms: Mid, upper abdominal pain, intermittent, prior to Bm. Watery stools x 2 weeks, intermittently, nausea, no vomiting. States "Everything goes right through me." ?Frequency: 2 weeks, off and on. ?Pertinent Negatives: Patient denies fever, pain does not radiate, dizziness ?Disposition: [] ED /[] Urgent Care (no appt availability in office) / [x] Appointment(In office/virtual)/ []  [facility]/ [] Home Care/ [] Refused Recommended Disposition /[]  Mobile Bus/ []  Follow-up with PCP ?Additional Notes:  Appt secured for tomorrow, requesting  to be "Fit in" today if possible, called during practice lunch break. Care advise given, advised to hydrate, ED for worsening symptoms, no urine in 12 hours, darker urine, dizziness. Verbalizes understanding.  ?Reason for Disposition ? [1] MODERATE pain (e.g., interferes with normal activities) AND [2] comes and goes (cramps) AND [3] present > 24 hours  (Exception: pain with Vomiting or Diarrhea - see that Guideline) ? ?Answer Assessment - Initial Assessment Questions ?1. LOCATION: "Where does it hurt?"  ?    Mid, top of stomach ?2. RADIATION: "Does the pain shoot anywhere else?" (e.g., chest, back) ?    No ?3. ONSET: "When did the pain begin?" (e.g., minutes, hours or days ago)  ?    2 weeks ?4. SUDDEN: "Gradual or sudden onset?" ?    Gradually ?5. PATTERN "Does the pain come and go, or is it constant?" ?   - If constant: "Is it getting better, staying the same, or worsening?"  ?    (Note: Constant means the pain never goes away completely; most serious pain is constant and it progresses)  ?   - If intermittent: "How long does it last?" "Do you have pain now?" ?    (Note: Intermittent means the pain goes away completely between bouts) ?    After eating ?6. SEVERITY: "How bad is the pain?"  (e.g., Scale 1-10; mild, moderate, or severe) ?   - MILD (1-3): doesn't interfere with normal activities, abdomen soft and not  tender to touch  ?   - MODERATE (4-7): interferes with normal activities or awakens from sleep, abdomen tender to touch  ?   - SEVERE (8-10): excruciating pain, doubled over, unable to do any normal activities   ?    5-6/10 ?7. RECURRENT SYMPTOM: "Have you ever had this type of stomach pain before?" If Yes, ask: "When was the last time?" and "What happened that time?"  ?    no ?8. AGGRAVATING FACTORS: "Does anything seem to cause this pain?" (e.g., foods, stress, alcohol) ?    *No Answer* ?9. CARDIAC SYMPTOMS: "Do you have any of the following symptoms: chest pain, difficulty breathing, sweating, nausea?" ?    *No Answer* ?10. OTHER SYMPTOMS: "Do you have any other symptoms?" (e.g., back pain, diarrhea, fever, urination pain, vomiting) ?     Diarrhea, nausea ? ?Protocols used: Abdominal Pain - Upper-A-AH ? ?

## 2021-10-11 NOTE — Patient Instructions (Addendum)
Today we discussed your concerns for ongoing diarrhea ?Diarrhea is usually self-limited and can have many causes ?The central tenants of management include the following: ?Staying well hydrated - this includes increasing water intake and supplementing with Pedialyte, Gatorade, etc. ?Try to eat a bland diet- this includes boiled chicken, brown rice, bananas, applesauce, plain toast, etc. As you are feeling better you can usually begin to slowly reincorporate your normal dietary choices back into your dietary consumption ?Using Imodium and Pepto Bismol as needed and according to manufacturers instructions to provide assistance with managing symptoms.  ? ?I have sent in a script for Zofran 4 mg to be taken as needed every 8 hours for nausea  ? ?Diarrhea with the following will need to be monitored and may require further testing: bloody diarrhea, fever, weight loss, signs of dehydration, weakness, fatigue, incontinence, diarrhea lasting longer than 1-3 weeks  ? ?Please let us know if these measures aren't leading to improvement in the the next week or so.  ? ? ?

## 2021-10-12 ENCOUNTER — Ambulatory Visit: Payer: Self-pay | Admitting: Nurse Practitioner

## 2021-10-28 ENCOUNTER — Encounter: Payer: Self-pay | Admitting: Nurse Practitioner

## 2021-10-28 ENCOUNTER — Ambulatory Visit (INDEPENDENT_AMBULATORY_CARE_PROVIDER_SITE_OTHER): Payer: BC Managed Care – PPO | Admitting: Nurse Practitioner

## 2021-10-28 VITALS — BP 155/93 | HR 105 | Temp 98.3°F | Wt 285.2 lb

## 2021-10-28 DIAGNOSIS — I10 Essential (primary) hypertension: Secondary | ICD-10-CM

## 2021-10-28 MED ORDER — VALSARTAN 80 MG PO TABS: 80.0000 mg | ORAL_TABLET | Freq: Every day | ORAL | 1 refills | Status: DC

## 2021-10-28 NOTE — Assessment & Plan Note (Signed)
Chronic.  Controlled.  Continue with current medication regimen of Amlodipine 10mg  and HCTZ 25mg .  Will add Valsartan 80mg  daily.  Side effects and benefits of medications discussed during visit.  Return to clinic in 1 month for reevaluation.  Call sooner if concerns arise.  ? ?

## 2021-10-28 NOTE — Progress Notes (Signed)
? ?BP (!) 155/93   Pulse (!) 105   Temp 98.3 ?F (36.8 ?C) (Oral)   Wt 285 lb 3.2 oz (129.4 kg)   LMP 08/20/2015 (Approximate) Comment: denies preg  SpO2 93%   BMI 48.95 kg/m?   ? ?Subjective:  ? ? Patient ID: Yesenia Stevens, female    DOB: 1965-11-26, 56 y.o.   MRN: [id number] ? ?HPI: ?Yesenia Stevens is a 56 y.o. female ? ?Chief Complaint  ?Patient presents with  ? Hypertension  ?  Follow up   ? ?HYPERTENSION ?Hypertension status: uncontrolled  ?Satisfied with current treatment? no ?Duration of hypertension: years ?BP monitoring frequency:  not checking ?BP range:  ?BP medication side effects:  no ?Medication compliance: excellent compliance ?Previous BP meds:amlodipine and HCTZ ?Aspirin: no ?Recurrent headaches: no ?Visual changes: no ?Palpitations: no ?Dyspnea: no ?Chest pain: no ?Lower extremity edema: no ?Dizzy/lightheaded: no ? ? ?Relevant past medical, surgical, family and social history reviewed and updated as indicated. Interim medical history since our last visit reviewed. ?Allergies and medications reviewed and updated. ? ?Review of Systems  ?Eyes:  Negative for visual disturbance.  ?Respiratory:  Negative for cough, chest tightness and shortness of breath.   ?Cardiovascular:  Negative for chest pain, palpitations and leg swelling.  ?Neurological:  Negative for dizziness and headaches.  ? ?Per HPI unless specifically indicated above ? ?   ?Objective:  ?  ?BP (!) 155/93   Pulse (!) 105   Temp 98.3 ?F (36.8 ?C) (Oral)   Wt 285 lb 3.2 oz (129.4 kg)   LMP 08/20/2015 (Approximate) Comment: denies preg  SpO2 93%   BMI 48.95 kg/m?   ?Wt Readings from Last 3 Encounters:  ?10/28/21 285 lb 3.2 oz (129.4 kg)  ?09/22/21 295 lb 6.4 oz (134 kg)  ?09/17/21 (!) 302 lb (137 kg)  ?  ?Physical Exam ?Vitals and nursing note reviewed.  ?Constitutional:   ?   General: She is not in acute distress. ?   Appearance: Normal appearance. She is obese. She is not ill-appearing, toxic-appearing or diaphoretic.  ?HENT:   ?   Head: Normocephalic.  ?   Right Ear: External ear normal.  ?   Left Ear: External ear normal.  ?   Nose: Nose normal.  ?   Mouth/Throat:  ?   Mouth: Mucous membranes are moist.  ?   Pharynx: Oropharynx is clear.  ?Eyes:  ?   General:     ?   Right eye: No discharge.     ?   Left eye: No discharge.  ?   Extraocular Movements: Extraocular movements intact.  ?   Conjunctiva/sclera: Conjunctivae normal.  ?   Pupils: Pupils are equal, round, and reactive to light.  ?Cardiovascular:  ?   Rate and Rhythm: Normal rate and regular rhythm.  ?   Heart sounds: No murmur heard. ?Pulmonary:  ?   Effort: Pulmonary effort is normal. No respiratory distress.  ?   Breath sounds: Normal breath sounds. No wheezing or rales.  ?Musculoskeletal:  ?   Cervical back: Normal range of motion and neck supple.  ?Skin: ?   General: Skin is warm and dry.  ?   Capillary Refill: Capillary refill takes less than 2 seconds.  ?Neurological:  ?   General: No focal deficit present.  ?   Mental Status: She is alert and oriented to person, place, and time. Mental status is at baseline.  ?Psychiatric:     ?   Mood and Affect: Mood normal.     ?  Behavior: Behavior normal.     ?   Thought Content: Thought content normal.     ?   Judgment: Judgment normal.  ? ? ?Results for orders placed or performed in visit on 09/22/21  ?Comp Met (CMET)  ?Result Value Ref Range  ? Glucose 151 (H) 70 - 99 mg/dL  ? BUN 11 6 - 24 mg/dL  ? Creatinine, Ser 0.82 0.57 - 1.00 mg/dL  ? eGFR 85 >59 mL/min/1.73  ? BUN/Creatinine Ratio 13 9 - 23  ? Sodium 141 134 - 144 mmol/L  ? Potassium 4.2 3.5 - 5.2 mmol/L  ? Chloride 100 96 - 106 mmol/L  ? CO2 23 20 - 29 mmol/L  ? Calcium 9.3 8.7 - 10.2 mg/dL  ? Total Protein 7.4 6.0 - 8.5 g/dL  ? Albumin 3.9 3.8 - 4.9 g/dL  ? Globulin, Total 3.5 1.5 - 4.5 g/dL  ? Albumin/Globulin Ratio 1.1 (L) 1.2 - 2.2  ? Bilirubin Total 0.3 0.0 - 1.2 mg/dL  ? Alkaline Phosphatase 96 44 - 121 IU/L  ? AST 22 0 - 40 IU/L  ? ALT 17 0 - 32 IU/L  ?HgB A1c   ?Result Value Ref Range  ? Hgb A1c MFr Bld 7.4 (H) 4.8 - 5.6 %  ? Est. average glucose Bld gHb Est-mCnc 166 mg/dL  ?Lipid Profile  ?Result Value Ref Range  ? Cholesterol, Total 180 100 - 199 mg/dL  ? Triglycerides 92 0 - 149 mg/dL  ? HDL 49 >39 mg/dL  ? VLDL Cholesterol Cal 17 5 - 40 mg/dL  ? LDL Chol Calc (NIH) 114 (H) 0 - 99 mg/dL  ? Chol/HDL Ratio 3.7 0.0 - 4.4 ratio  ? ?   ?Assessment & Plan:  ? ?Problem List Items Addressed This Visit   ? ?  ? Cardiovascular and Mediastinum  ? Hypertension - Primary  ?  Chronic.  Controlled.  Continue with current medication regimen of Amlodipine 58m and HCTZ 27m  Will add Valsartan 8091maily.  Side effects and benefits of medications discussed during visit.  Return to clinic in 1 month for reevaluation.  Call sooner if concerns arise.  ? ? ?  ?  ? Relevant Medications  ? valsartan (DIOVAN) 80 MG tablet  ?  ? ?Follow up plan: ?Return in about 1 month (around 11/28/2021) for BP Check. ? ? ? ? ? ?

## 2021-11-25 ENCOUNTER — Encounter: Payer: Self-pay | Admitting: Physical Therapy

## 2021-11-25 ENCOUNTER — Ambulatory Visit: Payer: BC Managed Care – PPO | Attending: Trauma Surgery | Admitting: Physical Therapy

## 2021-11-25 DIAGNOSIS — M25552 Pain in left hip: Secondary | ICD-10-CM | POA: Diagnosis present

## 2021-11-25 DIAGNOSIS — R269 Unspecified abnormalities of gait and mobility: Secondary | ICD-10-CM | POA: Insufficient documentation

## 2021-11-25 DIAGNOSIS — M25652 Stiffness of left hip, not elsewhere classified: Secondary | ICD-10-CM | POA: Insufficient documentation

## 2021-11-25 DIAGNOSIS — R2689 Other abnormalities of gait and mobility: Secondary | ICD-10-CM | POA: Insufficient documentation

## 2021-11-25 DIAGNOSIS — M6281 Muscle weakness (generalized): Secondary | ICD-10-CM | POA: Insufficient documentation

## 2021-11-25 DIAGNOSIS — M5459 Other low back pain: Secondary | ICD-10-CM | POA: Diagnosis present
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
## 2021-11-29 ENCOUNTER — Ambulatory Visit (INDEPENDENT_AMBULATORY_CARE_PROVIDER_SITE_OTHER): Payer: BC Managed Care – PPO | Admitting: Nurse Practitioner

## 2021-11-29 ENCOUNTER — Encounter: Payer: Self-pay | Admitting: Nurse Practitioner

## 2021-11-29 VITALS — BP 137/81 | HR 83 | Temp 98.7°F | Wt 286.6 lb

## 2021-11-29 DIAGNOSIS — I10 Essential (primary) hypertension: Secondary | ICD-10-CM

## 2021-11-29 NOTE — Assessment & Plan Note (Signed)
Chronic.  Controlled.  Continue with current medication regimen of Amlodipine, HCTZ and Valsartan daily.  Labs ordered today.  Return to clinic in 3 months for reevaluation.  Call sooner if concerns arise.

## 2021-11-29 NOTE — Progress Notes (Signed)
BP 137/81   Pulse 83   Temp 98.7 F (37.1 C) (Oral)   Wt 286 lb 9.6 oz (130 kg)   LMP 08/20/2015 (Approximate) Comment: denies preg  SpO2 95%   BMI 49.19 kg/m    Subjective:    Patient ID: Yesenia Stevens, female    DOB: 1966-04-10, 56 y.o.   MRN: [id number]  HPI: Yesenia Stevens is a 56 y.o. female  Chief Complaint  Patient presents with   Hypertension    1 month follow up    HYPERTENSION Hypertension status: controlled Satisfied with current treatment? no Duration of hypertension: years BP monitoring frequency:  not checking BP range:  BP medication side effects:  no Medication compliance: excellent compliance Previous BP meds:amlodipine and HCTZ and valsartan  Aspirin: no Recurrent headaches: no Visual changes: no Palpitations: no Dyspnea: no Chest pain: no Lower extremity edema: no Dizzy/lightheaded: no   Relevant past medical, surgical, family and social history reviewed and updated as indicated. Interim medical history since our last visit reviewed. Allergies and medications reviewed and updated.  Review of Systems  Eyes:  Negative for visual disturbance.  Respiratory:  Negative for cough, chest tightness and shortness of breath.   Cardiovascular:  Negative for chest pain, palpitations and leg swelling.  Neurological:  Negative for dizziness and headaches.    Per HPI unless specifically indicated above     Objective:    BP 137/81   Pulse 83   Temp 98.7 F (37.1 C) (Oral)   Wt 286 lb 9.6 oz (130 kg)   LMP 08/20/2015 (Approximate) Comment: denies preg  SpO2 95%   BMI 49.19 kg/m   Wt Readings from Last 3 Encounters:  11/29/21 286 lb 9.6 oz (130 kg)  10/28/21 285 lb 3.2 oz (129.4 kg)  09/22/21 295 lb 6.4 oz (134 kg)    Physical Exam Vitals and nursing note reviewed.  Constitutional:      General: She is not in acute distress.    Appearance: Normal appearance. She is obese. She is not ill-appearing, toxic-appearing or diaphoretic.   HENT:     Head: Normocephalic.     Right Ear: External ear normal.     Left Ear: External ear normal.     Nose: Nose normal.     Mouth/Throat:     Mouth: Mucous membranes are moist.     Pharynx: Oropharynx is clear.  Eyes:     General:        Right eye: No discharge.        Left eye: No discharge.     Extraocular Movements: Extraocular movements intact.     Conjunctiva/sclera: Conjunctivae normal.     Pupils: Pupils are equal, round, and reactive to light.  Cardiovascular:     Rate and Rhythm: Normal rate and regular rhythm.     Heart sounds: No murmur heard. Pulmonary:     Effort: Pulmonary effort is normal. No respiratory distress.     Breath sounds: Normal breath sounds. No wheezing or rales.  Musculoskeletal:     Cervical back: Normal range of motion and neck supple.  Skin:    General: Skin is warm and dry.     Capillary Refill: Capillary refill takes less than 2 seconds.  Neurological:     General: No focal deficit present.     Mental Status: She is alert and oriented to person, place, and time. Mental status is at baseline.  Psychiatric:        Mood and Affect: Mood  normal.        Behavior: Behavior normal.        Thought Content: Thought content normal.        Judgment: Judgment normal.     Results for orders placed or performed in visit on 09/22/21  Comp Met (CMET)  Result Value Ref Range   Glucose 151 (H) 70 - 99 mg/dL   BUN 11 6 - 24 mg/dL   Creatinine, Ser 0.82 0.57 - 1.00 mg/dL   eGFR 85 >59 mL/min/1.73   BUN/Creatinine Ratio 13 9 - 23   Sodium 141 134 - 144 mmol/L   Potassium 4.2 3.5 - 5.2 mmol/L   Chloride 100 96 - 106 mmol/L   CO2 23 20 - 29 mmol/L   Calcium 9.3 8.7 - 10.2 mg/dL   Total Protein 7.4 6.0 - 8.5 g/dL   Albumin 3.9 3.8 - 4.9 g/dL   Globulin, Total 3.5 1.5 - 4.5 g/dL   Albumin/Globulin Ratio 1.1 (L) 1.2 - 2.2   Bilirubin Total 0.3 0.0 - 1.2 mg/dL   Alkaline Phosphatase 96 44 - 121 IU/L   AST 22 0 - 40 IU/L   ALT 17 0 - 32 IU/L  HgB  A1c  Result Value Ref Range   Hgb A1c MFr Bld 7.4 (H) 4.8 - 5.6 %   Est. average glucose Bld gHb Est-mCnc 166 mg/dL  Lipid Profile  Result Value Ref Range   Cholesterol, Total 180 100 - 199 mg/dL   Triglycerides 92 0 - 149 mg/dL   HDL 49 >39 mg/dL   VLDL Cholesterol Cal 17 5 - 40 mg/dL   LDL Chol Calc (NIH) 114 (H) 0 - 99 mg/dL   Chol/HDL Ratio 3.7 0.0 - 4.4 ratio      Assessment & Plan:   Problem List Items Addressed This Visit       Cardiovascular and Mediastinum   Hypertension - Primary    Chronic.  Controlled.  Continue with current medication regimen of Amlodipine, HCTZ and Valsartan daily.  Labs ordered today.  Return to clinic in 3 months for reevaluation.  Call sooner if concerns arise.          Follow up plan: Return in about 3 months (around 03/01/2022) for HTN, HLD, DM2 FU.

## 2021-11-30 ENCOUNTER — Ambulatory Visit: Payer: BC Managed Care – PPO | Admitting: Physical Therapy

## 2021-11-30 DIAGNOSIS — M25652 Stiffness of left hip, not elsewhere classified: Secondary | ICD-10-CM | POA: Diagnosis not present

## 2021-11-30 DIAGNOSIS — M6281 Muscle weakness (generalized): Secondary | ICD-10-CM

## 2021-11-30 DIAGNOSIS — R2689 Other abnormalities of gait and mobility: Secondary | ICD-10-CM

## 2021-11-30 DIAGNOSIS — R269 Unspecified abnormalities of gait and mobility: Secondary | ICD-10-CM

## 2021-11-30 DIAGNOSIS — M25552 Pain in left hip: Secondary | ICD-10-CM

## 2021-11-30 DIAGNOSIS — M5459 Other low back pain: Secondary | ICD-10-CM

## 2021-11-30 NOTE — Therapy (Addendum)
OUTPATIENT PHYSICAL THERAPY THORACOLUMBAR TREATMENT   Patient Name: Yesenia Stevens MRN: [id number] DOB:[birth year], 56 y.o., female Today's Date: 11/30/2021   PT End of Session - 11/30/21 1238     Visit Number 2    Number of Visits 17    Date for PT Re-Evaluation 01/20/22    PT Start Time 0948    PT Stop Time 1056    PT Time Calculation (min) 68 min    Equipment Utilized During Treatment Gait belt    Activity Tolerance Patient limited by pain;Patient limited by fatigue    Behavior During Therapy [facility] for tasks assessed/performed             Past Medical History:  Diagnosis Date   Diabetes mellitus without complication (HCC)    Hypertension    Past Surgical History:  Procedure Laterality Date   CESAREAN SECTION     CHOLECYSTECTOMY     COLONOSCOPY WITH PROPOFOL N/A 12/10/2019   Procedure: COLONOSCOPY WITH PROPOFOL;  Surgeon: Wyline Mood, MD;  Location: [facility] ENDOSCOPY;  Service: Gastroenterology;  Laterality: N/A;   COLONOSCOPY WITH PROPOFOL N/A 12/24/2019   Procedure: COLONOSCOPY WITH PROPOFOL;  Surgeon: Wyline Mood, MD;  Location: [facility] ENDOSCOPY;  Service: Gastroenterology;  Laterality: N/A;   FRACTURE SURGERY     Patient Active Problem List   Diagnosis Date Noted   Hyperlipidemia associated with type 2 diabetes mellitus (HCC) 09/22/2021   Palpitations 03/19/2020   Diarrhea 03/19/2020   Premature atrial contractions 08/20/2018   Morbid (severe) obesity due to excess calories (HCC) 08/01/2018   Shortness of breath 07/26/2018   Chronic cough 03/22/2018   Diabetes mellitus without complication (HCC) 03/22/2018   Hypertension 03/22/2018    PCP: Larae Grooms, NP   REFERRING PROVIDER: Ernst Bowler, MD  REFERRING DIAG:  Diagnosis  S32.15XD (ICD-10-CM) - Type 2 fracture of sacrum, subsequent encounter for fracture with routine healing  S32.422D (ICD-10-CM) - Displaced fracture of posterior wall of left acetabulum, subsequent encounter for fracture with  routine healing    Rationale for Evaluation and Treatment Rehabilitation  THERAPY DIAG:  Stiffness of left hip joint  Pain in left hip  Other low back pain  Muscle weakness (generalized)  Gait difficulty  Balance problems  ONSET DATE: 05/28/21  SUBJECTIVE:                                                                                                                                                                                           SUBJECTIVE STATEMENT: Pt arrived to treatment with 0/10 pain, but took tylenol prior to intervention. Pt stated that she was sore after her evaluation, but she has  been trying to be more mobile throughout her home. Pt lives with her father, who has been assiting her during ADLs. Pt has been sleeping on her back, and has discomfort when rolling to her L side. Pt would like to wean off of her cane, and hopes to ambulate independently soon.   PERTINENT HISTORY:  Hx of L hip dislocation, and screws placed in lower back after her injury. Pt left hospital in a walker, and utilized a WC originally during [facility]. Pt has decreased ankle DF in L ankle, and pain in her lateral foot / 5th metatarsal. Pt had L knee scope, and has tenderness/bruising in L LE along tibia.  PAIN:  Are you having pain? Yes: NPRS scale: 0/10 Pain location: hip, back, legs Pain description: popping and dull/achy Aggravating factors: standing, bending forward (especially left) Relieving factors: sitting down, tylenol PRN   PRECAUTIONS: Anterior hip, Posterior hip, Knee, and Fall  WEIGHT BEARING RESTRICTIONS no  FALLS:  Has patient fallen in last 6 months? Yes. Number of falls 1  LIVING ENVIRONMENT: Lives with: lives with their family Lives in: House/apartment Stairs: yes - 2 to get into home with raining Has following equipment at home: Quad cane large base and Wheelchair (manual)  OCCUPATION:  Pt is currently on long term disability. Previously worked at [employer]  prior to injury. Would like to get back to work if the company can find a position that is more stationary.  PLOF: Independent  PATIENT GOALS Pt wants to alleviate pain, become more mobile, strengthen back musculature, strengthen LEs   OBJECTIVE:   DIAGNOSTIC FINDINGS:  LUMBAR SPINE - 2-3 VIEW   COMPARISON:  MRI 09/28/2009   FINDINGS: 4-5 mm retrolisthesis L5-S1, slightly progressive since prior examination, possibly degenerative in nature. Otherwise normal lumbar lordosis. No acute fracture of the lumbar spine. Vertebral body height is preserved. Intervertebral disc space narrowing and endplate remodeling at L4-5 and L5-S1 is in keeping with mild degenerative disc disease at these levels. Paraspinal soft tissues are unremarkable. Right sacroiliac arthrodesis has been performed with 2 partially threaded screws.   IMPRESSION: No acute fracture or traumatic listhesis.   Degenerative changes L4-S1 with 5 mm retrolisthesis L5-S1.     Electronically Signed   By: Helyn Numbers M.D.   On: 09/17/2021 22:13  PATIENT SURVEYS:  FOTO 52 ; 61  SCREENING FOR RED FLAGS: Bowel or bladder incontinence: No Spinal tumors: No Cauda equina syndrome: No Compression fracture: No Abdominal aneurysm: No  COGNITION:  Overall cognitive status: Within functional limits for tasks assessed     SENSATION: Not tested - patient reports altered sensation in L LE/foot  POSTURE: rounded shoulders, forward head, and weight shift left  PALPATION: Moderate L medial lower leg tenderness with palpation  LUMBAR ROM:   Active  AROM  eval  Flexion 50% limited (compensation to R)  Extension 75% limited (pain)  Right lateral flexion   Left lateral flexion   Right rotation 50% limited  Left rotation 25% limited   (Blank rows = not tested)  LOWER EXTREMITY ROM:     PROM/AROM Right eval Left eval  Hip flexion WFL 119/ 90 deg.  Hip extension    Hip abduction [facility]  Hip adduction    Hip  internal rotation    Hip external rotation    Knee flexion WFL 106/ 99 deg.  Knee extension    Ankle dorsiflexion WFL Limited by pain  Ankle plantarflexion WFL Limited by pain  Ankle inversion  Ankle eversion     (Blank rows = not tested)  LOWER EXTREMITY MMT:    MMT Right eval Left eval  Hip flexion 4/5 3/5  Hip extension    Hip abduction 4/5 3/5  Hip adduction 4/5 4/5  Hip internal rotation 5/5 unable  Hip external rotation 5/5 3/5  Knee flexion 5/5 3/5 (pain)  Knee extension 5/5 4/5  Ankle dorsiflexion 4+/5 3/5  Ankle plantarflexion 5/5 4/5  Ankle inversion    Ankle eversion     (Blank rows = not tested)  FUNCTIONAL TESTS:  5 times sit to stand: 25.4 seconds  GAIT: Distance walked: in clinic Assistive device utilized: Quad cane small base Level of assistance: Modified independence Comments: R sided Trendelenburg.  L antalgic gait pattern with decrease stance on L with short R LE step length/ shuffling.  No arm swing. Walks with wide base of support, with mild supination of B feet. Pt had limited endurance, and significantly limited L hip flexion and DF to clear her foot during swing phase.    TX:  11/30/21:  NuStep for 10  mins, Level 5 with UE/LE involvement  Therex: Gait training in // bars - 8 x down and back with GB - 6 x forward and 2 x backwards  Agility ladder - forward stepping in each box without a cane (with GB) - down and back 2x  Side stepping in agility ladder with GB - down and back 2x  Active assisted marching in supine - 25% assistance to L LE, R LE was independent 2 x 10 B  Active assisted SLR in supine - 25% assistance to L LE, R LE was independent 2 x 10 B  Abduction of hip in supine 10 x B  Seated marching on EOB - 20 x B  Seated toe raises 20 x  Seated heel raises 20 x   PATIENT EDUCATION:  Education details: Access Code: [email or web address] Person educated: Patient Education method: Explanation, Demonstration, and Handouts Education  comprehension: verbalized understanding and returned demonstration   HOME EXERCISE PROGRAM: Access Code: [email or web address] URL: [email or web address] Date: 11/25/2021 Prepared by: Dorene Grebe  Exercises - Straight Leg Raise  - 1 x daily - 7 x weekly - 2 sets - 10 reps - Supine Heel Slide  - 1 x daily - 7 x weekly - 2 sets - 10 reps - Supine Hip Adduction Isometric with Ball  - 1 x daily - 7 x weekly - 2 sets - 10 reps - Hooklying Gluteal Sets  - 1 x daily - 7 x weekly - 2 sets - 10 reps - Seated Heel Raise  - 1 x daily - 7 x weekly - 2 sets - 10 reps - Seated Heel Toe Raises  - 1 x daily - 7 x weekly - 2 sets - 10 reps  ASSESSMENT:  CLINICAL IMPRESSION: Pt exhibits decreased L hip flexor strength, and requires 25% assistance for supine AROM interventions (SLR and marching). Pt also has a crepitus L hip joint. Pt's hip creates audible noises as it flexes, but does not increase her discomfort. Pt is eager to increase her endurance, and tolerates gait training in the agility ladder well. Pt struggled with seated hip flexion, and stated that she will focus on this more at home. Pt would prefer to avoid using her cane during gait training tx, in order to increase her independence. Pt will benefit from further skilled PT in order to strengthen her LE's, and to progress her gait mechanics.  OBJECTIVE IMPAIRMENTS Abnormal gait, decreased activity tolerance, decreased balance, decreased endurance, decreased mobility, difficulty walking, decreased ROM, decreased strength, hypomobility, impaired flexibility, impaired sensation, improper body mechanics, postural dysfunction, obesity, and pain.   ACTIVITY LIMITATIONS carrying, lifting, bending, sitting, standing, squatting, sleeping, stairs, transfers, bed mobility, dressing, and locomotion level  PARTICIPATION LIMITATIONS: cleaning, laundry, driving, shopping, community activity, occupation, and yard work  PERSONAL FACTORS Age, Education,  and Past/current experiences are also affecting patient's functional outcome.   REHAB POTENTIAL: Good  CLINICAL DECISION MAKING: Evolving/moderate complexity  EVALUATION COMPLEXITY: Moderate   GOALS: Goals reviewed with patient? Yes  SHORT TERM GOALS: Target date: 12/28/2021  Pt will ambulate through the clinic with a symmetrical gait pattern, with equal step length and decreased pain for > 100 ft.  Baseline: Asymmetrical gait - increased L step length and decreased R step length Goal status: INITIAL   LONG TERM GOALS: Target date: 01/25/2022  Pt will increase FOTO score to > 58 Baseline:  Goal status: INITIAL  2.  Pt will decrease pain level to < 2/10 during all ADLs, in order to efficiently accomplish activities with decreased pain and increased independence Baseline:  Goal status: INITIAL  3.  Pt will increase Active L knee flexion ROM to > 120 degrees  Baseline: 99, 106 passive Goal status: INITIAL    PLAN: PT FREQUENCY: 2x/week  PT DURATION: 8 weeks  PLANNED INTERVENTIONS: Therapeutic exercises, Therapeutic activity, Neuromuscular re-education, Balance training, Gait training, Patient/Family education, Joint mobilization, Stair training, Aquatic Therapy, Cryotherapy, Moist heat, and Manual therapy.  PLAN FOR NEXT SESSION: Update HEP, strengthen B LE and Hip flexion. Gait training.  Cammie McgeeMichael C Sherk, PT, DPT # [id number] Thornell SartoriusKelly Abrey Bradway, SPT 11/30/2021, 5:30 PM

## 2021-12-02 ENCOUNTER — Ambulatory Visit: Payer: BC Managed Care – PPO | Admitting: Physical Therapy

## 2021-12-02 DIAGNOSIS — R2689 Other abnormalities of gait and mobility: Secondary | ICD-10-CM

## 2021-12-02 DIAGNOSIS — M6281 Muscle weakness (generalized): Secondary | ICD-10-CM

## 2021-12-02 DIAGNOSIS — R269 Unspecified abnormalities of gait and mobility: Secondary | ICD-10-CM

## 2021-12-02 DIAGNOSIS — M25652 Stiffness of left hip, not elsewhere classified: Secondary | ICD-10-CM | POA: Diagnosis not present

## 2021-12-02 DIAGNOSIS — M5459 Other low back pain: Secondary | ICD-10-CM

## 2021-12-02 DIAGNOSIS — M25552 Pain in left hip: Secondary | ICD-10-CM

## 2021-12-02 NOTE — Therapy (Addendum)
OUTPATIENT PHYSICAL THERAPY THORACOLUMBAR TREATMENT   Patient Name: Yesenia Stevens MRN: [id number] DOB:1965/07/03, 56 y.o., female Today's Date: 12/03/2021   PT End of Session - 12/02/21 1348     Visit Number 3    Number of Visits 17    Date for PT Re-Evaluation 01/20/22    PT Start Time 1601    PT Stop Time 1648    PT Time Calculation (min) 47 min    Equipment Utilized During Treatment Gait belt    Activity Tolerance Patient limited by pain;Patient limited by fatigue    Behavior During Therapy [facility] for tasks assessed/performed              Past Medical History:  Diagnosis Date   Diabetes mellitus without complication (HCC)    Hypertension    Past Surgical History:  Procedure Laterality Date   CESAREAN SECTION     CHOLECYSTECTOMY     COLONOSCOPY WITH PROPOFOL N/A 12/10/2019   Procedure: COLONOSCOPY WITH PROPOFOL;  Surgeon: Wyline Mood, MD;  Location: [facility] ENDOSCOPY;  Service: Gastroenterology;  Laterality: N/A;   COLONOSCOPY WITH PROPOFOL N/A 12/24/2019   Procedure: COLONOSCOPY WITH PROPOFOL;  Surgeon: Wyline Mood, MD;  Location: [facility] ENDOSCOPY;  Service: Gastroenterology;  Laterality: N/A;   FRACTURE SURGERY     Patient Active Problem List   Diagnosis Date Noted   Hyperlipidemia associated with type 2 diabetes mellitus (HCC) 09/22/2021   Palpitations 03/19/2020   Diarrhea 03/19/2020   Premature atrial contractions 08/20/2018   Morbid (severe) obesity due to excess calories (HCC) 08/01/2018   Shortness of breath 07/26/2018   Chronic cough 03/22/2018   Diabetes mellitus without complication (HCC) 03/22/2018   Hypertension 03/22/2018    PCP: Larae Grooms, NP   REFERRING PROVIDER: Ernst Bowler, MD  REFERRING DIAG:  Diagnosis  S32.15XD (ICD-10-CM) - Type 2 fracture of sacrum, subsequent encounter for fracture with routine healing  S32.422D (ICD-10-CM) - Displaced fracture of posterior wall of left acetabulum, subsequent encounter for fracture with  routine healing    Rationale for Evaluation and Treatment Rehabilitation  THERAPY DIAG:  Stiffness of left hip joint  Pain in left hip  Balance problems  Gait difficulty  Other low back pain  Muscle weakness (generalized)  ONSET DATE: 05/28/21  SUBJECTIVE:                                                                                                                                                                                           SUBJECTIVE STATEMENT: Pt arrived to treatment with 0/10 pain. Yesterday pt was sore with 6/10 pain after PT tx on Thursday. Pt stayed in bed all  day yesterday, and is on her feet for the first time today. Pt lives with her father, who has been assiting her during ADLs. Pt has been sleeping on her back, and has discomfort when rolling to her L side. Pt would like to wean off of her cane, and hopes to ambulate independently soon. No falls or LOB.   PERTINENT HISTORY:  Hx of L hip dislocation, and screws placed in lower back after her injury. Pt left hospital in a walker, and utilized a WC originally during [facility]. Pt has decreased ankle DF in L ankle, and pain in her lateral foot / 5th metatarsal. Pt had L knee scope, and has tenderness/bruising in L LE along tibia.  PAIN:  Are you having pain? Yes: NPRS scale: 0/10 Pain location: hip, back, legs Pain description: popping and dull/achy Aggravating factors: standing, bending forward (especially left) Relieving factors: sitting down, tylenol PRN   PRECAUTIONS: Anterior hip, Posterior hip, Knee, and Fall  WEIGHT BEARING RESTRICTIONS no  FALLS:  Has patient fallen in last 6 months? Yes. Number of falls 1  LIVING ENVIRONMENT: Lives with: lives with their family Lives in: House/apartment Stairs: yes - 2 to get into home with raining Has following equipment at home: Quad cane large base and Wheelchair (manual)  OCCUPATION:  Pt is currently on long term disability. Previously worked at [employer] prior to injury. Would like to get back to work if the company can find a position that is more stationary.  PLOF: Independent  PATIENT GOALS Pt wants to alleviate pain, become more mobile, strengthen back musculature, strengthen LEs   OBJECTIVE:   DIAGNOSTIC FINDINGS:  LUMBAR SPINE - 2-3 VIEW   COMPARISON:  MRI 09/28/2009   FINDINGS: 4-5 mm retrolisthesis L5-S1, slightly progressive since prior examination, possibly degenerative in nature. Otherwise normal lumbar lordosis. No acute fracture of the lumbar spine. Vertebral body height is preserved. Intervertebral disc space narrowing and endplate remodeling at L4-5 and L5-S1 is in keeping with mild degenerative disc disease at these levels. Paraspinal soft tissues are unremarkable. Right sacroiliac arthrodesis has been performed with 2 partially threaded screws.   IMPRESSION: No acute fracture or traumatic listhesis.   Degenerative changes L4-S1 with 5 mm retrolisthesis L5-S1.     Electronically Signed   By: Helyn Numbers M.D.   On: 09/17/2021 22:13  PATIENT SURVEYS:  FOTO 52 ; 61  SCREENING FOR RED FLAGS: Bowel or bladder incontinence: No Spinal tumors: No Cauda equina syndrome: No Compression fracture: No Abdominal aneurysm: No  COGNITION:  Overall cognitive status: Within functional limits for tasks assessed     SENSATION: Not tested - patient reports altered sensation in L LE/foot  POSTURE: rounded shoulders, forward head, and weight shift left  PALPATION: Moderate L medial lower leg tenderness with palpation  LUMBAR ROM:   Active  AROM  eval  Flexion 50% limited (compensation to R)  Extension 75% limited (pain)  Right lateral flexion   Left lateral flexion   Right rotation 50% limited  Left rotation 25% limited   (Blank rows = not tested)  LOWER EXTREMITY ROM:     PROM/AROM Right eval Left eval  Hip flexion WFL 119/ 90 deg.  Hip extension    Hip abduction [facility]  Hip adduction     Hip internal rotation    Hip external rotation    Knee flexion WFL 106/ 99 deg.  Knee extension    Ankle dorsiflexion WFL Limited by pain  Ankle plantarflexion [facility] Limited  by pain  Ankle inversion    Ankle eversion     (Blank rows = not tested)  LOWER EXTREMITY MMT:    MMT Right eval Left eval  Hip flexion 4/5 3/5  Hip extension    Hip abduction 4/5 3/5  Hip adduction 4/5 4/5  Hip internal rotation 5/5 unable  Hip external rotation 5/5 3/5  Knee flexion 5/5 3/5 (pain)  Knee extension 5/5 4/5  Ankle dorsiflexion 4+/5 3/5  Ankle plantarflexion 5/5 4/5  Ankle inversion    Ankle eversion     (Blank rows = not tested)  FUNCTIONAL TESTS:  5 times sit to stand: 25.4 seconds  GAIT: Distance walked: in clinic Assistive device utilized: Quad cane small base Level of assistance: Modified independence Comments: R sided Trendelenburg.  L antalgic gait pattern with decrease stance on L with short R LE step length/ shuffling.  No arm swing. Walks with wide base of support, with mild supination of B feet. Pt had limited endurance, and significantly limited L hip flexion and DF to clear her foot during swing phase.    TX: 12/02/21 NuStep for 10  mins, Level 4 with LE involvement - no UE  Gait training in // bars - 8 x down and back with GB   Gait in // bars with short hurdles - down and back 8 times  Step ups onto airex pad - 10 x B  Standing weight shifting in // bars x 40   Standing marches at // bars - 10 x per LE  Seated LAQ = x 10 B - 2 sets  Seated marching 10 x B - 2 sets  Walking through clinic - 2 mins    11/30/21:  NuStep for 10  mins, Level 5 with UE/LE involvement  Therex: Gait training in // bars - 8 x down and back with GB - 6 x forward and 2 x backwards  Agility ladder - forward stepping in each box without a cane (with GB) - down and back 2x  Side stepping in agility ladder with GB - down and back 2x  Active assisted marching in supine - 25%  assistance to L LE, R LE was independent 2 x 10 B  Active assisted SLR in supine - 25% assistance to L LE, R LE was independent 2 x 10 B  Abduction of hip in supine 10 x B  Seated marching on EOB - 20 x B  Seated toe raises 20 x  Seated heel raises 20 x   PATIENT EDUCATION:  Education details: Access Code: [email or web address] Person educated: Patient Education method: Explanation, Demonstration, and Handouts Education comprehension: verbalized understanding and returned demonstration   HOME EXERCISE PROGRAM: Access Code: [email or web address] URL: [email or web address] Date: 11/25/2021 Prepared by: Dorene Grebe  Exercises - Straight Leg Raise  - 1 x daily - 7 x weekly - 2 sets - 10 reps - Supine Heel Slide  - 1 x daily - 7 x weekly - 2 sets - 10 reps - Supine Hip Adduction Isometric with Ball  - 1 x daily - 7 x weekly - 2 sets - 10 reps - Hooklying Gluteal Sets  - 1 x daily - 7 x weekly - 2 sets - 10 reps - Seated Heel Raise  - 1 x daily - 7 x weekly - 2 sets - 10 reps - Seated Heel Toe Raises  - 1 x daily - 7 x weekly - 2 sets - 10 reps  ASSESSMENT:  CLINICAL IMPRESSION: Pt exhibits  decreased L hip flexor strength. Pt also has a crepitus L hip joint. Pt's hip and knee create audible noises as it flexes, but does not increase her discomfort. Pt is eager to increase her endurance, and tolerates gait training throughout the clinic well. Pt struggled with seated hip flexion, and stated that she will focus on this more at home. Pt would prefer to avoid using her cane during gait training tx, in order to increase her independence. Pt was very tired after 10 mins on NuStep, and will benefit from further focusing on increasing her cardiovascular endurance. Pt will benefit from further skilled PT in order to strengthen her LE's, and to progress her gait mechanics.    OBJECTIVE IMPAIRMENTS Abnormal gait, decreased activity tolerance, decreased balance, decreased endurance, decreased mobility,  difficulty walking, decreased ROM, decreased strength, hypomobility, impaired flexibility, impaired sensation, improper body mechanics, postural dysfunction, obesity, and pain.   ACTIVITY LIMITATIONS carrying, lifting, bending, sitting, standing, squatting, sleeping, stairs, transfers, bed mobility, dressing, and locomotion level  PARTICIPATION LIMITATIONS: cleaning, laundry, driving, shopping, community activity, occupation, and yard work  PERSONAL FACTORS Age, Education, and Past/current experiences are also affecting patient's functional outcome.   REHAB POTENTIAL: Good  CLINICAL DECISION MAKING: Evolving/moderate complexity  EVALUATION COMPLEXITY: Moderate   GOALS: Goals reviewed with patient? Yes  SHORT TERM GOALS: Target date: 12/31/2021  Pt will ambulate through the clinic with a symmetrical gait pattern, with equal step length and decreased pain for > 100 ft.  Baseline: Asymmetrical gait - increased L step length and decreased R step length Goal status: INITIAL   LONG TERM GOALS: Target date: 01/28/2022  Pt will increase FOTO score to > 58 Baseline:  Goal status: INITIAL  2.  Pt will decrease pain level to < 2/10 during all ADLs, in order to efficiently accomplish activities with decreased pain and increased independence Baseline:  Goal status: INITIAL  3.  Pt will increase Active L knee flexion ROM to > 120 degrees  Baseline: 99, 106 passive Goal status: INITIAL    PLAN: PT FREQUENCY: 2x/week  PT DURATION: 8 weeks  PLANNED INTERVENTIONS: Therapeutic exercises, Therapeutic activity, Neuromuscular re-education, Balance training, Gait training, Patient/Family education, Joint mobilization, Stair training, Aquatic Therapy, Cryotherapy, Moist heat, and Manual therapy.  PLAN FOR NEXT SESSION: Update HEP, strengthen B LE and Hip flexion. Gait training. Endurance emphasis.   Cammie Mcgee, PT, DPT # [id number] Thornell Sartorius, SPT 12/03/2021, 1:50 PM

## 2021-12-07 ENCOUNTER — Encounter: Payer: Self-pay | Admitting: Physical Therapy

## 2021-12-09 ENCOUNTER — Ambulatory Visit: Payer: BC Managed Care – PPO | Admitting: Physical Therapy

## 2021-12-09 DIAGNOSIS — M6281 Muscle weakness (generalized): Secondary | ICD-10-CM

## 2021-12-09 DIAGNOSIS — R269 Unspecified abnormalities of gait and mobility: Secondary | ICD-10-CM

## 2021-12-09 DIAGNOSIS — M25652 Stiffness of left hip, not elsewhere classified: Secondary | ICD-10-CM

## 2021-12-09 DIAGNOSIS — M5459 Other low back pain: Secondary | ICD-10-CM

## 2021-12-09 DIAGNOSIS — R2689 Other abnormalities of gait and mobility: Secondary | ICD-10-CM

## 2021-12-09 DIAGNOSIS — M25552 Pain in left hip: Secondary | ICD-10-CM

## 2021-12-09 NOTE — Therapy (Cosign Needed)
OUTPATIENT PHYSICAL THERAPY THORACOLUMBAR TREATMENT   Patient Name: Yesenia Stevens MRN: [id number] DOB:31-Oct-1965, 56 y.o., female Today's Date: 12/09/2021   PT End of Session - 12/09/21 1836     Visit Number 4    Number of Visits 17    Date for PT Re-Evaluation 01/20/22    PT Start Time [id number]    PT Stop Time 1745    PT Time Calculation (min) 58 min    Equipment Utilized During Treatment Gait belt    Activity Tolerance Patient limited by pain;Patient limited by fatigue    Behavior During Therapy [facility] for tasks assessed/performed              Past Medical History:  Diagnosis Date   Diabetes mellitus without complication ([place])    Hypertension    Past Surgical History:  Procedure Laterality Date   CESAREAN SECTION     CHOLECYSTECTOMY     COLONOSCOPY WITH PROPOFOL N/A 12/10/2019   Procedure: COLONOSCOPY WITH PROPOFOL;  Surgeon: [name], MD;  Location: [facility] ENDOSCOPY;  Service: Gastroenterology;  Laterality: N/A;   COLONOSCOPY WITH PROPOFOL N/A 12/24/2019   Procedure: COLONOSCOPY WITH PROPOFOL;  Surgeon: [name], MD;  Location: [facility] ENDOSCOPY;  Service: Gastroenterology;  Laterality: N/A;   FRACTURE SURGERY     Patient Active Problem List   Diagnosis Date Noted   Hyperlipidemia associated with type 2 diabetes mellitus ([place]) 09/22/2021   Palpitations 03/19/2020   Diarrhea 03/19/2020   Premature atrial contractions 08/20/2018   Morbid (severe) obesity due to excess calories ([place]) 08/01/2018   Shortness of breath 07/26/2018   Chronic cough 03/22/2018   Diabetes mellitus without complication ([place]) [id number]   Hypertension 03/22/2018    PCP: Jon Billings, NP   REFERRING PROVIDER: Katherine Mantle, MD  REFERRING DIAG:  Diagnosis  S32.15XD (ICD-10-CM) - Type 2 fracture of sacrum, subsequent encounter for fracture with routine healing  S32.422D (ICD-10-CM) - Displaced fracture of posterior wall of left acetabulum, subsequent encounter for fracture with  routine healing    Rationale for Evaluation and Treatment Rehabilitation  THERAPY DIAG:  Stiffness of left hip joint  Other low back pain  Pain in left hip  Muscle weakness (generalized)  Balance problems  Gait difficulty  ONSET DATE: 05/28/21  SUBJECTIVE:                                                                                                                                                                                           SUBJECTIVE STATEMENT: Pt arrived to treatment with 0/10 pain, but was experiencing discomfort in her R anterior hip that she refers to as "pulling". Pt has  been unable to get to church lately, and has been spending more time in bed. Pt stated that she remains in bed until at least 1:00 pm most days, and does not do much activity throughout her afternoon. Pt lives with her father, who has been assiting her during ADLs. Pt has been sleeping on her back, and has discomfort when rolling to her L side. Pt is unable to roll independently, and is only able to come up onto her forearms and scoot in bed. Pt hopes to wean off of her cane, and hopes to ambulate independently as soon as possible. Pt had extensive swelling in her L ankle today, without any report of trauma to the area. Pt is also having an MRI scheduled for the firmness in her L shin, which will be scheduled prior to Monday. No falls or LOB.   PERTINENT HISTORY:  Hx of L hip dislocation, and screws placed in lower back after her injury. Pt left hospital in a walker, and utilized a WC originally during [facility]. Pt has decreased ankle DF in L ankle, and pain in her lateral foot / 5th metatarsal. Pt had L knee scope, and has tenderness/bruising in L LE along tibia.  PAIN:  Are you having pain? Yes: NPRS scale: 0/10 Pain location: hip, back, legs Pain description: popping and dull/achy Aggravating factors: standing, bending forward (especially left) Relieving factors: sitting down, tylenol  PRN   PRECAUTIONS: Anterior hip, Posterior hip, Knee, and Fall  WEIGHT BEARING RESTRICTIONS no  FALLS:  Has patient fallen in last 6 months? Yes. Number of falls 1  LIVING ENVIRONMENT: Lives with: lives with their family Lives in: House/apartment Stairs: yes - 2 to get into home with raining Has following equipment at home: Quad cane large base and Wheelchair (manual)  OCCUPATION:  Pt is currently on long term disability. Previously worked at [employer] prior to injury. Would like to get back to work if the company can find a position that is more stationary.  PLOF: Independent  PATIENT GOALS Pt wants to alleviate pain, become more mobile, strengthen back musculature, strengthen LEs   OBJECTIVE:   DIAGNOSTIC FINDINGS:  LUMBAR SPINE - 2-3 VIEW   COMPARISON:  MRI 09/28/2009   FINDINGS: 4-5 mm retrolisthesis L5-S1, slightly progressive since prior examination, possibly degenerative in nature. Otherwise normal lumbar lordosis. No acute fracture of the lumbar spine. Vertebral body height is preserved. Intervertebral disc space narrowing and endplate remodeling at L4-5 and L5-S1 is in keeping with mild degenerative disc disease at these levels. Paraspinal soft tissues are unremarkable. Right sacroiliac arthrodesis has been performed with 2 partially threaded screws.   IMPRESSION: No acute fracture or traumatic listhesis.   Degenerative changes L4-S1 with 5 mm retrolisthesis L5-S1.     Electronically Signed   By: Helyn Numbers M.D.   On: 09/17/2021 22:13  PATIENT SURVEYS:  FOTO 52 ; 61  SCREENING FOR RED FLAGS: Bowel or bladder incontinence: No Spinal tumors: No Cauda equina syndrome: No Compression fracture: No Abdominal aneurysm: No  COGNITION:  Overall cognitive status: Within functional limits for tasks assessed     SENSATION: Not tested - patient reports altered sensation in L LE/foot  POSTURE: rounded shoulders, forward head, and weight shift  left  PALPATION: Moderate L medial lower leg tenderness with palpation  LUMBAR ROM:   Active  AROM  eval  Flexion 50% limited (compensation to R)  Extension 75% limited (pain)  Right lateral flexion   Left lateral flexion   Right  rotation 50% limited  Left rotation 25% limited   (Blank rows = not tested)  LOWER EXTREMITY ROM:     PROM/AROM Right eval Left eval  Hip flexion WFL 119/ 90 deg.  Hip extension    Hip abduction [facility]  Hip adduction    Hip internal rotation    Hip external rotation    Knee flexion WFL 106/ 99 deg.  Knee extension    Ankle dorsiflexion WFL Limited by pain  Ankle plantarflexion WFL Limited by pain  Ankle inversion    Ankle eversion     (Blank rows = not tested)  LOWER EXTREMITY MMT:    MMT Right eval Left eval  Hip flexion 4/5 3/5  Hip extension    Hip abduction 4/5 3/5  Hip adduction 4/5 4/5  Hip internal rotation 5/5 unable  Hip external rotation 5/5 3/5  Knee flexion 5/5 3/5 (pain)  Knee extension 5/5 4/5  Ankle dorsiflexion 4+/5 3/5  Ankle plantarflexion 5/5 4/5  Ankle inversion    Ankle eversion     (Blank rows = not tested)  FUNCTIONAL TESTS:  5 times sit to stand: 25.4 seconds  GAIT: Distance walked: in clinic Assistive device utilized: Quad cane small base Level of assistance: Modified independence Comments: R sided Trendelenburg.  L antalgic gait pattern with decrease stance on L with short R LE step length/ shuffling.  No arm swing. Walks with wide base of support, with mild supination of B feet. Pt had limited endurance, and significantly limited L hip flexion and DF to clear her foot during swing phase.    TX:  12/09/21:  Pt did not wish to utilize the NuStep today  Circumferential measurements made to L and R ankle -  58 cm on L ; 52 cm on R  There ex:  Gait training in // bars - 8 x down and back with GB   Agility ladder - forward stepping in each box without a cane (with GB) - down and back  8x  Standing weight shifting in // bars x 20  Supine clamshells 60 x with green TB / 40 x with blue TB   Supine SLR 10 x B  Supine marching 20 x B  Supine marching with feet elevated onto white ball 20 x B  Supine heel slides 20 x per LE   Max A x 1 bed mobility rolling to R side  Manual Therapy:  Lymphatic drainage technique to R ankle with massage cream   PROM to L LE into hip flexion and knee flexion  - 5 mins  Ice to L ankle - 10 mins untimed - elevated in long sitting    12/02/21 NuStep for 10  mins, Level 4 with LE involvement - no UE  Gait training in // bars - 8 x down and back with GB   Gait in // bars with short hurdles - down and back 8 times  Step ups onto airex pad - 10 x B  Standing weight shifting in // bars x 40   Standing marches at // bars - 10 x per LE  Seated LAQ = x 10 B - 2 sets  Seated marching 10 x B - 2 sets  Walking through clinic - 2 mins    PATIENT EDUCATION:  Education details: Access Code: [email or web address] Person educated: Patient Education method: Explanation, Demonstration, and Handouts Education comprehension: verbalized understanding and returned demonstration   HOME EXERCISE PROGRAM: Access Code: [email or web address] URL: [email or web address] Date: 11/25/2021 Prepared  by: Dorcas Carrow  Exercises - Straight Leg Raise  - 1 x daily - 7 x weekly - 2 sets - 10 reps - Supine Heel Slide  - 1 x daily - 7 x weekly - 2 sets - 10 reps - Supine Hip Adduction Isometric with Ball  - 1 x daily - 7 x weekly - 2 sets - 10 reps - Hooklying Gluteal Sets  - 1 x daily - 7 x weekly - 2 sets - 10 reps - Seated Heel Raise  - 1 x daily - 7 x weekly - 2 sets - 10 reps - Seated Heel Toe Raises  - 1 x daily - 7 x weekly - 2 sets - 10 reps  ASSESSMENT:  CLINICAL IMPRESSION: Pt exhibits decreased L hip flexor strength and decreased L glute med strength. Pt also has a crepitus L hip joint. Pt's hip and knee create audible noises as they flex, but  they do not increase her discomfort. Pt is eager to increase her endurance, and tolerates gait training throughout the clinic. Pt had extensive swelling of L ankle, which was not painful or warm to palpation. Pt would prefer to avoid using her cane during gait training tx, in order to increase her independence. Pt was unable to roll to her R side independently on tx table, and required 1x max A to roll into side lying, and to roll back into supine position. Pt will benefit from further skilled PT in order to strengthen her LE's, and to progress her gait mechanics. Pt will also benefit from a therapeutic emphasis on bed mobility.    OBJECTIVE IMPAIRMENTS Abnormal gait, decreased activity tolerance, decreased balance, decreased endurance, decreased mobility, difficulty walking, decreased ROM, decreased strength, hypomobility, impaired flexibility, impaired sensation, improper body mechanics, postural dysfunction, obesity, and pain.   ACTIVITY LIMITATIONS carrying, lifting, bending, sitting, standing, squatting, sleeping, stairs, transfers, bed mobility, dressing, and locomotion level  PARTICIPATION LIMITATIONS: cleaning, laundry, driving, shopping, community activity, occupation, and yard work  PERSONAL FACTORS Age, Education, and Past/current experiences are also affecting patient's functional outcome.   REHAB POTENTIAL: Good  CLINICAL DECISION MAKING: Evolving/moderate complexity  EVALUATION COMPLEXITY: Moderate   GOALS: Goals reviewed with patient? Yes  SHORT TERM GOALS: Target date: 01/06/2022  Pt will ambulate through the clinic with a symmetrical gait pattern, with equal step length and decreased pain for > 100 ft.  Baseline: Asymmetrical gait - increased L step length and decreased R step length Goal status: INITIAL   LONG TERM GOALS: Target date: 02/03/2022  Pt will increase FOTO score to > 58 Baseline:  Goal status: INITIAL  2.  Pt will decrease pain level to < 2/10 during all  ADLs, in order to efficiently accomplish activities with decreased pain and increased independence Baseline:  Goal status: INITIAL  3.  Pt will increase Active L knee flexion ROM to > 120 degrees  Baseline: 99, 106 passive Goal status: INITIAL    PLAN: PT FREQUENCY: 2x/week  PT DURATION: 8 weeks  PLANNED INTERVENTIONS: Therapeutic exercises, Therapeutic activity, Neuromuscular re-education, Balance training, Gait training, Patient/Family education, Joint mobilization, Stair training, Aquatic Therapy, Cryotherapy, Moist heat, and Manual therapy.  PLAN FOR NEXT SESSION: Update HEP, strengthen B LE and Hip flexion. Gait training. Endurance emphasis. BED MOBILITY.  Pura Spice, PT, DPT # [id number] Andee Lineman, SPT 12/09/2021, 6:40 PM

## 2021-12-14 ENCOUNTER — Ambulatory Visit: Payer: BC Managed Care – PPO | Admitting: Physical Therapy

## 2021-12-14 DIAGNOSIS — M25652 Stiffness of left hip, not elsewhere classified: Secondary | ICD-10-CM

## 2021-12-14 DIAGNOSIS — R269 Unspecified abnormalities of gait and mobility: Secondary | ICD-10-CM

## 2021-12-14 DIAGNOSIS — M25552 Pain in left hip: Secondary | ICD-10-CM

## 2021-12-14 DIAGNOSIS — M5459 Other low back pain: Secondary | ICD-10-CM

## 2021-12-14 DIAGNOSIS — M6281 Muscle weakness (generalized): Secondary | ICD-10-CM

## 2021-12-14 DIAGNOSIS — R2689 Other abnormalities of gait and mobility: Secondary | ICD-10-CM

## 2021-12-16 ENCOUNTER — Encounter: Payer: Self-pay | Admitting: Physical Therapy

## 2021-12-16 ENCOUNTER — Ambulatory Visit: Payer: BC Managed Care – PPO | Admitting: Physical Therapy

## 2021-12-16 DIAGNOSIS — M25552 Pain in left hip: Secondary | ICD-10-CM

## 2021-12-16 DIAGNOSIS — M5459 Other low back pain: Secondary | ICD-10-CM

## 2021-12-16 DIAGNOSIS — R2689 Other abnormalities of gait and mobility: Secondary | ICD-10-CM

## 2021-12-16 DIAGNOSIS — M6281 Muscle weakness (generalized): Secondary | ICD-10-CM

## 2021-12-16 DIAGNOSIS — M25652 Stiffness of left hip, not elsewhere classified: Secondary | ICD-10-CM

## 2021-12-16 DIAGNOSIS — R269 Unspecified abnormalities of gait and mobility: Secondary | ICD-10-CM

## 2021-12-16 NOTE — Therapy (Addendum)
OUTPATIENT PHYSICAL THERAPY THORACOLUMBAR TREATMENT   Patient Name: Yesenia Stevens MRN: [id number] DOB:12-Jan-1966, 56 y.o., female Today's Date: 12/16/2021   PT End of Session - 12/16/21 1523     Visit Number 6    Number of Visits 17    Date for PT Re-Evaluation 01/20/22    PT Start Time 1037    PT Stop Time 1125    PT Time Calculation (min) 48 min    Equipment Utilized During Treatment Gait belt    Activity Tolerance Patient limited by pain;Patient limited by fatigue    Behavior During Therapy [facility] for tasks assessed/performed               Past Medical History:  Diagnosis Date   Diabetes mellitus without complication (HCC)    Hypertension    Past Surgical History:  Procedure Laterality Date   CESAREAN SECTION     CHOLECYSTECTOMY     COLONOSCOPY WITH PROPOFOL N/A 12/10/2019   Procedure: COLONOSCOPY WITH PROPOFOL;  Surgeon: Wyline Mood, MD;  Location: [facility] ENDOSCOPY;  Service: Gastroenterology;  Laterality: N/A;   COLONOSCOPY WITH PROPOFOL N/A 12/24/2019   Procedure: COLONOSCOPY WITH PROPOFOL;  Surgeon: Wyline Mood, MD;  Location: [facility] ENDOSCOPY;  Service: Gastroenterology;  Laterality: N/A;   FRACTURE SURGERY     Patient Active Problem List   Diagnosis Date Noted   Hyperlipidemia associated with type 2 diabetes mellitus (HCC) 09/22/2021   Palpitations 03/19/2020   Diarrhea 03/19/2020   Premature atrial contractions 08/20/2018   Morbid (severe) obesity due to excess calories (HCC) 08/01/2018   Shortness of breath 07/26/2018   Chronic cough 03/22/2018   Diabetes mellitus without complication (HCC) 03/22/2018   Hypertension 03/22/2018    PCP: Larae Grooms, NP   REFERRING PROVIDER: Ernst Bowler, MD  REFERRING DIAG:  Diagnosis  S32.15XD (ICD-10-CM) - Type 2 fracture of sacrum, subsequent encounter for fracture with routine healing  S32.422D (ICD-10-CM) - Displaced fracture of posterior wall of left acetabulum, subsequent encounter for fracture with  routine healing    Rationale for Evaluation and Treatment Rehabilitation  THERAPY DIAG:  Stiffness of left hip joint  Other low back pain  Muscle weakness (generalized)  Balance problems  Pain in left hip  Gait difficulty  ONSET DATE: 05/28/21  SUBJECTIVE:                                                                                                                                                                                           SUBJECTIVE STATEMENT: Pt arrived to treatment with 0/10 pain, but consistently feels pulling in her L anterior hip. Pt lives with her father, who has  been assiting her during ADLs, and she recently walked from her kitchen to her bedroom without her walker in the home, which she estimates to be about 30 ft. Pt is heading to a birthday party this weekend, and is eager to walk for a prolonged period of time (with [facility]).  PERTINENT HISTORY:  Hx of L hip dislocation, and screws placed in lower back after her injury. Pt left hospital in a walker, and utilized a WC originally during [facility]. Pt has decreased ankle DF in L ankle, and pain in her lateral foot / 5th metatarsal. Pt had L knee scope, and has tenderness/bruising in L LE along tibia.  PAIN:  Are you having pain? Yes: NPRS scale: 0/10 Pain location: hip, back, legs Pain description: popping and dull/achy Aggravating factors: standing, bending forward (especially left) Relieving factors: sitting down, tylenol PRN   PRECAUTIONS: Anterior hip, Posterior hip, Knee, and Fall  WEIGHT BEARING RESTRICTIONS no  FALLS:  Has patient fallen in last 6 months? Yes. Number of falls 1  LIVING ENVIRONMENT: Lives with: lives with their family Lives in: House/apartment Stairs: yes - 2 to get into home with raining Has following equipment at home: Quad cane large base and Wheelchair (manual)  OCCUPATION:  Pt is currently on long term disability. Previously worked at [employer] prior to injury. Would  like to get back to work if the company can find a position that is more stationary.  PLOF: Independent  PATIENT GOALS Pt wants to alleviate pain, become more mobile, strengthen back musculature, strengthen LEs   OBJECTIVE:   DIAGNOSTIC FINDINGS:  LUMBAR SPINE - 2-3 VIEW   COMPARISON:  MRI 09/28/2009   FINDINGS: 4-5 mm retrolisthesis L5-S1, slightly progressive since prior examination, possibly degenerative in nature. Otherwise normal lumbar lordosis. No acute fracture of the lumbar spine. Vertebral body height is preserved. Intervertebral disc space narrowing and endplate remodeling at 075-GRM and L5-S1 is in keeping with mild degenerative disc disease at these levels. Paraspinal soft tissues are unremarkable. Right sacroiliac arthrodesis has been performed with 2 partially threaded screws.   IMPRESSION: No acute fracture or traumatic listhesis.   Degenerative changes L4-S1 with 5 mm retrolisthesis L5-S1.     Electronically Signed   By: Fidela Salisbury M.D.   On: 09/17/2021 22:13  PATIENT SURVEYS:  FOTO 52 ; 61  SCREENING FOR RED FLAGS: Bowel or bladder incontinence: No Spinal tumors: No Cauda equina syndrome: No Compression fracture: No Abdominal aneurysm: No  COGNITION:  Overall cognitive status: Within functional limits for tasks assessed     SENSATION: Not tested - patient reports altered sensation in L LE/foot  POSTURE: rounded shoulders, forward head, and weight shift left  PALPATION: Moderate L medial lower leg tenderness with palpation  LUMBAR ROM:   Active  AROM  eval  Flexion 50% limited (compensation to R)  Extension 75% limited (pain)  Right lateral flexion   Left lateral flexion   Right rotation 50% limited  Left rotation 25% limited   (Blank rows = not tested)  LOWER EXTREMITY ROM:     PROM/AROM Right eval Left eval  Hip flexion WFL 119/ 90 deg.  Hip extension    Hip abduction [facility]  Hip adduction    Hip internal rotation     Hip external rotation    Knee flexion WFL 106/ 99 deg.  Knee extension    Ankle dorsiflexion WFL Limited by pain  Ankle plantarflexion WFL Limited by pain  Ankle inversion    Ankle  eversion     (Blank rows = not tested)  LOWER EXTREMITY MMT:    MMT Right eval Left eval  Hip flexion 4/5 3/5  Hip extension    Hip abduction 4/5 3/5  Hip adduction 4/5 4/5  Hip internal rotation 5/5 unable  Hip external rotation 5/5 3/5  Knee flexion 5/5 3/5 (pain)  Knee extension 5/5 4/5  Ankle dorsiflexion 4+/5 3/5  Ankle plantarflexion 5/5 4/5  Ankle inversion    Ankle eversion     (Blank rows = not tested)  FUNCTIONAL TESTS:  5 times sit to stand: 25.4 seconds  GAIT: Distance walked: in clinic Assistive device utilized: Quad cane small base Level of assistance: Modified independence Comments: R sided Trendelenburg.  L antalgic gait pattern with decrease stance on L with short R LE step length/ shuffling.  No arm swing. Walks with wide base of support, with mild supination of B feet. Pt had limited endurance, and significantly limited L hip flexion and DF to clear her foot during swing phase.    TX: 12/16/21: NuStep for 13 minutes - level 5 to 0.31 mi  There ex: Standing squats in // bars - 20 x  Standing lunges in // bars - 10 x per LE   High knees in // bars - down and back 6 x  Glute-kicks in // bars - down and back 6 x  Walking at agility ladder ; step-to gait pattern with step length equivalent to the length of boxes in the ladder.   Seated marching with 3# ankle weight - 40 x B ; followed by 20 x B  Seated LAQ with 3# ankle weight - 15 x B - 2 sets  Seated hip flexion with straight leg with 3# ankle weight - 15 X b - 2 sets   12/14/21:  Supine on blue mat table with red wedge under back for support: SLR 10 x B  Heel slides with circular slide pad under heels 10 x B - 3 sets  Bridging 10 x 2  Bridging with legs extended and elevated onto red bolster  Walking  in // bars - down and back 4 x  Seated marching 20 x B  Seated LAQ 20 x B  Standing squats in // bars - 20 x  Standing lunges in // bars - 10 x per LE   Pendulum swing of L hip/LE - pain limited L LE, 10 x on R LE  Heel raises 20 x B in // bars  Ended with NuStep L 5 - to 0.3 miles which took 12:41   PATIENT EDUCATION:  Education details: Access Code: [email or web address] Person educated: Patient Education method: Explanation, Demonstration, and Handouts Education comprehension: verbalized understanding and returned demonstration   HOME EXERCISE PROGRAM: Access Code: [email or web address] URL: [email or web address] Date: 11/25/2021 Prepared by: Dorcas Carrow  Exercises - Straight Leg Raise  - 1 x daily - 7 x weekly - 2 sets - 10 reps - Supine Heel Slide  - 1 x daily - 7 x weekly - 2 sets - 10 reps - Supine Hip Adduction Isometric with Ball  - 1 x daily - 7 x weekly - 2 sets - 10 reps - Hooklying Gluteal Sets  - 1 x daily - 7 x weekly - 2 sets - 10 reps - Seated Heel Raise  - 1 x daily - 7 x weekly - 2 sets - 10 reps - Seated Heel Toe Raises  - 1 x daily - 7 x weekly - 2  sets - 10 reps  ASSESSMENT:  CLINICAL IMPRESSION: Pt exhibits decreased L hip flexor strength and decreased L glute med strength. Pt demonstrates trendelenburg throughout ambulation in the clinic, with the dropping of R hip (due to L glute med weakness). Pt increased her Nustep level from 4 to 5, and was fatigued from the change in resistance. Pt rides for at least 0.3 mi per session. Pt will benefit from further skilled PT in order to strengthen her LE's, and to progress her gait mechanics.   OBJECTIVE IMPAIRMENTS Abnormal gait, decreased activity tolerance, decreased balance, decreased endurance, decreased mobility, difficulty walking, decreased ROM, decreased strength, hypomobility, impaired flexibility, impaired sensation, improper body mechanics, postural dysfunction, obesity, and pain.   ACTIVITY LIMITATIONS  carrying, lifting, bending, sitting, standing, squatting, sleeping, stairs, transfers, bed mobility, dressing, and locomotion level  PARTICIPATION LIMITATIONS: cleaning, laundry, driving, shopping, community activity, occupation, and yard work  PERSONAL FACTORS Age, Education, and Past/current experiences are also affecting patient's functional outcome.   REHAB POTENTIAL: Good  CLINICAL DECISION MAKING: Evolving/moderate complexity  EVALUATION COMPLEXITY: Moderate   GOALS: Goals reviewed with patient? Yes  SHORT TERM GOALS: Target date: 01/13/2022  Pt will ambulate through the clinic with a symmetrical gait pattern, with equal step length and decreased pain for > 100 ft.  Baseline: Asymmetrical gait - increased L step length and decreased R step length Goal status: INITIAL   LONG TERM GOALS: Target date: 02/10/2022  Pt will increase FOTO score to > 58 Baseline:  Goal status: INITIAL  2.  Pt will decrease pain level to < 2/10 during all ADLs, in order to efficiently accomplish activities with decreased pain and increased independence Baseline:  Goal status: INITIAL  3.  Pt will increase Active L knee flexion ROM to > 120 degrees  Baseline: 99, 106 passive Goal status: INITIAL    PLAN: PT FREQUENCY: 2x/week  PT DURATION: 8 weeks  PLANNED INTERVENTIONS: Therapeutic exercises, Therapeutic activity, Neuromuscular re-education, Balance training, Gait training, Patient/Family education, Joint mobilization, Stair training, Aquatic Therapy, Cryotherapy, Moist heat, and Manual therapy.  PLAN FOR NEXT SESSION: Update HEP, strengthen B LE and Hip flexion. Gait training. Endurance emphasis.   Cammie Mcgee, PT, DPT # [id number] Thornell Sartorius, SPT 12/16/2021, 4:37 PM

## 2021-12-17 ENCOUNTER — Ambulatory Visit
Admission: EM | Admit: 2021-12-17 | Discharge: 2021-12-17 | Disposition: A | Discharge status: Home / Self Care | Payer: BC Managed Care – PPO | Attending: Emergency Medicine | Admitting: Emergency Medicine

## 2021-12-17 ENCOUNTER — Encounter: Payer: Self-pay | Admitting: Emergency Medicine

## 2021-12-17 DIAGNOSIS — H5789 Other specified disorders of eye and adnexa: Secondary | ICD-10-CM

## 2021-12-17 MED ORDER — POLYMYXIN B-TRIMETHOPRIM 10000-0.1 UNIT/ML-% OP SOLN: 1.0000 [drp] | OPHTHALMIC | 0 refills | Status: DC

## 2021-12-17 NOTE — Discharge Instructions (Addendum)
Today in office he has completed eye washing to help rinse the solution from your eyes  You have been placed on antibiotics prophylactically, Place 1 drop into the eye 4 times daily (every 8 hours) for the next 7 days  You may use Tylenol 500 to 1000 mg every 6 hours and/or ibuprofen 800 mg every 8 hours for management of pain  You may place cool compresses over the eye in 10 to 15-minute intervals for general comfort  If your symptoms continue to persist she will need to follow-up with ophthalmology which is the eye specialist for further evaluation  At any point if you begin to have visual changes please go to the nearest emergency department for further evaluation and management

## 2021-12-17 NOTE — ED Triage Notes (Signed)
Patient states that she was putting a solution on her scalp and it ran into her left around 3 pm today.  Patient c/o pain and discomfort in her left eye.

## 2021-12-20 ENCOUNTER — Ambulatory Visit: Payer: BC Managed Care – PPO | Attending: Trauma Surgery | Admitting: Physical Therapy

## 2021-12-20 DIAGNOSIS — M25552 Pain in left hip: Secondary | ICD-10-CM | POA: Diagnosis present

## 2021-12-20 DIAGNOSIS — M25652 Stiffness of left hip, not elsewhere classified: Secondary | ICD-10-CM | POA: Insufficient documentation

## 2021-12-20 DIAGNOSIS — M6281 Muscle weakness (generalized): Secondary | ICD-10-CM | POA: Diagnosis present

## 2021-12-20 DIAGNOSIS — R269 Unspecified abnormalities of gait and mobility: Secondary | ICD-10-CM | POA: Insufficient documentation

## 2021-12-20 DIAGNOSIS — R2689 Other abnormalities of gait and mobility: Secondary | ICD-10-CM | POA: Diagnosis present

## 2021-12-20 DIAGNOSIS — M5459 Other low back pain: Secondary | ICD-10-CM | POA: Insufficient documentation

## 2021-12-20 NOTE — Therapy (Cosign Needed)
OUTPATIENT PHYSICAL THERAPY THORACOLUMBAR TREATMENT   Patient Name: Yesenia Stevens MRN: [id number] DOB:January 20, 1966, 56 y.o., female Today's Date: 12/20/2021   PT End of Session - 12/20/21 1507     Visit Number 7    Number of Visits 17    Date for PT Re-Evaluation 01/20/22    PT Start Time 1116    PT Stop Time 1202    PT Time Calculation (min) 46 min    Equipment Utilized During Treatment Gait belt    Activity Tolerance Patient limited by pain;Patient limited by fatigue    Behavior During Therapy [facility] for tasks assessed/performed               Past Medical History:  Diagnosis Date   Diabetes mellitus without complication (HCC)    Hypertension    Past Surgical History:  Procedure Laterality Date   CESAREAN SECTION     CHOLECYSTECTOMY     COLONOSCOPY WITH PROPOFOL N/A 12/10/2019   Procedure: COLONOSCOPY WITH PROPOFOL;  Surgeon: Wyline Mood, MD;  Location: [facility] ENDOSCOPY;  Service: Gastroenterology;  Laterality: N/A;   COLONOSCOPY WITH PROPOFOL N/A 12/24/2019   Procedure: COLONOSCOPY WITH PROPOFOL;  Surgeon: Wyline Mood, MD;  Location: [facility] ENDOSCOPY;  Service: Gastroenterology;  Laterality: N/A;   FRACTURE SURGERY     Patient Active Problem List   Diagnosis Date Noted   Hyperlipidemia associated with type 2 diabetes mellitus (HCC) 09/22/2021   Palpitations 03/19/2020   Diarrhea 03/19/2020   Premature atrial contractions 08/20/2018   Morbid (severe) obesity due to excess calories (HCC) 08/01/2018   Shortness of breath 07/26/2018   Chronic cough 03/22/2018   Diabetes mellitus without complication (HCC) 03/22/2018   Hypertension 03/22/2018    PCP: Larae Grooms, NP   REFERRING PROVIDER: Ernst Bowler, MD  REFERRING DIAG:  Diagnosis  S32.15XD (ICD-10-CM) - Type 2 fracture of sacrum, subsequent encounter for fracture with routine healing  S32.422D (ICD-10-CM) - Displaced fracture of posterior wall of left acetabulum, subsequent encounter for fracture with  routine healing    Rationale for Evaluation and Treatment Rehabilitation  THERAPY DIAG:  Stiffness of left hip joint  Pain in left hip  Other low back pain  Gait difficulty  Muscle weakness (generalized)  Balance problems  ONSET DATE: 05/28/21  SUBJECTIVE:                                                                                                                                                                                           SUBJECTIVE STATEMENT: Pt presented with 0/10 pain in knee or hip, but her L ankle was swollen from being on her feet so much this  past weekend. Pt lives with her father, who has been assiting her during ADLs. Pt exhibited decreased endurance while ambulating through clinic today, and required extra rest breaks throughout today's tx session. Pt stated that she was able to roll onto her R side in bed for the first time this past weekend, which allowed for pressure relief from her back.   PERTINENT HISTORY:  Hx of L hip dislocation, and screws placed in lower back after her injury. Pt left hospital in a walker, and utilized a WC originally during [facility]. Pt has decreased ankle DF in L ankle, and pain in her lateral foot / 5th metatarsal. Pt had L knee scope, and has tenderness/bruising in L LE along tibia.  PAIN:  Are you having pain? NPRS scale: 0/10 Pain location: hip, back, legs Pain description: popping and dull/achy Aggravating factors: standing, bending forward (especially left) Relieving factors: sitting down, tylenol PRN   PRECAUTIONS: Anterior hip, Posterior hip, Knee, and Fall  WEIGHT BEARING RESTRICTIONS no  FALLS:  Has patient fallen in last 6 months? Yes. Number of falls 1  LIVING ENVIRONMENT: Lives with: lives with their family Lives in: House/apartment Stairs: yes - 2 to get into home with raining Has following equipment at home: Quad cane large base and Wheelchair (manual)  OCCUPATION:  Pt is currently on long term  disability. Previously worked at [employer] prior to injury. Would like to get back to work if the company can find a position that is more stationary.  PLOF: Independent  PATIENT GOALS Pt wants to alleviate pain, become more mobile, strengthen back musculature, strengthen LEs   OBJECTIVE:   DIAGNOSTIC FINDINGS:  LUMBAR SPINE - 2-3 VIEW   COMPARISON:  MRI 09/28/2009   FINDINGS: 4-5 mm retrolisthesis L5-S1, slightly progressive since prior examination, possibly degenerative in nature. Otherwise normal lumbar lordosis. No acute fracture of the lumbar spine. Vertebral body height is preserved. Intervertebral disc space narrowing and endplate remodeling at L4-5 and L5-S1 is in keeping with mild degenerative disc disease at these levels. Paraspinal soft tissues are unremarkable. Right sacroiliac arthrodesis has been performed with 2 partially threaded screws.   IMPRESSION: No acute fracture or traumatic listhesis.   Degenerative changes L4-S1 with 5 mm retrolisthesis L5-S1.     Electronically Signed   By: Helyn Numbers M.D.   On: 09/17/2021 22:13  PATIENT SURVEYS:  FOTO 52 ; 61  SCREENING FOR RED FLAGS: Bowel or bladder incontinence: No Spinal tumors: No Cauda equina syndrome: No Compression fracture: No Abdominal aneurysm: No  COGNITION:  Overall cognitive status: Within functional limits for tasks assessed     SENSATION: Not tested - patient reports altered sensation in L LE/foot  POSTURE: rounded shoulders, forward head, and weight shift left  PALPATION: Moderate L medial lower leg tenderness with palpation  LUMBAR ROM:   Active  AROM  eval  Flexion 50% limited (compensation to R)  Extension 75% limited (pain)  Right lateral flexion   Left lateral flexion   Right rotation 50% limited  Left rotation 25% limited   (Blank rows = not tested)  LOWER EXTREMITY ROM:     PROM/AROM Right eval Left eval  Hip flexion WFL 119/ 90 deg.  Hip extension     Hip abduction [facility]  Hip adduction    Hip internal rotation    Hip external rotation    Knee flexion WFL 106/ 99 deg.  Knee extension    Ankle dorsiflexion WFL Limited by pain  Ankle plantarflexion [facility]  Limited by pain  Ankle inversion    Ankle eversion     (Blank rows = not tested)  LOWER EXTREMITY MMT:    MMT Right eval Left eval  Hip flexion 4/5 3/5  Hip extension    Hip abduction 4/5 3/5  Hip adduction 4/5 4/5  Hip internal rotation 5/5 unable  Hip external rotation 5/5 3/5  Knee flexion 5/5 3/5 (pain)  Knee extension 5/5 4/5  Ankle dorsiflexion 4+/5 3/5  Ankle plantarflexion 5/5 4/5  Ankle inversion    Ankle eversion     (Blank rows = not tested)  FUNCTIONAL TESTS:  5 times sit to stand: 25.4 seconds  GAIT: Distance walked: in clinic Assistive device utilized: Quad cane small base Level of assistance: Modified independence Comments: R sided Trendelenburg.  L antalgic gait pattern with decrease stance on L with short R LE step length/ shuffling.  No arm swing. Walks with wide base of support, with mild supination of B feet. Pt had limited endurance, and significantly limited L hip flexion and DF to clear her foot during swing phase.    TX: 12/20/21:  Nustep Level 4 - 13 minutes with LE - 0.30 mi  There ex:  Supine on blue mat table with red wedge under back for support:  SLR 10 x B - 3 sets  SAQ over green bolster - 20 x B - 2 sets  Supine bridging x 10 - 2 sets ; 1 prolonged bridge for 8 seconds   Bed mobility - rolling independently to her R side - side lying for 4 minutes for pressure relief   L hip abduction in R sidelying - 10 x 2 sets - active assist with mod/max assist of LLE  Marching in // bars - down and back x 6  Side stepping over 6" hurdle - 9 x B LE in // bars  Forward stepping over 6" hurdle - 9 x B LE in // bars   12/16/21: NuStep for 13 minutes - level 5 to 0.31 mi  There ex: Standing squats in // bars - 20 x  Standing  lunges in // bars - 10 x per LE   High knees in // bars - down and back 6 x  Glute-kicks in // bars - down and back 6 x  Walking at agility ladder ; step-to gait pattern with step length equivalent to the length of boxes in the ladder.   Seated marching with 3# ankle weight - 40 x B ; followed by 20 x B  Seated LAQ with 3# ankle weight - 15 x B - 2 sets  Seated hip flexion with straight leg with 3# ankle weight - 15 X b - 2 sets  PATIENT EDUCATION:  Education details: Access Code: [email or web address] Person educated: Patient Education method: Explanation, Demonstration, and Handouts Education comprehension: verbalized understanding and returned demonstration   HOME EXERCISE PROGRAM: Access Code: [email or web address] URL: [email or web address] Date: 11/25/2021 Prepared by: Dorene Grebe  Exercises - Straight Leg Raise  - 1 x daily - 7 x weekly - 2 sets - 10 reps - Supine Heel Slide  - 1 x daily - 7 x weekly - 2 sets - 10 reps - Supine Hip Adduction Isometric with Ball  - 1 x daily - 7 x weekly - 2 sets - 10 reps - Hooklying Gluteal Sets  - 1 x daily - 7 x weekly - 2 sets - 10 reps - Seated Heel Raise  - 1 x daily - 7 x  weekly - 2 sets - 10 reps - Seated Heel Toe Raises  - 1 x daily - 7 x weekly - 2 sets - 10 reps  ASSESSMENT:  CLINICAL IMPRESSION: Pt exhibited decreased L hip flexor strength and decreased L glute med strength. Pt required moderate to max assist with AAROM hip abduction in side lying, but stated that her body felt immense pressure relief when lying on her R side. Pt demonstrated trendelenburg throughout ambulation in the clinic, with the dropping of R hip (due to L glute med weakness). Pt appeared to have an increased limp today, with a wider base of support during ambulation than prior tx sessions. Pt rode for 0.3 mi on the NuStep with only the use of her LE's. Pt will benefit from further skilled PT in order to strengthen her LE's, and to progress her gait mechanics.    OBJECTIVE IMPAIRMENTS Abnormal gait, decreased activity tolerance, decreased balance, decreased endurance, decreased mobility, difficulty walking, decreased ROM, decreased strength, hypomobility, impaired flexibility, impaired sensation, improper body mechanics, postural dysfunction, obesity, and pain.   ACTIVITY LIMITATIONS carrying, lifting, bending, sitting, standing, squatting, sleeping, stairs, transfers, bed mobility, dressing, and locomotion level  PARTICIPATION LIMITATIONS: cleaning, laundry, driving, shopping, community activity, occupation, and yard work  PERSONAL FACTORS Age, Education, and Past/current experiences are also affecting patient's functional outcome.   REHAB POTENTIAL: Good  CLINICAL DECISION MAKING: Evolving/moderate complexity  EVALUATION COMPLEXITY: Moderate   GOALS: Goals reviewed with patient? Yes  SHORT TERM GOALS: Target date: 01/17/2022  Pt will ambulate through the clinic with a symmetrical gait pattern, with equal step length and decreased pain for > 100 ft.  Baseline: Asymmetrical gait - increased L step length and decreased R step length Goal status: INITIAL   LONG TERM GOALS: Target date: 02/14/2022  Pt will increase FOTO score to > 58 Baseline:  Goal status: INITIAL  2.  Pt will decrease pain level to < 2/10 during all ADLs, in order to efficiently accomplish activities with decreased pain and increased independence Baseline:  Goal status: INITIAL  3.  Pt will increase Active L knee flexion ROM to > 120 degrees  Baseline: 99, 106 passive Goal status: INITIAL    PLAN: PT FREQUENCY: 2x/week  PT DURATION: 8 weeks  PLANNED INTERVENTIONS: Therapeutic exercises, Therapeutic activity, Neuromuscular re-education, Balance training, Gait training, Patient/Family education, Joint mobilization, Stair training, Aquatic Therapy, Cryotherapy, Moist heat, and Manual therapy.  PLAN FOR NEXT SESSION: Update HEP, strengthen B LE and Hip flexion.  Gait training. Endurance emphasis. VITALS.  RECHECK 5xSTS  Cammie Mcgee, PT, DPT # [id number] Thornell Sartorius, SPT 12/20/2021, 3:10 PM

## 2021-12-20 NOTE — ED Provider Notes (Signed)
[facility]    CSN: [id number] Arrival date & time: 12/17/21  1942      History   Chief Complaint Chief Complaint  Patient presents with   Eye Problem    left    HPI Yesenia Stevens is a 56 y.o. female.   Patient presents with left eye irritation and pain beginning this morning.  Endorses that she was getting her hair done when a solution ran into her eye.  Has not attempted treatment.  Denies drainage, pruritus, erythema, blurred vision or light sensitivity.   Past Medical History:  Diagnosis Date   Diabetes mellitus without complication (HCC)    Hypertension     Patient Active Problem List   Diagnosis Date Noted   Hyperlipidemia associated with type 2 diabetes mellitus (HCC) 09/22/2021   Palpitations 03/19/2020   Diarrhea 03/19/2020   Premature atrial contractions 08/20/2018   Morbid (severe) obesity due to excess calories (HCC) 08/01/2018   Shortness of breath 07/26/2018   Chronic cough 03/22/2018   Diabetes mellitus without complication (HCC) 03/22/2018   Hypertension 03/22/2018    Past Surgical History:  Procedure Laterality Date   CESAREAN SECTION     CHOLECYSTECTOMY     COLONOSCOPY WITH PROPOFOL N/A 12/10/2019   Procedure: COLONOSCOPY WITH PROPOFOL;  Surgeon: Wyline Mood, MD;  Location[facility] ENDOSCOPY;  Service: Gastroenterology;  Laterality: N/A;   COLONOSCOPY WITH PROPOFOL N/A 12/24/2019   Procedure: COLONOSCOPY WITH PROPOFOL;  Surgeon: Wyline Mood, MD;  Location: [facility] ENDOSCOPY;  Service: Gastroenterology;  Laterality: N/A;   FRACTURE SURGERY      OB History   No obstetric history on file.      Home Medications    Prior to Admission medications   Medication Sig Start Date End Date Taking? Authorizing Provider  amLODipine (NORVASC) 10 MG tablet Take 1 tablet (10 mg total) by mouth daily. 09/22/21  Yes Larae Grooms, NP  empagliflozin (JARDIANCE) 10 MG TABS tablet Take 1 tablet (10 mg total) by mouth daily before breakfast.  09/22/21  Yes Larae Grooms, NP  glipiZIDE (GLUCOTROL) 10 MG tablet Take 1 tablet (10 mg total) by mouth 2 (two) times daily before a meal. 09/22/21  Yes Larae Grooms, NP  hydrochlorothiazide (HYDRODIURIL) 25 MG tablet Take 1 tablet (25 mg total) by mouth daily. 09/22/21  Yes Larae Grooms, NP  metFORMIN (GLUCOPHAGE) 1000 MG tablet Take 1 tablet (1,000 mg total) by mouth 2 (two) times daily with a meal. 09/22/21  Yes Larae Grooms, NP  trimethoprim-polymyxin b (POLYTRIM) ophthalmic solution Place 1 drop into both eyes every 4 (four) hours. 12/17/21  Yes Tommie Bohlken R, NP  valsartan (DIOVAN) 80 MG tablet Take 1 tablet (80 mg total) by mouth daily. 10/28/21  Yes Larae Grooms, NP  albuterol (PROVENTIL HFA;VENTOLIN HFA) 108 (90 Base) MCG/ACT inhaler Inhale 1-2 puffs into the lungs every 6 (six) hours as needed for wheezing or shortness of breath. 07/19/16   Hagler, Jami L, PA-C  betamethasone, augmented, (DIPROLENE) 0.05 % lotion Apply topically 2 (two) times daily. 10/17/21   [provider]  diclofenac sodium (VOLTAREN) 1 % GEL Apply 2 g topically 4 (four) times daily. 06/25/18   Particia Nearing, PA-C  hydrocortisone 2.5 % cream SMARTSIG:sparingly Topical Twice Daily 10/14/21   [provider]  ibuprofen (ADVIL) 600 MG tablet Take 1 tablet (600 mg total) by mouth every 6 (six) hours as needed. 04/05/21   Larae Grooms, NP  methocarbamol (ROBAXIN) 750 MG tablet Take 1 tablet (750 mg total) by  mouth every 8 (eight) hours as needed for muscle spasms. 09/17/21   Lorre Munroe, NP  ondansetron (ZOFRAN) 4 MG tablet Take 1 tablet (4 mg total) by mouth every 8 (eight) hours as needed for nausea or vomiting. 10/11/21   Mecum, Oswaldo Conroy, PA-C    Family History Family History  Problem Relation Age of Onset   Hypertension Mother    Diabetes Mother     Social History Social History   Tobacco Use   Smoking status: Never   Smokeless tobacco: Never  Vaping Use   Vaping  Use: Never used  Substance Use Topics   Alcohol use: Never    Alcohol/week: 0.0 standard drinks of alcohol   Drug use: Never     Allergies   Patient has no known allergies.   Review of Systems Review of Systems  Constitutional: Negative.   Eyes:  Positive for pain. Negative for photophobia, discharge, redness, itching and visual disturbance.  Respiratory: Negative.    Skin: Negative.   Neurological: Negative.      Physical Exam Triage Vital Signs ED Triage Vitals  Enc Vitals Group     BP 12/17/21 1957 131/84     Pulse Rate 12/17/21 1957 88     Resp 12/17/21 1957 14     Temp 12/17/21 1957 98.8 F (37.1 C)     Temp Source 12/17/21 1957 Oral     SpO2 12/17/21 1957 98 %     Weight 12/17/21 1955 285 lb (129.3 kg)     Height 12/17/21 1955 5\' 4"  (1.626 m)     Head Circumference --      Peak Flow --      Pain Score 12/17/21 1955 3     Pain Loc --      Pain Edu? --      Excl. in GC? --    No data found.  Updated Vital Signs BP 131/84 (BP Location: Left Arm)   Pulse 88   Temp 98.8 F (37.1 C) (Oral)   Resp 14   Ht 5\' 4"  (1.626 m)   Wt 285 lb (129.3 kg)   LMP 08/20/2015 (Approximate) Comment: denies preg  SpO2 98%   BMI 48.92 kg/m   Visual Acuity Right Eye Distance: 20/30 corrected Left Eye Distance: 20/25 corrected Bilateral Distance: 20/25 corrected  Right Eye Near:   Left Eye Near:    Bilateral Near:     Physical Exam Constitutional:      Appearance: Normal appearance.  HENT:     Head: Normocephalic.  Eyes:     Conjunctiva/sclera: Conjunctivae normal.     Pupils: Pupils are equal, round, and reactive to light.     Comments: Left eye is without abnormality, vision is grossly intact, extraocular movements intact  Pulmonary:     Effort: Pulmonary effort is normal.  Skin:    General: Skin is warm and dry.  Neurological:     Mental Status: She is alert and oriented to person, place, and time. Mental status is at baseline.  Psychiatric:        Mood  and Affect: Mood normal.        Behavior: Behavior normal.      UC Treatments / Results  Labs (all labs ordered are listed, but only abnormal results are displayed) Labs Reviewed - No data to display  EKG   Radiology No results found.  Procedures Procedures (including critical care time)  Medications Ordered in UC Medications - No data to display  Initial Impression / Assessment and Plan / UC Course  I have reviewed the triage vital signs and the nursing notes.  Pertinent labs & imaging results that were available during my care of the patient were reviewed by me and considered in my medical decision making (see chart for details).  Irritation of left eye  Eyewash completed with 20 to 30 cc of irrigation fluid, endorses that I feel somewhat better after, placed on antibiotics prophylactically, Polytrim prescribed, may use Tylenol or ibuprofen and cool compresses for additional comfort, patient is to follow-up with her ophthalmologist if symptoms continue to persist or worsen Final Clinical Impressions(s) / UC Diagnoses   Final diagnoses:  Irritation of left eye     Discharge Instructions      Today in office he has completed eye washing to help rinse the solution from your eyes  You have been placed on antibiotics prophylactically, Place 1 drop into the eye 4 times daily (every 8 hours) for the next 7 days  You may use Tylenol 500 to 1000 mg every 6 hours and/or ibuprofen 800 mg every 8 hours for management of pain  You may place cool compresses over the eye in 10 to 15-minute intervals for general comfort  If your symptoms continue to persist she will need to follow-up with ophthalmology which is the eye specialist for further evaluation  At any point if you begin to have visual changes please go to the nearest emergency department for further evaluation and management   ED Prescriptions     Medication Sig Dispense Auth. Provider   trimethoprim-polymyxin b  (POLYTRIM) ophthalmic solution Place 1 drop into both eyes every 4 (four) hours. 10 mL Valinda Hoar, NP      PDMP not reviewed this encounter.   Valinda Hoar, [place] 12/20/21 [phone]

## 2021-12-23 ENCOUNTER — Ambulatory Visit: Payer: BC Managed Care – PPO | Admitting: Physical Therapy

## 2021-12-23 ENCOUNTER — Encounter: Payer: Self-pay | Admitting: Physical Therapy

## 2021-12-23 DIAGNOSIS — M25552 Pain in left hip: Secondary | ICD-10-CM

## 2021-12-23 DIAGNOSIS — R269 Unspecified abnormalities of gait and mobility: Secondary | ICD-10-CM

## 2021-12-23 DIAGNOSIS — M6281 Muscle weakness (generalized): Secondary | ICD-10-CM

## 2021-12-23 DIAGNOSIS — M25652 Stiffness of left hip, not elsewhere classified: Secondary | ICD-10-CM | POA: Diagnosis not present

## 2021-12-23 DIAGNOSIS — R2689 Other abnormalities of gait and mobility: Secondary | ICD-10-CM

## 2021-12-23 DIAGNOSIS — M5459 Other low back pain: Secondary | ICD-10-CM

## 2021-12-23 NOTE — Therapy (Addendum)
OUTPATIENT PHYSICAL THERAPY THORACOLUMBAR TREATMENT   Patient Name: Yesenia Stevens MRN: [id number] DOB:[birth year], 56 y.o., female Today's Date: 12/23/2021   PT End of Session - 12/23/21 1243     Visit Number 8    Number of Visits 17    Date for PT Re-Evaluation 01/20/22    PT Start Time 1114    PT Stop Time 1212    PT Time Calculation (min) 58 min    Equipment Utilized During Treatment Gait belt    Activity Tolerance Patient limited by pain;Patient limited by fatigue    Behavior During Therapy [facility] for tasks assessed/performed               Past Medical History:  Diagnosis Date   Diabetes mellitus without complication (HCC)    Hypertension    Past Surgical History:  Procedure Laterality Date   CESAREAN SECTION     CHOLECYSTECTOMY     COLONOSCOPY WITH PROPOFOL N/A 12/10/2019   Procedure: COLONOSCOPY WITH PROPOFOL;  Surgeon: Wyline Mood, MD;  Location: [facility] ENDOSCOPY;  Service: Gastroenterology;  Laterality: N/A;   COLONOSCOPY WITH PROPOFOL N/A 12/24/2019   Procedure: COLONOSCOPY WITH PROPOFOL;  Surgeon: Wyline Mood, MD;  Location: [facility] ENDOSCOPY;  Service: Gastroenterology;  Laterality: N/A;   FRACTURE SURGERY     Patient Active Problem List   Diagnosis Date Noted   Hyperlipidemia associated with type 2 diabetes mellitus (HCC) 09/22/2021   Palpitations 03/19/2020   Diarrhea 03/19/2020   Premature atrial contractions 08/20/2018   Morbid (severe) obesity due to excess calories (HCC) 08/01/2018   Shortness of breath 07/26/2018   Chronic cough 03/22/2018   Diabetes mellitus without complication (HCC) 03/22/2018   Hypertension 03/22/2018    PCP: Larae Grooms, NP   REFERRING PROVIDER: Ernst Bowler, MD  REFERRING DIAG:  Diagnosis  S32.15XD (ICD-10-CM) - Type 2 fracture of sacrum, subsequent encounter for fracture with routine healing  S32.422D (ICD-10-CM) - Displaced fracture of posterior wall of left acetabulum, subsequent encounter for fracture with  routine healing    Rationale for Evaluation and Treatment Rehabilitation  THERAPY DIAG:  Stiffness of left hip joint  Gait difficulty  Pain in left hip  Muscle weakness (generalized)  Other low back pain  Balance problems  ONSET DATE: 05/28/21  SUBJECTIVE:                                                                                                                                                                                           SUBJECTIVE STATEMENT: Pt presented with 0/10 pain in knee or hip, but she was very sore yesterday. Pt spent eysterday in bed, and had not  bene out of bed much since Tuesday. Swelling in pt's L LE decreased, and she is bale to tie both shoes without the use of compression socks.   PERTINENT HISTORY:  Hx of L hip dislocation, and screws placed in lower back after her injury. Pt left hospital in a walker, and utilized a WC originally during [facility]. Pt has decreased ankle DF in L ankle, and pain in her lateral foot / 5th metatarsal. Pt had L knee scope, and has tenderness/bruising in L LE along tibia.  PAIN:  Are you having pain? NPRS scale: 0/10 Pain location: hip, back, legs Pain description: popping and dull/achy Aggravating factors: standing, bending forward (especially left) Relieving factors: sitting down, tylenol PRN   PRECAUTIONS: Anterior hip, Posterior hip, Knee, and Fall  WEIGHT BEARING RESTRICTIONS no  FALLS:  Has patient fallen in last 6 months? Yes. Number of falls 1  LIVING ENVIRONMENT: Lives with: lives with their family Lives in: House/apartment Stairs: yes - 2 to get into home with raining Has following equipment at home: Quad cane large base and Wheelchair (manual)  OCCUPATION:  Pt is currently on long term disability. Previously worked at [employer] prior to injury. Would like to get back to work if the company can find a position that is more stationary.  PLOF: Independent  PATIENT GOALS Pt wants to alleviate  pain, become more mobile, strengthen back musculature, strengthen LEs   OBJECTIVE:   DIAGNOSTIC FINDINGS:  LUMBAR SPINE - 2-3 VIEW   COMPARISON:  MRI 09/28/2009   FINDINGS: 4-5 mm retrolisthesis L5-S1, slightly progressive since prior examination, possibly degenerative in nature. Otherwise normal lumbar lordosis. No acute fracture of the lumbar spine. Vertebral body height is preserved. Intervertebral disc space narrowing and endplate remodeling at 075-GRM and L5-S1 is in keeping with mild degenerative disc disease at these levels. Paraspinal soft tissues are unremarkable. Right sacroiliac arthrodesis has been performed with 2 partially threaded screws.   IMPRESSION: No acute fracture or traumatic listhesis.   Degenerative changes L4-S1 with 5 mm retrolisthesis L5-S1.     Electronically Signed   By: Fidela Salisbury M.D.   On: 09/17/2021 22:13  PATIENT SURVEYS:  FOTO 52 ; 61  SCREENING FOR RED FLAGS: Bowel or bladder incontinence: No Spinal tumors: No Cauda equina syndrome: No Compression fracture: No Abdominal aneurysm: No  COGNITION:  Overall cognitive status: Within functional limits for tasks assessed     SENSATION: Not tested - patient reports altered sensation in L LE/foot  POSTURE: rounded shoulders, forward head, and weight shift left  PALPATION: Moderate L medial lower leg tenderness with palpation  LUMBAR ROM:   Active  AROM  eval  Flexion 50% limited (compensation to R)  Extension 75% limited (pain)  Right lateral flexion   Left lateral flexion   Right rotation 50% limited  Left rotation 25% limited   (Blank rows = not tested)  LOWER EXTREMITY ROM:     PROM/AROM Right eval Left eval  Hip flexion WFL 119/ 90 deg.  Hip extension    Hip abduction [facility]  Hip adduction    Hip internal rotation    Hip external rotation    Knee flexion WFL 106/ 99 deg.  Knee extension    Ankle dorsiflexion WFL Limited by pain  Ankle plantarflexion WFL  Limited by pain  Ankle inversion    Ankle eversion     (Blank rows = not tested)  LOWER EXTREMITY MMT:    MMT Right eval Left eval  Hip  flexion 4/5 3/5  Hip extension    Hip abduction 4/5 3/5  Hip adduction 4/5 4/5  Hip internal rotation 5/5 unable  Hip external rotation 5/5 3/5  Knee flexion 5/5 3/5 (pain)  Knee extension 5/5 4/5  Ankle dorsiflexion 4+/5 3/5  Ankle plantarflexion 5/5 4/5  Ankle inversion    Ankle eversion     (Blank rows = not tested)  FUNCTIONAL TESTS:  5 times sit to stand: 25.4 seconds  GAIT: Distance walked: in clinic Assistive device utilized: Quad cane small base Level of assistance: Modified independence Comments: R sided Trendelenburg.  L antalgic gait pattern with decrease stance on L with short R LE step length/ shuffling.  No arm swing. Walks with wide base of support, with mild supination of B feet. Pt had limited endurance, and significantly limited L hip flexion and DF to clear her foot during swing phase.    TX:  12/23/21:  There ex:  Supine on blue mat table with red wedge under back for support:  SLR 10 x on L LE - 3 sets  Side lying on blue mat on R hip - 2 minutes for pressure relief  L Hip abduction with straight leg in R side lying - 10 x 3 sets - active assist with mod/max assist of LLE  Seated marching with 4# ankle weight - 20 x B ; 2 sets  Standing hip abduction with 4# ankle weight on R LE - no weight on L LE. 2 sets of 10 x per LE next to treadmill for tactile support  Ascending and descending stairs 6 x - 2 x with L LE leading / 2 x with R LE leading / 2 x with reciprocal gait   Manual Therapy:  Supine:  Passive iliopsoas stretch 30 s x 5 with L LE off of side of blue mat table  PROM of L hip into flexion and abduction 15 x per direction  Grade 2/3 L hip distraction - 30 s hold x 6  Grade 2/3 oscillating L hip distraction - 40 oscillations - 2 sets  STM to L iliopsoas - trigger point release with 30 s hold x  4  BP: 129/75   HR: 91 bpm  (s/p treatment session).      12/20/21:  Nustep Level 4 - 13 minutes with LE - 0.30 mi  There ex:  Supine on blue mat table with red wedge under back for support:  SLR 10 x B - 3 sets  SAQ over green bolster - 20 x B - 2 sets  Supine bridging x 10 - 2 sets ; 1 prolonged bridge for 8 seconds   Bed mobility - rolling independently to her R side - side lying for 4 minutes for pressure relief   L hip abduction in R sidelying - 10 x 2 sets - active assist with mod/max assist of LLE  Marching in // bars - down and back x 6  Side stepping over 6" hurdle - 9 x B LE in // bars  Forward stepping over 6" hurdle - 9 x B LE in // bars   PATIENT EDUCATION:  Education details: Access Code: [email or web address] Person educated: Patient Education method: Explanation, Demonstration, and Handouts Education comprehension: verbalized understanding and returned demonstration   HOME EXERCISE PROGRAM: Access Code: [email or web address] URL: [email or web address] Date: 11/25/2021 Prepared by: Dorcas Carrow  Exercises - Straight Leg Raise  - 1 x daily - 7 x weekly - 2 sets - 10 reps - Supine Heel  Slide  - 1 x daily - 7 x weekly - 2 sets - 10 reps - Supine Hip Adduction Isometric with Ball  - 1 x daily - 7 x weekly - 2 sets - 10 reps - Hooklying Gluteal Sets  - 1 x daily - 7 x weekly - 2 sets - 10 reps - Seated Heel Raise  - 1 x daily - 7 x weekly - 2 sets - 10 reps - Seated Heel Toe Raises  - 1 x daily - 7 x weekly - 2 sets - 10 reps  ASSESSMENT:  CLINICAL IMPRESSION: Pt exhibited decreased L hip flexor strength and decreased L glute med strength. Pt has tight L iliopsoas, and will benefit from prolonged supine stretching of L iliopsoas. Pt required moderate to max assist with AAROM hip abduction in side lying, but stated that her body felt immense pressure relief when lying on her R side.  Pt demonstrated trendelenburg throughout ambulation in the clinic, with the  dropping of R hip (due to L glute med weakness). Pt's biggest goal is to exhibit a typical heel strike on L LE during ambulation, which requires strengthening of L hip flexors. Pt will benefit from further skilled PT in order to strengthen her LE's, and to progress her gait mechanics.   OBJECTIVE IMPAIRMENTS Abnormal gait, decreased activity tolerance, decreased balance, decreased endurance, decreased mobility, difficulty walking, decreased ROM, decreased strength, hypomobility, impaired flexibility, impaired sensation, improper body mechanics, postural dysfunction, obesity, and pain.   ACTIVITY LIMITATIONS carrying, lifting, bending, sitting, standing, squatting, sleeping, stairs, transfers, bed mobility, dressing, and locomotion level  PARTICIPATION LIMITATIONS: cleaning, laundry, driving, shopping, community activity, occupation, and yard work  PERSONAL FACTORS Age, Education, and Past/current experiences are also affecting patient's functional outcome.   REHAB POTENTIAL: Good  CLINICAL DECISION MAKING: Evolving/moderate complexity  EVALUATION COMPLEXITY: Moderate   GOALS: Goals reviewed with patient? Yes  SHORT TERM GOALS: Target date: 01/20/2022  Pt will ambulate through the clinic with a symmetrical gait pattern, with equal step length and decreased pain for > 100 ft.  Baseline: Asymmetrical gait - increased L step length and decreased R step length Goal status: INITIAL   LONG TERM GOALS: Target date: 02/17/2022  Pt will increase FOTO score to > 58 Baseline:  Goal status: INITIAL  2.  Pt will decrease pain level to < 2/10 during all ADLs, in order to efficiently accomplish activities with decreased pain and increased independence Baseline:  Goal status: INITIAL  3.  Pt will increase Active L knee flexion ROM to > 120 degrees  Baseline: 99, 106 passive Goal status: INITIAL    PLAN: PT FREQUENCY: 2x/week  PT DURATION: 8 weeks  PLANNED INTERVENTIONS: Therapeutic  exercises, Therapeutic activity, Neuromuscular re-education, Balance training, Gait training, Patient/Family education, Joint mobilization, Stair training, Aquatic Therapy, Cryotherapy, Moist heat, and Manual therapy.  PLAN FOR NEXT SESSION: Update HEP, strengthen L LE and Hip flexion and stretch L iliopsoas. Gait training. Endurance emphasis.  RECHECK 5xSTS  Cammie Mcgee, PT, DPT # [id number] Thornell Sartorius, SPT 12/23/2021, 1:14 PM

## 2021-12-28 ENCOUNTER — Ambulatory Visit: Payer: BC Managed Care – PPO

## 2021-12-28 DIAGNOSIS — R2689 Other abnormalities of gait and mobility: Secondary | ICD-10-CM

## 2021-12-28 DIAGNOSIS — M6281 Muscle weakness (generalized): Secondary | ICD-10-CM

## 2021-12-28 DIAGNOSIS — M5459 Other low back pain: Secondary | ICD-10-CM

## 2021-12-28 DIAGNOSIS — R269 Unspecified abnormalities of gait and mobility: Secondary | ICD-10-CM

## 2021-12-28 DIAGNOSIS — M25652 Stiffness of left hip, not elsewhere classified: Secondary | ICD-10-CM

## 2021-12-28 DIAGNOSIS — M25552 Pain in left hip: Secondary | ICD-10-CM

## 2021-12-28 NOTE — Therapy (Addendum)
OUTPATIENT PHYSICAL THERAPY THORACOLUMBAR TREATMENT   Patient Name: Yesenia Stevens MRN: [id number] DOB:09/24/1965, 56 y.o., female Today's Date: 12/28/2021   PT End of Session - 12/28/21 1316     Visit Number 9    Number of Visits 17    Date for PT Re-Evaluation 01/20/22    PT Start Time 1116    PT Stop Time 1202    PT Time Calculation (min) 46 min    Equipment Utilized During Treatment Gait belt    Activity Tolerance Patient limited by pain;Patient limited by fatigue    Behavior During Therapy [facility] for tasks assessed/performed                Past Medical History:  Diagnosis Date   Diabetes mellitus without complication (HCC)    Hypertension    Past Surgical History:  Procedure Laterality Date   CESAREAN SECTION     CHOLECYSTECTOMY     COLONOSCOPY WITH PROPOFOL N/A 12/10/2019   Procedure: COLONOSCOPY WITH PROPOFOL;  Surgeon: Wyline Mood, MD;  Location: [facility] ENDOSCOPY;  Service: Gastroenterology;  Laterality: N/A;   COLONOSCOPY WITH PROPOFOL N/A 12/24/2019   Procedure: COLONOSCOPY WITH PROPOFOL;  Surgeon: Wyline Mood, MD;  Location: [facility] ENDOSCOPY;  Service: Gastroenterology;  Laterality: N/A;   FRACTURE SURGERY     Patient Active Problem List   Diagnosis Date Noted   Hyperlipidemia associated with type 2 diabetes mellitus (HCC) 09/22/2021   Palpitations 03/19/2020   Diarrhea 03/19/2020   Premature atrial contractions 08/20/2018   Morbid (severe) obesity due to excess calories (HCC) 08/01/2018   Shortness of breath 07/26/2018   Chronic cough 03/22/2018   Diabetes mellitus without complication (HCC) 03/22/2018   Hypertension 03/22/2018    PCP: Larae Grooms, NP   REFERRING PROVIDER: Ernst Bowler, MD  REFERRING DIAG:  Diagnosis  S32.15XD (ICD-10-CM) - Type 2 fracture of sacrum, subsequent encounter for fracture with routine healing  S32.422D (ICD-10-CM) - Displaced fracture of posterior wall of left acetabulum, subsequent encounter for fracture  with routine healing    Rationale for Evaluation and Treatment Rehabilitation  THERAPY DIAG:  Stiffness of left hip joint  Other low back pain  Gait difficulty  Pain in left hip  Balance problems  Muscle weakness (generalized)  ONSET DATE: 05/28/21  SUBJECTIVE:                                                                                                                                                                                           SUBJECTIVE STATEMENT: Pt presented with 0/10 pain in knee or hip, but has been experiencing stiffness on occasion. Pt often feels pulling and tightness  in L hip flexor, along with her L glute. Pt had minor swelling of L ankle, and has an MRI on it tomorrow (12/29/21). Pt did not have pain associated with ankle swelling, but had tenderness in L anterior tibialis. Pt has been practicing bed mobility at home, and has increased her independence with ambulation at home.   PERTINENT HISTORY:  Hx of L hip dislocation, and screws placed in lower back after her injury. Pt left hospital in a walker, and utilized a WC originally during [facility]. Pt has decreased ankle DF in L ankle, and pain in her lateral foot / 5th metatarsal. Pt had L knee scope, and has tenderness/bruising in L LE along tibia.  PAIN:  Are you having pain? NPRS scale: 0/10 Pain location: hip, back, legs Pain description: popping and dull/achy Aggravating factors: standing, bending forward (especially left) Relieving factors: sitting down, tylenol PRN   PRECAUTIONS: Anterior hip, Posterior hip, Knee, and Fall  WEIGHT BEARING RESTRICTIONS no  FALLS:  Has patient fallen in last 6 months? Yes. Number of falls 1  LIVING ENVIRONMENT: Lives with: lives with their family Lives in: House/apartment Stairs: yes - 2 to get into home with raining Has following equipment at home: Quad cane large base and Wheelchair (manual)  OCCUPATION:  Pt is currently on long term disability. Previously  worked at [employer] prior to injury. Would like to get back to work if the company can find a position that is more stationary.  PLOF: Independent  PATIENT GOALS Pt wants to alleviate pain, become more mobile, strengthen back musculature, strengthen LEs   OBJECTIVE:   DIAGNOSTIC FINDINGS:  LUMBAR SPINE - 2-3 VIEW   COMPARISON:  MRI 09/28/2009   FINDINGS: 4-5 mm retrolisthesis L5-S1, slightly progressive since prior examination, possibly degenerative in nature. Otherwise normal lumbar lordosis. No acute fracture of the lumbar spine. Vertebral body height is preserved. Intervertebral disc space narrowing and endplate remodeling at L4-5 and L5-S1 is in keeping with mild degenerative disc disease at these levels. Paraspinal soft tissues are unremarkable. Right sacroiliac arthrodesis has been performed with 2 partially threaded screws.   IMPRESSION: No acute fracture or traumatic listhesis.   Degenerative changes L4-S1 with 5 mm retrolisthesis L5-S1.     Electronically Signed   By: Helyn Numbers M.D.   On: 09/17/2021 22:13  PATIENT SURVEYS:  FOTO 52 ; 61  SCREENING FOR RED FLAGS: Bowel or bladder incontinence: No Spinal tumors: No Cauda equina syndrome: No Compression fracture: No Abdominal aneurysm: No  COGNITION:  Overall cognitive status: Within functional limits for tasks assessed     SENSATION: Not tested - patient reports altered sensation in L LE/foot  POSTURE: rounded shoulders, forward head, and weight shift left  PALPATION: Moderate L medial lower leg tenderness with palpation  LUMBAR ROM:   Active  AROM  eval  Flexion 50% limited (compensation to R)  Extension 75% limited (pain)  Right lateral flexion   Left lateral flexion   Right rotation 50% limited  Left rotation 25% limited   (Blank rows = not tested)  LOWER EXTREMITY ROM:     PROM/AROM Right eval Left eval  Hip flexion WFL 119/ 90 deg.  Hip extension    Hip abduction [facility]  Hip adduction    Hip internal rotation    Hip external rotation    Knee flexion WFL 106/ 99 deg.  Knee extension    Ankle dorsiflexion WFL Limited by pain  Ankle plantarflexion WFL Limited by pain  Ankle inversion    Ankle eversion     (Blank rows = not tested)  LOWER EXTREMITY MMT:    MMT Right eval Left eval  Hip flexion 4/5 3/5  Hip extension    Hip abduction 4/5 3/5  Hip adduction 4/5 4/5  Hip internal rotation 5/5 unable  Hip external rotation 5/5 3/5  Knee flexion 5/5 3/5 (pain)  Knee extension 5/5 4/5  Ankle dorsiflexion 4+/5 3/5  Ankle plantarflexion 5/5 4/5  Ankle inversion    Ankle eversion     (Blank rows = not tested)  FUNCTIONAL TESTS:  5 times sit to stand: 25.4 seconds  GAIT: Distance walked: in clinic Assistive device utilized: Quad cane small base Level of assistance: Modified independence Comments: R sided Trendelenburg.  L antalgic gait pattern with decrease stance on L with short R LE step length/ shuffling.  No arm swing. Walks with wide base of support, with mild supination of B feet. Pt had limited endurance, and significantly limited L hip flexion and DF to clear her foot during swing phase.    TX: 12/28/21:  Standing hip abduction with 4# ankle weight on R LE - no weight on L LE. 2 sets of 10 x per LE in // bars (20 x total per LE)  Forward stepping up onto 6" step in // bars, followed by descending forward off of step X 3 with L foot first, then x 3 with R foot first. Intermittent need and use of SUE support.  Backwards stepping back up onto 6" step, followed by stepping off of the step backwards onto the ground x 3 with L foot first, then x 3 with R foot first. Intermittent need and use of SUE support.  Supine on blue mat table with red wedge under back for support:    SLR 20 x per LE - 2 sets    L Hip abduction with straight leg in R side lying - 20 x 2 sets - min assist of L LE for 1st set , independent for 2nd set    R Hip  abduction with straight leg in L side lying - 20 x 2 sets - independent for both sets    Bridging 20 x - 2 sets ( 1 sec hold)    Bridging 5 x w/ 5s hold with TA engagement  Scifit - 8 minutes (pt did not enjoy due to L foot positioning)  Pt education/emphasis on ambulating through the home independently, especially for short distances   PATIENT EDUCATION:  Education details: Access Code: [email or web address] Person educated: Patient Education method: Explanation, Demonstration, and Handouts Education comprehension: verbalized understanding and returned demonstration   HOME EXERCISE PROGRAM: Access Code: [email or web address] URL: [email or web address] Date: 11/25/2021 Prepared by: Dorene Grebe  Exercises - Straight Leg Raise  - 1 x daily - 7 x weekly - 2 sets - 10 reps - Supine Heel Slide  - 1 x daily - 7 x weekly - 2 sets - 10 reps - Supine Hip Adduction Isometric with Ball  - 1 x daily - 7 x weekly - 2 sets - 10 reps - Hooklying Gluteal Sets  - 1 x daily - 7 x weekly - 2 sets - 10 reps - Seated Heel Raise  - 1 x daily - 7 x weekly - 2 sets - 10 reps - Seated Heel Toe Raises  - 1 x daily - 7 x weekly - 2 sets - 10 reps  ASSESSMENT:  CLINICAL IMPRESSION: Pt demonstrated improved L hip  abduction strength in side lying against gravity, and was able to perform the intervention independently for the first time during her second set in clinic. Pt also demonstrated improved SLR mechanics, while maintaining an extended R LE onto the table as she raised her L LE. Pt required two water breaks in clinic throughout tx session and three seated breaks. Pt's biggest goal is to exhibit a typical heel strike on L LE during ambulation, which requires strengthening of L hip flexors. Pt will benefit from further skilled PT in order to strengthen her LE's, and to progress her gait mechanics.   OBJECTIVE IMPAIRMENTS Abnormal gait, decreased activity tolerance, decreased balance, decreased endurance, decreased  mobility, difficulty walking, decreased ROM, decreased strength, hypomobility, impaired flexibility, impaired sensation, improper body mechanics, postural dysfunction, obesity, and pain.   ACTIVITY LIMITATIONS carrying, lifting, bending, sitting, standing, squatting, sleeping, stairs, transfers, bed mobility, dressing, and locomotion level  PARTICIPATION LIMITATIONS: cleaning, laundry, driving, shopping, community activity, occupation, and yard work  PERSONAL FACTORS Age, Education, and Past/current experiences are also affecting patient's functional outcome.   REHAB POTENTIAL: Good  CLINICAL DECISION MAKING: Evolving/moderate complexity  EVALUATION COMPLEXITY: Moderate   GOALS: Goals reviewed with patient? Yes  SHORT TERM GOALS: Target date: 01/25/2022  Pt will ambulate through the clinic with a symmetrical gait pattern, with equal step length and decreased pain for > 100 ft.  Baseline: Asymmetrical gait - increased L step length and decreased R step length Goal status: INITIAL   LONG TERM GOALS: Target date: 02/22/2022  Pt will increase FOTO score to > 58 Baseline:  Goal status: INITIAL  2.  Pt will decrease pain level to < 2/10 during all ADLs, in order to efficiently accomplish activities with decreased pain and increased independence Baseline:  Goal status: INITIAL  3.  Pt will increase Active L knee flexion ROM to > 120 degrees  Baseline: 99, 106 passive Goal status: INITIAL    PLAN: PT FREQUENCY: 2x/week  PT DURATION: 8 weeks  PLANNED INTERVENTIONS: Therapeutic exercises, Therapeutic activity, Neuromuscular re-education, Balance training, Gait training, Patient/Family education, Joint mobilization, Stair training, Aquatic Therapy, Cryotherapy, Moist heat, and Manual therapy.  PLAN FOR NEXT SESSION: Update HEP, strengthen L LE and Hip flexion / hip abduction and stretch L iliopsoas. Gait training. Endurance emphasis.  RECHECK 5xSTS   Delphia Grates. Fairly IV, PT,  DPT Physical Therapist- [facility]  [facility]   [place], [place] 12/28/2021, 2:51 PM

## 2021-12-29 ENCOUNTER — Telehealth: Payer: Self-pay | Admitting: Emergency Medicine

## 2021-12-29 MED ORDER — MOXIFLOXACIN HCL 0.5 % OP SOLN: 1.0000 [drp] | Freq: Three times a day (TID) | OPHTHALMIC | 0 refills | Status: DC

## 2021-12-29 NOTE — Telephone Encounter (Signed)
Patient notified urgent care that she never completed recommended eyedrops due to backorder at pharmacy, eye pain resolved and then began again 1 day ago.  Moxifloxacin sent to pharmacy and patient has been notified to make an appointment with ophthalmology for evaluation, moving forward if pain persist she will need to be seen in person

## 2021-12-29 NOTE — Telephone Encounter (Addendum)
Pt called stating the eye drops were prescribed on 12/17/21 were on back order. Spoke with Salli Quarry, NP. She will call another rx in. And advised pt to follow up with her ophthalmologist. Pt voiced understanding.

## 2021-12-30 ENCOUNTER — Encounter: Payer: Self-pay | Admitting: Physical Therapy

## 2022-01-03 ENCOUNTER — Ambulatory Visit: Payer: BC Managed Care – PPO | Admitting: Physical Therapy

## 2022-01-05 ENCOUNTER — Encounter: Payer: Self-pay | Admitting: Physical Therapy

## 2022-01-11 ENCOUNTER — Ambulatory Visit: Payer: BC Managed Care – PPO | Admitting: Physical Therapy

## 2022-01-11 ENCOUNTER — Encounter: Payer: Self-pay | Admitting: Physical Therapy

## 2022-01-11 DIAGNOSIS — M5459 Other low back pain: Secondary | ICD-10-CM

## 2022-01-11 DIAGNOSIS — M25652 Stiffness of left hip, not elsewhere classified: Secondary | ICD-10-CM

## 2022-01-11 DIAGNOSIS — R2689 Other abnormalities of gait and mobility: Secondary | ICD-10-CM

## 2022-01-11 DIAGNOSIS — M6281 Muscle weakness (generalized): Secondary | ICD-10-CM

## 2022-01-11 DIAGNOSIS — M25552 Pain in left hip: Secondary | ICD-10-CM

## 2022-01-11 DIAGNOSIS — R269 Unspecified abnormalities of gait and mobility: Secondary | ICD-10-CM

## 2022-01-11 NOTE — Therapy (Addendum)
OUTPATIENT PHYSICAL THERAPY THORACOLUMBAR TREATMENT Physical Therapy Progress Note   Dates of reporting period  11/25/21   to  01/11/22   Patient Name: Yesenia Stevens MRN: [id number] DOB:Sep 30, 1965, 56 y.o., female Today's Date: 01/11/2022   PT End of Session - 01/11/22 1116     Visit Number 10    Number of Visits 17    Date for PT Re-Evaluation 01/20/22    PT Start Time 1116    Equipment Utilized During Treatment Gait belt    Activity Tolerance Patient limited by pain;Patient limited by fatigue    Behavior During Therapy [facility] for tasks assessed/performed             1116 to 1204   Past Medical History:  Diagnosis Date   Diabetes mellitus without complication ([place])    Hypertension    Past Surgical History:  Procedure Laterality Date   CESAREAN SECTION     CHOLECYSTECTOMY     COLONOSCOPY WITH PROPOFOL N/A 12/10/2019   Procedure: COLONOSCOPY WITH PROPOFOL;  Surgeon: [name], MD;  Location: [facility] ENDOSCOPY;  Service: Gastroenterology;  Laterality: N/A;   COLONOSCOPY WITH PROPOFOL N/A 12/24/2019   Procedure: COLONOSCOPY WITH PROPOFOL;  Surgeon: [name], MD;  Location: [facility] ENDOSCOPY;  Service: Gastroenterology;  Laterality: N/A;   FRACTURE SURGERY     Patient Active Problem List   Diagnosis Date Noted   Hyperlipidemia associated with type 2 diabetes mellitus ([place]) 09/22/2021   Palpitations 03/19/2020   Diarrhea 03/19/2020   Premature atrial contractions 08/20/2018   Morbid (severe) obesity due to excess calories ([place]) 08/01/2018   Shortness of breath 07/26/2018   Chronic cough 03/22/2018   Diabetes mellitus without complication ([place]) 06/28/3233   Hypertension 03/22/2018    PCP: Jon Billings, NP   REFERRING PROVIDER: Katherine Mantle, MD  REFERRING DIAG:  Diagnosis  S32.15XD (ICD-10-CM) - Type 2 fracture of sacrum, subsequent encounter for fracture with routine healing  S32.422D (ICD-10-CM) - Displaced fracture of posterior wall of left  acetabulum, subsequent encounter for fracture with routine healing    Rationale for Evaluation and Treatment Rehabilitation  THERAPY DIAG:  Stiffness of left hip joint  Other low back pain  Gait difficulty  Pain in left hip  Balance problems  Muscle weakness (generalized)  ONSET DATE: 05/28/21  SUBJECTIVE:                                                                                                                                                                                           SUBJECTIVE STATEMENT: Pt presents to tx. With c/o L lower leg/ ankle swelling.  Pt. Had a visit with Dr. Sonny Masters  on 7/12.   Pt. Limited with walking while in [place], esp. At airport.  Pt. Reports 4-5/10 L hip discomfort today.    PERTINENT HISTORY:  Hx of L hip dislocation, and screws placed in lower back after her injury. Pt left hospital in a walker, and utilized a WC originally during [employer]. Pt has decreased ankle DF in L ankle, and pain in her lateral foot / 5th metatarsal. Pt had L knee scope, and has tenderness/bruising in L LE along tibia.  PAIN:  Are you having pain? NPRS scale: 0/10 Pain location: hip, back, legs Pain description: popping and dull/achy Aggravating factors: standing, bending forward (especially left) Relieving factors: sitting down, tylenol PRN   PRECAUTIONS: Anterior hip, Posterior hip, Knee, and Fall  WEIGHT BEARING RESTRICTIONS no  FALLS:  Has patient fallen in last 6 months? Yes. Number of falls 1  LIVING ENVIRONMENT: Lives with: lives with their family Lives in: House/apartment Stairs: yes - 2 to get into home with raining Has following equipment at home: Quad cane large base and Wheelchair (manual)  OCCUPATION:  Pt is currently on long term disability. Previously worked at [employer] prior to injury. Would like to get back to work if the company can find a position that is more stationary.  PLOF: Independent  PATIENT GOALS Pt wants to  alleviate pain, become more mobile, strengthen back musculature, strengthen LEs   OBJECTIVE:   DIAGNOSTIC FINDINGS:  LUMBAR SPINE - 2-3 VIEW   COMPARISON:  MRI 09/28/2009   FINDINGS: 4-5 mm retrolisthesis L5-S1, slightly progressive since prior examination, possibly degenerative in nature. Otherwise normal lumbar lordosis. No acute fracture of the lumbar spine. Vertebral body height is preserved. Intervertebral disc space narrowing and endplate remodeling at L2-4 and L5-S1 is in keeping with mild degenerative disc disease at these levels. Paraspinal soft tissues are unremarkable. Right sacroiliac arthrodesis has been performed with 2 partially threaded screws.   IMPRESSION: No acute fracture or traumatic listhesis.   Degenerative changes L4-S1 with 5 mm retrolisthesis L5-S1.     Electronically Signed   By: Fidela Salisbury M.D.   On: 09/17/2021 22:13  PATIENT SURVEYS:  FOTO 52 ; 61  SCREENING FOR RED FLAGS: Bowel or bladder incontinence: No Spinal tumors: No Cauda equina syndrome: No Compression fracture: No Abdominal aneurysm: No  COGNITION:  Overall cognitive status: Within functional limits for tasks assessed     SENSATION: Not tested - patient reports altered sensation in L LE/foot  POSTURE: rounded shoulders, forward head, and weight shift left  PALPATION: Moderate L medial lower leg tenderness with palpation  LUMBAR ROM:   Active  AROM  eval  Flexion 50% limited (compensation to R)  Extension 75% limited (pain)  Right lateral flexion   Left lateral flexion   Right rotation 50% limited  Left rotation 25% limited   (Blank rows = not tested)  LOWER EXTREMITY ROM:     PROM/AROM Right eval Left eval  Hip flexion WFL 119/ 90 deg.  Hip extension    Hip abduction [facility]  Hip adduction    Hip internal rotation    Hip external rotation    Knee flexion WFL 106/ 99 deg.  Knee extension    Ankle dorsiflexion WFL Limited by pain  Ankle  plantarflexion WFL Limited by pain  Ankle inversion    Ankle eversion     (Blank rows = not tested)  LOWER EXTREMITY MMT:    MMT Right eval Left eval  Hip flexion 4/5 3/5  Hip extension    Hip abduction 4/5 3/5  Hip adduction 4/5 4/5  Hip internal rotation 5/5 unable  Hip external rotation 5/5 3/5  Knee flexion 5/5 3/5 (pain)  Knee extension 5/5 4/5  Ankle dorsiflexion 4+/5 3/5  Ankle plantarflexion 5/5 4/5  Ankle inversion    Ankle eversion     (Blank rows = not tested)  FUNCTIONAL TESTS:  5 times sit to stand: 25.4 seconds  GAIT: Distance walked: in clinic Assistive device utilized: Quad cane small base Level of assistance: Modified independence Comments: R sided Trendelenburg.  L antalgic gait pattern with decrease stance on L with short R LE step length/ shuffling.  No arm swing. Walks with wide base of support, with mild supination of B feet. Pt had limited endurance, and significantly limited L hip flexion and DF to clear her foot during swing phase.    TX:  01/11/22:  Discussed trip to [place]/ walking.      There.ex.:  Nustep L4 10 min. B UE/LE.    Standing L/R step touches at 6" step (stairs).  Standing heel/toe raises/ hip abduction/ extension 20x.  B UE assist for safety/ balance in //-bars.    Walking in clinic with use of SPC and 2-point gait pattern.  L antalgic/ stiff gait noted   Walking marching.  Reviewed HEP.    12/28/21:  Standing hip abduction with 4# ankle weight on R LE - no weight on L LE. 2 sets of 10 x per LE in // bars (20 x total per LE)  Forward stepping up onto 6" step in // bars, followed by descending forward off of step X 3 with L foot first, then x 3 with R foot first. Intermittent need and use of SUE support.  Backwards stepping back up onto 6" step, followed by stepping off of the step backwards onto the ground x 3 with L foot first, then x 3 with R foot first. Intermittent need and use of SUE support.  Supine on blue  mat table with red wedge under back for support:    SLR 20 x per LE - 2 sets    L Hip abduction with straight leg in R side lying - 20 x 2 sets - min assist of L LE for 1st set , independent for 2nd set    R Hip abduction with straight leg in L side lying - 20 x 2 sets - independent for both sets    Bridging 20 x - 2 sets ( 1 sec hold)    Bridging 5 x w/ 5s hold with TA engagement  Scifit - 8 minutes (pt did not enjoy due to L foot positioning)  Pt education/emphasis on ambulating through the home independently, especially for short distances   PATIENT EDUCATION:  Education details: Access Code: [email or web address] Person educated: Patient Education method: Explanation, Demonstration, and Handouts Education comprehension: verbalized understanding and returned demonstration   HOME EXERCISE PROGRAM: Access Code: [email or web address] URL: [email or web address] Date: 11/25/2021 Prepared by: Dorcas Carrow  Exercises - Straight Leg Raise  - 1 x daily - 7 x weekly - 2 sets - 10 reps - Supine Heel Slide  - 1 x daily - 7 x weekly - 2 sets - 10 reps - Supine Hip Adduction Isometric with Ball  - 1 x daily - 7 x weekly - 2 sets - 10 reps - Hooklying Gluteal Sets  - 1 x daily - 7 x weekly - 2 sets - 10 reps - Seated Heel  Raise  - 1 x daily - 7 x weekly - 2 sets - 10 reps - Seated Heel Toe Raises  - 1 x daily - 7 x weekly - 2 sets - 10 reps  ASSESSMENT:  CLINICAL IMPRESSION: Pt continues to ambulate with L antalgic gait secondary to hip weakness/ ankle limitations.  Pt. Safe/ cautious with walking and benefits from assistive device. Pt's biggest goal is to exhibit a typical heel strike on L LE during ambulation, which requires strengthening of L hip flexors. Pt will benefit from further skilled PT in order to strengthen her LE's, and to progress her gait mechanics.   OBJECTIVE IMPAIRMENTS Abnormal gait, decreased activity tolerance, decreased balance, decreased endurance, decreased mobility,  difficulty walking, decreased ROM, decreased strength, hypomobility, impaired flexibility, impaired sensation, improper body mechanics, postural dysfunction, obesity, and pain.   ACTIVITY LIMITATIONS carrying, lifting, bending, sitting, standing, squatting, sleeping, stairs, transfers, bed mobility, dressing, and locomotion level  PARTICIPATION LIMITATIONS: cleaning, laundry, driving, shopping, community activity, occupation, and yard work  PERSONAL FACTORS Age, Education, and Past/current experiences are also affecting patient's functional outcome.   REHAB POTENTIAL: Good  CLINICAL DECISION MAKING: Evolving/moderate complexity  EVALUATION COMPLEXITY: Moderate   GOALS: Goals reviewed with patient? Yes  SHORT TERM GOALS: Target date: 02/08/2022  Pt will ambulate through the clinic with a symmetrical gait pattern, with equal step length and decreased pain for > 100 ft.  Baseline: Asymmetrical gait - increased L step length and decreased R step length Goal status: Partially met   LONG TERM GOALS: Target date: 03/08/2022  Pt will increase FOTO score to > 58 Baseline:  Goal status: On-going  2.  Pt will decrease pain level to < 2/10 during all ADLs, in order to efficiently accomplish activities with decreased pain and increased independence Baseline:  Goal status: Not met  3.  Pt will increase Active L knee flexion ROM to > 120 degrees  Baseline: 99, 106 passive Goal status: On-going    PLAN: PT FREQUENCY: 2x/week  PT DURATION: 8 weeks  PLANNED INTERVENTIONS: Therapeutic exercises, Therapeutic activity, Neuromuscular re-education, Balance training, Gait training, Patient/Family education, Joint mobilization, Stair training, Aquatic Therapy, Cryotherapy, Moist heat, and Manual therapy.  PLAN FOR NEXT SESSION: Update HEP, strengthen L LE and Hip flexion / hip abduction and stretch L iliopsoas. Gait training. Endurance emphasis.  RECHECK 5xSTS  Pura Spice, PT, DPT #  [id number] 01/11/2022, 11:17 AM

## 2022-01-13 ENCOUNTER — Ambulatory Visit: Payer: BC Managed Care – PPO | Admitting: Physical Therapy

## 2022-01-13 DIAGNOSIS — M25652 Stiffness of left hip, not elsewhere classified: Secondary | ICD-10-CM

## 2022-01-13 DIAGNOSIS — M6281 Muscle weakness (generalized): Secondary | ICD-10-CM

## 2022-01-13 DIAGNOSIS — R269 Unspecified abnormalities of gait and mobility: Secondary | ICD-10-CM

## 2022-01-13 DIAGNOSIS — R2689 Other abnormalities of gait and mobility: Secondary | ICD-10-CM

## 2022-01-13 DIAGNOSIS — M25552 Pain in left hip: Secondary | ICD-10-CM

## 2022-01-13 DIAGNOSIS — M5459 Other low back pain: Secondary | ICD-10-CM

## 2022-01-15 NOTE — Therapy (Signed)
OUTPATIENT PHYSICAL THERAPY THORACOLUMBAR TREATMENT   Patient Name: Yesenia Stevens MRN: [id number] DOB:[birth year], 56 y.o., female Today's Date: 01/15/2022   PT End of Session - 01/15/22 1151     Visit Number 11    Number of Visits 17    Date for PT Re-Evaluation 01/20/22    PT Start Time 0076    PT Stop Time 1203    PT Time Calculation (min) 39 min    Equipment Utilized During Treatment Gait belt    Activity Tolerance Patient limited by pain;Patient limited by fatigue    Behavior During Therapy [facility] for tasks assessed/performed             Past Medical History:  Diagnosis Date   Diabetes mellitus without complication ([place])    Hypertension    Past Surgical History:  Procedure Laterality Date   CESAREAN SECTION     CHOLECYSTECTOMY     COLONOSCOPY WITH PROPOFOL N/A 12/10/2019   Procedure: COLONOSCOPY WITH PROPOFOL;  Surgeon: [name], MD;  Location[facility] ENDOSCOPY;  Service: Gastroenterology;  Laterality: N/A;   COLONOSCOPY WITH PROPOFOL N/A 12/24/2019   Procedure: COLONOSCOPY WITH PROPOFOL;  Surgeon: [name], MD;  Location[facility] ENDOSCOPY;  Service: Gastroenterology;  Laterality: N/A;   FRACTURE SURGERY     Patient Active Problem List   Diagnosis Date Noted   Hyperlipidemia associated with type 2 diabetes mellitus ([place]) 09/22/2021   Palpitations 03/19/2020   Diarrhea 03/19/2020   Premature atrial contractions 08/20/2018   Morbid (severe) obesity due to excess calories ([place]) 08/01/2018   Shortness of breath 07/26/2018   Chronic cough 03/22/2018   Diabetes mellitus without complication ([place]) 22/63/3354   Hypertension 03/22/2018    PCP: Jon Billings, NP   REFERRING PROVIDER: Katherine Mantle, MD  REFERRING DIAG:  Diagnosis  S32.15XD (ICD-10-CM) - Type 2 fracture of sacrum, subsequent encounter for fracture with routine healing  S32.422D (ICD-10-CM) - Displaced fracture of posterior wall of left acetabulum, subsequent encounter for fracture with  routine healing    Rationale for Evaluation and Treatment Rehabilitation  THERAPY DIAG:  Stiffness of left hip joint  Other low back pain  Gait difficulty  Pain in left hip  Balance problems  Muscle weakness (generalized)  ONSET DATE: 05/28/21  SUBJECTIVE:                                                                                                                                                                                           SUBJECTIVE STATEMENT: Pt. C/o swelling in L leg and ordered compression stockings.  Pt. Reports the stockings arrived and were the wrong size.  L hip discomfort  today.      PERTINENT HISTORY:  Hx of L hip dislocation, and screws placed in lower back after her injury. Pt left hospital in a walker, and utilized a WC originally during [facility]. Pt has decreased ankle DF in L ankle, and pain in her lateral foot / 5th metatarsal. Pt had L knee scope, and has tenderness/bruising in L LE along tibia.  PAIN:  Are you having pain? NPRS scale: 4/10 Pain location: hip, back, legs Pain description: popping and dull/achy Aggravating factors: standing, bending forward (especially left) Relieving factors: sitting down, tylenol PRN   PRECAUTIONS: Anterior hip, Posterior hip, Knee, and Fall  WEIGHT BEARING RESTRICTIONS no  FALLS:  Has patient fallen in last 6 months? Yes. Number of falls 1  LIVING ENVIRONMENT: Lives with: lives with their family Lives in: House/apartment Stairs: yes - 2 to get into home with raining Has following equipment at home: Quad cane large base and Wheelchair (manual)  OCCUPATION:  Pt is currently on long term disability. Previously worked at [employer] prior to injury. Would like to get back to work if the company can find a position that is more stationary.  PLOF: Independent  PATIENT GOALS Pt wants to alleviate pain, become more mobile, strengthen back musculature, strengthen LEs   OBJECTIVE:   DIAGNOSTIC FINDINGS:   LUMBAR SPINE - 2-3 VIEW   COMPARISON:  MRI 09/28/2009   FINDINGS: 4-5 mm retrolisthesis L5-S1, slightly progressive since prior examination, possibly degenerative in nature. Otherwise normal lumbar lordosis. No acute fracture of the lumbar spine. Vertebral body height is preserved. Intervertebral disc space narrowing and endplate remodeling at O3-7 and L5-S1 is in keeping with mild degenerative disc disease at these levels. Paraspinal soft tissues are unremarkable. Right sacroiliac arthrodesis has been performed with 2 partially threaded screws.   IMPRESSION: No acute fracture or traumatic listhesis.   Degenerative changes L4-S1 with 5 mm retrolisthesis L5-S1.     Electronically Signed   By: Fidela Salisbury M.D.   On: 09/17/2021 22:13  PATIENT SURVEYS:  FOTO 52 ; 61  SCREENING FOR RED FLAGS: Bowel or bladder incontinence: No Spinal tumors: No Cauda equina syndrome: No Compression fracture: No Abdominal aneurysm: No  COGNITION:  Overall cognitive status: Within functional limits for tasks assessed     SENSATION: Not tested - patient reports altered sensation in L LE/foot  POSTURE: rounded shoulders, forward head, and weight shift left  PALPATION: Moderate L medial lower leg tenderness with palpation  LUMBAR ROM:   Active  AROM  eval  Flexion 50% limited (compensation to R)  Extension 75% limited (pain)  Right lateral flexion   Left lateral flexion   Right rotation 50% limited  Left rotation 25% limited   (Blank rows = not tested)  LOWER EXTREMITY ROM:     PROM/AROM Right eval Left eval  Hip flexion WFL 119/ 90 deg.  Hip extension    Hip abduction [facility]  Hip adduction    Hip internal rotation    Hip external rotation    Knee flexion WFL 106/ 99 deg.  Knee extension    Ankle dorsiflexion WFL Limited by pain  Ankle plantarflexion WFL Limited by pain  Ankle inversion    Ankle eversion     (Blank rows = not tested)  LOWER EXTREMITY MMT:     MMT Right eval Left eval  Hip flexion 4/5 3/5  Hip extension    Hip abduction 4/5 3/5  Hip adduction 4/5 4/5  Hip internal rotation 5/5  unable  Hip external rotation 5/5 3/5  Knee flexion 5/5 3/5 (pain)  Knee extension 5/5 4/5  Ankle dorsiflexion 4+/5 3/5  Ankle plantarflexion 5/5 4/5  Ankle inversion    Ankle eversion     (Blank rows = not tested)  FUNCTIONAL TESTS:  5 times sit to stand: 25.4 seconds  GAIT: Distance walked: in clinic Assistive device utilized: Quad cane small base Level of assistance: Modified independence Comments: R sided Trendelenburg.  L antalgic gait pattern with decrease stance on L with short R LE step length/ shuffling.  No arm swing. Walks with wide base of support, with mild supination of B feet. Pt had limited endurance, and significantly limited L hip flexion and DF to clear her foot during swing phase.    TX:  01/13/22:  There.ex.:  Nustep L4 10 min. B UE/LE (discussed compression stockings/ HEP).    Step ups/downs at 6" step with UE assist.  Focus on hip/knee flexion and proper step pattern.  L/R 10x each.  Walking in clinic with use of SPC and consistent 2-point gait pattern with increase L hip/knee flexion and step pattern.   Supine SLR/heel slides/ hip abduction 20x.  Marked increase in L knee flexion 118 deg. Active (discomfort reported). STS from mat table with proper technique     01/11/22:  Discussed trip to [place]/ walking.      There.ex.:  Nustep L4 10 min. B UE/LE.    Standing L/R step touches at 6" step (stairs).  Standing heel/toe raises/ hip abduction/ extension 20x.  B UE assist for safety/ balance in //-bars.    Walking in clinic with use of SPC and 2-point gait pattern.  L antalgic/ stiff gait noted   Walking marching.  Reviewed HEP.    12/28/21:  Standing hip abduction with 4# ankle weight on R LE - no weight on L LE. 2 sets of 10 x per LE in // bars (20 x total per LE)  Forward stepping up onto 6"  step in // bars, followed by descending forward off of step X 3 with L foot first, then x 3 with R foot first. Intermittent need and use of SUE support.  Backwards stepping back up onto 6" step, followed by stepping off of the step backwards onto the ground x 3 with L foot first, then x 3 with R foot first. Intermittent need and use of SUE support.  Supine on blue mat table with red wedge under back for support:    SLR 20 x per LE - 2 sets    L Hip abduction with straight leg in R side lying - 20 x 2 sets - min assist of L LE for 1st set , independent for 2nd set    R Hip abduction with straight leg in L side lying - 20 x 2 sets - independent for both sets    Bridging 20 x - 2 sets ( 1 sec hold)    Bridging 5 x w/ 5s hold with TA engagement  Scifit - 8 minutes (pt did not enjoy due to L foot positioning)  Pt education/emphasis on ambulating through the home independently, especially for short distances   PATIENT EDUCATION:  Education details: Access Code: [email or web address] Person educated: Patient Education method: Explanation, Demonstration, and Handouts Education comprehension: verbalized understanding and returned demonstration   HOME EXERCISE PROGRAM: Access Code: [email or web address] URL: [email or web address] Date: 11/25/2021 Prepared by: Dorcas Carrow  Exercises - Straight Leg Raise  - 1 x daily - 7  x weekly - 2 sets - 10 reps - Supine Heel Slide  - 1 x daily - 7 x weekly - 2 sets - 10 reps - Supine Hip Adduction Isometric with Ball  - 1 x daily - 7 x weekly - 2 sets - 10 reps - Hooklying Gluteal Sets  - 1 x daily - 7 x weekly - 2 sets - 10 reps - Seated Heel Raise  - 1 x daily - 7 x weekly - 2 sets - 10 reps - Seated Heel Toe Raises  - 1 x daily - 7 x weekly - 2 sets - 10 reps  ASSESSMENT:  CLINICAL IMPRESSION: Pt continues to ambulate with L antalgic gait secondary to hip weakness/ ankle limitations.  Pt. Safe/ cautious with walking and benefits from assistive device.  Pt. Able to complete step ups but requires B UE assist on handrails for safety.  Marked increase in L knee flexion 118 deg. Since initial evaluation.  Pt's biggest goal is to exhibit a typical heel strike on L LE during ambulation, which requires strengthening of L hip flexors. Pt will benefit from further skilled PT in order to strengthen her LE's, and to progress her gait mechanics.   OBJECTIVE IMPAIRMENTS Abnormal gait, decreased activity tolerance, decreased balance, decreased endurance, decreased mobility, difficulty walking, decreased ROM, decreased strength, hypomobility, impaired flexibility, impaired sensation, improper body mechanics, postural dysfunction, obesity, and pain.   ACTIVITY LIMITATIONS carrying, lifting, bending, sitting, standing, squatting, sleeping, stairs, transfers, bed mobility, dressing, and locomotion level  PARTICIPATION LIMITATIONS: cleaning, laundry, driving, shopping, community activity, occupation, and yard work  PERSONAL FACTORS Age, Education, and Past/current experiences are also affecting patient's functional outcome.   REHAB POTENTIAL: Good  CLINICAL DECISION MAKING: Evolving/moderate complexity  EVALUATION COMPLEXITY: Moderate   GOALS: Goals reviewed with patient? Yes  SHORT TERM GOALS: Target date: 02/12/2022  Pt will ambulate through the clinic with a symmetrical gait pattern, with equal step length and decreased pain for > 100 ft.  Baseline: Asymmetrical gait - increased L step length and decreased R step length Goal status: Partially met   LONG TERM GOALS: Target date: 03/12/2022  Pt will increase FOTO score to > 58 Baseline:  Goal status: On-going  2.  Pt will decrease pain level to < 2/10 during all ADLs, in order to efficiently accomplish activities with decreased pain and increased independence Baseline:  Goal status: Not met  3.  Pt will increase Active L knee flexion ROM to > 120 degrees  Baseline: 99, 106 passive Goal status:  On-going    PLAN: PT FREQUENCY: 2x/week  PT DURATION: 8 weeks  PLANNED INTERVENTIONS: Therapeutic exercises, Therapeutic activity, Neuromuscular re-education, Balance training, Gait training, Patient/Family education, Joint mobilization, Stair training, Aquatic Therapy, Cryotherapy, Moist heat, and Manual therapy.  PLAN FOR NEXT SESSION: Update HEP, strengthen L LE and Hip flexion / hip abduction and stretch L iliopsoas. Gait training. Endurance emphasis.  RECHECK 5xSTS  Pura Spice, PT, DPT # [phone] 01/15/2022, 11:53 AM

## 2022-01-20 LAB — HM DIABETES EYE EXAM

## 2022-01-25 ENCOUNTER — Encounter: Payer: Self-pay | Admitting: Physical Therapy

## 2022-01-25 ENCOUNTER — Ambulatory Visit: Payer: BC Managed Care – PPO | Attending: Trauma Surgery | Admitting: Physical Therapy

## 2022-01-25 DIAGNOSIS — M5459 Other low back pain: Secondary | ICD-10-CM | POA: Insufficient documentation

## 2022-01-25 DIAGNOSIS — M6281 Muscle weakness (generalized): Secondary | ICD-10-CM | POA: Insufficient documentation

## 2022-01-25 DIAGNOSIS — M25652 Stiffness of left hip, not elsewhere classified: Secondary | ICD-10-CM | POA: Diagnosis present

## 2022-01-25 DIAGNOSIS — M25552 Pain in left hip: Secondary | ICD-10-CM | POA: Diagnosis present

## 2022-01-25 DIAGNOSIS — R269 Unspecified abnormalities of gait and mobility: Secondary | ICD-10-CM | POA: Insufficient documentation

## 2022-01-25 DIAGNOSIS — R2689 Other abnormalities of gait and mobility: Secondary | ICD-10-CM | POA: Diagnosis present

## 2022-01-25 NOTE — Therapy (Signed)
OUTPATIENT PHYSICAL THERAPY THORACOLUMBAR TREATMENT   Patient Name: Yesenia Stevens MRN: [id number] DOB:1966/03/26, 56 y.o., female Today's Date: 01/25/2022   PT End of Session - 01/25/22 0946     Visit Number 12    Number of Visits 20    Date for PT Re-Evaluation 02/20/22    PT Start Time 0947 to 1028 (41 minutes)    Equipment Utilized During Treatment Gait belt    Activity Tolerance Patient limited by pain;Patient limited by fatigue    Behavior During Therapy [facility] for tasks assessed/performed           Past Medical History:  Diagnosis Date   Diabetes mellitus without complication ([place])    Hypertension    Past Surgical History:  Procedure Laterality Date   CESAREAN SECTION     CHOLECYSTECTOMY     COLONOSCOPY WITH PROPOFOL N/A 12/10/2019   Procedure: COLONOSCOPY WITH PROPOFOL;  Surgeon: [name], MD;  Location: [facility] ENDOSCOPY;  Service: Gastroenterology;  Laterality: N/A;   COLONOSCOPY WITH PROPOFOL N/A 12/24/2019   Procedure: COLONOSCOPY WITH PROPOFOL;  Surgeon: [name], MD;  Location: [facility] ENDOSCOPY;  Service: Gastroenterology;  Laterality: N/A;   FRACTURE SURGERY     Patient Active Problem List   Diagnosis Date Noted   Hyperlipidemia associated with type 2 diabetes mellitus ([place]) 09/22/2021   Palpitations 03/19/2020   Diarrhea 03/19/2020   Premature atrial contractions 08/20/2018   Morbid (severe) obesity due to excess calories ([place]) 08/01/2018   Shortness of breath 07/26/2018   Chronic cough 03/22/2018   Diabetes mellitus without complication ([place]) 35/36/1443   Hypertension 03/22/2018    PCP: Jon Billings, NP   REFERRING PROVIDER: Katherine Mantle, MD  REFERRING DIAG:  Diagnosis  S32.15XD (ICD-10-CM) - Type 2 fracture of sacrum, subsequent encounter for fracture with routine healing  S32.422D (ICD-10-CM) - Displaced fracture of posterior wall of left acetabulum, subsequent encounter for fracture with routine healing    Rationale for  Evaluation and Treatment Rehabilitation  THERAPY DIAG:  Stiffness of left hip joint  Other low back pain  Gait difficulty  Pain in left hip  Balance problems  Muscle weakness (generalized)  ONSET DATE: 05/28/21  SUBJECTIVE:                                                                                                                                                                                           SUBJECTIVE STATEMENT: Pt. Reports no new complaints.  Pt. Had a good day at church on Sunday.  No pain reports prior to tx. Session.       PERTINENT HISTORY:  Hx of L hip dislocation, and screws  placed in lower back after her injury. Pt left hospital in a walker, and utilized a WC originally during [facility]. Pt has decreased ankle DF in L ankle, and pain in her lateral foot / 5th metatarsal. Pt had L knee scope, and has tenderness/bruising in L LE along tibia.  PAIN:  Are you having pain? NPRS scale: 0/10 Pain location: hip, back, legs Pain description: popping and dull/achy Aggravating factors: standing, bending forward (especially left) Relieving factors: sitting down, tylenol PRN   PRECAUTIONS: Anterior hip, Posterior hip, Knee, and Fall  WEIGHT BEARING RESTRICTIONS no  FALLS:  Has patient fallen in last 6 months? Yes. Number of falls 1  LIVING ENVIRONMENT: Lives with: lives with their family Lives in: House/apartment Stairs: yes - 2 to get into home with raining Has following equipment at home: Quad cane large base and Wheelchair (manual)  OCCUPATION:  Pt is currently on long term disability. Previously worked at [employer] prior to injury. Would like to get back to work if the company can find a position that is more stationary.  PLOF: Independent  PATIENT GOALS Pt wants to alleviate pain, become more mobile, strengthen back musculature, strengthen LEs   OBJECTIVE:   DIAGNOSTIC FINDINGS:  LUMBAR SPINE - 2-3 VIEW   COMPARISON:  MRI 09/28/2009    FINDINGS: 4-5 mm retrolisthesis L5-S1, slightly progressive since prior examination, possibly degenerative in nature. Otherwise normal lumbar lordosis. No acute fracture of the lumbar spine. Vertebral body height is preserved. Intervertebral disc space narrowing and endplate remodeling at Q2-2 and L5-S1 is in keeping with mild degenerative disc disease at these levels. Paraspinal soft tissues are unremarkable. Right sacroiliac arthrodesis has been performed with 2 partially threaded screws.   IMPRESSION: No acute fracture or traumatic listhesis.   Degenerative changes L4-S1 with 5 mm retrolisthesis L5-S1.     Electronically Signed   By: Fidela Salisbury M.D.   On: 09/17/2021 22:13  PATIENT SURVEYS:  FOTO 52 ; 61  SCREENING FOR RED FLAGS: Bowel or bladder incontinence: No Spinal tumors: No Cauda equina syndrome: No Compression fracture: No Abdominal aneurysm: No  COGNITION:  Overall cognitive status: Within functional limits for tasks assessed     SENSATION: Not tested - patient reports altered sensation in L LE/foot  POSTURE: rounded shoulders, forward head, and weight shift left  PALPATION: Moderate L medial lower leg tenderness with palpation  LUMBAR ROM:   Active  AROM  eval  Flexion 50% limited (compensation to R)  Extension 75% limited (pain)  Right lateral flexion   Left lateral flexion   Right rotation 50% limited  Left rotation 25% limited   (Blank rows = not tested)  LOWER EXTREMITY ROM:     PROM/AROM Right eval Left eval  Hip flexion WFL 119/ 90 deg.  Hip extension    Hip abduction [facility]  Hip adduction    Hip internal rotation    Hip external rotation    Knee flexion WFL 106/ 99 deg.  Knee extension    Ankle dorsiflexion WFL Limited by pain  Ankle plantarflexion WFL Limited by pain  Ankle inversion    Ankle eversion     (Blank rows = not tested)  LOWER EXTREMITY MMT:    MMT Right eval Left eval  Hip flexion 4/5 3/5  Hip  extension    Hip abduction 4/5 3/5  Hip adduction 4/5 4/5  Hip internal rotation 5/5 unable  Hip external rotation 5/5 3/5  Knee flexion 5/5 3/5 (pain)  Knee extension  5/5 4/5  Ankle dorsiflexion 4+/5 3/5  Ankle plantarflexion 5/5 4/5  Ankle inversion    Ankle eversion     (Blank rows = not tested)  FUNCTIONAL TESTS:  5 times sit to stand: 25.4 seconds  GAIT: Distance walked: in clinic Assistive device utilized: Quad cane small base Level of assistance: Modified independence Comments: R sided Trendelenburg.  L antalgic gait pattern with decrease stance on L with short R LE step length/ shuffling.  No arm swing. Walks with wide base of support, with mild supination of B feet. Pt had limited endurance, and significantly limited L hip flexion and DF to clear her foot during swing phase.    TX:  01/25/22:   5xSTS: 15.94 sec./ 14.94 sec.  (Marked improvement).    Step ups/downs at 6" step with UE assist.  Focus on hip/knee flexion and proper step pattern.  L/R 10x each.   Nustep L4 10 min. B UE/LE (discussed weekend activities).    4# ankle wts.: seated LAQ (fatigue)/ heel raises 20x.  Standing hip abduction/ hamstring curls/ marching with 6" step touches with 4# ankle weight on R LE and 2# weight on L LE. 2 sets of 10 x per LE at stairs (20 x total per LE).  Walking in hallway 2 laps (70 feet/ lap) and outside to car with use of SPC.  Pt. Demonstrates gait without SPC with marked increase in antalgia.      01/13/22:  There.ex.:  Nustep L4 10 min. B UE/LE (discussed compression stockings/ HEP).    Step ups/downs at 6" step with UE assist.  Focus on hip/knee flexion and proper step pattern.  L/R 10x each.  Walking in clinic with use of SPC and consistent 2-point gait pattern with increase L hip/knee flexion and step pattern.   Supine SLR/heel slides/ hip abduction 20x.  Marked increase in L knee flexion 118 deg. Active (discomfort reported). STS from mat table with proper  technique     01/11/22:  Discussed trip to [place]/ walking.      There.ex.:  Nustep L4 10 min. B UE/LE.    Standing L/R step touches at 6" step (stairs).  Standing heel/toe raises/ hip abduction/ extension 20x.  B UE assist for safety/ balance in //-bars.    Walking in clinic with use of SPC and 2-point gait pattern.  L antalgic/ stiff gait noted   Walking marching.  Reviewed HEP.    12/28/21:  Standing hip abduction with 4# ankle weight on R LE - no weight on L LE. 2 sets of 10 x per LE in // bars (20 x total per LE)  Forward stepping up onto 6" step in // bars, followed by descending forward off of step X 3 with L foot first, then x 3 with R foot first. Intermittent need and use of SUE support.  Backwards stepping back up onto 6" step, followed by stepping off of the step backwards onto the ground x 3 with L foot first, then x 3 with R foot first. Intermittent need and use of SUE support.  Supine on blue mat table with red wedge under back for support:    SLR 20 x per LE - 2 sets    L Hip abduction with straight leg in R side lying - 20 x 2 sets - min assist of L LE for 1st set , independent for 2nd set    R Hip abduction with straight leg in L side lying - 20 x 2  sets - independent for both sets    Bridging 20 x - 2 sets ( 1 sec hold)    Bridging 5 x w/ 5s hold with TA engagement  Scifit - 8 minutes (pt did not enjoy due to L foot positioning)  Pt education/emphasis on ambulating through the home independently, especially for short distances   PATIENT EDUCATION:  Education details: Access Code: [email or web address] Person educated: Patient Education method: Explanation, Demonstration, and Handouts Education comprehension: verbalized understanding and returned demonstration   HOME EXERCISE PROGRAM: Access Code: [email or web address] URL: [email or web address] Date: 11/25/2021 Prepared by: Dorcas Carrow  Exercises - Straight Leg Raise  - 1 x daily - 7 x weekly - 2  sets - 10 reps - Supine Heel Slide  - 1 x daily - 7 x weekly - 2 sets - 10 reps - Supine Hip Adduction Isometric with Ball  - 1 x daily - 7 x weekly - 2 sets - 10 reps - Hooklying Gluteal Sets  - 1 x daily - 7 x weekly - 2 sets - 10 reps - Seated Heel Raise  - 1 x daily - 7 x weekly - 2 sets - 10 reps - Seated Heel Toe Raises  - 1 x daily - 7 x weekly - 2 sets - 10 reps  ASSESSMENT:  CLINICAL IMPRESSION: Pt continues to ambulate with L antalgic gait secondary to hip weakness/ ankle limitations.  Pt. Safe/ cautious with walking and benefits from assistive device. Pt. Able to complete step ups but requires B UE assist on handrails for safety.  Marked increase in L knee flexion 118 deg. Since initial evaluation.  Pt's biggest goal is to exhibit a typical heel strike on L LE during ambulation, which requires strengthening of L hip flexors. Pt will benefit from further skilled PT in order to strengthen her LE's, and to progress her gait mechanics.   OBJECTIVE IMPAIRMENTS Abnormal gait, decreased activity tolerance, decreased balance, decreased endurance, decreased mobility, difficulty walking, decreased ROM, decreased strength, hypomobility, impaired flexibility, impaired sensation, improper body mechanics, postural dysfunction, obesity, and pain.   ACTIVITY LIMITATIONS carrying, lifting, bending, sitting, standing, squatting, sleeping, stairs, transfers, bed mobility, dressing, and locomotion level  PARTICIPATION LIMITATIONS: cleaning, laundry, driving, shopping, community activity, occupation, and yard work  PERSONAL FACTORS Age, Education, and Past/current experiences are also affecting patient's functional outcome.   REHAB POTENTIAL: Good  CLINICAL DECISION MAKING: Evolving/moderate complexity  EVALUATION COMPLEXITY: Moderate   GOALS: Goals reviewed with patient? Yes  SHORT TERM GOALS: Target date: 02/22/2022  Pt will ambulate through the clinic with a symmetrical gait pattern, with  equal step length and decreased pain for > 100 ft.  Baseline: Asymmetrical gait - increased L step length and decreased R step length Goal status: Partially met   LONG TERM GOALS: Target date: 03/22/2022  Pt will increase FOTO score to > 58 Baseline:  Goal status: On-going  2.  Pt will decrease pain level to < 2/10 during all ADLs, in order to efficiently accomplish activities with decreased pain and increased independence Baseline:  Goal status: Not met  3.  Pt will increase Active L knee flexion ROM to > 120 degrees  Baseline: 99, 106 passive.  8/8: 118 deg.   Goal status: On-going    PLAN: PT FREQUENCY: 2x/week  PT DURATION: 4 weeks  PLANNED INTERVENTIONS: Therapeutic exercises, Therapeutic activity, Neuromuscular re-education, Balance training, Gait training, Patient/Family education, Joint mobilization, Stair training, Aquatic Therapy, Cryotherapy, Moist heat, and Manual therapy.  PLAN FOR NEXT SESSION: Update  HEP, strengthen L LE and Hip flexion / hip abduction and stretch L iliopsoas. Gait training. Endurance emphasis.   Pura Spice, PT, DPT # [phone] 01/25/2022, 9:47 AM

## 2022-01-26 LAB — HM DIABETES EYE EXAM

## 2022-01-27 ENCOUNTER — Ambulatory Visit: Payer: BC Managed Care – PPO | Admitting: Physical Therapy

## 2022-01-27 DIAGNOSIS — R269 Unspecified abnormalities of gait and mobility: Secondary | ICD-10-CM

## 2022-01-27 DIAGNOSIS — M25652 Stiffness of left hip, not elsewhere classified: Secondary | ICD-10-CM

## 2022-01-27 DIAGNOSIS — M25552 Pain in left hip: Secondary | ICD-10-CM

## 2022-01-27 DIAGNOSIS — R2689 Other abnormalities of gait and mobility: Secondary | ICD-10-CM

## 2022-01-27 DIAGNOSIS — M5459 Other low back pain: Secondary | ICD-10-CM

## 2022-01-27 DIAGNOSIS — M6281 Muscle weakness (generalized): Secondary | ICD-10-CM

## 2022-01-28 NOTE — Therapy (Signed)
OUTPATIENT PHYSICAL THERAPY THORACOLUMBAR TREATMENT   Patient Name: Yesenia Stevens MRN: [id number] DOB:[birth year], 56 y.o., female Today's Date: 01/28/2022   PT End of Session - 01/25/22 0946     Visit Number 13    Number of Visits 20    Date for PT Re-Evaluation 02/22/22    PT Start Time 3762 to 8315 (45 minutes)    Equipment Utilized During Treatment Gait belt    Activity Tolerance Patient limited by pain;Patient limited by fatigue    Behavior During Therapy [facility] for tasks assessed/performed          Past Medical History:  Diagnosis Date   Diabetes mellitus without complication ([place])    Hypertension    Past Surgical History:  Procedure Laterality Date   CESAREAN SECTION     CHOLECYSTECTOMY     COLONOSCOPY WITH PROPOFOL N/A 12/10/2019   Procedure: COLONOSCOPY WITH PROPOFOL;  Surgeon: [name], MD;  Location: [facility] ENDOSCOPY;  Service: Gastroenterology;  Laterality: N/A;   COLONOSCOPY WITH PROPOFOL N/A 12/24/2019   Procedure: COLONOSCOPY WITH PROPOFOL;  Surgeon: [name], MD;  Location: [facility] ENDOSCOPY;  Service: Gastroenterology;  Laterality: N/A;   FRACTURE SURGERY     Patient Active Problem List   Diagnosis Date Noted   Hyperlipidemia associated with type 2 diabetes mellitus ([place]) 09/22/2021   Palpitations 03/19/2020   Diarrhea 03/19/2020   Premature atrial contractions 08/20/2018   Morbid (severe) obesity due to excess calories ([place]) 08/01/2018   Shortness of breath 07/26/2018   Chronic cough 03/22/2018   Diabetes mellitus without complication ([place]) 17/61/6073   Hypertension 03/22/2018    PCP: Jon Billings, NP   REFERRING PROVIDER: Katherine Mantle, MD  REFERRING DIAG:  Diagnosis  S32.15XD (ICD-10-CM) - Type 2 fracture of sacrum, subsequent encounter for fracture with routine healing  S32.422D (ICD-10-CM) - Displaced fracture of posterior wall of left acetabulum, subsequent encounter for fracture with routine healing    Rationale for  Evaluation and Treatment Rehabilitation  THERAPY DIAG:  Stiffness of left hip joint  Other low back pain  Gait difficulty  Pain in left hip  Balance problems  Muscle weakness (generalized)  ONSET DATE: 05/28/21  SUBJECTIVE:                                                                                                                                                                                           SUBJECTIVE STATEMENT: Pt. Reports no falls or new complaints.  Pt. Entered PT with use of SPC and recip. Gait pattern.    PERTINENT HISTORY:  Hx of L hip dislocation, and screws placed in lower back after her injury. Pt  left hospital in a walker, and utilized a WC originally during [facility]. Pt has decreased ankle DF in L ankle, and pain in her lateral foot / 5th metatarsal. Pt had L knee scope, and has tenderness/bruising in L LE along tibia.  PAIN:  Are you having pain? NPRS scale: 0/10 Pain location: hip, back, legs Pain description: popping and dull/achy Aggravating factors: standing, bending forward (especially left) Relieving factors: sitting down, tylenol PRN   PRECAUTIONS: Anterior hip, Posterior hip, Knee, and Fall  WEIGHT BEARING RESTRICTIONS no  FALLS:  Has patient fallen in last 6 months? Yes. Number of falls 1  LIVING ENVIRONMENT: Lives with: lives with their family Lives in: House/apartment Stairs: yes - 2 to get into home with raining Has following equipment at home: Quad cane large base and Wheelchair (manual)  OCCUPATION:  Pt is currently on long term disability. Previously worked at [employer] prior to injury. Would like to get back to work if the company can find a position that is more stationary.  PLOF: Independent  PATIENT GOALS Pt wants to alleviate pain, become more mobile, strengthen back musculature, strengthen LEs   OBJECTIVE:   DIAGNOSTIC FINDINGS:  LUMBAR SPINE - 2-3 VIEW   COMPARISON:  MRI 09/28/2009   FINDINGS: 4-5 mm  retrolisthesis L5-S1, slightly progressive since prior examination, possibly degenerative in nature. Otherwise normal lumbar lordosis. No acute fracture of the lumbar spine. Vertebral body height is preserved. Intervertebral disc space narrowing and endplate remodeling at Y8-0 and L5-S1 is in keeping with mild degenerative disc disease at these levels. Paraspinal soft tissues are unremarkable. Right sacroiliac arthrodesis has been performed with 2 partially threaded screws.   IMPRESSION: No acute fracture or traumatic listhesis.   Degenerative changes L4-S1 with 5 mm retrolisthesis L5-S1.     Electronically Signed   By: Fidela Salisbury M.D.   On: 09/17/2021 22:13  PATIENT SURVEYS:  FOTO 52 ; 61  SCREENING FOR RED FLAGS: Bowel or bladder incontinence: No Spinal tumors: No Cauda equina syndrome: No Compression fracture: No Abdominal aneurysm: No  COGNITION:  Overall cognitive status: Within functional limits for tasks assessed     SENSATION: Not tested - patient reports altered sensation in L LE/foot  POSTURE: rounded shoulders, forward head, and weight shift left  PALPATION: Moderate L medial lower leg tenderness with palpation  LUMBAR ROM:   Active  AROM  eval  Flexion 50% limited (compensation to R)  Extension 75% limited (pain)  Right lateral flexion   Left lateral flexion   Right rotation 50% limited  Left rotation 25% limited   (Blank rows = not tested)  LOWER EXTREMITY ROM:     PROM/AROM Right eval Left eval  Hip flexion WFL 119/ 90 deg.  Hip extension    Hip abduction [facility]  Hip adduction    Hip internal rotation    Hip external rotation    Knee flexion WFL 106/ 99 deg.  Knee extension    Ankle dorsiflexion WFL Limited by pain  Ankle plantarflexion WFL Limited by pain  Ankle inversion    Ankle eversion     (Blank rows = not tested)  LOWER EXTREMITY MMT:    MMT Right eval Left eval  Hip flexion 4/5 3/5  Hip extension    Hip abduction  4/5 3/5  Hip adduction 4/5 4/5  Hip internal rotation 5/5 unable  Hip external rotation 5/5 3/5  Knee flexion 5/5 3/5 (pain)  Knee extension 5/5 4/5  Ankle dorsiflexion 4+/5 3/5  Ankle plantarflexion 5/5 4/5  Ankle inversion    Ankle eversion     (Blank rows = not tested)  FUNCTIONAL TESTS:  5 times sit to stand: 25.4 seconds  GAIT: Distance walked: in clinic Assistive device utilized: Quad cane small base Level of assistance: Modified independence Comments: R sided Trendelenburg.  L antalgic gait pattern with decrease stance on L with short R LE step length/ shuffling.  No arm swing. Walks with wide base of support, with mild supination of B feet. Pt had limited endurance, and significantly limited L hip flexion and DF to clear her foot during swing phase.    TX:  01/27/22:  There.ex.:  Walking in clinic with use of SPC and consistent 2-point gait pattern with increase L hip/knee flexion and step pattern.  2 laps in hallway with SBA/ mod. I for cuing and safety.    Ascending/descending stairs with recip. Pattern and handrail assist.  Maintaining midline LE position.   Nustep L5 10 min. B UE/LE (increase resistance today)- no UE for last couple minutes.    Supine SLR/heel slides/ hip abduction/ pelvic tiles/ bolster bridging and SAQ 20x.   STS from mat table with proper technique    01/25/22:   5xSTS: 15.94 sec./ 14.94 sec.  (Marked improvement).    Step ups/downs at 6" step with UE assist.  Focus on hip/knee flexion and proper step pattern.  L/R 10x each.   Nustep L4 10 min. B UE/LE (discussed weekend activities).    4# ankle wts.: seated LAQ (fatigue)/ heel raises 20x.  Standing hip abduction/ hamstring curls/ marching with 6" step touches with 4# ankle weight on R LE and 2# weight on L LE. 2 sets of 10 x per LE at stairs (20 x total per LE).  Walking in hallway 2 laps (70 feet/ lap) and outside to car with use of SPC.  Pt. Demonstrates gait without SPC with marked  increase in antalgia.      01/13/22:  There.ex.:  Nustep L4 10 min. B UE/LE (discussed compression stockings/ HEP).    Step ups/downs at 6" step with UE assist.  Focus on hip/knee flexion and proper step pattern.  L/R 10x each.  Walking in clinic with use of SPC and consistent 2-point gait pattern with increase L hip/knee flexion and step pattern.   Supine SLR/heel slides/ hip abduction 20x.  Marked increase in L knee flexion 118 deg. Active (discomfort reported). STS from mat table with proper technique     01/11/22:  Discussed trip to [place]/ walking.      There.ex.:  Nustep L4 10 min. B UE/LE.    Standing L/R step touches at 6" step (stairs).  Standing heel/toe raises/ hip abduction/ extension 20x.  B UE assist for safety/ balance in //-bars.    Walking in clinic with use of SPC and 2-point gait pattern.  L antalgic/ stiff gait noted   Walking marching.  Reviewed HEP.    12/28/21:  Standing hip abduction with 4# ankle weight on R LE - no weight on L LE. 2 sets of 10 x per LE in // bars (20 x total per LE)  Forward stepping up onto 6" step in // bars, followed by descending forward off of step X 3 with L foot first, then x 3 with R foot first. Intermittent need and use of SUE support.  Backwards stepping back up onto 6" step, followed by stepping off of the step backwards onto the ground x 3 with L  foot first, then x 3 with R foot first. Intermittent need and use of SUE support.  Supine on blue mat table with red wedge under back for support:    SLR 20 x per LE - 2 sets    L Hip abduction with straight leg in R side lying - 20 x 2 sets - min assist of L LE for 1st set , independent for 2nd set    R Hip abduction with straight leg in L side lying - 20 x 2 sets - independent for both sets    Bridging 20 x - 2 sets ( 1 sec hold)    Bridging 5 x w/ 5s hold with TA engagement  Scifit - 8 minutes (pt did not enjoy due to L foot positioning)  Pt education/emphasis  on ambulating through the home independently, especially for short distances   PATIENT EDUCATION:  Education details: Access Code: [email or web address] Person educated: Patient Education method: Explanation, Demonstration, and Handouts Education comprehension: verbalized understanding and returned demonstration   HOME EXERCISE PROGRAM: Access Code: [email or web address] URL: [email or web address] Date: 11/25/2021 Prepared by: Dorcas Carrow  Exercises - Straight Leg Raise  - 1 x daily - 7 x weekly - 2 sets - 10 reps - Supine Heel Slide  - 1 x daily - 7 x weekly - 2 sets - 10 reps - Supine Hip Adduction Isometric with Ball  - 1 x daily - 7 x weekly - 2 sets - 10 reps - Hooklying Gluteal Sets  - 1 x daily - 7 x weekly - 2 sets - 10 reps - Seated Heel Raise  - 1 x daily - 7 x weekly - 2 sets - 10 reps - Seated Heel Toe Raises  - 1 x daily - 7 x weekly - 2 sets - 10 reps  ASSESSMENT:  CLINICAL IMPRESSION: Pt. continues to ambulate with L antalgic gait secondary to hip weakness/ ankle limitations.  No LOB and pt. Safe with step pattern.  Cuing to correct posture/ step length occasionally while walking in hallway.  Pt. Able to complete step ups but requires B UE assist on handrails for safety.  Pt. Improving walking endurance and more consistent step pattern.  Pt's biggest goal is to exhibit a typical heel strike on L LE during ambulation, which requires strengthening of L hip flexors. Pt will benefit from further skilled PT in order to strengthen her LE's, and to progress her gait mechanics.   OBJECTIVE IMPAIRMENTS Abnormal gait, decreased activity tolerance, decreased balance, decreased endurance, decreased mobility, difficulty walking, decreased ROM, decreased strength, hypomobility, impaired flexibility, impaired sensation, improper body mechanics, postural dysfunction, obesity, and pain.   ACTIVITY LIMITATIONS carrying, lifting, bending, sitting, standing, squatting, sleeping, stairs, transfers,  bed mobility, dressing, and locomotion level  PARTICIPATION LIMITATIONS: cleaning, laundry, driving, shopping, community activity, occupation, and yard work  PERSONAL FACTORS Age, Education, and Past/current experiences are also affecting patient's functional outcome.   REHAB POTENTIAL: Good  CLINICAL DECISION MAKING: Evolving/moderate complexity  EVALUATION COMPLEXITY: Moderate   GOALS: Goals reviewed with patient? Yes  SHORT TERM GOALS: Target date: 02/22/22  Pt will ambulate through the clinic with a symmetrical gait pattern, with equal step length and decreased pain for > 100 ft.  Baseline: Asymmetrical gait - increased L step length and decreased R step length Goal status: Partially met   LONG TERM GOALS: Target date: 03/22/22  Pt will increase FOTO score to > 58 Baseline:  Goal status: On-going  2.  Pt will decrease pain level  to < 2/10 during all ADLs, in order to efficiently accomplish activities with decreased pain and increased independence Baseline:  Goal status: Not met  3.  Pt will increase Active L knee flexion ROM to > 120 degrees  Baseline: 99, 106 passive.  8/8: 118 deg.   Goal status: On-going    PLAN: PT FREQUENCY: 2x/week  PT DURATION: 4 weeks  PLANNED INTERVENTIONS: Therapeutic exercises, Therapeutic activity, Neuromuscular re-education, Balance training, Gait training, Patient/Family education, Joint mobilization, Stair training, Aquatic Therapy, Cryotherapy, Moist heat, and Manual therapy.  PLAN FOR NEXT SESSION: Update HEP, strengthen L LE and Hip flexion / hip abduction and stretch L iliopsoas. Gait training. Endurance emphasis.   Pura Spice, PT, DPT # [phone] 01/28/2022, 10:08 AM

## 2022-02-01 ENCOUNTER — Ambulatory Visit: Payer: BC Managed Care – PPO | Admitting: Physical Therapy

## 2022-02-01 DIAGNOSIS — M25552 Pain in left hip: Secondary | ICD-10-CM

## 2022-02-01 DIAGNOSIS — M25652 Stiffness of left hip, not elsewhere classified: Secondary | ICD-10-CM | POA: Diagnosis not present

## 2022-02-01 DIAGNOSIS — R269 Unspecified abnormalities of gait and mobility: Secondary | ICD-10-CM

## 2022-02-01 DIAGNOSIS — M5459 Other low back pain: Secondary | ICD-10-CM

## 2022-02-01 DIAGNOSIS — R2689 Other abnormalities of gait and mobility: Secondary | ICD-10-CM

## 2022-02-01 DIAGNOSIS — M6281 Muscle weakness (generalized): Secondary | ICD-10-CM

## 2022-02-01 NOTE — Therapy (Signed)
OUTPATIENT PHYSICAL THERAPY THORACOLUMBAR TREATMENT   Patient Name: Yesenia Stevens MRN: [id number] DOB:[birth year], 56 y.o., female Today's Date: 02/02/2022   PT End of Session -     Visit Number 14    Number of Visits 20    Date for PT Re-Evaluation 02/22/22    PT Start Time 0946 to 1033  (47 minutes)    Equipment Utilized During Treatment No gait belt today   Activity Tolerance Patient limited by pain;Patient limited by fatigue    Behavior During Therapy [facility] for tasks assessed/performed          Past Medical History:  Diagnosis Date   Diabetes mellitus without complication (HCC)    Hypertension    Past Surgical History:  Procedure Laterality Date   CESAREAN SECTION     CHOLECYSTECTOMY     COLONOSCOPY WITH PROPOFOL N/A 12/10/2019   Procedure: COLONOSCOPY WITH PROPOFOL;  Surgeon: Wyline Mood, MD;  Location: [facility] ENDOSCOPY;  Service: Gastroenterology;  Laterality: N/A;   COLONOSCOPY WITH PROPOFOL N/A 12/24/2019   Procedure: COLONOSCOPY WITH PROPOFOL;  Surgeon: Wyline Mood, MD;  Location: [facility] ENDOSCOPY;  Service: Gastroenterology;  Laterality: N/A;   FRACTURE SURGERY     Patient Active Problem List   Diagnosis Date Noted   Hyperlipidemia associated with type 2 diabetes mellitus (HCC) 09/22/2021   Palpitations 03/19/2020   Diarrhea 03/19/2020   Premature atrial contractions 08/20/2018   Morbid (severe) obesity due to excess calories (HCC) 08/01/2018   Shortness of breath 07/26/2018   Chronic cough 03/22/2018   Diabetes mellitus without complication (HCC) 03/22/2018   Hypertension 03/22/2018    PCP: Larae Grooms, NP   REFERRING PROVIDER: Ernst Bowler, MD  REFERRING DIAG:  Diagnosis  S32.15XD (ICD-10-CM) - Type 2 fracture of sacrum, subsequent encounter for fracture with routine healing  S32.422D (ICD-10-CM) - Displaced fracture of posterior wall of left acetabulum, subsequent encounter for fracture with routine healing    Rationale for Evaluation and  Treatment Rehabilitation  THERAPY DIAG:  Stiffness of left hip joint  Other low back pain  Gait difficulty  Pain in left hip  Balance problems  Muscle weakness (generalized)  ONSET DATE: 05/28/21  SUBJECTIVE:                                                                                                                                                                                           SUBJECTIVE STATEMENT: 02/01/22:  Pt. Reports no falls and states L hip is hurting today.  Pt. Entered PT with use of SPC and recip. Gait pattern.  Cuing to decrease time for R hip swing through phase of gait.  PERTINENT HISTORY:  Hx of L hip dislocation, and screws placed in lower back after her injury. Pt left hospital in a walker, and utilized a WC originally during [facility] Pt has decreased ankle DF in L ankle, and pain in her lateral foot / 5th metatarsal. Pt had L knee scope, and has tenderness/bruising in L LE along tibia.  PAIN:  Are you having pain? NPRS scale: 0/10 Pain location: hip, back, legs Pain description: popping and dull/achy Aggravating factors: standing, bending forward (especially left) Relieving factors: sitting down, tylenol PRN   PRECAUTIONS: Anterior hip, Posterior hip, Knee, and Fall  WEIGHT BEARING RESTRICTIONS no  FALLS:  Has patient fallen in last 6 months? Yes. Number of falls 1  LIVING ENVIRONMENT: Lives with: lives with their family Lives in: House/apartment Stairs: yes - 2 to get into home with raining Has following equipment at home: Quad cane large base and Wheelchair (manual)  OCCUPATION:  Pt is currently on long term disability. Previously worked at [employer] prior to injury. Would like to get back to work if the company can find a position that is more stationary.  PLOF: Independent  PATIENT GOALS Pt wants to alleviate pain, become more mobile, strengthen back musculature, strengthen LEs   OBJECTIVE:   DIAGNOSTIC FINDINGS:  LUMBAR  SPINE - 2-3 VIEW   COMPARISON:  MRI 09/28/2009   FINDINGS: 4-5 mm retrolisthesis L5-S1, slightly progressive since prior examination, possibly degenerative in nature. Otherwise normal lumbar lordosis. No acute fracture of the lumbar spine. Vertebral body height is preserved. Intervertebral disc space narrowing and endplate remodeling at V0-3 and L5-S1 is in keeping with mild degenerative disc disease at these levels. Paraspinal soft tissues are unremarkable. Right sacroiliac arthrodesis has been performed with 2 partially threaded screws.   IMPRESSION: No acute fracture or traumatic listhesis.   Degenerative changes L4-S1 with 5 mm retrolisthesis L5-S1.     Electronically Signed   By: Fidela Salisbury M.D.   On: 09/17/2021 22:13  PATIENT SURVEYS:  FOTO 52 ; 61  SCREENING FOR RED FLAGS: Bowel or bladder incontinence: No Spinal tumors: No Cauda equina syndrome: No Compression fracture: No Abdominal aneurysm: No  COGNITION:  Overall cognitive status: Within functional limits for tasks assessed     SENSATION: Not tested - patient reports altered sensation in L LE/foot  POSTURE: rounded shoulders, forward head, and weight shift left  PALPATION: Moderate L medial lower leg tenderness with palpation  LUMBAR ROM:   Active  AROM  eval  Flexion 50% limited (compensation to R)  Extension 75% limited (pain)  Right lateral flexion   Left lateral flexion   Right rotation 50% limited  Left rotation 25% limited   (Blank rows = not tested)  LOWER EXTREMITY ROM:     PROM/AROM Right eval Left eval  Hip flexion WFL 119/ 90 deg.  Hip extension    Hip abduction [facility]  Hip adduction    Hip internal rotation    Hip external rotation    Knee flexion WFL 106/ 99 deg.  Knee extension    Ankle dorsiflexion WFL Limited by pain  Ankle plantarflexion WFL Limited by pain  Ankle inversion    Ankle eversion     (Blank rows = not tested)  LOWER EXTREMITY MMT:    MMT  Right eval Left eval  Hip flexion 4/5 3/5  Hip extension    Hip abduction 4/5 3/5  Hip adduction 4/5 4/5  Hip internal rotation 5/5 unable  Hip external rotation 5/5  3/5  Knee flexion 5/5 3/5 (pain)  Knee extension 5/5 4/5  Ankle dorsiflexion 4+/5 3/5  Ankle plantarflexion 5/5 4/5  Ankle inversion    Ankle eversion     (Blank rows = not tested)  FUNCTIONAL TESTS:  5 times sit to stand: 25.4 seconds  GAIT: Distance walked: in clinic Assistive device utilized: Quad cane small base Level of assistance: Modified independence Comments: R sided Trendelenburg.  L antalgic gait pattern with decrease stance on L with short R LE step length/ shuffling.  No arm swing. Walks with wide base of support, with mild supination of B feet. Pt had limited endurance, and significantly limited L hip flexion and DF to clear her foot during swing phase.    TX:  02/01/22:  There.ex.:   Nustep L5 8 min. B UE/LE (increase resistance today)- L hip pain/ pinching.     6"/12" step touches 10x each with B UE assist for safety.     Walking in //-bars: alt. UE/LE touches with mirror feedback 2 laps.  Walking in //-bars with Airex step ups/downs/ overs.    Walking in clinic with use of SPC and consistent 2-point gait pattern with increase L hip/knee flexion and step pattern.  2 laps in hallway with SBA/ mod. I for cuing and safety.    Supine L hip/ hamstring stretches (6 min.).  Supine SLR/heel slides/ hip abduction/ pelvic tiles 20x.   STS from mat table with proper technique    01/25/22:   5xSTS: 15.94 sec./ 14.94 sec.  (Marked improvement).    Step ups/downs at 6" step with UE assist.  Focus on hip/knee flexion and proper step pattern.  L/R 10x each.   Nustep L4 10 min. B UE/LE (discussed weekend activities).    4# ankle wts.: seated LAQ (fatigue)/ heel raises 20x.  Standing hip abduction/ hamstring curls/ marching with 6" step touches with 4# ankle weight on R LE and 2# weight on L LE. 2 sets of  10 x per LE at stairs (20 x total per LE).  Walking in hallway 2 laps (70 feet/ lap) and outside to car with use of SPC.  Pt. Demonstrates gait without SPC with marked increase in antalgia.     01/13/22:  There.ex.:  Nustep L4 10 min. B UE/LE (discussed compression stockings/ HEP).    Step ups/downs at 6" step with UE assist.  Focus on hip/knee flexion and proper step pattern.  L/R 10x each.  Walking in clinic with use of SPC and consistent 2-point gait pattern with increase L hip/knee flexion and step pattern.   Supine SLR/heel slides/ hip abduction 20x.  Marked increase in L knee flexion 118 deg. Active (discomfort reported). STS from mat table with proper technique    01/11/22:  Discussed trip to [place]/ walking.      There.ex.:  Nustep L4 10 min. B UE/LE.    Standing L/R step touches at 6" step (stairs).  Standing heel/toe raises/ hip abduction/ extension 20x.  B UE assist for safety/ balance in //-bars.    Walking in clinic with use of SPC and 2-point gait pattern.  L antalgic/ stiff gait noted   Walking marching.  Reviewed HEP.   12/28/21:  Standing hip abduction with 4# ankle weight on R LE - no weight on L LE. 2 sets of 10 x per LE in // bars (20 x total per LE)  Forward stepping up onto 6" step in // bars, followed by descending forward off of step X  3 with L foot first, then x 3 with R foot first. Intermittent need and use of SUE support.  Backwards stepping back up onto 6" step, followed by stepping off of the step backwards onto the ground x 3 with L foot first, then x 3 with R foot first. Intermittent need and use of SUE support.  Supine on blue mat table with red wedge under back for support:    SLR 20 x per LE - 2 sets    L Hip abduction with straight leg in R side lying - 20 x 2 sets - min assist of L LE for 1st set , independent for 2nd set    R Hip abduction with straight leg in L side lying - 20 x 2 sets - independent for both sets    Bridging 20 x  - 2 sets ( 1 sec hold)    Bridging 5 x w/ 5s hold with TA engagement  Scifit - 8 minutes (pt did not enjoy due to L foot positioning)  Pt education/emphasis on ambulating through the home independently, especially for short distances   PATIENT EDUCATION:  Education details: Access Code: [email or web address] Person educated: Patient Education method: Explanation, Demonstration, and Handouts Education comprehension: verbalized understanding and returned demonstration   HOME EXERCISE PROGRAM: Access Code: [email or web address] URL: [email or web address] Date: 11/25/2021 Prepared by: Dorcas Carrow  Exercises - Straight Leg Raise  - 1 x daily - 7 x weekly - 2 sets - 10 reps - Supine Heel Slide  - 1 x daily - 7 x weekly - 2 sets - 10 reps - Supine Hip Adduction Isometric with Ball  - 1 x daily - 7 x weekly - 2 sets - 10 reps - Hooklying Gluteal Sets  - 1 x daily - 7 x weekly - 2 sets - 10 reps - Seated Heel Raise  - 1 x daily - 7 x weekly - 2 sets - 10 reps - Seated Heel Toe Raises  - 1 x daily - 7 x weekly - 2 sets - 10 reps  ASSESSMENT:  CLINICAL IMPRESSION: Pt. continues to ambulate with L antalgic gait secondary to hip weakness/ ankle limitations.  No LOB and pt. Safe with step pattern.  Cuing to correct posture/ step length occasionally while walking in hallway.  Pt. Improving walking endurance and more consistent step pattern.  Pt's biggest goal is to exhibit a typical heel strike on L LE during ambulation, which requires strengthening of L hip flexors. Pt will benefit from further skilled PT in order to strengthen her LE's, and to progress her gait mechanics.   OBJECTIVE IMPAIRMENTS Abnormal gait, decreased activity tolerance, decreased balance, decreased endurance, decreased mobility, difficulty walking, decreased ROM, decreased strength, hypomobility, impaired flexibility, impaired sensation, improper body mechanics, postural dysfunction, obesity, and pain.   ACTIVITY LIMITATIONS  carrying, lifting, bending, sitting, standing, squatting, sleeping, stairs, transfers, bed mobility, dressing, and locomotion level  PARTICIPATION LIMITATIONS: cleaning, laundry, driving, shopping, community activity, occupation, and yard work  PERSONAL FACTORS Age, Education, and Past/current experiences are also affecting patient's functional outcome.   REHAB POTENTIAL: Good  CLINICAL DECISION MAKING: Evolving/moderate complexity  EVALUATION COMPLEXITY: Moderate   GOALS: Goals reviewed with patient? Yes  SHORT TERM GOALS: Target date: 02/22/22  Pt will ambulate through the clinic with a symmetrical gait pattern, with equal step length and decreased pain for > 100 ft.  Baseline: Asymmetrical gait - increased L step length and decreased R step length Goal status: Partially met   LONG TERM  GOALS: Target date: 03/22/22  Pt will increase FOTO score to > 58 Baseline:  Goal status: On-going  2.  Pt will decrease pain level to < 2/10 during all ADLs, in order to efficiently accomplish activities with decreased pain and increased independence Baseline:  Goal status: Not met  3.  Pt will increase Active L knee flexion ROM to > 120 degrees  Baseline: 99, 106 passive.  8/8: 118 deg.   Goal status: On-going    PLAN: PT FREQUENCY: 2x/week  PT DURATION: 4 weeks  PLANNED INTERVENTIONS: Therapeutic exercises, Therapeutic activity, Neuromuscular re-education, Balance training, Gait training, Patient/Family education, Joint mobilization, Stair training, Aquatic Therapy, Cryotherapy, Moist heat, and Manual therapy.  PLAN FOR NEXT SESSION: Update HEP, strengthen L LE and Hip flexion / hip abduction and stretch L iliopsoas. Gait training. Endurance emphasis.   Pura Spice, PT, DPT # [phone] 02/02/2022, 12:41 PM

## 2022-02-03 ENCOUNTER — Ambulatory Visit: Payer: BC Managed Care – PPO | Admitting: Physical Therapy

## 2022-02-03 ENCOUNTER — Encounter: Payer: Self-pay | Admitting: Physical Therapy

## 2022-02-03 DIAGNOSIS — M25552 Pain in left hip: Secondary | ICD-10-CM

## 2022-02-03 DIAGNOSIS — M25652 Stiffness of left hip, not elsewhere classified: Secondary | ICD-10-CM | POA: Diagnosis not present

## 2022-02-03 DIAGNOSIS — M6281 Muscle weakness (generalized): Secondary | ICD-10-CM

## 2022-02-03 DIAGNOSIS — R269 Unspecified abnormalities of gait and mobility: Secondary | ICD-10-CM

## 2022-02-03 DIAGNOSIS — M5459 Other low back pain: Secondary | ICD-10-CM

## 2022-02-03 DIAGNOSIS — R2689 Other abnormalities of gait and mobility: Secondary | ICD-10-CM

## 2022-02-03 NOTE — Therapy (Signed)
OUTPATIENT PHYSICAL THERAPY THORACOLUMBAR TREATMENT   Patient Name: Yesenia Stevens MRN: [id number] DOB:April 05, 1966, 56 y.o., female Today's Date: 02/03/2022   PT End of Session -     Visit Number 15    Number of Visits 20    Date for PT Re-Evaluation 02/22/22    PT Start Time 2482 to 1116  (48 minutes)    Equipment Utilized During Treatment No gait belt today   Activity Tolerance Patient limited by pain;Patient limited by fatigue    Behavior During Therapy [facility] for tasks assessed/performed          Past Medical History:  Diagnosis Date   Diabetes mellitus without complication ([place])    Hypertension    Past Surgical History:  Procedure Laterality Date   CESAREAN SECTION     CHOLECYSTECTOMY     COLONOSCOPY WITH PROPOFOL N/A 12/10/2019   Procedure: COLONOSCOPY WITH PROPOFOL;  Surgeon: [name], MD;  Location: [facility] ENDOSCOPY;  Service: Gastroenterology;  Laterality: N/A;   COLONOSCOPY WITH PROPOFOL N/A 12/24/2019   Procedure: COLONOSCOPY WITH PROPOFOL;  Surgeon: [name], MD;  Location: [facility] ENDOSCOPY;  Service: Gastroenterology;  Laterality: N/A;   FRACTURE SURGERY     Patient Active Problem List   Diagnosis Date Noted   Hyperlipidemia associated with type 2 diabetes mellitus ([place]) 09/22/2021   Palpitations 03/19/2020   Diarrhea 03/19/2020   Premature atrial contractions 08/20/2018   Morbid (severe) obesity due to excess calories ([place]) 08/01/2018   Shortness of breath 07/26/2018   Chronic cough 03/22/2018   Diabetes mellitus without complication ([place]) 50/08/7046   Hypertension 03/22/2018    PCP: Jon Billings, NP   REFERRING PROVIDER: Katherine Mantle, MD  REFERRING DIAG:  Diagnosis  S32.15XD (ICD-10-CM) - Type 2 fracture of sacrum, subsequent encounter for fracture with routine healing  S32.422D (ICD-10-CM) - Displaced fracture of posterior wall of left acetabulum, subsequent encounter for fracture with routine healing    Rationale for Evaluation and  Treatment Rehabilitation  THERAPY DIAG:  Stiffness of left hip joint  Other low back pain  Gait difficulty  Pain in left hip  Balance problems  Muscle weakness (generalized)  ONSET DATE: 05/28/21  SUBJECTIVE:                                                                                                                                                                                           SUBJECTIVE STATEMENT: 02/03/22:  Pt. Reports no new complaints.  No pain reported prior to tx. Session.  Pt. States she has a trip to [place] planned for next Thursday to Sunday.    PERTINENT HISTORY:  Hx of L hip  dislocation, and screws placed in lower back after her injury. Pt left hospital in a walker, and utilized a WC originally during [facility]. Pt has decreased ankle DF in L ankle, and pain in her lateral foot / 5th metatarsal. Pt had L knee scope, and has tenderness/bruising in L LE along tibia.  PAIN:  Are you having pain? NPRS scale: 0/10 Pain location: hip, back, legs Pain description: popping and dull/achy Aggravating factors: standing, bending forward (especially left) Relieving factors: sitting down, tylenol PRN   PRECAUTIONS: Anterior hip, Posterior hip, Knee, and Fall  WEIGHT BEARING RESTRICTIONS no  FALLS:  Has patient fallen in last 6 months? Yes. Number of falls 1  LIVING ENVIRONMENT: Lives with: lives with their family Lives in: House/apartment Stairs: yes - 2 to get into home with raining Has following equipment at home: Quad cane large base and Wheelchair (manual)  OCCUPATION:  Pt is currently on long term disability. Previously worked at [employer] prior to injury. Would like to get back to work if the company can find a position that is more stationary.  PLOF: Independent  PATIENT GOALS Pt wants to alleviate pain, become more mobile, strengthen back musculature, strengthen LEs   OBJECTIVE:   DIAGNOSTIC FINDINGS:  LUMBAR SPINE - 2-3 VIEW    COMPARISON:  MRI 09/28/2009   FINDINGS: 4-5 mm retrolisthesis L5-S1, slightly progressive since prior examination, possibly degenerative in nature. Otherwise normal lumbar lordosis. No acute fracture of the lumbar spine. Vertebral body height is preserved. Intervertebral disc space narrowing and endplate remodeling at B1-5 and L5-S1 is in keeping with mild degenerative disc disease at these levels. Paraspinal soft tissues are unremarkable. Right sacroiliac arthrodesis has been performed with 2 partially threaded screws.   IMPRESSION: No acute fracture or traumatic listhesis.   Degenerative changes L4-S1 with 5 mm retrolisthesis L5-S1.     Electronically Signed   By: Fidela Salisbury M.D.   On: 09/17/2021 22:13  PATIENT SURVEYS:  FOTO 52 ; 61  SCREENING FOR RED FLAGS: Bowel or bladder incontinence: No Spinal tumors: No Cauda equina syndrome: No Compression fracture: No Abdominal aneurysm: No  COGNITION:  Overall cognitive status: Within functional limits for tasks assessed     SENSATION: Not tested - patient reports altered sensation in L LE/foot  POSTURE: rounded shoulders, forward head, and weight shift left  PALPATION: Moderate L medial lower leg tenderness with palpation  LUMBAR ROM:   Active  AROM  eval  Flexion 50% limited (compensation to R)  Extension 75% limited (pain)  Right lateral flexion   Left lateral flexion   Right rotation 50% limited  Left rotation 25% limited   (Blank rows = not tested)  LOWER EXTREMITY ROM:     PROM/AROM Right eval Left eval  Hip flexion WFL 119/ 90 deg.  Hip extension    Hip abduction [facility]  Hip adduction    Hip internal rotation    Hip external rotation    Knee flexion WFL 106/ 99 deg.  Knee extension    Ankle dorsiflexion WFL Limited by pain  Ankle plantarflexion WFL Limited by pain  Ankle inversion    Ankle eversion     (Blank rows = not tested)  LOWER EXTREMITY MMT:    MMT Right eval Left eval   Hip flexion 4/5 3/5  Hip extension    Hip abduction 4/5 3/5  Hip adduction 4/5 4/5  Hip internal rotation 5/5 unable  Hip external rotation 5/5 3/5  Knee flexion 5/5 3/5 (pain)  Knee extension 5/5 4/5  Ankle dorsiflexion 4+/5 3/5  Ankle plantarflexion 5/5 4/5  Ankle inversion    Ankle eversion     (Blank rows = not tested)  FUNCTIONAL TESTS:  5 times sit to stand: 25.4 seconds  GAIT: Distance walked: in clinic Assistive device utilized: Quad cane small base Level of assistance: Modified independence Comments: R sided Trendelenburg.  L antalgic gait pattern with decrease stance on L with short R LE step length/ shuffling.  No arm swing. Walks with wide base of support, with mild supination of B feet. Pt had limited endurance, and significantly limited L hip flexion and DF to clear her foot during swing phase.     01/25/22: 5xSTS: 15.94 sec./ 14.94 sec.  (Marked improvement).     Treatment:  02/03/22:  There.ex.:   Nustep L4 10 min. B LE only (increase resistance today).    Walking in //-bars with hip flexion/ 6" hurdle clearance 4 laps.  Cuing to prevent hip circumduction and maintain L hip in midline.  Marked improvement noted.     Walking in //-bars: alt. UE/LE touches with mirror feedback 2 laps.       6" step ups/ downs with mirror feedback 10x.      Seated marching/ LAQ (no wt.) 20x. Each.      Lateral walking in //-bars with no UE assist 5 laps.     STS from gray chair 10x with no UE assist.       Ascending with recip./ descending with step to pattern at stairs.  4 times.   Walking in clinic without SPC and consistent 2-point gait pattern with increase L hip/knee flexion and step pattern.  2 laps in hallway with SBA/ mod. I for cuing and safety.      PATIENT EDUCATION:  Education details: Access Code: [email or web address] Person educated: Patient Education method: Explanation, Demonstration, and Handouts Education comprehension: verbalized understanding and returned  demonstration   HOME EXERCISE PROGRAM: Access Code: [email or web address] URL: [email or web address] Date: 11/25/2021 Prepared by: Dorcas Carrow  Exercises - Straight Leg Raise  - 1 x daily - 7 x weekly - 2 sets - 10 reps - Supine Heel Slide  - 1 x daily - 7 x weekly - 2 sets - 10 reps - Supine Hip Adduction Isometric with Ball  - 1 x daily - 7 x weekly - 2 sets - 10 reps - Hooklying Gluteal Sets  - 1 x daily - 7 x weekly - 2 sets - 10 reps - Seated Heel Raise  - 1 x daily - 7 x weekly - 2 sets - 10 reps - Seated Heel Toe Raises  - 1 x daily - 7 x weekly - 2 sets - 10 reps  ASSESSMENT:  CLINICAL IMPRESSION: Pt. Challenged to ambulate increase distances without use of SPC and focus on less antalgic gait pattern.  Pt. Provided with cuing to correct posture/ step length occasionally while walking in hallway.  Pt. Improving walking endurance and more consistent step pattern.  Limited L hip flexion as compared to R hip but doing better with more consistent L heel strike without verbal cuing.  Pt will benefit from further skilled PT in order to strengthen her LE's, and to progress her gait mechanics.   OBJECTIVE IMPAIRMENTS Abnormal gait, decreased activity tolerance, decreased balance, decreased endurance, decreased mobility, difficulty walking, decreased ROM, decreased strength, hypomobility, impaired flexibility, impaired sensation, improper body mechanics, postural dysfunction, obesity, and pain.   ACTIVITY LIMITATIONS carrying, lifting, bending, sitting, standing, squatting,  sleeping, stairs, transfers, bed mobility, dressing, and locomotion level  PARTICIPATION LIMITATIONS: cleaning, laundry, driving, shopping, community activity, occupation, and yard work  PERSONAL FACTORS Age, Education, and Past/current experiences are also affecting patient's functional outcome.   REHAB POTENTIAL: Good  CLINICAL DECISION MAKING: Evolving/moderate complexity  EVALUATION COMPLEXITY:  Moderate   GOALS: Goals reviewed with patient? Yes  SHORT TERM GOALS: Target date: 02/22/22  Pt will ambulate through the clinic with a symmetrical gait pattern, with equal step length and decreased pain for > 100 ft.  Baseline: Asymmetrical gait - increased L step length and decreased R step length Goal status: Partially met   LONG TERM GOALS: Target date: 03/22/22  Pt will increase FOTO score to > 58 Baseline:  Goal status: On-going  2.  Pt will decrease pain level to < 2/10 during all ADLs, in order to efficiently accomplish activities with decreased pain and increased independence Baseline:  Goal status: Not met  3.  Pt will increase Active L knee flexion ROM to > 120 degrees  Baseline: 99, 106 passive.  8/8: 118 deg.   Goal status: On-going    PLAN: PT FREQUENCY: 2x/week  PT DURATION: 4 weeks  PLANNED INTERVENTIONS: Therapeutic exercises, Therapeutic activity, Neuromuscular re-education, Balance training, Gait training, Patient/Family education, Joint mobilization, Stair training, Aquatic Therapy, Cryotherapy, Moist heat, and Manual therapy.  PLAN FOR NEXT SESSION: Update HEP, strengthen L LE and Hip flexion / hip abduction and stretch L iliopsoas. Gait training. Endurance emphasis.   Pura Spice, PT, DPT # [phone] 02/03/2022, 12:39 PM

## 2022-02-07 ENCOUNTER — Telehealth: Payer: Self-pay

## 2022-02-07 NOTE — Telephone Encounter (Signed)
Attempted to call patient to let her know we do not do long term disability. She had dropped off the paperwork. Left detailed message to inform her that we do not do long term disability.

## 2022-02-08 ENCOUNTER — Encounter: Payer: Self-pay | Admitting: Physical Therapy

## 2022-02-08 ENCOUNTER — Ambulatory Visit: Payer: BC Managed Care – PPO | Admitting: Physical Therapy

## 2022-02-08 DIAGNOSIS — R269 Unspecified abnormalities of gait and mobility: Secondary | ICD-10-CM

## 2022-02-08 DIAGNOSIS — R2689 Other abnormalities of gait and mobility: Secondary | ICD-10-CM

## 2022-02-08 DIAGNOSIS — M25652 Stiffness of left hip, not elsewhere classified: Secondary | ICD-10-CM | POA: Diagnosis not present

## 2022-02-08 DIAGNOSIS — M5459 Other low back pain: Secondary | ICD-10-CM

## 2022-02-08 DIAGNOSIS — M6281 Muscle weakness (generalized): Secondary | ICD-10-CM

## 2022-02-08 DIAGNOSIS — M25552 Pain in left hip: Secondary | ICD-10-CM

## 2022-02-08 NOTE — Therapy (Signed)
OUTPATIENT PHYSICAL THERAPY THORACOLUMBAR TREATMENT   Patient Name: Yesenia Stevens MRN: [id number] DOB:28-Aug-1965, 56 y.o., female Today's Date: 02/08/2022   PT End of Session -     Visit Number 16    Number of Visits 20    Date for PT Re-Evaluation 02/22/22    PT Start Time 5102 to 1033  (45 minutes)    Equipment Utilized During Treatment Gait belt with agility ladder tasks   Activity Tolerance Patient limited by pain;Patient limited by fatigue    Behavior During Therapy [facility] for tasks assessed/performed          Past Medical History:  Diagnosis Date   Diabetes mellitus without complication ([place])    Hypertension    Past Surgical History:  Procedure Laterality Date   CESAREAN SECTION     CHOLECYSTECTOMY     COLONOSCOPY WITH PROPOFOL N/A 12/10/2019   Procedure: COLONOSCOPY WITH PROPOFOL;  Surgeon: [name], MD;  Location: [facility] ENDOSCOPY;  Service: Gastroenterology;  Laterality: N/A;   COLONOSCOPY WITH PROPOFOL N/A 12/24/2019   Procedure: COLONOSCOPY WITH PROPOFOL;  Surgeon: [name], MD;  Location: [facility] ENDOSCOPY;  Service: Gastroenterology;  Laterality: N/A;   FRACTURE SURGERY     Patient Active Problem List   Diagnosis Date Noted   Hyperlipidemia associated with type 2 diabetes mellitus ([place]) 09/22/2021   Palpitations 03/19/2020   Diarrhea 03/19/2020   Premature atrial contractions 08/20/2018   Morbid (severe) obesity due to excess calories ([place]) 08/01/2018   Shortness of breath 07/26/2018   Chronic cough 03/22/2018   Diabetes mellitus without complication ([place]) 58/52/7782   Hypertension 03/22/2018    PCP: Jon Billings, NP   REFERRING PROVIDER: Katherine Mantle, MD  REFERRING DIAG:  Diagnosis  S32.15XD (ICD-10-CM) - Type 2 fracture of sacrum, subsequent encounter for fracture with routine healing  S32.422D (ICD-10-CM) - Displaced fracture of posterior wall of left acetabulum, subsequent encounter for fracture with routine healing    Rationale  for Evaluation and Treatment Rehabilitation  THERAPY DIAG:  Stiffness of left hip joint  Other low back pain  Gait difficulty  Pain in left hip  Balance problems  Muscle weakness (generalized)  ONSET DATE: 05/28/21  SUBJECTIVE:                                                                                                                                                                                           SUBJECTIVE STATEMENT: 02/08/22:  Pt. Reports no new complaints.  Pt. States she had an episode of L LE pain while sitting at church.  No current pain reported.  Pt. States she is heading to [place] on Thursday for  a weekend vacation.    PERTINENT HISTORY:  Hx of L hip dislocation, and screws placed in lower back after her injury. Pt left hospital in a walker, and utilized a WC originally during [facility]. Pt has decreased ankle DF in L ankle, and pain in her lateral foot / 5th metatarsal. Pt had L knee scope, and has tenderness/bruising in L LE along tibia.  PAIN:  Are you having pain? NPRS scale: 0/10 Pain location: hip, back, legs Pain description: popping and dull/achy Aggravating factors: standing, bending forward (especially left) Relieving factors: sitting down, tylenol PRN   PRECAUTIONS: Anterior hip, Posterior hip, Knee, and Fall  WEIGHT BEARING RESTRICTIONS no  FALLS:  Has patient fallen in last 6 months? Yes. Number of falls 1  LIVING ENVIRONMENT: Lives with: lives with their family Lives in: House/apartment Stairs: yes - 2 to get into home with raining Has following equipment at home: Quad cane large base and Wheelchair (manual)  OCCUPATION:  Pt is currently on long term disability. Previously worked at [employer] prior to injury. Would like to get back to work if the company can find a position that is more stationary.  PLOF: Independent  PATIENT GOALS Pt wants to alleviate pain, become more mobile, strengthen back musculature, strengthen  LEs   OBJECTIVE:   DIAGNOSTIC FINDINGS:  LUMBAR SPINE - 2-3 VIEW   COMPARISON:  MRI 09/28/2009   FINDINGS: 4-5 mm retrolisthesis L5-S1, slightly progressive since prior examination, possibly degenerative in nature. Otherwise normal lumbar lordosis. No acute fracture of the lumbar spine. Vertebral body height is preserved. Intervertebral disc space narrowing and endplate remodeling at F6-8 and L5-S1 is in keeping with mild degenerative disc disease at these levels. Paraspinal soft tissues are unremarkable. Right sacroiliac arthrodesis has been performed with 2 partially threaded screws.   IMPRESSION: No acute fracture or traumatic listhesis.   Degenerative changes L4-S1 with 5 mm retrolisthesis L5-S1.     Electronically Signed   By: Fidela Salisbury M.D.   On: 09/17/2021 22:13  PATIENT SURVEYS:  FOTO 52 ; 61  SCREENING FOR RED FLAGS: Bowel or bladder incontinence: No Spinal tumors: No Cauda equina syndrome: No Compression fracture: No Abdominal aneurysm: No  COGNITION:  Overall cognitive status: Within functional limits for tasks assessed     SENSATION: Not tested - patient reports altered sensation in L LE/foot  POSTURE: rounded shoulders, forward head, and weight shift left  PALPATION: Moderate L medial lower leg tenderness with palpation  LUMBAR ROM:   Active  AROM  eval  Flexion 50% limited (compensation to R)  Extension 75% limited (pain)  Right lateral flexion   Left lateral flexion   Right rotation 50% limited  Left rotation 25% limited   (Blank rows = not tested)  LOWER EXTREMITY ROM:     PROM/AROM Right eval Left eval  Hip flexion WFL 119/ 90 deg.  Hip extension    Hip abduction [facility]  Hip adduction    Hip internal rotation    Hip external rotation    Knee flexion WFL 106/ 99 deg.  Knee extension    Ankle dorsiflexion WFL Limited by pain  Ankle plantarflexion WFL Limited by pain  Ankle inversion    Ankle eversion     (Blank rows  = not tested)  LOWER EXTREMITY MMT:    MMT Right eval Left eval  Hip flexion 4/5 3/5  Hip extension    Hip abduction 4/5 3/5  Hip adduction 4/5 4/5  Hip internal rotation 5/5  unable  Hip external rotation 5/5 3/5  Knee flexion 5/5 3/5 (pain)  Knee extension 5/5 4/5  Ankle dorsiflexion 4+/5 3/5  Ankle plantarflexion 5/5 4/5  Ankle inversion    Ankle eversion     (Blank rows = not tested)  FUNCTIONAL TESTS:  5 times sit to stand: 25.4 seconds  GAIT: Distance walked: in clinic Assistive device utilized: Quad cane small base Level of assistance: Modified independence Comments: R sided Trendelenburg.  L antalgic gait pattern with decrease stance on L with short R LE step length/ shuffling.  No arm swing. Walks with wide base of support, with mild supination of B feet. Pt had limited endurance, and significantly limited L hip flexion and DF to clear her foot during swing phase.     01/25/22: 5xSTS: 15.94 sec./ 14.94 sec.  (Marked improvement).     Treatment:  02/08/22:  There.ex.:   Nustep L4 10 min. B LE only (increase resistance today).    Walking at agility ladder with alt. [place] taps. (Light use of SPC)- 4 laps.   Walking in //-bars with [place]x/ 6" step ups and downs 4 laps.  Cuing to prevent hip circumduction and maintain L hip in midline.  Marked improvement noted.     Walking in //-bars: alt. UE/LE touches with mirror feedback 4 laps.  (Moderate L hip fatigue/ pain during 3rd lap)..     STS from blue mat table: 5x with 3rd blue ball/ 5x with 4th blue ball.  Good upright posture/ technique.       Seated marching/ LAQ (no wt.) 20x. Each.      Lateral walking in //-bars with no UE assist 4 laps.      Ascending with recip./ descending with step to pattern at stairs.  3 times.     PATIENT EDUCATION:  Education details: Access Code: [email or web address] Person educated: Patient Education method: Explanation, Demonstration, and Handouts Education comprehension: verbalized  understanding and returned demonstration   HOME EXERCISE PROGRAM: Access Code: [email or web address] URL: [email or web address] Date: 11/25/2021 Prepared by: Dorcas Carrow  Exercises - Straight Leg Raise  - 1 x daily - 7 x weekly - 2 sets - 10 reps - Supine Heel Slide  - 1 x daily - 7 x weekly - 2 sets - 10 reps - Supine Hip Adduction Isometric with Ball  - 1 x daily - 7 x weekly - 2 sets - 10 reps - Hooklying Gluteal Sets  - 1 x daily - 7 x weekly - 2 sets - 10 reps - Seated Heel Raise  - 1 x daily - 7 x weekly - 2 sets - 10 reps - Seated Heel Toe Raises  - 1 x daily - 7 x weekly - 2 sets - 10 reps  ASSESSMENT:  CLINICAL IMPRESSION: Pt. Works hard during tx. Session to increase LE muscle strength/ mod. Independence with gait with less SPC use.  Pt. Provided with cuing to correct posture/ step length occasionally while walking in hallway.  Pt. Improving walking endurance and more consistent step pattern.  Limited L hip flexion as compared to R hip but doing better with more consistent L heel strike without verbal cuing.  Pt will benefit from further skilled PT in order to strengthen her LE's, and to progress her gait mechanics.   OBJECTIVE IMPAIRMENTS Abnormal gait, decreased activity tolerance, decreased balance, decreased endurance, decreased mobility, difficulty walking, decreased ROM, decreased strength, hypomobility, impaired flexibility, impaired sensation, improper body mechanics, postural dysfunction, obesity, and pain.   ACTIVITY LIMITATIONS carrying,  lifting, bending, sitting, standing, squatting, sleeping, stairs, transfers, bed mobility, dressing, and locomotion level  PARTICIPATION LIMITATIONS: cleaning, laundry, driving, shopping, community activity, occupation, and yard work  PERSONAL FACTORS Age, Education, and Past/current experiences are also affecting patient's functional outcome.   REHAB POTENTIAL: Good  CLINICAL DECISION MAKING: Evolving/moderate  complexity  EVALUATION COMPLEXITY: Moderate   GOALS: Goals reviewed with patient? Yes  SHORT TERM GOALS: Target date: 02/22/22  Pt will ambulate through the clinic with a symmetrical gait pattern, with equal step length and decreased pain for > 100 ft.  Baseline: Asymmetrical gait - increased L step length and decreased R step length Goal status: Partially met   LONG TERM GOALS: Target date: 03/22/22  Pt will increase FOTO score to > 58 Baseline:  Goal status: On-going  2.  Pt will decrease pain level to < 2/10 during all ADLs, in order to efficiently accomplish activities with decreased pain and increased independence Baseline:  Goal status: Not met  3.  Pt will increase Active L knee flexion ROM to > 120 degrees  Baseline: 99, 106 passive.  8/8: 118 deg.   Goal status: On-going    PLAN: PT FREQUENCY: 2x/week  PT DURATION: 4 weeks  PLANNED INTERVENTIONS: Therapeutic exercises, Therapeutic activity, Neuromuscular re-education, Balance training, Gait training, Patient/Family education, Joint mobilization, Stair training, Aquatic Therapy, Cryotherapy, Moist heat, and Manual therapy.  PLAN FOR NEXT SESSION: Update HEP, strengthen L LE and Hip flexion / hip abduction and stretch L iliopsoas. Gait training. Endurance emphasis.   Pura Spice, PT, DPT # [phone] 02/08/2022, 12:32 PM

## 2022-02-10 ENCOUNTER — Ambulatory Visit: Payer: BC Managed Care – PPO | Admitting: Physical Therapy

## 2022-02-15 ENCOUNTER — Encounter: Payer: Self-pay | Admitting: Physical Therapy

## 2022-02-15 ENCOUNTER — Ambulatory Visit: Payer: BC Managed Care – PPO | Admitting: Physical Therapy

## 2022-02-15 DIAGNOSIS — M25552 Pain in left hip: Secondary | ICD-10-CM

## 2022-02-15 DIAGNOSIS — R2689 Other abnormalities of gait and mobility: Secondary | ICD-10-CM

## 2022-02-15 DIAGNOSIS — M25652 Stiffness of left hip, not elsewhere classified: Secondary | ICD-10-CM

## 2022-02-15 DIAGNOSIS — M6281 Muscle weakness (generalized): Secondary | ICD-10-CM

## 2022-02-15 DIAGNOSIS — R269 Unspecified abnormalities of gait and mobility: Secondary | ICD-10-CM

## 2022-02-15 DIAGNOSIS — M5459 Other low back pain: Secondary | ICD-10-CM

## 2022-02-15 NOTE — Therapy (Signed)
OUTPATIENT PHYSICAL THERAPY THORACOLUMBAR TREATMENT   Patient Name: Yesenia Stevens MRN: [id number] DOB:[birth year], 56 y.o., female Today's Date: 02/15/2022   PT End of Session -     Visit Number 17    Number of Visits 20    Date for PT Re-Evaluation 02/22/22    PT Start Time 0947 to 1032  (45 minutes)    Equipment Utilized During Treatment No gait belt   Activity Tolerance Patient limited by pain;Patient limited by fatigue    Behavior During Therapy [facility] for tasks assessed/performed          Past Medical History:  Diagnosis Date   Diabetes mellitus without complication ([place])    Hypertension    Past Surgical History:  Procedure Laterality Date   CESAREAN SECTION     CHOLECYSTECTOMY     COLONOSCOPY WITH PROPOFOL N/A 12/10/2019   Procedure: COLONOSCOPY WITH PROPOFOL;  Surgeon: [name], MD;  Location: [facility] ENDOSCOPY;  Service: Gastroenterology;  Laterality: N/A;   COLONOSCOPY WITH PROPOFOL N/A 12/24/2019   Procedure: COLONOSCOPY WITH PROPOFOL;  Surgeon: [name], MD;  Location: [facility] ENDOSCOPY;  Service: Gastroenterology;  Laterality: N/A;   FRACTURE SURGERY     Patient Active Problem List   Diagnosis Date Noted   Hyperlipidemia associated with type 2 diabetes mellitus ([place]) 09/22/2021   Palpitations 03/19/2020   Diarrhea 03/19/2020   Premature atrial contractions 08/20/2018   Morbid (severe) obesity due to excess calories ([place]) 08/01/2018   Shortness of breath 07/26/2018   Chronic cough 03/22/2018   Diabetes mellitus without complication ([place]) 73/71/0626   Hypertension 03/22/2018    PCP: Jon Billings, NP   REFERRING PROVIDER: Katherine Mantle, MD  REFERRING DIAG:  Diagnosis  S32.15XD (ICD-10-CM) - Type 2 fracture of sacrum, subsequent encounter for fracture with routine healing  S32.422D (ICD-10-CM) - Displaced fracture of posterior wall of left acetabulum, subsequent encounter for fracture with routine healing    Rationale for Evaluation and  Treatment Rehabilitation  THERAPY DIAG:  Stiffness of left hip joint  Other low back pain  Gait difficulty  Pain in left hip  Balance problems  Muscle weakness (generalized)  ONSET DATE: 05/28/21  SUBJECTIVE:                                                                                                                                                                                           SUBJECTIVE STATEMENT: 02/15/22:  Pt. Reports no new complaints.  Pt. Used w/c service at airport this past weekend for trip to [facility].    PERTINENT HISTORY:  Hx of L hip dislocation, and screws placed in lower back after her  injury. Pt left hospital in a walker, and utilized a WC originally during [facility]. Pt has decreased ankle DF in L ankle, and pain in her lateral foot / 5th metatarsal. Pt had L knee scope, and has tenderness/bruising in L LE along tibia.  PAIN:  Are you having pain? NPRS scale: 0/10 Pain location: hip, back, legs Pain description: popping and dull/achy Aggravating factors: standing, bending forward (especially left) Relieving factors: sitting down, tylenol PRN   PRECAUTIONS: Anterior hip, Posterior hip, Knee, and Fall  WEIGHT BEARING RESTRICTIONS no  FALLS:  Has patient fallen in last 6 months? Yes. Number of falls 1  LIVING ENVIRONMENT: Lives with: lives with their family Lives in: House/apartment Stairs: yes - 2 to get into home with raining Has following equipment at home: Quad cane large base and Wheelchair (manual)  OCCUPATION:  Pt is currently on long term disability. Previously worked at [employer] prior to injury. Would like to get back to work if the company can find a position that is more stationary.  PLOF: Independent  PATIENT GOALS Pt wants to alleviate pain, become more mobile, strengthen back musculature, strengthen LEs   OBJECTIVE:   DIAGNOSTIC FINDINGS:  LUMBAR SPINE - 2-3 VIEW   COMPARISON:  MRI 09/28/2009   FINDINGS: 4-5  mm retrolisthesis L5-S1, slightly progressive since prior examination, possibly degenerative in nature. Otherwise normal lumbar lordosis. No acute fracture of the lumbar spine. Vertebral body height is preserved. Intervertebral disc space narrowing and endplate remodeling at R5-1 and L5-S1 is in keeping with mild degenerative disc disease at these levels. Paraspinal soft tissues are unremarkable. Right sacroiliac arthrodesis has been performed with 2 partially threaded screws.   IMPRESSION: No acute fracture or traumatic listhesis.   Degenerative changes L4-S1 with 5 mm retrolisthesis L5-S1.     Electronically Signed   By: Fidela Salisbury M.D.   On: 09/17/2021 22:13  PATIENT SURVEYS:  FOTO 52 ; 61  SCREENING FOR RED FLAGS: Bowel or bladder incontinence: No Spinal tumors: No Cauda equina syndrome: No Compression fracture: No Abdominal aneurysm: No  COGNITION:  Overall cognitive status: Within functional limits for tasks assessed     SENSATION: Not tested - patient reports altered sensation in L LE/foot  POSTURE: rounded shoulders, forward head, and weight shift left  PALPATION: Moderate L medial lower leg tenderness with palpation  LUMBAR ROM:   Active  AROM  eval  Flexion 50% limited (compensation to R)  Extension 75% limited (pain)  Right lateral flexion   Left lateral flexion   Right rotation 50% limited  Left rotation 25% limited   (Blank rows = not tested)  LOWER EXTREMITY ROM:     PROM/AROM Right eval Left eval  Hip flexion WFL 119/ 90 deg.  Hip extension    Hip abduction [facility]  Hip adduction    Hip internal rotation    Hip external rotation    Knee flexion WFL 106/ 99 deg.  Knee extension    Ankle dorsiflexion WFL Limited by pain  Ankle plantarflexion WFL Limited by pain  Ankle inversion    Ankle eversion     (Blank rows = not tested)  LOWER EXTREMITY MMT:    MMT Right eval Left eval  Hip flexion 4/5 3/5  Hip extension    Hip  abduction 4/5 3/5  Hip adduction 4/5 4/5  Hip internal rotation 5/5 unable  Hip external rotation 5/5 3/5  Knee flexion 5/5 3/5 (pain)  Knee extension 5/5 4/5  Ankle dorsiflexion 4+/5  3/5  Ankle plantarflexion 5/5 4/5  Ankle inversion    Ankle eversion     (Blank rows = not tested)  FUNCTIONAL TESTS:  5 times sit to stand: 25.4 seconds  GAIT: Distance walked: in clinic Assistive device utilized: Quad cane small base Level of assistance: Modified independence Comments: R sided Trendelenburg.  L antalgic gait pattern with decrease stance on L with short R LE step length/ shuffling.  No arm swing. Walks with wide base of support, with mild supination of B feet. Pt had limited endurance, and significantly limited L hip flexion and DF to clear her foot during swing phase.     01/25/22: 5xSTS: 15.94 sec./ 14.94 sec.  (Marked improvement).     Treatment:  02/15/22:  There.ex.:   Nustep L4 10 min. B LE only (discussed weekend trip to [facility]).    Walking in //-bars: alt. UE/LE touches with mirror feedback 4 laps.  (Moderate L hip fatigue/ pain in groin)- pt. States she occasionally feels "a catch" in L hip.     Seated marching/ LAQ/ heel and toe raises (4# ankle wt.) 20x. Each.   Walking in //-bars with 4# ankle wts:  3" step overs/6" step ups and downs 2 laps.  Cuing to prevent hip circumduction and maintain L hip in midline.  Seated rest break required after due to fatigue.  Walking cone taps in //-bars (no ankle wts.)- 2 laps.     Lateral walking (4# ankle wts.) in //-bars with no UE assist 4 laps. Cuing to correct R LE in midline.       Ascending stairs with recip./ descending pattern 3 times.  Good hip flexion/ foot placement.  Moderate LE fatigue.      PATIENT EDUCATION:  Education details: Access Code: [email or web address] Person educated: Patient Education method: Explanation, Demonstration, and Handouts Education comprehension: verbalized understanding and returned  demonstration   HOME EXERCISE PROGRAM: Access Code: [email or web address] URL: [email or web address] Date: 11/25/2021 Prepared by: Dorcas Carrow  Exercises - Straight Leg Raise  - 1 x daily - 7 x weekly - 2 sets - 10 reps - Supine Heel Slide  - 1 x daily - 7 x weekly - 2 sets - 10 reps - Supine Hip Adduction Isometric with Ball  - 1 x daily - 7 x weekly - 2 sets - 10 reps - Hooklying Gluteal Sets  - 1 x daily - 7 x weekly - 2 sets - 10 reps - Seated Heel Raise  - 1 x daily - 7 x weekly - 2 sets - 10 reps - Seated Heel Toe Raises  - 1 x daily - 7 x weekly - 2 sets - 10 reps  ASSESSMENT:  CLINICAL IMPRESSION: Pt. Works hard during tx. Session to increase LE muscle strength/ mod. Independence with gait with less SPC use.  Pt. Provided with cuing to correct posture/ step length occasionally while walking in hallway.  Pt. Improving walking endurance and more consistent step pattern.  Limited L hip flexion as compared to R hip but doing better with more consistent L heel strike without verbal cuing.  Pt will benefit from further skilled PT in order to strengthen her LE's, and to progress her gait mechanics.   OBJECTIVE IMPAIRMENTS Abnormal gait, decreased activity tolerance, decreased balance, decreased endurance, decreased mobility, difficulty walking, decreased ROM, decreased strength, hypomobility, impaired flexibility, impaired sensation, improper body mechanics, postural dysfunction, obesity, and pain.   ACTIVITY LIMITATIONS carrying, lifting, bending, sitting, standing, squatting, sleeping, stairs, transfers, bed mobility, dressing, and  locomotion level  PARTICIPATION LIMITATIONS: cleaning, laundry, driving, shopping, community activity, occupation, and yard work  PERSONAL FACTORS Age, Education, and Past/current experiences are also affecting patient's functional outcome.   REHAB POTENTIAL: Good  CLINICAL DECISION MAKING: Evolving/moderate complexity  EVALUATION COMPLEXITY:  Moderate   GOALS: Goals reviewed with patient? Yes  SHORT TERM GOALS: Target date: 02/22/22  Pt will ambulate through the clinic with a symmetrical gait pattern, with equal step length and decreased pain for > 100 ft.  Baseline: Asymmetrical gait - increased L step length and decreased R step length Goal status: Partially met   LONG TERM GOALS: Target date: 03/22/22  Pt will increase FOTO score to > 58 Baseline:  Goal status: On-going  2.  Pt will decrease pain level to < 2/10 during all ADLs, in order to efficiently accomplish activities with decreased pain and increased independence Baseline:  Goal status: Not met  3.  Pt will increase Active L knee flexion ROM to > 120 degrees  Baseline: 99, 106 passive.  8/8: 118 deg.   Goal status: On-going    PLAN: PT FREQUENCY: 2x/week  PT DURATION: 4 weeks  PLANNED INTERVENTIONS: Therapeutic exercises, Therapeutic activity, Neuromuscular re-education, Balance training, Gait training, Patient/Family education, Joint mobilization, Stair training, Aquatic Therapy, Cryotherapy, Moist heat, and Manual therapy.  PLAN FOR NEXT SESSION: Strengthening L LE and Hip flexion / hip abduction and stretch L iliopsoas. Gait training. Endurance emphasis.   Pura Spice, PT, DPT # [phone] 02/15/2022, 3:37 PM

## 2022-02-17 ENCOUNTER — Encounter: Payer: BC Managed Care – PPO | Admitting: Physical Therapy

## 2022-02-18 ENCOUNTER — Encounter: Payer: Self-pay | Admitting: Physical Therapy

## 2022-02-18 ENCOUNTER — Ambulatory Visit: Payer: BC Managed Care – PPO | Attending: Trauma Surgery | Admitting: Physical Therapy

## 2022-02-18 DIAGNOSIS — R269 Unspecified abnormalities of gait and mobility: Secondary | ICD-10-CM

## 2022-02-18 DIAGNOSIS — M6281 Muscle weakness (generalized): Secondary | ICD-10-CM

## 2022-02-18 DIAGNOSIS — R2689 Other abnormalities of gait and mobility: Secondary | ICD-10-CM

## 2022-02-18 DIAGNOSIS — M5459 Other low back pain: Secondary | ICD-10-CM

## 2022-02-18 DIAGNOSIS — M25652 Stiffness of left hip, not elsewhere classified: Secondary | ICD-10-CM | POA: Diagnosis present

## 2022-02-18 DIAGNOSIS — M25552 Pain in left hip: Secondary | ICD-10-CM

## 2022-02-18 NOTE — Therapy (Signed)
OUTPATIENT PHYSICAL THERAPY THORACOLUMBAR TREATMENT   Patient Name: Yesenia Stevens MRN: [id number] DOB:Nov 11, 1965, 56 y.o., female Today's Date: 02/18/2022   PT End of Session -     Visit Number 18    Number of Visits 20    Date for PT Re-Evaluation 02/22/22    PT Start Time 6568 to 1116   (44 minutes)    Equipment Utilized During Treatment No gait belt   Activity Tolerance Patient limited by pain;Patient limited by fatigue    Behavior During Therapy [facility] for tasks assessed/performed          Past Medical History:  Diagnosis Date   Diabetes mellitus without complication ([place])    Hypertension    Past Surgical History:  Procedure Laterality Date   CESAREAN SECTION     CHOLECYSTECTOMY     COLONOSCOPY WITH PROPOFOL N/A 12/10/2019   Procedure: COLONOSCOPY WITH PROPOFOL;  Surgeon: [name], MD;  Location: [facility] ENDOSCOPY;  Service: Gastroenterology;  Laterality: N/A;   COLONOSCOPY WITH PROPOFOL N/A 12/24/2019   Procedure: COLONOSCOPY WITH PROPOFOL;  Surgeon: [name], MD;  Location: [facility] ENDOSCOPY;  Service: Gastroenterology;  Laterality: N/A;   FRACTURE SURGERY     Patient Active Problem List   Diagnosis Date Noted   Hyperlipidemia associated with type 2 diabetes mellitus ([place]) 09/22/2021   Palpitations 03/19/2020   Diarrhea 03/19/2020   Premature atrial contractions 08/20/2018   Morbid (severe) obesity due to excess calories ([place]) 08/01/2018   Shortness of breath 07/26/2018   Chronic cough 03/22/2018   Diabetes mellitus without complication ([place]) 12/75/1700   Hypertension 03/22/2018    PCP: Jon Billings, NP   REFERRING PROVIDER: Katherine Mantle, MD  REFERRING DIAG:  Diagnosis  S32.15XD (ICD-10-CM) - Type 2 fracture of sacrum, subsequent encounter for fracture with routine healing  S32.422D (ICD-10-CM) - Displaced fracture of posterior wall of left acetabulum, subsequent encounter for fracture with routine healing    Rationale for Evaluation and  Treatment Rehabilitation  THERAPY DIAG:  Stiffness of left hip joint  Other low back pain  Gait difficulty  Pain in left hip  Balance problems  Muscle weakness (generalized)  ONSET DATE: 05/28/21  SUBJECTIVE:                                                                                                                                                                                           SUBJECTIVE STATEMENT: 02/18/22:  Pt. Reports L anterior hip discomfort walking into PT clinic (4/10).  Pt. States L hip pain was 6/10 yesterday, no reason given.    PERTINENT HISTORY:  Hx of L hip dislocation, and screws placed in  lower back after her injury. Pt left hospital in a walker, and utilized a WC originally during [facility]. Pt has decreased ankle DF in L ankle, and pain in her lateral foot / 5th metatarsal. Pt had L knee scope, and has tenderness/bruising in L LE along tibia.  PAIN:  Are you having pain? NPRS scale: 4/10 Pain location: L hip (anterior) Pain description: popping and dull/achy Aggravating factors: standing, bending forward (especially left) Relieving factors: sitting down, tylenol PRN   PRECAUTIONS: Anterior hip, Posterior hip, Knee, and Fall  WEIGHT BEARING RESTRICTIONS no  FALLS:  Has patient fallen in last 6 months? Yes. Number of falls 1  LIVING ENVIRONMENT: Lives with: lives with their family Lives in: House/apartment Stairs: yes - 2 to get into home with raining Has following equipment at home: Quad cane large base and Wheelchair (manual)  OCCUPATION:  Pt is currently on long term disability. Previously worked at [employer] prior to injury. Would like to get back to work if the company can find a position that is more stationary.  PLOF: Independent  PATIENT GOALS Pt wants to alleviate pain, become more mobile, strengthen back musculature, strengthen LEs   OBJECTIVE:   DIAGNOSTIC FINDINGS:  LUMBAR SPINE - 2-3 VIEW   COMPARISON:  MRI 09/28/2009    FINDINGS: 4-5 mm retrolisthesis L5-S1, slightly progressive since prior examination, possibly degenerative in nature. Otherwise normal lumbar lordosis. No acute fracture of the lumbar spine. Vertebral body height is preserved. Intervertebral disc space narrowing and endplate remodeling at B6-3 and L5-S1 is in keeping with mild degenerative disc disease at these levels. Paraspinal soft tissues are unremarkable. Right sacroiliac arthrodesis has been performed with 2 partially threaded screws.   IMPRESSION: No acute fracture or traumatic listhesis.   Degenerative changes L4-S1 with 5 mm retrolisthesis L5-S1.     Electronically Signed   By: Fidela Salisbury M.D.   On: 09/17/2021 22:13  PATIENT SURVEYS:  FOTO 52 ; 61  SCREENING FOR RED FLAGS: Bowel or bladder incontinence: No Spinal tumors: No Cauda equina syndrome: No Compression fracture: No Abdominal aneurysm: No  COGNITION:  Overall cognitive status: Within functional limits for tasks assessed     SENSATION: Not tested - patient reports altered sensation in L LE/foot  POSTURE: rounded shoulders, forward head, and weight shift left  PALPATION: Moderate L medial lower leg tenderness with palpation  LUMBAR ROM:   Active  AROM  eval  Flexion 50% limited (compensation to R)  Extension 75% limited (pain)  Right lateral flexion   Left lateral flexion   Right rotation 50% limited  Left rotation 25% limited   (Blank rows = not tested)  LOWER EXTREMITY ROM:     PROM/AROM Right eval Left eval  Hip flexion WFL 119/ 90 deg.  Hip extension    Hip abduction [facility]  Hip adduction    Hip internal rotation    Hip external rotation    Knee flexion WFL 106/ 99 deg.  Knee extension    Ankle dorsiflexion WFL Limited by pain  Ankle plantarflexion WFL Limited by pain  Ankle inversion    Ankle eversion     (Blank rows = not tested)  LOWER EXTREMITY MMT:    MMT Right eval Left eval  Hip flexion 4/5 3/5  Hip  extension    Hip abduction 4/5 3/5  Hip adduction 4/5 4/5  Hip internal rotation 5/5 unable  Hip external rotation 5/5 3/5  Knee flexion 5/5 3/5 (pain)  Knee extension 5/5 4/5  Ankle dorsiflexion 4+/5 3/5  Ankle plantarflexion 5/5 4/5  Ankle inversion    Ankle eversion     (Blank rows = not tested)  FUNCTIONAL TESTS:  5 times sit to stand: 25.4 seconds  GAIT: Distance walked: in clinic Assistive device utilized: Quad cane small base Level of assistance: Modified independence Comments: R sided Trendelenburg.  L antalgic gait pattern with decrease stance on L with short R LE step length/ shuffling.  No arm swing. Walks with wide base of support, with mild supination of B feet. Pt had limited endurance, and significantly limited L hip flexion and DF to clear her foot during swing phase.     01/25/22: 5xSTS: 15.94 sec./ 14.94 sec.  (Marked improvement).     Treatment:  02/18/22:  There.ex.:   Nustep L5 10 min. B UE/LE (consistent cadence).    Walking in //-bars (4# ankle wts): forward/ lateral marching 3 laps each/ alt. UE/LE touches with mirror feedback 4 laps.     Seated marching/ LAQ/ heel and toe raises (4# ankle wt.) 20x. Each.  Seated L quad/ eccentric muscle control without ankle wt. 10x.     Step ups/ down on 6" step with min. To no UE assist.  Good hip flexion/ foot placement.  Moderate LE fatigue.   Agility ladder drills (with and without use of cane)- L/R 2 laps each.  SBA/CGA for safety.     Walking out to car with use of QC and consistent recip. Gait pattern/ heel strike.  No LOB       PATIENT EDUCATION:  Education details: Access Code: [phone] Person educated: Patient Education method: Explanation, Demonstration, and Handouts Education comprehension: verbalized understanding and returned demonstration   HOME EXERCISE PROGRAM: Access Code: [email or web address] URL: [email or web address] Date: 11/25/2021 Prepared by: Dorcas Carrow  Exercises -  Straight Leg Raise  - 1 x daily - 7 x weekly - 2 sets - 10 reps - Supine Heel Slide  - 1 x daily - 7 x weekly - 2 sets - 10 reps - Supine Hip Adduction Isometric with Ball  - 1 x daily - 7 x weekly - 2 sets - 10 reps - Hooklying Gluteal Sets  - 1 x daily - 7 x weekly - 2 sets - 10 reps - Seated Heel Raise  - 1 x daily - 7 x weekly - 2 sets - 10 reps - Seated Heel Toe Raises  - 1 x daily - 7 x weekly - 2 sets - 10 reps  ASSESSMENT:  CLINICAL IMPRESSION: Pt. Provided with cuing to correct posture/ step length occasionally while walking in clinic/ outside.  Pt. Improving walking endurance and more consistent step pattern.  Limited L hip flexion as compared to R hip but doing better with more consistent L heel strike without verbal cuing.  Moderate L hip fatigue with use of 4# ankle wts. But able to control L quad during seated LAQ.  Pt will benefit from further skilled PT in order to strengthen her LE's, and to progress her gait mechanics.   OBJECTIVE IMPAIRMENTS Abnormal gait, decreased activity tolerance, decreased balance, decreased endurance, decreased mobility, difficulty walking, decreased ROM, decreased strength, hypomobility, impaired flexibility, impaired sensation, improper body mechanics, postural dysfunction, obesity, and pain.   ACTIVITY LIMITATIONS carrying, lifting, bending, sitting, standing, squatting, sleeping, stairs, transfers, bed mobility, dressing, and locomotion level  PARTICIPATION LIMITATIONS: cleaning, laundry, driving, shopping, community activity, occupation, and yard work  PERSONAL FACTORS Age, Education, and Past/current experiences are also affecting patient's functional outcome.  REHAB POTENTIAL: Good  CLINICAL DECISION MAKING: Evolving/moderate complexity  EVALUATION COMPLEXITY: Moderate   GOALS: Goals reviewed with patient? Yes  SHORT TERM GOALS: Target date: 02/22/22  Pt will ambulate through the clinic with a symmetrical gait pattern, with equal step  length and decreased pain for > 100 ft.  Baseline: Asymmetrical gait - increased L step length and decreased R step length Goal status: Partially met   LONG TERM GOALS: Target date: 03/22/22  Pt will increase FOTO score to > 58 Baseline:  Goal status: On-going  2.  Pt will decrease pain level to < 2/10 during all ADLs, in order to efficiently accomplish activities with decreased pain and increased independence Baseline:  Goal status: Not met  3.  Pt will increase Active L knee flexion ROM to > 120 degrees  Baseline: 99, 106 passive.  8/8: 118 deg.   Goal status: On-going    PLAN: PT FREQUENCY: 2x/week  PT DURATION: 4 weeks  PLANNED INTERVENTIONS: Therapeutic exercises, Therapeutic activity, Neuromuscular re-education, Balance training, Gait training, Patient/Family education, Joint mobilization, Stair training, Aquatic Therapy, Cryotherapy, Moist heat, and Manual therapy.  PLAN FOR NEXT SESSION: Strengthening L LE and Hip flexion / hip abduction and stretch L iliopsoas. Gait training. Endurance emphasis.   CHECK GOALS/RECERT  Pura Spice, PT, DPT # [phone] 02/18/2022, 12:57 PM

## 2022-02-24 ENCOUNTER — Encounter: Payer: Self-pay | Admitting: Physical Therapy

## 2022-02-24 ENCOUNTER — Ambulatory Visit: Payer: BC Managed Care – PPO | Admitting: Physical Therapy

## 2022-02-24 DIAGNOSIS — R2689 Other abnormalities of gait and mobility: Secondary | ICD-10-CM

## 2022-02-24 DIAGNOSIS — M6281 Muscle weakness (generalized): Secondary | ICD-10-CM

## 2022-02-24 DIAGNOSIS — R269 Unspecified abnormalities of gait and mobility: Secondary | ICD-10-CM

## 2022-02-24 DIAGNOSIS — M25652 Stiffness of left hip, not elsewhere classified: Secondary | ICD-10-CM

## 2022-02-24 DIAGNOSIS — M5459 Other low back pain: Secondary | ICD-10-CM

## 2022-02-24 DIAGNOSIS — M25552 Pain in left hip: Secondary | ICD-10-CM

## 2022-02-24 NOTE — Therapy (Signed)
OUTPATIENT PHYSICAL THERAPY THORACOLUMBAR TREATMENT/RECERTIFICATION   Patient Name: Yesenia Stevens MRN: [id number] DOB:02-May-1966, 56 y.o., female Today's Date: 02/24/2022   PT End of Session -     Visit Number 19    Number of Visits  35   Date for PT Re-Evaluation 04/21/22    PT Start Time 0901 to 0946   (45 minutes)    Equipment Utilized During Treatment No gait belt   Activity Tolerance Patient limited by pain;Patient limited by fatigue    Behavior During Therapy [facility] for tasks assessed/performed          Past Medical History:  Diagnosis Date   Diabetes mellitus without complication ([place])    Hypertension    Past Surgical History:  Procedure Laterality Date   CESAREAN SECTION     CHOLECYSTECTOMY     COLONOSCOPY WITH PROPOFOL N/A 12/10/2019   Procedure: COLONOSCOPY WITH PROPOFOL;  Surgeon: [name], MD;  Location: [facility] ENDOSCOPY;  Service: Gastroenterology;  Laterality: N/A;   COLONOSCOPY WITH PROPOFOL N/A 12/24/2019   Procedure: COLONOSCOPY WITH PROPOFOL;  Surgeon: [name], MD;  Location: [facility] ENDOSCOPY;  Service: Gastroenterology;  Laterality: N/A;   FRACTURE SURGERY     Patient Active Problem List   Diagnosis Date Noted   Hyperlipidemia associated with type 2 diabetes mellitus ([place]) 09/22/2021   Palpitations 03/19/2020   Diarrhea 03/19/2020   Premature atrial contractions 08/20/2018   Morbid (severe) obesity due to excess calories ([place]) 08/01/2018   Shortness of breath 07/26/2018   Chronic cough 03/22/2018   Diabetes mellitus without complication ([place]) 43/32/9518   Hypertension 03/22/2018    PCP: Jon Billings, NP   REFERRING PROVIDER: Katherine Mantle, MD  REFERRING DIAG:  Diagnosis  S32.15XD (ICD-10-CM) - Type 2 fracture of sacrum, subsequent encounter for fracture with routine healing  S32.422D (ICD-10-CM) - Displaced fracture of posterior wall of left acetabulum, subsequent encounter for fracture with routine healing    Rationale for  Evaluation and Treatment Rehabilitation  THERAPY DIAG:  Stiffness of left hip joint  Other low back pain  Gait difficulty  Pain in left hip  Balance problems  Muscle weakness (generalized)  ONSET DATE: 05/28/21  SUBJECTIVE:                                                                                                                                                                                           SUBJECTIVE STATEMENT: 02/24/22:  Pt. Reports L anterior hip discomfort walking into PT clinic (0-1/10).  Pt. Discussed L hip pain while sitting in recliner with L hip/foot ER.    PERTINENT HISTORY:  Hx of L hip dislocation, and screws  placed in lower back after her injury. Pt left hospital in a walker, and utilized a WC originally during [facility]. Pt has decreased ankle DF in L ankle, and pain in her lateral foot / 5th metatarsal. Pt had L knee scope, and has tenderness/bruising in L LE along tibia.  PAIN:  Are you having pain? NPRS scale: 4/10 Pain location: L hip (anterior) Pain description: popping and dull/achy Aggravating factors: standing, bending forward (especially left) Relieving factors: sitting down, tylenol PRN   PRECAUTIONS: Anterior hip, Posterior hip, Knee, and Fall  WEIGHT BEARING RESTRICTIONS no  FALLS:  Has patient fallen in last 6 months? Yes. Number of falls 1  LIVING ENVIRONMENT: Lives with: lives with their family Lives in: House/apartment Stairs: yes - 2 to get into home with raining Has following equipment at home: Quad cane large base and Wheelchair (manual)  OCCUPATION:  Pt is currently on long term disability. Previously worked at [employer] prior to injury. Would like to get back to work if the company can find a position that is more stationary.  PLOF: Independent  PATIENT GOALS Pt wants to alleviate pain, become more mobile, strengthen back musculature, strengthen LEs   OBJECTIVE:   DIAGNOSTIC FINDINGS:  LUMBAR SPINE - 2-3 VIEW    COMPARISON:  MRI 09/28/2009   FINDINGS: 4-5 mm retrolisthesis L5-S1, slightly progressive since prior examination, possibly degenerative in nature. Otherwise normal lumbar lordosis. No acute fracture of the lumbar spine. Vertebral body height is preserved. Intervertebral disc space narrowing and endplate remodeling at T1-5 and L5-S1 is in keeping with mild degenerative disc disease at these levels. Paraspinal soft tissues are unremarkable. Right sacroiliac arthrodesis has been performed with 2 partially threaded screws.   IMPRESSION: No acute fracture or traumatic listhesis.   Degenerative changes L4-S1 with 5 mm retrolisthesis L5-S1.     Electronically Signed   By: Fidela Salisbury M.D.   On: 09/17/2021 22:13  PATIENT SURVEYS:  FOTO 52 ; 61  SCREENING FOR RED FLAGS: Bowel or bladder incontinence: No Spinal tumors: No Cauda equina syndrome: No Compression fracture: No Abdominal aneurysm: No  COGNITION:  Overall cognitive status: Within functional limits for tasks assessed     SENSATION: Not tested - patient reports altered sensation in L LE/foot  POSTURE: rounded shoulders, forward head, and weight shift left  PALPATION: Moderate L medial lower leg tenderness with palpation  LUMBAR ROM:   Active  AROM  eval  Flexion 50% limited (compensation to R)  Extension 75% limited (pain)  Right lateral flexion   Left lateral flexion   Right rotation 50% limited  Left rotation 25% limited   (Blank rows = not tested)  LOWER EXTREMITY ROM:     PROM/AROM Right eval Left eval  Hip flexion WFL 119/ 90 deg.  Hip extension    Hip abduction [facility]  Hip adduction    Hip internal rotation    Hip external rotation    Knee flexion WFL 106/ 99 deg.  Knee extension    Ankle dorsiflexion WFL Limited by pain  Ankle plantarflexion WFL Limited by pain  Ankle inversion    Ankle eversion     (Blank rows = not tested)  LOWER EXTREMITY MMT:    MMT Right eval Left eval   Hip flexion 4/5 3/5  Hip extension    Hip abduction 4/5 3/5  Hip adduction 4/5 4/5  Hip internal rotation 5/5 unable  Hip external rotation 5/5 3/5  Knee flexion 5/5 3/5 (pain)  Knee extension  5/5 4/5  Ankle dorsiflexion 4+/5 3/5  Ankle plantarflexion 5/5 4/5  Ankle inversion    Ankle eversion     (Blank rows = not tested)  FUNCTIONAL TESTS:  5 times sit to stand: 25.4 seconds  GAIT: Distance walked: in clinic Assistive device utilized: Quad cane small base Level of assistance: Modified independence Comments: R sided Trendelenburg.  L antalgic gait pattern with decrease stance on L with short R LE step length/ shuffling.  No arm swing. Walks with wide base of support, with mild supination of B feet. Pt had limited endurance, and significantly limited L hip flexion and DF to clear her foot during swing phase.     01/25/22: 5xSTS: 15.94 sec./ 14.94 sec.  (Marked improvement).     Treatment:  02/24/22:  There.ex.:   Nustep L4-5 10 min. B UE/LE (consistent cadence).   Walking in //-bars: forward/ lateral marching 5 laps each/ alt. UE/LE touches with mirror feedback 4 laps.  Cuing to decrease R hip ER and try to maintain midline.     Step ups/ down on 6" step with min. To no UE assist.  Good hip flexion/ foot placement.  Moderate LE fatigue.  Added step backs/downs in //-bars with moderate UE assist 10x.  Focus on eccentric quad control.     Agility ladder drills (with and without use of cane)- L/R 2 laps each.  SBA/CGA for safety.  6" hurdles step overs with HHA for safety with L LE leading 1st.   Amb. In gym with mirror assist with QC/ SPC/ no device working on step pattern.  Pt. Will purchase SPC to progress off the QC.     Walking out to car with use of QC and consistent recip. Gait pattern/ heel strike.  No LOB       PATIENT EDUCATION:  Education details: Access Code: [phone] Person educated: Patient Education method: Explanation, Demonstration, and Handouts Education  comprehension: verbalized understanding and returned demonstration   HOME EXERCISE PROGRAM: Access Code: [email or web address] URL: [email or web address] Date: 11/25/2021 Prepared by: Dorcas Carrow  Exercises - Straight Leg Raise  - 1 x daily - 7 x weekly - 2 sets - 10 reps - Supine Heel Slide  - 1 x daily - 7 x weekly - 2 sets - 10 reps - Supine Hip Adduction Isometric with Ball  - 1 x daily - 7 x weekly - 2 sets - 10 reps - Hooklying Gluteal Sets  - 1 x daily - 7 x weekly - 2 sets - 10 reps - Seated Heel Raise  - 1 x daily - 7 x weekly - 2 sets - 10 reps - Seated Heel Toe Raises  - 1 x daily - 7 x weekly - 2 sets - 10 reps  ASSESSMENT:  CLINICAL IMPRESSION: Pt. Provided with cuing to correct posture/ step length occasionally while walking in clinic/ outside.  Pt. Improving walking endurance and more consistent step pattern.  Limited L hip flexion as compared to R hip but doing better with more consistent L heel strike without verbal cuing.  Pt. Is progressing to use of SPC with more consistent gait pattern.  Pt will benefit from further skilled PT in order to strengthen her LE's, and to progress her gait mechanics.   OBJECTIVE IMPAIRMENTS Abnormal gait, decreased activity tolerance, decreased balance, decreased endurance, decreased mobility, difficulty walking, decreased ROM, decreased strength, hypomobility, impaired flexibility, impaired sensation, improper body mechanics, postural dysfunction, obesity, and pain.   ACTIVITY LIMITATIONS carrying, lifting, bending, sitting, standing, squatting,  sleeping, stairs, transfers, bed mobility, dressing, and locomotion level  PARTICIPATION LIMITATIONS: cleaning, laundry, driving, shopping, community activity, occupation, and yard work  PERSONAL FACTORS Age, Education, and Past/current experiences are also affecting patient's functional outcome.   REHAB POTENTIAL: Good  CLINICAL DECISION MAKING: Evolving/moderate complexity  EVALUATION  COMPLEXITY: Moderate   GOALS: Goals reviewed with patient? Yes  SHORT TERM GOALS: Target date: 03/24/22  Pt will ambulate through the clinic with a symmetrical gait pattern, with equal step length and decreased pain for > 100 ft.  Baseline: Asymmetrical gait - increased L step length and decreased R step length Goal status: Partially met   LONG TERM GOALS: Target date: 04/21/22  Pt will increase FOTO score to > 58 Baseline: 9/7: 52 (no significant changes) Goal status: Not met  2.  Pt will decrease pain level to < 2/10 during all ADLs, in order to efficiently accomplish activities with decreased pain and increased independence Baseline:  Goal status: Not met  3.  Pt will increase Active L knee flexion ROM to > 120 degrees  Baseline: 99, 106 passive.  8/8: 118 deg.   Goal status: On-going    PLAN: PT FREQUENCY: 2x/week  PT DURATION: 8 weeks  PLANNED INTERVENTIONS: Therapeutic exercises, Therapeutic activity, Neuromuscular re-education, Balance training, Gait training, Patient/Family education, Joint mobilization, Stair training, Aquatic Therapy, Cryotherapy, Moist heat, and Manual therapy.  PLAN FOR NEXT SESSION: Strengthening L LE and Hip flexion / hip abduction and stretch L iliopsoas. Gait training. Endurance emphasis.     Pura Spice, PT, DPT # [phone] 02/24/2022, 9:03 AM

## 2022-03-01 ENCOUNTER — Ambulatory Visit: Payer: BC Managed Care – PPO | Admitting: Physical Therapy

## 2022-03-01 ENCOUNTER — Encounter: Payer: Self-pay | Admitting: Physical Therapy

## 2022-03-01 DIAGNOSIS — M25652 Stiffness of left hip, not elsewhere classified: Secondary | ICD-10-CM | POA: Diagnosis not present

## 2022-03-01 DIAGNOSIS — R2689 Other abnormalities of gait and mobility: Secondary | ICD-10-CM

## 2022-03-01 DIAGNOSIS — M6281 Muscle weakness (generalized): Secondary | ICD-10-CM

## 2022-03-01 DIAGNOSIS — R269 Unspecified abnormalities of gait and mobility: Secondary | ICD-10-CM

## 2022-03-01 DIAGNOSIS — M25552 Pain in left hip: Secondary | ICD-10-CM

## 2022-03-01 DIAGNOSIS — M5459 Other low back pain: Secondary | ICD-10-CM
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
## 2022-03-01 NOTE — Therapy (Signed)
OUTPATIENT PHYSICAL THERAPY THORACOLUMBAR TREATMENT Physical Therapy Progress Note   Dates of reporting period  01/13/22   to  03/01/22    Patient Name: Yesenia Stevens MRN: [id number] DOB:04/28/1966, 56 y.o., female Today's Date: 03/01/2022   PT End of Session -     Visit Number 20    Number of Visits  35   Date for PT Re-Evaluation 04/21/22    PT Start Time 0859 to 0947   (48 minutes)    Equipment Utilized During Treatment No gait belt   Activity Tolerance Patient limited by pain;Patient limited by fatigue    Behavior During Therapy [facility] for tasks assessed/performed          Past Medical History:  Diagnosis Date   Diabetes mellitus without complication ([place])    Hypertension    Past Surgical History:  Procedure Laterality Date   CESAREAN SECTION     CHOLECYSTECTOMY     COLONOSCOPY WITH PROPOFOL N/A 12/10/2019   Procedure: COLONOSCOPY WITH PROPOFOL;  Surgeon: [name], MD;  Location:  ENDOSCOPY;  Service: Gastroenterology;  Laterality: N/A;   COLONOSCOPY WITH PROPOFOL N/A 12/24/2019   Procedure: COLONOSCOPY WITH PROPOFOL;  Surgeon: [name], MD;  Location: [facility] ENDOSCOPY;  Service: Gastroenterology;  Laterality: N/A;   FRACTURE SURGERY     Patient Active Problem List   Diagnosis Date Noted   Hyperlipidemia associated with type 2 diabetes mellitus ([place]) 09/22/2021   Palpitations 03/19/2020   Diarrhea 03/19/2020   Premature atrial contractions 08/20/2018   Morbid (severe) obesity due to excess calories ([place]) 08/01/2018   Shortness of breath 07/26/2018   Chronic cough 03/22/2018   Diabetes mellitus without complication ([place]) 97/67/3419   Hypertension 03/22/2018    PCP: Jon Billings, NP   REFERRING PROVIDER: Katherine Mantle, MD  REFERRING DIAG:  Diagnosis  S32.15XD (ICD-10-CM) - Type 2 fracture of sacrum, subsequent encounter for fracture with routine healing  S32.422D (ICD-10-CM) - Displaced fracture of posterior wall of left acetabulum,  subsequent encounter for fracture with routine healing    Rationale for Evaluation and Treatment Rehabilitation  THERAPY DIAG:  Stiffness of left hip joint  Other low back pain  Gait difficulty  Pain in left hip  Balance problems  Muscle weakness (generalized)  ONSET DATE: 05/28/21  SUBJECTIVE:                                                                                                                                                                                           SUBJECTIVE STATEMENT: 03/01/22:  Pt. Reports minimal L anterior hip discomfort walking into PT clinic/ Nustep (2/10).  Pt. Purchased a SPC for community  mobility.   Pt. Returned to MD yesterday and states MD is happy with progress (see recent MD note).      PERTINENT HISTORY:  Hx of L hip dislocation, and screws placed in lower back after her injury. Pt left hospital in a walker, and utilized a WC originally during [facility]. Pt has decreased ankle DF in L ankle, and pain in her lateral foot / 5th metatarsal. Pt had L knee scope, and has tenderness/bruising in L LE along tibia.  PAIN:  Are you having pain? NPRS scale: 2/10 Pain location: L hip (anterior) Pain description: popping and dull/achy Aggravating factors: standing, bending forward (especially left) Relieving factors: sitting down, tylenol PRN   PRECAUTIONS: Anterior hip, Posterior hip, Knee, and Fall  WEIGHT BEARING RESTRICTIONS no  FALLS:  Has patient fallen in last 6 months? Yes. Number of falls 1  LIVING ENVIRONMENT: Lives with: lives with their family Lives in: House/apartment Stairs: yes - 2 to get into home with raining Has following equipment at home: Quad cane large base and Wheelchair (manual)  OCCUPATION:  Pt is currently on long term disability. Previously worked at [employer] prior to injury. Would like to get back to work if the company can find a position that is more stationary.  PLOF: Independent  PATIENT GOALS Pt wants  to alleviate pain, become more mobile, strengthen back musculature, strengthen LEs   OBJECTIVE:   DIAGNOSTIC FINDINGS:  LUMBAR SPINE - 2-3 VIEW   COMPARISON:  MRI 09/28/2009   FINDINGS: 4-5 mm retrolisthesis L5-S1, slightly progressive since prior examination, possibly degenerative in nature. Otherwise normal lumbar lordosis. No acute fracture of the lumbar spine. Vertebral body height is preserved. Intervertebral disc space narrowing and endplate remodeling at Y0-9 and L5-S1 is in keeping with mild degenerative disc disease at these levels. Paraspinal soft tissues are unremarkable. Right sacroiliac arthrodesis has been performed with 2 partially threaded screws.   IMPRESSION: No acute fracture or traumatic listhesis.   Degenerative changes L4-S1 with 5 mm retrolisthesis L5-S1.     Electronically Signed   By: Fidela Salisbury M.D.   On: 09/17/2021 22:13  PATIENT SURVEYS:  FOTO 52 ; 61  SCREENING FOR RED FLAGS: Bowel or bladder incontinence: No Spinal tumors: No Cauda equina syndrome: No Compression fracture: No Abdominal aneurysm: No  COGNITION:  Overall cognitive status: Within functional limits for tasks assessed     SENSATION: Not tested - patient reports altered sensation in L LE/foot  POSTURE: rounded shoulders, forward head, and weight shift left  PALPATION: Moderate L medial lower leg tenderness with palpation  LUMBAR ROM:   Active  AROM  eval  Flexion 50% limited (compensation to R)  Extension 75% limited (pain)  Right lateral flexion   Left lateral flexion   Right rotation 50% limited  Left rotation 25% limited   (Blank rows = not tested)  LOWER EXTREMITY ROM:     PROM/AROM Right eval Left eval  Hip flexion WFL 119/ 90 deg.  Hip extension    Hip abduction [facility]  Hip adduction    Hip internal rotation    Hip external rotation    Knee flexion WFL 106/ 99 deg.  Knee extension    Ankle dorsiflexion WFL Limited by pain  Ankle  plantarflexion WFL Limited by pain  Ankle inversion    Ankle eversion     (Blank rows = not tested)  LOWER EXTREMITY MMT:    MMT Right eval Left eval  Hip flexion 4/5 3/5  Hip  extension    Hip abduction 4/5 3/5  Hip adduction 4/5 4/5  Hip internal rotation 5/5 unable  Hip external rotation 5/5 3/5  Knee flexion 5/5 3/5 (pain)  Knee extension 5/5 4/5  Ankle dorsiflexion 4+/5 3/5  Ankle plantarflexion 5/5 4/5  Ankle inversion    Ankle eversion     (Blank rows = not tested)  FUNCTIONAL TESTS:  5 times sit to stand: 25.4 seconds  GAIT: Distance walked: in clinic Assistive device utilized: Quad cane small base Level of assistance: Modified independence Comments: R sided Trendelenburg.  L antalgic gait pattern with decrease stance on L with short R LE step length/ shuffling.  No arm swing. Walks with wide base of support, with mild supination of B feet. Pt had limited endurance, and significantly limited L hip flexion and DF to clear her foot during swing phase.     01/25/22: 5xSTS: 15.94 sec./ 14.94 sec.  (Marked improvement).     Treatment:  03/01/22:  There.ex.:   Nustep L5 10 min. B UE/LE at seat position 10 (consistent cadence).  Slight increase in L knee discomfort at seat #9.     Walking in PT clinic/ hallway with use of SPC and consistent 2-point gait pattern 2 laps.     Walking in //-bars: lateral marching 5 laps each/ alt. UE/LE touches with mirror feedback 4 laps.  Cuing to decrease R hip ER and try to maintain midline.     Walking in //-bars with 6" hurdles step overs/ 6" step ups and overs 4 laps with use of B UE and progressing to R UE only.  No LOB or mistakes.    TRX squats with chair feedback 20x.  Good technique/ L hip/ LE support in midline.   Supine L SLR/ reassessment of L knee AROM:  0-118 deg.  L active SLR: 52 deg.   Supine quad sets 10x with manual feedback.     Walking out to car with use of QC and consistent recip. Gait pattern/ heel strike.   No LOB       PATIENT EDUCATION:  Education details: Access Code: [phone] Person educated: Patient Education method: Explanation, Demonstration, and Handouts Education comprehension: verbalized understanding and returned demonstration   HOME EXERCISE PROGRAM: Access Code: [email or web address] URL: [email or web address] Date: 11/25/2021 Prepared by: Dorcas Carrow  Exercises - Straight Leg Raise  - 1 x daily - 7 x weekly - 2 sets - 10 reps - Supine Heel Slide  - 1 x daily - 7 x weekly - 2 sets - 10 reps - Supine Hip Adduction Isometric with Ball  - 1 x daily - 7 x weekly - 2 sets - 10 reps - Hooklying Gluteal Sets  - 1 x daily - 7 x weekly - 2 sets - 10 reps - Seated Heel Raise  - 1 x daily - 7 x weekly - 2 sets - 10 reps - Seated Heel Toe Raises  - 1 x daily - 7 x weekly - 2 sets - 10 reps  ASSESSMENT:  CLINICAL IMPRESSION: Pt. Provided with cuing to correct posture/ step length occasionally while walking in clinic/ outside.  Pt. Improving walking endurance and more consistent step pattern.  Limited L hip flexion as compared to R hip but doing better with more consistent L heel strike without verbal cuing.  Pt. Is progressing to use of SPC with more consistent gait pattern.  Pt will benefit from further skilled PT in order to strengthen her LE's, and to progress her gait  mechanics.   OBJECTIVE IMPAIRMENTS Abnormal gait, decreased activity tolerance, decreased balance, decreased endurance, decreased mobility, difficulty walking, decreased ROM, decreased strength, hypomobility, impaired flexibility, impaired sensation, improper body mechanics, postural dysfunction, obesity, and pain.   ACTIVITY LIMITATIONS carrying, lifting, bending, sitting, standing, squatting, sleeping, stairs, transfers, bed mobility, dressing, and locomotion level  PARTICIPATION LIMITATIONS: cleaning, laundry, driving, shopping, community activity, occupation, and yard work  PERSONAL FACTORS Age, Education,  and Past/current experiences are also affecting patient's functional outcome.   REHAB POTENTIAL: Good  CLINICAL DECISION MAKING: Evolving/moderate complexity  EVALUATION COMPLEXITY: Moderate   GOALS: Goals reviewed with patient? Yes  SHORT TERM GOALS: Target date: 03/24/22  Pt will ambulate through the clinic with a symmetrical gait pattern, with equal step length and decreased pain for > 100 ft.  Baseline: Asymmetrical gait - increased L step length and decreased R step length Goal status: Partially met   LONG TERM GOALS: Target date: 04/21/22  Pt will increase FOTO score to > 58 Baseline: 9/7: 52 (no significant changes) Goal status: Not met  2.  Pt will decrease pain level to < 2/10 during all ADLs, in order to efficiently accomplish activities with decreased pain and increased independence Baseline:  Goal status: Partially met  3.  Pt will increase Active L knee flexion ROM to > 120 degrees  Baseline: 99, 106 passive.  8/8: 118 deg.   Goal status: Partially met    PLAN: PT FREQUENCY: 2x/week  PT DURATION: 8 weeks  PLANNED INTERVENTIONS: Therapeutic exercises, Therapeutic activity, Neuromuscular re-education, Balance training, Gait training, Patient/Family education, Joint mobilization, Stair training, Aquatic Therapy, Cryotherapy, Moist heat, and Manual therapy.  PLAN FOR NEXT SESSION: Strengthening L LE and Hip flexion / hip abduction and stretch L iliopsoas. Gait training. Endurance emphasis.     Pura Spice, PT, DPT # [phone] 03/01/2022, 11:47 AM

## 2022-03-02 ENCOUNTER — Ambulatory Visit (INDEPENDENT_AMBULATORY_CARE_PROVIDER_SITE_OTHER): Payer: BC Managed Care – PPO | Admitting: Nurse Practitioner

## 2022-03-02 ENCOUNTER — Encounter: Payer: Self-pay | Admitting: Nurse Practitioner

## 2022-03-02 VITALS — BP 122/86 | HR 88 | Temp 98.6°F | Wt 287.0 lb

## 2022-03-02 DIAGNOSIS — E1169 Type 2 diabetes mellitus with other specified complication: Secondary | ICD-10-CM

## 2022-03-02 DIAGNOSIS — I10 Essential (primary) hypertension: Secondary | ICD-10-CM

## 2022-03-02 DIAGNOSIS — E119 Type 2 diabetes mellitus without complications: Secondary | ICD-10-CM

## 2022-03-02 DIAGNOSIS — Z1159 Encounter for screening for other viral diseases: Secondary | ICD-10-CM

## 2022-03-02 DIAGNOSIS — E785 Hyperlipidemia, unspecified: Secondary | ICD-10-CM

## 2022-03-02 DIAGNOSIS — Z1231 Encounter for screening mammogram for malignant neoplasm of breast: Secondary | ICD-10-CM

## 2022-03-02 MED ORDER — VALSARTAN 80 MG PO TABS: 80.0000 mg | ORAL_TABLET | Freq: Every day | ORAL | 1 refills | Status: DC

## 2022-03-02 MED ORDER — EMPAGLIFLOZIN 10 MG PO TABS: 10.0000 mg | ORAL_TABLET | Freq: Every day | ORAL | 1 refills | Status: DC

## 2022-03-02 MED ORDER — AMLODIPINE BESYLATE 10 MG PO TABS: 10.0000 mg | ORAL_TABLET | Freq: Every day | ORAL | 1 refills | Status: DC

## 2022-03-02 MED ORDER — METFORMIN HCL 1000 MG PO TABS: 1000.0000 mg | ORAL_TABLET | Freq: Two times a day (BID) | ORAL | 1 refills | Status: DC

## 2022-03-02 MED ORDER — GLIPIZIDE 10 MG PO TABS
10.0000 mg | ORAL_TABLET | Freq: Two times a day (BID) | ORAL | 1 refills | Status: DC
Start: 2022-03-02 — End: 2022-12-07

## 2022-03-02 MED ORDER — HYDROCHLOROTHIAZIDE 25 MG PO TABS: 25.0000 mg | ORAL_TABLET | Freq: Every day | ORAL | 1 refills | Status: DC

## 2022-03-02 NOTE — Assessment & Plan Note (Signed)
Chronic.  Controlled.  Continue with current medication regimen of Amlodipine, HCTZ and Valsartan daily.  Labs ordered today.  Return to clinic in 6 months for reevaluation.  Call sooner if concerns arise.

## 2022-03-02 NOTE — Assessment & Plan Note (Signed)
Recommend a healthy lifestyle through diet and exercise.  °

## 2022-03-02 NOTE — Assessment & Plan Note (Addendum)
Chronic.  Controlled.  Continue with current medication regimen.  Last A1c was 7.4.  Foot exam complete. Declined flu vaccines. Labs ordered today.  Return to clinic in 6 months for reevaluation.  Call sooner if concerns arise.

## 2022-03-02 NOTE — Assessment & Plan Note (Addendum)
Chronic.  Controlled.  Continue with current medication regimen.  Labs ordered today.  Return to clinic in 6 months for reevaluation.  Call sooner if concerns arise.  Will likely recommend Statin based on results.

## 2022-03-03 LAB — COMPREHENSIVE METABOLIC PANEL
ALT: 8 IU/L (ref 0–32)
AST: 12 IU/L (ref 0–40)
Albumin/Globulin Ratio: 1.2 (ref 1.2–2.2)
Albumin: 3.8 g/dL (ref 3.8–4.9)
Alkaline Phosphatase: 75 IU/L (ref 44–121)
BUN/Creatinine Ratio: 15 (ref 9–23)
BUN: 12 mg/dL (ref 6–24)
Bilirubin Total: 0.4 mg/dL (ref 0.0–1.2)
CO2: 23 mmol/L (ref 20–29)
Calcium: 9.4 mg/dL (ref 8.7–10.2)
Chloride: 103 mmol/L (ref 96–106)
Creatinine, Ser: 0.81 mg/dL (ref 0.57–1.00)
Globulin, Total: 3.3 g/dL (ref 1.5–4.5)
Glucose: 103 mg/dL — ABNORMAL HIGH (ref 70–99)
Potassium: 4.3 mmol/L (ref 3.5–5.2)
Sodium: 142 mmol/L (ref 134–144)
Total Protein: 7.1 g/dL (ref 6.0–8.5)
eGFR: 86 mL/min/{1.73_m2} (ref >59)

## 2022-03-03 LAB — LIPID PANEL
Chol/HDL Ratio: 4.2 ratio (ref 0.0–4.4)
Cholesterol, Total: 196 mg/dL (ref 100–199)
HDL: 47 mg/dL (ref >39)
LDL Chol Calc (NIH): 130 mg/dL — ABNORMAL HIGH (ref 0–99)
Triglycerides: 104 mg/dL (ref 0–149)
VLDL Cholesterol Cal: 19 mg/dL (ref 5–40)

## 2022-03-03 LAB — HEMOGLOBIN A1C
Est. average glucose Bld gHb Est-mCnc: 148 mg/dL
Hgb A1c MFr Bld: 6.8 % — ABNORMAL HIGH (ref 4.8–5.6)

## 2022-03-03 LAB — HEPATITIS C ANTIBODY: Hep C Virus Ab: NONREACTIVE

## 2022-03-03 MED ORDER — ROSUVASTATIN CALCIUM 5 MG PO TABS: 5.0000 mg | ORAL_TABLET | Freq: Every day | ORAL | 1 refills | Status: DC

## 2022-03-03 NOTE — Addendum Note (Signed)
Addended by: Larae Grooms on: 03/03/2022 03:40 PM   Modules accepted: Orders

## 2022-03-03 NOTE — Progress Notes (Signed)
It is always recommended to work on diet and exercise.  However, being a diabetic puts patient at higher risk of having a cardiac event so it is recommended for diabetics to be on Statins as well.  So my recommendation is the Crestor.

## 2022-03-03 NOTE — Progress Notes (Signed)
Patient has been notified

## 2022-03-03 NOTE — Progress Notes (Signed)
Medication sent to the pharmacy.

## 2022-03-03 NOTE — Progress Notes (Signed)
Please let patient know that her cholesterol is elevated.  Her cardiac risk score puts her at high risk of having a stroke or heart attack over the next 10 years.  I recommend that she start a statin called crestor 5mg  daily.  The goal will be to increase this to 20mg  daily if patient tolerates it well.  If she agrees to the medication I can send it to the pharmacy.    A1c improved from 7.4 to 6.8 which is great news! Keep up the good work.  No other concerns at this time.  Continue with current medication regimen.   The 10-year ASCVD risk score (Arnett DK, et al., 2019) is: 10.4%   Values used to calculate the score:     Age: 56 years     Sex: Female     Is Non-Hispanic African American: Yes     Diabetic: Yes     Tobacco smoker: No     Systolic Blood Pressure: 122 mmHg     Is BP treated: Yes     HDL Cholesterol: 47 mg/dL     Total Cholesterol: 196 mg/dL

## 2022-03-04 ENCOUNTER — Ambulatory Visit: Payer: BC Managed Care – PPO | Admitting: Physical Therapy

## 2022-03-04 ENCOUNTER — Encounter: Payer: Self-pay | Admitting: Physical Therapy

## 2022-03-04 DIAGNOSIS — M25652 Stiffness of left hip, not elsewhere classified: Secondary | ICD-10-CM | POA: Diagnosis not present

## 2022-03-04 DIAGNOSIS — M6281 Muscle weakness (generalized): Secondary | ICD-10-CM

## 2022-03-04 DIAGNOSIS — M25552 Pain in left hip: Secondary | ICD-10-CM

## 2022-03-04 DIAGNOSIS — R2689 Other abnormalities of gait and mobility: Secondary | ICD-10-CM

## 2022-03-04 DIAGNOSIS — R269 Unspecified abnormalities of gait and mobility: Secondary | ICD-10-CM

## 2022-03-04 DIAGNOSIS — M5459 Other low back pain: Secondary | ICD-10-CM

## 2022-03-04 NOTE — Therapy (Signed)
OUTPATIENT PHYSICAL THERAPY THORACOLUMBAR TREATMENT   Patient Name: Yesenia Stevens MRN: [id number] DOB:23-Jun-1965, 56 y.o., female Today's Date: 03/04/2022   PT End of Session -     Visit Number 21    Number of Visits  35   Date for PT Re-Evaluation 04/21/22    PT Start Time 0945 to 1035  (50 minutes)    Equipment Utilized During Treatment No gait belt   Activity Tolerance Patient limited by pain;Patient limited by fatigue    Behavior During Therapy [facility] for tasks assessed/performed          Past Medical History:  Diagnosis Date   Diabetes mellitus without complication ([place])    Hypertension    Past Surgical History:  Procedure Laterality Date   CESAREAN SECTION     CHOLECYSTECTOMY     COLONOSCOPY WITH PROPOFOL N/A 12/10/2019   Procedure: COLONOSCOPY WITH PROPOFOL;  Surgeon: [name], MD;  Location: [facility] ENDOSCOPY;  Service: Gastroenterology;  Laterality: N/A;   COLONOSCOPY WITH PROPOFOL N/A 12/24/2019   Procedure: COLONOSCOPY WITH PROPOFOL;  Surgeon: [name], MD;  Location: [facility] ENDOSCOPY;  Service: Gastroenterology;  Laterality: N/A;   FRACTURE SURGERY     Patient Active Problem List   Diagnosis Date Noted   Hyperlipidemia associated with type 2 diabetes mellitus ([place]) 09/22/2021   Palpitations 03/19/2020   Diarrhea 03/19/2020   Premature atrial contractions 08/20/2018   Morbid (severe) obesity due to excess calories ([place]) 08/01/2018   Shortness of breath 07/26/2018   Chronic cough 37/85/8850   Uncomplicated type 2 diabetes mellitus ([place]) 03/22/2018   Hypertension 03/22/2018    PCP: Jon Billings, NP   REFERRING PROVIDER: Katherine Mantle, MD  REFERRING DIAG:  Diagnosis  S32.15XD (ICD-10-CM) - Type 2 fracture of sacrum, subsequent encounter for fracture with routine healing  S32.422D (ICD-10-CM) - Displaced fracture of posterior wall of left acetabulum, subsequent encounter for fracture with routine healing    Rationale for Evaluation and  Treatment Rehabilitation  THERAPY DIAG:  Stiffness of left hip joint  Other low back pain  Gait difficulty  Pain in left hip  Balance problems  Muscle weakness (generalized)  ONSET DATE: 05/28/21  SUBJECTIVE:                                                                                                                                                                                           SUBJECTIVE STATEMENT: 03/04/22:  Pt. Reports minimal L groin discomfort walking into PT clinic.  Pt. brought in new [facility] with no issues (PT properly sized SPC for pt).    PERTINENT HISTORY:  Hx of L hip dislocation, and screws  placed in lower back after her injury. Pt left hospital in a walker, and utilized a WC originally during [facility]. Pt has decreased ankle DF in L ankle, and pain in her lateral foot / 5th metatarsal. Pt had L knee scope, and has tenderness/bruising in L LE along tibia.  PAIN:  Are you having pain? NPRS scale: 2/10 Pain location: L hip (anterior) Pain description: popping and dull/achy Aggravating factors: standing, bending forward (especially left) Relieving factors: sitting down, tylenol PRN   PRECAUTIONS: Anterior hip, Posterior hip, Knee, and Fall  WEIGHT BEARING RESTRICTIONS no  FALLS:  Has patient fallen in last 6 months? Yes. Number of falls 1  LIVING ENVIRONMENT: Lives with: lives with their family Lives in: House/apartment Stairs: yes - 2 to get into home with raining Has following equipment at home: Quad cane large base and Wheelchair (manual)  OCCUPATION:  Pt is currently on long term disability. Previously worked at [employer] prior to injury. Would like to get back to work if the company can find a position that is more stationary.  PLOF: Independent  PATIENT GOALS Pt wants to alleviate pain, become more mobile, strengthen back musculature, strengthen LEs   OBJECTIVE:   DIAGNOSTIC FINDINGS:  LUMBAR SPINE - 2-3 VIEW   COMPARISON:  MRI  09/28/2009   FINDINGS: 4-5 mm retrolisthesis L5-S1, slightly progressive since prior examination, possibly degenerative in nature. Otherwise normal lumbar lordosis. No acute fracture of the lumbar spine. Vertebral body height is preserved. Intervertebral disc space narrowing and endplate remodeling at D3-5 and L5-S1 is in keeping with mild degenerative disc disease at these levels. Paraspinal soft tissues are unremarkable. Right sacroiliac arthrodesis has been performed with 2 partially threaded screws.   IMPRESSION: No acute fracture or traumatic listhesis.   Degenerative changes L4-S1 with 5 mm retrolisthesis L5-S1.     Electronically Signed   By: Fidela Salisbury M.D.   On: 09/17/2021 22:13  PATIENT SURVEYS:  FOTO 52 ; 61  SCREENING FOR RED FLAGS: Bowel or bladder incontinence: No Spinal tumors: No Cauda equina syndrome: No Compression fracture: No Abdominal aneurysm: No  COGNITION:  Overall cognitive status: Within functional limits for tasks assessed     SENSATION: Not tested - patient reports altered sensation in L LE/foot  POSTURE: rounded shoulders, forward head, and weight shift left  PALPATION: Moderate L medial lower leg tenderness with palpation  LUMBAR ROM:   Active  AROM  eval  Flexion 50% limited (compensation to R)  Extension 75% limited (pain)  Right lateral flexion   Left lateral flexion   Right rotation 50% limited  Left rotation 25% limited   (Blank rows = not tested)  LOWER EXTREMITY ROM:     PROM/AROM Right eval Left eval  Hip flexion WFL 119/ 90 deg.  Hip extension    Hip abduction [facility]  Hip adduction    Hip internal rotation    Hip external rotation    Knee flexion WFL 106/ 99 deg.  Knee extension    Ankle dorsiflexion WFL Limited by pain  Ankle plantarflexion WFL Limited by pain  Ankle inversion    Ankle eversion     (Blank rows = not tested)  LOWER EXTREMITY MMT:    MMT Right eval Left eval  Hip flexion 4/5 3/5   Hip extension    Hip abduction 4/5 3/5  Hip adduction 4/5 4/5  Hip internal rotation 5/5 unable  Hip external rotation 5/5 3/5  Knee flexion 5/5 3/5 (pain)  Knee extension  5/5 4/5  Ankle dorsiflexion 4+/5 3/5  Ankle plantarflexion 5/5 4/5  Ankle inversion    Ankle eversion     (Blank rows = not tested)  FUNCTIONAL TESTS:  5 times sit to stand: 25.4 seconds  GAIT: Distance walked: in clinic Assistive device utilized: Quad cane small base Level of assistance: Modified independence Comments: R sided Trendelenburg.  L antalgic gait pattern with decrease stance on L with short R LE step length/ shuffling.  No arm swing. Walks with wide base of support, with mild supination of B feet. Pt had limited endurance, and significantly limited L hip flexion and DF to clear her foot during swing phase.     01/25/22: 5xSTS: 15.94 sec./ 14.94 sec.  (Marked improvement).     Treatment:  03/04/22:    There.ex.:   4# LE ankle wts.: seated LAQ/ marching/ walking in //-bars (forward/ lateral)- 4 laps.     Nustep L5 10 min. B UE/LE at seat position 10 (consistent cadence)-  0.51 miles   Walking in PT clinic/ hallway with use of SPC and consistent 2-point gait pattern 2 laps.  PT sized SPC to proper fit for pt.     STS 7x from gray chair with no UE assist.  TRX squats with chair feedback 20x.  Good technique/ L hip/ LE support in midline.   Standing cone taps in //-bars with light to no UE assist.  Proprioceptive tasks.    Walking in //-bars with 6" hurdles step overs (4 laps).  Recip. Stair climbing with use of B UE and progressing to R UE only.  No LOB or mistakes.      Walking out to car with use of SPC and consistent recip. Gait pattern/ heel strike.  No LOB.  Walking on grass/ ramp.      PATIENT EDUCATION:  Education details: Access Code: [email or web address] Person educated: Patient Education method: Explanation, Demonstration, and Handouts Education comprehension: verbalized understanding and  returned demonstration   HOME EXERCISE PROGRAM: Access Code: [email or web address] URL: [email or web address] Date: 11/25/2021 Prepared by: Dorcas Carrow  Exercises - Straight Leg Raise  - 1 x daily - 7 x weekly - 2 sets - 10 reps - Supine Heel Slide  - 1 x daily - 7 x weekly - 2 sets - 10 reps - Supine Hip Adduction Isometric with Ball  - 1 x daily - 7 x weekly - 2 sets - 10 reps - Hooklying Gluteal Sets  - 1 x daily - 7 x weekly - 2 sets - 10 reps - Seated Heel Raise  - 1 x daily - 7 x weekly - 2 sets - 10 reps - Seated Heel Toe Raises  - 1 x daily - 7 x weekly - 2 sets - 10 reps  ASSESSMENT:  CLINICAL IMPRESSION: Pt. Improving walking endurance with use of SPC and consistent reciprocal gait pattern.  Limited L hip flexion as compared to R hip but doing better with more consistent L heel strike without verbal cuing.  Pt. Challenged with cone taps/ recip. Stairs without UE assist.  Pt. Benefits from //-bars or handrails with a least 1 UE assist.  Pt will benefit from further skilled PT in order to strengthen her LE's, and to progress her gait mechanics.   OBJECTIVE IMPAIRMENTS Abnormal gait, decreased activity tolerance, decreased balance, decreased endurance, decreased mobility, difficulty walking, decreased ROM, decreased strength, hypomobility, impaired flexibility, impaired sensation, improper body mechanics, postural dysfunction, obesity, and pain.   ACTIVITY LIMITATIONS carrying, lifting, bending, sitting, standing, squatting,  sleeping, stairs, transfers, bed mobility, dressing, and locomotion level  PARTICIPATION LIMITATIONS: cleaning, laundry, driving, shopping, community activity, occupation, and yard work  PERSONAL FACTORS Age, Education, and Past/current experiences are also affecting patient's functional outcome.   REHAB POTENTIAL: Good  CLINICAL DECISION MAKING: Evolving/moderate complexity  EVALUATION COMPLEXITY: Moderate   GOALS: Goals reviewed with patient?  Yes  SHORT TERM GOALS: Target date: 03/24/22  Pt will ambulate through the clinic with a symmetrical gait pattern, with equal step length and decreased pain for > 100 ft.  Baseline: Asymmetrical gait - increased L step length and decreased R step length Goal status: Partially met   LONG TERM GOALS: Target date: 04/21/22  Pt will increase FOTO score to > 58 Baseline: 9/7: 52 (no significant changes) Goal status: Not met  2.  Pt will decrease pain level to < 2/10 during all ADLs, in order to efficiently accomplish activities with decreased pain and increased independence Baseline:  Goal status: Partially met  3.  Pt will increase Active L knee flexion ROM to > 120 degrees  Baseline: 99, 106 passive.  8/8: 118 deg.   Goal status: Partially met    PLAN: PT FREQUENCY: 2x/week  PT DURATION: 8 weeks  PLANNED INTERVENTIONS: Therapeutic exercises, Therapeutic activity, Neuromuscular re-education, Balance training, Gait training, Patient/Family education, Joint mobilization, Stair training, Aquatic Therapy, Cryotherapy, Moist heat, and Manual therapy.  PLAN FOR NEXT SESSION: Strengthening L LE and Hip flexion / hip abduction and stretch L iliopsoas. Gait training. Endurance emphasis.     Pura Spice, PT, DPT # [phone] 03/04/2022, 10:36 AM

## 2022-03-07 ENCOUNTER — Telehealth: Payer: Self-pay

## 2022-03-07 NOTE — Telephone Encounter (Signed)
-----   Message from Jon Billings, NP sent at 03/02/2022 11:03 AM EDT ----- Can we schedule her mammogram.

## 2022-03-07 NOTE — Telephone Encounter (Signed)
Called patient to inform her of her Mammogram that has been scheduled at [place], [place] on Wednesday Oct. 11 at 9:20 AM. [facility]

## 2022-03-08 ENCOUNTER — Ambulatory Visit: Payer: BC Managed Care – PPO | Admitting: Physical Therapy

## 2022-03-08 DIAGNOSIS — R2689 Other abnormalities of gait and mobility: Secondary | ICD-10-CM

## 2022-03-08 DIAGNOSIS — M25652 Stiffness of left hip, not elsewhere classified: Secondary | ICD-10-CM | POA: Diagnosis not present

## 2022-03-08 DIAGNOSIS — M5459 Other low back pain: Secondary | ICD-10-CM

## 2022-03-08 DIAGNOSIS — M25552 Pain in left hip: Secondary | ICD-10-CM

## 2022-03-08 DIAGNOSIS — R269 Unspecified abnormalities of gait and mobility: Secondary | ICD-10-CM

## 2022-03-08 DIAGNOSIS — M6281 Muscle weakness (generalized): Secondary | ICD-10-CM

## 2022-03-08 NOTE — Therapy (Signed)
OUTPATIENT PHYSICAL THERAPY THORACOLUMBAR TREATMENT   Patient Name: Yesenia Stevens MRN: [id number] DOB:10/14/1965, 56 y.o., female Today's Date: 03/08/2022   PT End of Session -     Visit Number 22    Number of Visits  35   Date for PT Re-Evaluation 04/21/22    PT Start Time 0916 to 1015  (59 minutes)    Equipment Utilized During Treatment No gait belt   Activity Tolerance Patient limited by pain;Patient limited by fatigue    Behavior During Therapy WFL for tasks assessed/performed          Past Medical History:  Diagnosis Date   Diabetes mellitus without complication ([facility])    Hypertension    Past Surgical History:  Procedure Laterality Date   CESAREAN SECTION     CHOLECYSTECTOMY     COLONOSCOPY WITH PROPOFOL N/A 12/10/2019   Procedure: COLONOSCOPY WITH PROPOFOL;  Surgeon: Anna, Kiran, MD;  Location: [facility] ENDOSCOPY;  Service: Gastroenterology;  Laterality: N/A;   COLONOSCOPY WITH PROPOFOL N/A 12/24/2019   Procedure: COLONOSCOPY WITH PROPOFOL;  Surgeon: Anna, Kiran, MD;  Location: [facility] ENDOSCOPY;  Service: Gastroenterology;  Laterality: N/A;   FRACTURE SURGERY     Patient Active Problem List   Diagnosis Date Noted   Hyperlipidemia associated with type 2 diabetes mellitus ([facility]) 09/22/2021   Palpitations 03/19/2020   Diarrhea 03/19/2020   Premature atrial contractions 08/20/2018   Morbid (severe) obesity due to excess calories ([facility]) 08/01/2018   Shortness of breath 07/26/2018   Chronic cough 03/22/2018   Uncomplicated type 2 diabetes mellitus ([facility]) 03/22/2018   Hypertension 03/22/2018    PCP: Holdsworth, Karen, NP   REFERRING PROVIDER: Siegel, Judith A, MD  REFERRING DIAG:  Diagnosis  S32.15XD (ICD-10-CM) - Type 2 fracture of sacrum, subsequent encounter for fracture with routine healing  S32.422D (ICD-10-CM) - Displaced fracture of posterior wall of left acetabulum, subsequent encounter for fracture with routine healing    Rationale for Evaluation and  Treatment Rehabilitation  THERAPY DIAG:  Stiffness of left hip joint  Other low back pain  Gait difficulty  Pain in left hip  Balance problems  Muscle weakness (generalized)  ONSET DATE: 05/28/21  SUBJECTIVE:                                                                                                                                                                                           SUBJECTIVE STATEMENT: 03/08/22:  Pt. Reports no recent LOB or falls.  L hip stiffness/ discomfort with walking/ prolonged standing tasks.      PERTINENT HISTORY:  Hx of L hip dislocation, and screws placed in lower back after her   injury. Pt left hospital in a walker, and utilized a WC originally during [facility]. Pt has decreased ankle DF in L ankle, and pain in her lateral foot / 5th metatarsal. Pt had L knee scope, and has tenderness/bruising in L LE along tibia.  PAIN:  Are you having pain? NPRS scale: 2/10 Pain location: L hip (anterior) Pain description: popping and dull/achy Aggravating factors: standing, bending forward (especially left) Relieving factors: sitting down, tylenol PRN   PRECAUTIONS: Anterior hip, Posterior hip, Knee, and Fall  WEIGHT BEARING RESTRICTIONS no  FALLS:  Has patient fallen in last 6 months? Yes. Number of falls 1  LIVING ENVIRONMENT: Lives with: lives with their family Lives in: House/apartment Stairs: yes - 2 to get into home with raining Has following equipment at home: Quad cane large base and Wheelchair (manual)  OCCUPATION:  Pt is currently on long term disability. Previously worked at [employer] prior to injury. Would like to get back to work if the company can find a position that is more stationary.  PLOF: Independent  PATIENT GOALS Pt wants to alleviate pain, become more mobile, strengthen back musculature, strengthen LEs   OBJECTIVE:   DIAGNOSTIC FINDINGS:  LUMBAR SPINE - 2-3 VIEW   COMPARISON:  MRI 09/28/2009   FINDINGS: 4-5 mm  retrolisthesis L5-S1, slightly progressive since prior examination, possibly degenerative in nature. Otherwise normal lumbar lordosis. No acute fracture of the lumbar spine. Vertebral body height is preserved. Intervertebral disc space narrowing and endplate remodeling at L4-5 and L5-S1 is in keeping with mild degenerative disc disease at these levels. Paraspinal soft tissues are unremarkable. Right sacroiliac arthrodesis has been performed with 2 partially threaded screws.   IMPRESSION: No acute fracture or traumatic listhesis.   Degenerative changes L4-S1 with 5 mm retrolisthesis L5-S1.     Electronically Signed   By: Ashesh  Parikh M.D.   On: 09/17/2021 22:13  PATIENT SURVEYS:  FOTO 52 ; 61  SCREENING FOR RED FLAGS: Bowel or bladder incontinence: No Spinal tumors: No Cauda equina syndrome: No Compression fracture: No Abdominal aneurysm: No  COGNITION:  Overall cognitive status: Within functional limits for tasks assessed     SENSATION: Not tested - patient reports altered sensation in L LE/foot  POSTURE: rounded shoulders, forward head, and weight shift left  PALPATION: Moderate L medial lower leg tenderness with palpation  LUMBAR ROM:   Active  AROM  eval  Flexion 50% limited (compensation to R)  Extension 75% limited (pain)  Right lateral flexion   Left lateral flexion   Right rotation 50% limited  Left rotation 25% limited   (Blank rows = not tested)  LOWER EXTREMITY ROM:     PROM/AROM Right eval Left eval  Hip flexion WFL 119/ 90 deg.  Hip extension    Hip abduction WFL WFL  Hip adduction    Hip internal rotation    Hip external rotation    Knee flexion WFL 106/ 99 deg.  Knee extension    Ankle dorsiflexion WFL Limited by pain  Ankle plantarflexion WFL Limited by pain  Ankle inversion    Ankle eversion     (Blank rows = not tested)  LOWER EXTREMITY MMT:    MMT Right eval Left eval  Hip flexion 4/5 3/5  Hip extension    Hip abduction  4/5 3/5  Hip adduction 4/5 4/5  Hip internal rotation 5/5 unable  Hip external rotation 5/5 3/5  Knee flexion 5/5 3/5 (pain)  Knee extension 5/5 4/5  Ankle dorsiflexion 4+/5   3/5  Ankle plantarflexion 5/5 4/5  Ankle inversion    Ankle eversion     (Blank rows = not tested)  FUNCTIONAL TESTS:  5 times sit to stand: 25.4 seconds  GAIT: Distance walked: in clinic Assistive device utilized: Quad cane small base Level of assistance: Modified independence Comments: R sided Trendelenburg.  L antalgic gait pattern with decrease stance on L with short R LE step length/ shuffling.  No arm swing. Walks with wide base of support, with mild supination of B feet. Pt had limited endurance, and significantly limited L hip flexion and DF to clear her foot during swing phase.     01/25/22: 5xSTS: 15.94 sec./ 14.94 sec.  (Marked improvement).     Treatment:  03/08/22:    There.ex.:   Discussed joining [employer] to transition from skilled PT.     Nustep L5 10 min. B UE/LE at seat position 10 (consistent cadence)-  0.51 miles.   4# LE ankle wts.: seated LAQ/ marching/ walking in //-bars (forward/ lateral)- 4 laps.      Tandem gait in //-bars (requires UE assist).  Walking in //-bars with 6" hurdles step overs (4 laps).  Recip. Stair climbing with use of B UE and progressing to R UE only.  No LOB or mistakes.     Walking in PT clinic/ hallway/ outside of clinic (parking lot) with and without use of SPC and consistent 2-point gait pattern 2 laps.  PT sized SPC to proper fit for pt.     TRX squats with chair feedback 20x.  Good technique/ L hip/ LE support in midline.   Standing lunges at 2nd step on L/R 5x each with static holds.     Walking out to car with use of SPC and consistent recip. Gait pattern/ heel strike.  No LOB.      PATIENT EDUCATION:  Education details: Access Code: [email or web address] Person educated: Patient Education method: Explanation, Demonstration, and Handouts Education  comprehension: verbalized understanding and returned demonstration   HOME EXERCISE PROGRAM: Access Code: [email or web address] URL: [email or web address] Date: 11/25/2021 Prepared by: Michael Sherk  Exercises - Straight Leg Raise  - 1 x daily - 7 x weekly - 2 sets - 10 reps - Supine Heel Slide  - 1 x daily - 7 x weekly - 2 sets - 10 reps - Supine Hip Adduction Isometric with Ball  - 1 x daily - 7 x weekly - 2 sets - 10 reps - Hooklying Gluteal Sets  - 1 x daily - 7 x weekly - 2 sets - 10 reps - Seated Heel Raise  - 1 x daily - 7 x weekly - 2 sets - 10 reps - Seated Heel Toe Raises  - 1 x daily - 7 x weekly - 2 sets - 10 reps  ASSESSMENT:  CLINICAL IMPRESSION: Pt. Improving walking endurance with use of SPC and consistent reciprocal gait pattern.  Pt. Able to ambulate short distances without SPC but moderate increase in antalgic gait pattern.  Pt. Benefits from //-bars or handrails with at least 1 UE assist.  Pt will benefit from further skilled PT in order to strengthen her LE's, and to progress her gait mechanics.   OBJECTIVE IMPAIRMENTS Abnormal gait, decreased activity tolerance, decreased balance, decreased endurance, decreased mobility, difficulty walking, decreased ROM, decreased strength, hypomobility, impaired flexibility, impaired sensation, improper body mechanics, postural dysfunction, obesity, and pain.   ACTIVITY LIMITATIONS carrying, lifting, bending, sitting, standing, squatting, sleeping, stairs, transfers, bed mobility, dressing, and locomotion level  PARTICIPATION   LIMITATIONS: cleaning, laundry, driving, shopping, community activity, occupation, and yard work  PERSONAL FACTORS Age, Education, and Past/current experiences are also affecting patient's functional outcome.   REHAB POTENTIAL: Good  CLINICAL DECISION MAKING: Evolving/moderate complexity  EVALUATION COMPLEXITY: Moderate   GOALS: Goals reviewed with patient? Yes  SHORT TERM GOALS: Target date:  03/24/22  Pt will ambulate through the clinic with a symmetrical gait pattern, with equal step length and decreased pain for > 100 ft.  Baseline: Asymmetrical gait - increased L step length and decreased R step length Goal status: Partially met   LONG TERM GOALS: Target date: 04/21/22  Pt will increase FOTO score to > 58 Baseline: 9/7: 52 (no significant changes) Goal status: Not met  2.  Pt will decrease pain level to < 2/10 during all ADLs, in order to efficiently accomplish activities with decreased pain and increased independence Baseline:  Goal status: Partially met  3.  Pt will increase Active L knee flexion ROM to > 120 degrees  Baseline: 99, 106 passive.  8/8: 118 deg.   Goal status: Partially met    PLAN: PT FREQUENCY: 2x/week  PT DURATION: 8 weeks  PLANNED INTERVENTIONS: Therapeutic exercises, Therapeutic activity, Neuromuscular re-education, Balance training, Gait training, Patient/Family education, Joint mobilization, Stair training, Aquatic Therapy, Cryotherapy, Moist heat, and Manual therapy.  PLAN FOR NEXT SESSION: Strengthening L LE and Hip flexion / hip abduction and stretch L iliopsoas. Gait training. Endurance emphasis.     Michael C Sherk, PT, DPT # [id number] 03/08/2022, 5:59 PM [facility] [address] [place], [place], [address] Phone: [phone]   Fax:  [phone]  Patient Details  Name: Yesenia Stevens MRN: [id number] Date of Birth: 10/24/1965 Referring Provider:  Siegel, Judith A, MD  Encounter Date: 03/08/2022   Sherk, Michael C, PT 03/08/2022, 5:59 PM  [facility] [address] [place], [place], [address] Phone: [phone]   Fax:  [phone] 

## 2022-03-10 ENCOUNTER — Encounter: Payer: Self-pay | Admitting: Physical Therapy

## 2022-03-10 ENCOUNTER — Ambulatory Visit: Payer: BC Managed Care – PPO | Admitting: Physical Therapy

## 2022-03-10 DIAGNOSIS — R2689 Other abnormalities of gait and mobility: Secondary | ICD-10-CM

## 2022-03-10 DIAGNOSIS — R269 Unspecified abnormalities of gait and mobility: Secondary | ICD-10-CM

## 2022-03-10 DIAGNOSIS — M25652 Stiffness of left hip, not elsewhere classified: Secondary | ICD-10-CM

## 2022-03-10 DIAGNOSIS — M25552 Pain in left hip: Secondary | ICD-10-CM

## 2022-03-10 DIAGNOSIS — M6281 Muscle weakness (generalized): Secondary | ICD-10-CM

## 2022-03-10 DIAGNOSIS — M5459 Other low back pain: Secondary | ICD-10-CM

## 2022-03-10 NOTE — Therapy (Signed)
OUTPATIENT PHYSICAL THERAPY THORACOLUMBAR TREATMENT   Patient Name: Yesenia Stevens MRN: [id number] DOB:04-18-1966, 56 y.o., female Today's Date: 03/10/2022   PT End of Session -     Visit Number 23    Number of Visits  35   Date for PT Re-Evaluation 04/21/22    PT Start Time 0945 to 1032 (47 minutes)    Equipment Utilized During Treatment No gait belt   Activity Tolerance Patient limited by pain;Patient limited by fatigue    Behavior During Therapy [facility] for tasks assessed/performed          Past Medical History:  Diagnosis Date   Diabetes mellitus without complication ([place])    Hypertension    Past Surgical History:  Procedure Laterality Date   CESAREAN SECTION     CHOLECYSTECTOMY     COLONOSCOPY WITH PROPOFOL N/A 12/10/2019   Procedure: COLONOSCOPY WITH PROPOFOL;  Surgeon: [name], MD;  Location: [facility] ENDOSCOPY;  Service: Gastroenterology;  Laterality: N/A;   COLONOSCOPY WITH PROPOFOL N/A 12/24/2019   Procedure: COLONOSCOPY WITH PROPOFOL;  Surgeon: [name], MD;  Location: [facility] ENDOSCOPY;  Service: Gastroenterology;  Laterality: N/A;   FRACTURE SURGERY     Patient Active Problem List   Diagnosis Date Noted   Hyperlipidemia associated with type 2 diabetes mellitus ([place]) 09/22/2021   Palpitations 03/19/2020   Diarrhea 03/19/2020   Premature atrial contractions 08/20/2018   Morbid (severe) obesity due to excess calories ([place]) 08/01/2018   Shortness of breath 07/26/2018   Chronic cough 22/97/9892   Uncomplicated type 2 diabetes mellitus ([place]) 03/22/2018   Hypertension 03/22/2018    PCP: Jon Billings, NP   REFERRING PROVIDER: Katherine Mantle, MD  REFERRING DIAG:  Diagnosis  S32.15XD (ICD-10-CM) - Type 2 fracture of sacrum, subsequent encounter for fracture with routine healing  S32.422D (ICD-10-CM) - Displaced fracture of posterior wall of left acetabulum, subsequent encounter for fracture with routine healing    Rationale for Evaluation and  Treatment Rehabilitation  THERAPY DIAG:  Stiffness of left hip joint  Other low back pain  Gait difficulty  Pain in left hip  Balance problems  Muscle weakness (generalized)  ONSET DATE: 05/28/21  SUBJECTIVE:                                                                                                                                                                                           SUBJECTIVE STATEMENT: 03/10/22:  Pt. Reports slight increase in L hip stiffness/ discomfort this morning/ last night.  Pt. Reports pain going from L buttocks to L knee ("different kind of pain")- no subjective pain score given.  PERTINENT HISTORY:  Hx of  L hip dislocation, and screws placed in lower back after her injury. Pt left hospital in a walker, and utilized a WC originally during [facility]. Pt has decreased ankle DF in L ankle, and pain in her lateral foot / 5th metatarsal. Pt had L knee scope, and has tenderness/bruising in L LE along tibia.  PAIN:  Are you having pain? NPRS scale: 2/10 Pain location: L hip (anterior) Pain description: popping and dull/achy Aggravating factors: standing, bending forward (especially left) Relieving factors: sitting down, tylenol PRN   PRECAUTIONS: Anterior hip, Posterior hip, Knee, and Fall  WEIGHT BEARING RESTRICTIONS no  FALLS:  Has patient fallen in last 6 months? Yes. Number of falls 1  LIVING ENVIRONMENT: Lives with: lives with their family Lives in: House/apartment Stairs: yes - 2 to get into home with raining Has following equipment at home: Quad cane large base and Wheelchair (manual)  OCCUPATION:  Pt is currently on long term disability. Previously worked at [employer] prior to injury. Would like to get back to work if the company can find a position that is more stationary.  PLOF: Independent  PATIENT GOALS Pt wants to alleviate pain, become more mobile, strengthen back musculature, strengthen LEs   OBJECTIVE:   DIAGNOSTIC  FINDINGS:  LUMBAR SPINE - 2-3 VIEW   COMPARISON:  MRI 09/28/2009   FINDINGS: 4-5 mm retrolisthesis L5-S1, slightly progressive since prior examination, possibly degenerative in nature. Otherwise normal lumbar lordosis. No acute fracture of the lumbar spine. Vertebral body height is preserved. Intervertebral disc space narrowing and endplate remodeling at G2-5 and L5-S1 is in keeping with mild degenerative disc disease at these levels. Paraspinal soft tissues are unremarkable. Right sacroiliac arthrodesis has been performed with 2 partially threaded screws.   IMPRESSION: No acute fracture or traumatic listhesis.   Degenerative changes L4-S1 with 5 mm retrolisthesis L5-S1.     Electronically Signed   By: Fidela Salisbury M.D.   On: 09/17/2021 22:13  PATIENT SURVEYS:  FOTO 52 ; 61  SCREENING FOR RED FLAGS: Bowel or bladder incontinence: No Spinal tumors: No Cauda equina syndrome: No Compression fracture: No Abdominal aneurysm: No  COGNITION:  Overall cognitive status: Within functional limits for tasks assessed     SENSATION: Not tested - patient reports altered sensation in L LE/foot  POSTURE: rounded shoulders, forward head, and weight shift left  PALPATION: Moderate L medial lower leg tenderness with palpation  LUMBAR ROM:   Active  AROM  eval  Flexion 50% limited (compensation to R)  Extension 75% limited (pain)  Right lateral flexion   Left lateral flexion   Right rotation 50% limited  Left rotation 25% limited   (Blank rows = not tested)  LOWER EXTREMITY ROM:     PROM/AROM Right eval Left eval  Hip flexion WFL 119/ 90 deg.  Hip extension    Hip abduction [facility]  Hip adduction    Hip internal rotation    Hip external rotation    Knee flexion WFL 106/ 99 deg.  Knee extension    Ankle dorsiflexion WFL Limited by pain  Ankle plantarflexion WFL Limited by pain  Ankle inversion    Ankle eversion     (Blank rows = not tested)  LOWER EXTREMITY  MMT:    MMT Right eval Left eval  Hip flexion 4/5 3/5  Hip extension    Hip abduction 4/5 3/5  Hip adduction 4/5 4/5  Hip internal rotation 5/5 unable  Hip external rotation 5/5 3/5  Knee flexion 5/5  3/5 (pain)  Knee extension 5/5 4/5  Ankle dorsiflexion 4+/5 3/5  Ankle plantarflexion 5/5 4/5  Ankle inversion    Ankle eversion     (Blank rows = not tested)  FUNCTIONAL TESTS:  5 times sit to stand: 25.4 seconds .  9/21:  14.06 seconds (marked improvement).    GAIT: Distance walked: in clinic Assistive device utilized: Quad cane small base Level of assistance: Modified independence Comments: R sided Trendelenburg.  L antalgic gait pattern with decrease stance on L with short R LE step length/ shuffling.  No arm swing. Walks with wide base of support, with mild supination of B feet. Pt had limited endurance, and significantly limited L hip flexion and DF to clear her foot during swing phase.     01/25/22: 5xSTS: 15.94 sec./ 14.94 sec.  (Marked improvement).     Treatment:  03/10/22:    There.ex.:   Discussed joining [employer] to transition from skilled PT.      Nustep L5 10 min. B UE/LE at seat position 9-10 (consistent cadence)-  0.50 miles.    Standing 1st/2nd step touches and 6" step ups at stairs with light UE assist.  Cuing to correct upright posture/ head position.  Slight increase in L hip discomfort.     Walking in //-bars with high marching and light UE assist 4 laps.  Added alt. UE and LE touches in //-bars with 1 UE assist required (marked fatigue after 2 laps, but completed 4 laps).     Resisted gait in //-bars: 2BTB 5x all 4-planes of movement.     5xSTS: 14.06 seconds (marked improvement).     Walking in PT clinic/ hallway/ outside of clinic (parking lot) with and without use of SPC and consistent 2-point gait pattern 2 laps.  Ascend/ descend curb with R LE first and use of SPC.  Walking in grass/ uneven terrain working on recip. Gait pattern.       PATIENT EDUCATION:  Education details: Access Code: [phone] Person educated: Patient Education method: Explanation, Demonstration, and Handouts Education comprehension: verbalized understanding and returned demonstration   HOME EXERCISE PROGRAM: Access Code: [email or web address] URL: [email or web address] Date: 11/25/2021 Prepared by: Dorcas Carrow  Exercises - Straight Leg Raise  - 1 x daily - 7 x weekly - 2 sets - 10 reps - Supine Heel Slide  - 1 x daily - 7 x weekly - 2 sets - 10 reps - Supine Hip Adduction Isometric with Ball  - 1 x daily - 7 x weekly - 2 sets - 10 reps - Hooklying Gluteal Sets  - 1 x daily - 7 x weekly - 2 sets - 10 reps - Seated Heel Raise  - 1 x daily - 7 x weekly - 2 sets - 10 reps - Seated Heel Toe Raises  - 1 x daily - 7 x weekly - 2 sets - 10 reps  ASSESSMENT:  CLINICAL IMPRESSION: Pt. Improving walking endurance with use of SPC and consistent reciprocal gait pattern with indoor/ outdoor surfaces.  Pt. Able to ambulate short distances without SPC but moderate increase in antalgic gait pattern.  Persistent L hip discomfort today during standing/ walking tasks.  Marked improvement in 5xSTS from gray chair compared to initial evaluation.   Pt. Benefits from //-bars or handrails with at least 1 UE assist.  Pt will benefit from further skilled PT in order to strengthen her LE's, and to progress her gait mechanics.   OBJECTIVE IMPAIRMENTS Abnormal gait, decreased activity tolerance, decreased balance, decreased  endurance, decreased mobility, difficulty walking, decreased ROM, decreased strength, hypomobility, impaired flexibility, impaired sensation, improper body mechanics, postural dysfunction, obesity, and pain.   ACTIVITY LIMITATIONS carrying, lifting, bending, sitting, standing, squatting, sleeping, stairs, transfers, bed mobility, dressing, and locomotion level  PARTICIPATION LIMITATIONS: cleaning, laundry, driving, shopping, community activity,  occupation, and yard work  PERSONAL FACTORS Age, Education, and Past/current experiences are also affecting patient's functional outcome.   REHAB POTENTIAL: Good  CLINICAL DECISION MAKING: Evolving/moderate complexity  EVALUATION COMPLEXITY: Moderate   GOALS: Goals reviewed with patient? Yes  SHORT TERM GOALS: Target date: 03/24/22  Pt will ambulate through the clinic with a symmetrical gait pattern, with equal step length and decreased pain for > 100 ft.  Baseline: Asymmetrical gait - increased L step length and decreased R step length Goal status: Partially met   LONG TERM GOALS: Target date: 04/21/22  Pt will increase FOTO score to > 58 Baseline: 9/7: 52 (no significant changes) Goal status: Not met  2.  Pt will decrease pain level to < 2/10 during all ADLs, in order to efficiently accomplish activities with decreased pain and increased independence Baseline:  Goal status: Partially met  3.  Pt will increase Active L knee flexion ROM to > 120 degrees  Baseline: 99, 106 passive.  8/8: 118 deg.   Goal status: Partially met    PLAN: PT FREQUENCY: 2x/week  PT DURATION: 8 weeks  PLANNED INTERVENTIONS: Therapeutic exercises, Therapeutic activity, Neuromuscular re-education, Balance training, Gait training, Patient/Family education, Joint mobilization, Stair training, Aquatic Therapy, Cryotherapy, Moist heat, and Manual therapy.  PLAN FOR NEXT SESSION: Strengthening L LE and Hip flexion / hip abduction and stretch L iliopsoas. Gait training. Endurance emphasis.      Pura Spice, PT, DPT # [id number] 03/10/2022, 9:49 AM [facility] [address] [place], [place], [address] Phone: [phone]   Fax:  [phone]

## 2022-03-15 ENCOUNTER — Encounter: Payer: Self-pay | Admitting: Physical Therapy

## 2022-03-15 ENCOUNTER — Ambulatory Visit: Payer: BC Managed Care – PPO | Admitting: Physical Therapy

## 2022-03-15 DIAGNOSIS — M25552 Pain in left hip: Secondary | ICD-10-CM

## 2022-03-15 DIAGNOSIS — M25652 Stiffness of left hip, not elsewhere classified: Secondary | ICD-10-CM | POA: Diagnosis not present

## 2022-03-15 DIAGNOSIS — R269 Unspecified abnormalities of gait and mobility: Secondary | ICD-10-CM

## 2022-03-15 DIAGNOSIS — M5459 Other low back pain: Secondary | ICD-10-CM

## 2022-03-15 DIAGNOSIS — R2689 Other abnormalities of gait and mobility: Secondary | ICD-10-CM

## 2022-03-15 DIAGNOSIS — M6281 Muscle weakness (generalized): Secondary | ICD-10-CM

## 2022-03-15 NOTE — Therapy (Signed)
OUTPATIENT PHYSICAL THERAPY THORACOLUMBAR TREATMENT   Patient Name: Yesenia Stevens MRN: [id number] DOB:[birth year], 56 y.o., female Today's Date: 03/15/2022   PT End of Session -     Visit Number 24    Number of Visits  35   Date for PT Re-Evaluation 04/21/22    PT Start Time 0944 to 2202 (50 minutes)    Equipment Utilized During Treatment No gait belt   Activity Tolerance Patient limited by pain;Patient limited by fatigue    Behavior During Therapy [facility] for tasks assessed/performed          Past Medical History:  Diagnosis Date   Diabetes mellitus without complication ([place])    Hypertension    Past Surgical History:  Procedure Laterality Date   CESAREAN SECTION     CHOLECYSTECTOMY     COLONOSCOPY WITH PROPOFOL N/A 12/10/2019   Procedure: COLONOSCOPY WITH PROPOFOL;  Surgeon: [name], MD;  Location[facility] ENDOSCOPY;  Service: Gastroenterology;  Laterality: N/A;   COLONOSCOPY WITH PROPOFOL N/A 12/24/2019   Procedure: COLONOSCOPY WITH PROPOFOL;  Surgeon: [name], MD;  Location: [facility] ENDOSCOPY;  Service: Gastroenterology;  Laterality: N/A;   FRACTURE SURGERY     Patient Active Problem List   Diagnosis Date Noted   Hyperlipidemia associated with type 2 diabetes mellitus ([place]) 09/22/2021   Palpitations 03/19/2020   Diarrhea 03/19/2020   Premature atrial contractions 08/20/2018   Morbid (severe) obesity due to excess calories ([place]) 08/01/2018   Shortness of breath 07/26/2018   Chronic cough 54/27/0623   Uncomplicated type 2 diabetes mellitus ([place]) 03/22/2018   Hypertension 03/22/2018    PCP: Jon Billings, NP   REFERRING PROVIDER: Katherine Mantle, MD  REFERRING DIAG:  Diagnosis  S32.15XD (ICD-10-CM) - Type 2 fracture of sacrum, subsequent encounter for fracture with routine healing  S32.422D (ICD-10-CM) - Displaced fracture of posterior wall of left acetabulum, subsequent encounter for fracture with routine healing    Rationale for Evaluation and  Treatment Rehabilitation  THERAPY DIAG:  Stiffness of left hip joint  Other low back pain  Gait difficulty  Pain in left hip  Balance problems  Muscle weakness (generalized)  ONSET DATE: 05/28/21  SUBJECTIVE:                                                                                                                                                                                           SUBJECTIVE STATEMENT: 03/15/22:  Pt. Reports slight increase in L hip stiffness/ discomfort this morning/walking into PT clinic.    PERTINENT HISTORY:  Hx of L hip dislocation, and screws placed in lower back after her injury. Pt left hospital in a  walker, and utilized a WC originally during [facility]. Pt has decreased ankle DF in L ankle, and pain in her lateral foot / 5th metatarsal. Pt had L knee scope, and has tenderness/bruising in L LE along tibia.  PAIN:  Are you having pain? NPRS scale: 2/10 Pain location: L hip (anterior) Pain description: popping and dull/achy Aggravating factors: standing, bending forward (especially left) Relieving factors: sitting down, tylenol PRN   PRECAUTIONS: Anterior hip, Posterior hip, Knee, and Fall  WEIGHT BEARING RESTRICTIONS no  FALLS:  Has patient fallen in last 6 months? Yes. Number of falls 1  LIVING ENVIRONMENT: Lives with: lives with their family Lives in: House/apartment Stairs: yes - 2 to get into home with raining Has following equipment at home: Quad cane large base and Wheelchair (manual)  OCCUPATION:  Pt is currently on long term disability. Previously worked at [employer] prior to injury. Would like to get back to work if the company can find a position that is more stationary.  PLOF: Independent  PATIENT GOALS Pt wants to alleviate pain, become more mobile, strengthen back musculature, strengthen LEs   OBJECTIVE:   DIAGNOSTIC FINDINGS:  LUMBAR SPINE - 2-3 VIEW   COMPARISON:  MRI 09/28/2009   FINDINGS: 4-5 mm  retrolisthesis L5-S1, slightly progressive since prior examination, possibly degenerative in nature. Otherwise normal lumbar lordosis. No acute fracture of the lumbar spine. Vertebral body height is preserved. Intervertebral disc space narrowing and endplate remodeling at I2-9 and L5-S1 is in keeping with mild degenerative disc disease at these levels. Paraspinal soft tissues are unremarkable. Right sacroiliac arthrodesis has been performed with 2 partially threaded screws.   IMPRESSION: No acute fracture or traumatic listhesis.   Degenerative changes L4-S1 with 5 mm retrolisthesis L5-S1.     Electronically Signed   By: Fidela Salisbury M.D.   On: 09/17/2021 22:13  PATIENT SURVEYS:  FOTO 52 ; 61  SCREENING FOR RED FLAGS: Bowel or bladder incontinence: No Spinal tumors: No Cauda equina syndrome: No Compression fracture: No Abdominal aneurysm: No  COGNITION:  Overall cognitive status: Within functional limits for tasks assessed     SENSATION: Not tested - patient reports altered sensation in L LE/foot  POSTURE: rounded shoulders, forward head, and weight shift left  PALPATION: Moderate L medial lower leg tenderness with palpation  LUMBAR ROM:   Active  AROM  eval  Flexion 50% limited (compensation to R)  Extension 75% limited (pain)  Right lateral flexion   Left lateral flexion   Right rotation 50% limited  Left rotation 25% limited   (Blank rows = not tested)  LOWER EXTREMITY ROM:     PROM/AROM Right eval Left eval  Hip flexion WFL 119/ 90 deg.  Hip extension    Hip abduction [facility]  Hip adduction    Hip internal rotation    Hip external rotation    Knee flexion WFL 106/ 99 deg.  Knee extension    Ankle dorsiflexion WFL Limited by pain  Ankle plantarflexion WFL Limited by pain  Ankle inversion    Ankle eversion     (Blank rows = not tested)  LOWER EXTREMITY MMT:    MMT Right eval Left eval  Hip flexion 4/5 3/5  Hip extension    Hip abduction  4/5 3/5  Hip adduction 4/5 4/5  Hip internal rotation 5/5 unable  Hip external rotation 5/5 3/5  Knee flexion 5/5 3/5 (pain)  Knee extension 5/5 4/5  Ankle dorsiflexion 4+/5 3/5  Ankle plantarflexion 5/5 4/5  Ankle inversion    Ankle eversion     (Blank rows = not tested)  FUNCTIONAL TESTS:  5 times sit to stand: 25.4 seconds .  9/21:  14.06 seconds (marked improvement).    GAIT: Distance walked: in clinic Assistive device utilized: Quad cane small base Level of assistance: Modified independence Comments: R sided Trendelenburg.  L antalgic gait pattern with decrease stance on L with short R LE step length/ shuffling.  No arm swing. Walks with wide base of support, with mild supination of B feet. Pt had limited endurance, and significantly limited L hip flexion and DF to clear her foot during swing phase.     01/25/22: 5xSTS: 15.94 sec./ 14.94 sec.  (Marked improvement).      9/21:  5xSTS: 14.06 seconds (marked improvement).      Treatment:  03/15/22:   Discussed joining [employer] to transition from skilled PT.     There.ex.:       Nustep L5 10 min. B UE/LE at seat position 9-10 (consistent cadence)-  0.50 miles.   Walking in hallway with SPC (2 laps)/ recip. Stair climbing 5x.  Slight L hip discomfort with initial step ups on L.  Heavy UE assist for safety.     Resisted gait in //-bars: 2BTB 5x all 4-planes of movement.  No UE assist.  Cuing to correct upright posture.    Walking in //-bars (3#) with high marching and no UE assist 5 laps.  No ankle wts. With alt. UE/LE touches.        Walking in PT clinic/ hallway/ outside of clinic (parking lot) with and without use of SPC and consistent 2-point gait pattern 2 laps.  Ascend/ descend curb with R LE first and use of SPC.  Walking in grass/ uneven terrain working on recip. Gait pattern.      PATIENT EDUCATION:  Education details: Access Code: [email or web address] Person educated: Patient Education method: Explanation,  Demonstration, and Handouts Education comprehension: verbalized understanding and returned demonstration   HOME EXERCISE PROGRAM: Access Code: [email or web address] URL: [email or web address] Date: 11/25/2021 Prepared by: Dorcas Carrow  Exercises - Straight Leg Raise  - 1 x daily - 7 x weekly - 2 sets - 10 reps - Supine Heel Slide  - 1 x daily - 7 x weekly - 2 sets - 10 reps - Supine Hip Adduction Isometric with Ball  - 1 x daily - 7 x weekly - 2 sets - 10 reps - Hooklying Gluteal Sets  - 1 x daily - 7 x weekly - 2 sets - 10 reps - Seated Heel Raise  - 1 x daily - 7 x weekly - 2 sets - 10 reps - Seated Heel Toe Raises  - 1 x daily - 7 x weekly - 2 sets - 10 reps  ASSESSMENT:  CLINICAL IMPRESSION: Pt. Improving walking endurance with use of SPC and consistent reciprocal gait pattern with indoor/ outdoor surfaces.  Pt. Able to ambulate short distances without SPC but moderate increase in antalgic gait pattern.  Persistent L hip discomfort today during standing/ walking tasks.   Pt. Benefits from //-bars or handrails with at least 1 UE assist.  Pt will benefit from further skilled PT in order to strengthen her LE's, and to progress her gait mechanics.   OBJECTIVE IMPAIRMENTS Abnormal gait, decreased activity tolerance, decreased balance, decreased endurance, decreased mobility, difficulty walking, decreased ROM, decreased strength, hypomobility, impaired flexibility, impaired sensation, improper body mechanics, postural dysfunction, obesity, and pain.   ACTIVITY LIMITATIONS carrying, lifting,  bending, sitting, standing, squatting, sleeping, stairs, transfers, bed mobility, dressing, and locomotion level  PARTICIPATION LIMITATIONS: cleaning, laundry, driving, shopping, community activity, occupation, and yard work  PERSONAL FACTORS Age, Education, and Past/current experiences are also affecting patient's functional outcome.   REHAB POTENTIAL: Good  CLINICAL DECISION MAKING:  Evolving/moderate complexity  EVALUATION COMPLEXITY: Moderate   GOALS: Goals reviewed with patient? Yes  SHORT TERM GOALS: Target date: 03/24/22  Pt will ambulate through the clinic with a symmetrical gait pattern, with equal step length and decreased pain for > 100 ft.  Baseline: Asymmetrical gait - increased L step length and decreased R step length Goal status: Partially met   LONG TERM GOALS: Target date: 04/21/22  Pt will increase FOTO score to > 58 Baseline: 9/7: 52 (no significant changes) Goal status: Not met  2.  Pt will decrease pain level to < 2/10 during all ADLs, in order to efficiently accomplish activities with decreased pain and increased independence Baseline:  Goal status: Partially met  3.  Pt will increase Active L knee flexion ROM to > 120 degrees  Baseline: 99, 106 passive.  8/8: 118 deg.   Goal status: Partially met    PLAN: PT FREQUENCY: 2x/week  PT DURATION: 8 weeks  PLANNED INTERVENTIONS: Therapeutic exercises, Therapeutic activity, Neuromuscular re-education, Balance training, Gait training, Patient/Family education, Joint mobilization, Stair training, Aquatic Therapy, Cryotherapy, Moist heat, and Manual therapy.  PLAN FOR NEXT SESSION: Strengthening L LE and Hip flexion / hip abduction and stretch L iliopsoas. Gait training. Endurance emphasis.      Pura Spice, PT, DPT # [id number] 03/15/2022, 8:00 PM [facility] [address] [place], [place], [address] Phone: [phone]   Fax:  [phone]

## 2022-03-17 ENCOUNTER — Encounter: Payer: Self-pay | Admitting: Physical Therapy

## 2022-03-17 ENCOUNTER — Ambulatory Visit: Payer: BC Managed Care – PPO | Admitting: Physical Therapy

## 2022-03-17 DIAGNOSIS — M25552 Pain in left hip: Secondary | ICD-10-CM

## 2022-03-17 DIAGNOSIS — M25652 Stiffness of left hip, not elsewhere classified: Secondary | ICD-10-CM | POA: Diagnosis not present

## 2022-03-17 DIAGNOSIS — R269 Unspecified abnormalities of gait and mobility: Secondary | ICD-10-CM

## 2022-03-17 DIAGNOSIS — M6281 Muscle weakness (generalized): Secondary | ICD-10-CM

## 2022-03-17 DIAGNOSIS — M5459 Other low back pain: Secondary | ICD-10-CM

## 2022-03-17 DIAGNOSIS — R2689 Other abnormalities of gait and mobility: Secondary | ICD-10-CM

## 2022-03-17 NOTE — Therapy (Signed)
OUTPATIENT PHYSICAL THERAPY THORACOLUMBAR TREATMENT   Patient Name: Yesenia Stevens MRN: [id number] DOB:1965/08/29, 56 y.o., female Today's Date: 03/17/2022   PT End of Session -     Visit Number 25    Number of Visits  35   Date for PT Re-Evaluation 04/21/22    PT Start Time 1856 to 1122 (48 minutes)    Equipment Utilized During Treatment No gait belt   Activity Tolerance Patient limited by pain;Patient limited by fatigue    Behavior During Therapy [facility] for tasks assessed/performed          Past Medical History:  Diagnosis Date   Diabetes mellitus without complication ([place])    Hypertension    Past Surgical History:  Procedure Laterality Date   CESAREAN SECTION     CHOLECYSTECTOMY     COLONOSCOPY WITH PROPOFOL N/A 12/10/2019   Procedure: COLONOSCOPY WITH PROPOFOL;  Surgeon: [name], MD;  [facility];  Service: Gastroenterology;  Laterality: N/A;   COLONOSCOPY WITH PROPOFOL N/A 12/24/2019   Procedure: COLONOSCOPY WITH PROPOFOL;  Surgeon: [name], MD;  Location: [facility] ENDOSCOPY;  Service: Gastroenterology;  Laterality: N/A;   FRACTURE SURGERY     Patient Active Problem List   Diagnosis Date Noted   Hyperlipidemia associated with type 2 diabetes mellitus ([place]) 09/22/2021   Palpitations 03/19/2020   Diarrhea 03/19/2020   Premature atrial contractions 08/20/2018   Morbid (severe) obesity due to excess calories ([place]) 08/01/2018   Shortness of breath 07/26/2018   Chronic cough 31/49/7026   Uncomplicated type 2 diabetes mellitus ([place]) 03/22/2018   Hypertension 03/22/2018    PCP: Jon Billings, NP   REFERRING PROVIDER: Katherine Mantle, MD  REFERRING DIAG:  Diagnosis  S32.15XD (ICD-10-CM) - Type 2 fracture of sacrum, subsequent encounter for fracture with routine healing  S32.422D (ICD-10-CM) - Displaced fracture of posterior wall of left acetabulum, subsequent encounter for fracture with routine healing    Rationale for Evaluation and  Treatment Rehabilitation  THERAPY DIAG:  Stiffness of left hip joint  Other low back pain  Gait difficulty  Pain in left hip  Balance problems  Muscle weakness (generalized)  ONSET DATE: 05/28/21  SUBJECTIVE:                                                                                                                                                                                           SUBJECTIVE STATEMENT: 03/17/22:  Pt. Reports no new complaints.  Pt. Reports no pain while walking into PT clinic.  Pt. Talked with [employer] yesterday and called [employer] about aquatic ex.  Pt. Feels joining the [facility] may benefit her more with  the gym/pool.    PERTINENT HISTORY:  Hx of L hip dislocation, and screws placed in lower back after her injury. Pt left hospital in a walker, and utilized a WC originally during [facility]. Pt has decreased ankle DF in L ankle, and pain in her lateral foot / 5th metatarsal. Pt had L knee scope, and has tenderness/bruising in L LE along tibia.  PAIN:  Are you having pain? NPRS scale: 2/10 Pain location: L hip (anterior) Pain description: popping and dull/achy Aggravating factors: standing, bending forward (especially left) Relieving factors: sitting down, tylenol PRN   PRECAUTIONS: Anterior hip, Posterior hip, Knee, and Fall  WEIGHT BEARING RESTRICTIONS no  FALLS:  Has patient fallen in last 6 months? Yes. Number of falls 1  LIVING ENVIRONMENT: Lives with: lives with their family Lives in: House/apartment Stairs: yes - 2 to get into home with raining Has following equipment at home: Quad cane large base and Wheelchair (manual)  OCCUPATION:  Pt is currently on long term disability. Previously worked at [employer] prior to injury. Would like to get back to work if the company can find a position that is more stationary.  PLOF: Independent  PATIENT GOALS Pt wants to alleviate pain, become more mobile, strengthen back musculature, strengthen  LEs   OBJECTIVE:   DIAGNOSTIC FINDINGS:  LUMBAR SPINE - 2-3 VIEW   COMPARISON:  MRI 09/28/2009   FINDINGS: 4-5 mm retrolisthesis L5-S1, slightly progressive since prior examination, possibly degenerative in nature. Otherwise normal lumbar lordosis. No acute fracture of the lumbar spine. Vertebral body height is preserved. Intervertebral disc space narrowing and endplate remodeling at Z3-0 and L5-S1 is in keeping with mild degenerative disc disease at these levels. Paraspinal soft tissues are unremarkable. Right sacroiliac arthrodesis has been performed with 2 partially threaded screws.   IMPRESSION: No acute fracture or traumatic listhesis.   Degenerative changes L4-S1 with 5 mm retrolisthesis L5-S1.     Electronically Signed   By: Fidela Salisbury M.D.   On: 09/17/2021 22:13  PATIENT SURVEYS:  FOTO 52 ; 61  SCREENING FOR RED FLAGS: Bowel or bladder incontinence: No Spinal tumors: No Cauda equina syndrome: No Compression fracture: No Abdominal aneurysm: No  COGNITION:  Overall cognitive status: Within functional limits for tasks assessed     SENSATION: Not tested - patient reports altered sensation in L LE/foot  POSTURE: rounded shoulders, forward head, and weight shift left  PALPATION: Moderate L medial lower leg tenderness with palpation  LUMBAR ROM:   Active  AROM  eval  Flexion 50% limited (compensation to R)  Extension 75% limited (pain)  Right lateral flexion   Left lateral flexion   Right rotation 50% limited  Left rotation 25% limited   (Blank rows = not tested)  LOWER EXTREMITY ROM:     PROM/AROM Right eval Left eval  Hip flexion WFL 119/ 90 deg.  Hip extension    Hip abduction [facility]  Hip adduction    Hip internal rotation    Hip external rotation    Knee flexion WFL 106/ 99 deg.  Knee extension    Ankle dorsiflexion WFL Limited by pain  Ankle plantarflexion WFL Limited by pain  Ankle inversion    Ankle eversion     (Blank rows  = not tested)  LOWER EXTREMITY MMT:    MMT Right eval Left eval  Hip flexion 4/5 3/5  Hip extension    Hip abduction 4/5 3/5  Hip adduction 4/5 4/5  Hip internal rotation 5/5 unable  Hip external rotation 5/5 3/5  Knee flexion 5/5 3/5 (pain)  Knee extension 5/5 4/5  Ankle dorsiflexion 4+/5 3/5  Ankle plantarflexion 5/5 4/5  Ankle inversion    Ankle eversion     (Blank rows = not tested)  FUNCTIONAL TESTS:  5 times sit to stand: 25.4 seconds .  9/21:  14.06 seconds (marked improvement).    GAIT: Distance walked: in clinic Assistive device utilized: Quad cane small base Level of assistance: Modified independence Comments: R sided Trendelenburg.  L antalgic gait pattern with decrease stance on L with short R LE step length/ shuffling.  No arm swing. Walks with wide base of support, with mild supination of B feet. Pt had limited endurance, and significantly limited L hip flexion and DF to clear her foot during swing phase.     01/25/22: 5xSTS: 15.94 sec./ 14.94 sec.  (Marked improvement).      9/21:  5xSTS: 14.06 seconds (marked improvement).      Treatment:  03/17/22:   Discussed joining [place] to transition from skilled PT.     There.ex.:       Nustep L5 10 min. B UE/LE at seat position 9-10 (consistent cadence)-  0.51 miles.   Walking in //-bars with no UE assist (forward/ lateral) 5 laps each (no ankle wts).  Working on improving with no UE assist.     Tandem stance/ walking in //-bars (light finger support with tandem gait).  Pt. Able to stance for >20 sec. In tandem stance.     Resisted gait in //-bars: 2BTB 5x all 4-planes of movement.  No UE assist.  Cuing to correct upright posture.    Standing 1st/2nd step touches at stairs.  Pt. Requires at least 1 UE assist for safety/ balance.  Moderate LE muscle fatigue/ R foot uncomfortable.     Walking in PT clinic/ hallway with consistent recip. Gait pattern with use of SPC.  No cuing today to correct upright  posture/ head position.  No LOB.      PATIENT EDUCATION:  Education details: Access Code: [id number] Person educated: Patient Education method: Explanation, Demonstration, and Handouts Education comprehension: verbalized understanding and returned demonstration   HOME EXERCISE PROGRAM: Access Code: [email or web address] URL: [email or web address] Date: 11/25/2021 Prepared by: Dorcas Carrow  Exercises - Straight Leg Raise  - 1 x daily - 7 x weekly - 2 sets - 10 reps - Supine Heel Slide  - 1 x daily - 7 x weekly - 2 sets - 10 reps - Supine Hip Adduction Isometric with Ball  - 1 x daily - 7 x weekly - 2 sets - 10 reps - Hooklying Gluteal Sets  - 1 x daily - 7 x weekly - 2 sets - 10 reps - Seated Heel Raise  - 1 x daily - 7 x weekly - 2 sets - 10 reps - Seated Heel Toe Raises  - 1 x daily - 7 x weekly - 2 sets - 10 reps  ASSESSMENT:  CLINICAL IMPRESSION: Pt. Demonstrates improvement in tandem stance/ walking in //-bars with no UE assist.  Pt. Improving walking endurance with use of SPC and consistent reciprocal gait pattern with indoor/ outdoor surfaces.  Pt. Able to ambulate short distances without SPC but moderate increase in antalgic gait pattern.  No LOB during tx. Session.  Pt. Benefits from //-bars or handrails with at least 1 UE assist.  Pt will benefit from further skilled PT in order to strengthen her LE's, and to progress her gait mechanics.  OBJECTIVE IMPAIRMENTS Abnormal gait, decreased activity tolerance, decreased balance, decreased endurance, decreased mobility, difficulty walking, decreased ROM, decreased strength, hypomobility, impaired flexibility, impaired sensation, improper body mechanics, postural dysfunction, obesity, and pain.   ACTIVITY LIMITATIONS carrying, lifting, bending, sitting, standing, squatting, sleeping, stairs, transfers, bed mobility, dressing, and locomotion level  PARTICIPATION LIMITATIONS: cleaning, laundry, driving, shopping, community activity,  occupation, and yard work  PERSONAL FACTORS Age, Education, and Past/current experiences are also affecting patient's functional outcome.   REHAB POTENTIAL: Good  CLINICAL DECISION MAKING: Evolving/moderate complexity  EVALUATION COMPLEXITY: Moderate   GOALS: Goals reviewed with patient? Yes  SHORT TERM GOALS: Target date: 03/24/22  Pt will ambulate through the clinic with a symmetrical gait pattern, with equal step length and decreased pain for > 100 ft.  Baseline: Asymmetrical gait - increased L step length and decreased R step length Goal status: Partially met   LONG TERM GOALS: Target date: 04/21/22  Pt will increase FOTO score to > 58 Baseline: 9/7: 52 (no significant changes) Goal status: Not met  2.  Pt will decrease pain level to < 2/10 during all ADLs, in order to efficiently accomplish activities with decreased pain and increased independence Baseline:  Goal status: Partially met  3.  Pt will increase Active L knee flexion ROM to > 120 degrees  Baseline: 99, 106 passive.  8/8: 118 deg.   Goal status: Partially met    PLAN: PT FREQUENCY: 2x/week  PT DURATION: 8 weeks  PLANNED INTERVENTIONS: Therapeutic exercises, Therapeutic activity, Neuromuscular re-education, Balance training, Gait training, Patient/Family education, Joint mobilization, Stair training, Aquatic Therapy, Cryotherapy, Moist heat, and Manual therapy.  PLAN FOR NEXT SESSION: Strengthening L LE and Hip flexion / hip abduction and stretch L iliopsoas. Gait training. Endurance emphasis.      Pura Spice, PT, DPT # [phone] 03/17/2022, 10:43 AM [place] [facility] [address] [place], [place], [address] Phone: [phone]   Fax:  [phone]

## 2022-03-23 ENCOUNTER — Ambulatory Visit: Payer: BC Managed Care – PPO | Attending: Trauma Surgery | Admitting: Physical Therapy

## 2022-03-23 DIAGNOSIS — M6281 Muscle weakness (generalized): Secondary | ICD-10-CM | POA: Diagnosis present

## 2022-03-23 DIAGNOSIS — M25552 Pain in left hip: Secondary | ICD-10-CM | POA: Diagnosis present

## 2022-03-23 DIAGNOSIS — R269 Unspecified abnormalities of gait and mobility: Secondary | ICD-10-CM | POA: Diagnosis present

## 2022-03-23 DIAGNOSIS — M25652 Stiffness of left hip, not elsewhere classified: Secondary | ICD-10-CM | POA: Diagnosis present

## 2022-03-23 DIAGNOSIS — R2689 Other abnormalities of gait and mobility: Secondary | ICD-10-CM

## 2022-03-23 DIAGNOSIS — M5459 Other low back pain: Secondary | ICD-10-CM | POA: Diagnosis present

## 2022-03-23 NOTE — Therapy (Signed)
OUTPATIENT PHYSICAL THERAPY THORACOLUMBAR TREATMENT   Patient Name: Yesenia Stevens MRN: [id number] DOB:17-Jul-1965, 56 y.o., female Today's Date: 03/23/2022   PT End of Session -     Visit Number 26   Number of Visits  35   Date for PT Re-Evaluation 04/21/22    PT Start Time 0941 to 1497 (46 minutes)    Equipment Utilized During Treatment No gait belt   Activity Tolerance Patient limited by pain;Patient limited by fatigue    Behavior During Therapy [facility] for tasks assessed/performed          Past Medical History:  Diagnosis Date   Diabetes mellitus without complication ([place])    Hypertension    Past Surgical History:  Procedure Laterality Date   CESAREAN SECTION     CHOLECYSTECTOMY     COLONOSCOPY WITH PROPOFOL N/A 12/10/2019   Procedure: COLONOSCOPY WITH PROPOFOL;  Surgeon: [name], MD;  Location: [facility] ENDOSCOPY;  Service: Gastroenterology;  Laterality: N/A;   COLONOSCOPY WITH PROPOFOL N/A 12/24/2019   Procedure: COLONOSCOPY WITH PROPOFOL;  Surgeon: [name], MD;  Location: [facility] ENDOSCOPY;  Service: Gastroenterology;  Laterality: N/A;   FRACTURE SURGERY     Patient Active Problem List   Diagnosis Date Noted   Hyperlipidemia associated with type 2 diabetes mellitus ([place]) 09/22/2021   Palpitations 03/19/2020   Diarrhea 03/19/2020   Premature atrial contractions 08/20/2018   Morbid (severe) obesity due to excess calories ([place]) 08/01/2018   Shortness of breath 07/26/2018   Chronic cough 02/63/7858   Uncomplicated type 2 diabetes mellitus ([place]) 03/22/2018   Hypertension 03/22/2018    PCP: Jon Billings, NP   REFERRING PROVIDER: Katherine Mantle, MD  REFERRING DIAG:  Diagnosis  S32.15XD (ICD-10-CM) - Type 2 fracture of sacrum, subsequent encounter for fracture with routine healing  S32.422D (ICD-10-CM) - Displaced fracture of posterior wall of left acetabulum, subsequent encounter for fracture with routine healing    Rationale for Evaluation and  Treatment Rehabilitation  THERAPY DIAG:  Stiffness of left hip joint  Other low back pain  Gait difficulty  Pain in left hip  Balance problems  Muscle weakness (generalized)  ONSET DATE: 05/28/21  SUBJECTIVE:                                                                                                                                                                                           SUBJECTIVE STATEMENT: 03/23/22:  Pt. Reports no new complaints.  Pt. Reports no pain while walking into PT clinic.  Pt. Planning to go to the [facility] today.    PERTINENT HISTORY:  Hx of L hip dislocation, and screws placed in lower  back after her injury. Pt left hospital in a walker, and utilized a WC originally during [facility]. Pt has decreased ankle DF in L ankle, and pain in her lateral foot / 5th metatarsal. Pt had L knee scope, and has tenderness/bruising in L LE along tibia.  PAIN:  Are you having pain? NPRS scale: 2/10 Pain location: L hip (anterior) Pain description: popping and dull/achy Aggravating factors: standing, bending forward (especially left) Relieving factors: sitting down, tylenol PRN   PRECAUTIONS: Anterior hip, Posterior hip, Knee, and Fall  WEIGHT BEARING RESTRICTIONS no  FALLS:  Has patient fallen in last 6 months? Yes. Number of falls 1  LIVING ENVIRONMENT: Lives with: lives with their family Lives in: House/apartment Stairs: yes - 2 to get into home with raining Has following equipment at home: Quad cane large base and Wheelchair (manual)  OCCUPATION:  Pt is currently on long term disability. Previously worked at [employer] prior to injury. Would like to get back to work if the company can find a position that is more stationary.  PLOF: Independent  PATIENT GOALS Pt wants to alleviate pain, become more mobile, strengthen back musculature, strengthen LEs   OBJECTIVE:   DIAGNOSTIC FINDINGS:  LUMBAR SPINE - 2-3 VIEW   COMPARISON:  MRI 09/28/2009    FINDINGS: 4-5 mm retrolisthesis L5-S1, slightly progressive since prior examination, possibly degenerative in nature. Otherwise normal lumbar lordosis. No acute fracture of the lumbar spine. Vertebral body height is preserved. Intervertebral disc space narrowing and endplate remodeling at X8-3 and L5-S1 is in keeping with mild degenerative disc disease at these levels. Paraspinal soft tissues are unremarkable. Right sacroiliac arthrodesis has been performed with 2 partially threaded screws.   IMPRESSION: No acute fracture or traumatic listhesis.   Degenerative changes L4-S1 with 5 mm retrolisthesis L5-S1.     Electronically Signed   By: Fidela Salisbury M.D.   On: 09/17/2021 22:13  PATIENT SURVEYS:  FOTO 52 ; 61  SCREENING FOR RED FLAGS: Bowel or bladder incontinence: No Spinal tumors: No Cauda equina syndrome: No Compression fracture: No Abdominal aneurysm: No  COGNITION:  Overall cognitive status: Within functional limits for tasks assessed     SENSATION: Not tested - patient reports altered sensation in L LE/foot  POSTURE: rounded shoulders, forward head, and weight shift left  PALPATION: Moderate L medial lower leg tenderness with palpation  LUMBAR ROM:   Active  AROM  eval  Flexion 50% limited (compensation to R)  Extension 75% limited (pain)  Right lateral flexion   Left lateral flexion   Right rotation 50% limited  Left rotation 25% limited   (Blank rows = not tested)  LOWER EXTREMITY ROM:     PROM/AROM Right eval Left eval  Hip flexion WFL 119/ 90 deg.  Hip extension    Hip abduction [facility]  Hip adduction    Hip internal rotation    Hip external rotation    Knee flexion WFL 106/ 99 deg.  Knee extension    Ankle dorsiflexion WFL Limited by pain  Ankle plantarflexion WFL Limited by pain  Ankle inversion    Ankle eversion     (Blank rows = not tested)  LOWER EXTREMITY MMT:    MMT Right eval Left eval  Hip flexion 4/5 3/5  Hip  extension    Hip abduction 4/5 3/5  Hip adduction 4/5 4/5  Hip internal rotation 5/5 unable  Hip external rotation 5/5 3/5  Knee flexion 5/5 3/5 (pain)  Knee extension 5/5 4/5  Ankle dorsiflexion 4+/5 3/5  Ankle plantarflexion 5/5 4/5  Ankle inversion    Ankle eversion     (Blank rows = not tested)  FUNCTIONAL TESTS:  5 times sit to stand: 25.4 seconds .  9/21:  14.06 seconds (marked improvement).    GAIT: Distance walked: in clinic Assistive device utilized: Quad cane small base Level of assistance: Modified independence Comments: R sided Trendelenburg.  L antalgic gait pattern with decrease stance on L with short R LE step length/ shuffling.  No arm swing. Walks with wide base of support, with mild supination of B feet. Pt had limited endurance, and significantly limited L hip flexion and DF to clear her foot during swing phase.     01/25/22: 5xSTS: 15.94 sec./ 14.94 sec.  (Marked improvement).      9/21:  5xSTS: 14.06 seconds (marked improvement).      Treatment:  03/23/22:     There.ex.:       Walked around side of building with use of SPC and recip. Gait pattern.      Nustep L5 10 min. B UE/LE at seat position 10 (consistent cadence)-  0.51 miles.                 Walking in //-bars with no UE assist (forward/ lateral) 5 laps each (no ankle wts).  Working on improving with no UE assist.  Attempted 4# ankle wts. But pain limited so stopped.     Tandem walking in //-bars (light finger support with tandem gait).     Resisted gait in //-bars: 2BTB 5x all 4-planes of movement.  No UE assist.  Cuing to correct upright posture.    Walking in PT clinic/ hallway with consistent recip. Gait pattern with use of SPC.  No cuing today to correct upright posture/ head position.  No LOB.      PATIENT EDUCATION:  Education details: Access Code: [phone] Person educated: Patient Education method: Explanation, Demonstration, and Handouts Education comprehension: verbalized  understanding and returned demonstration   HOME EXERCISE PROGRAM: Access Code: [email or web address] URL: [email or web address] Date: 11/25/2021 Prepared by: Dorcas Carrow  Exercises - Straight Leg Raise  - 1 x daily - 7 x weekly - 2 sets - 10 reps - Supine Heel Slide  - 1 x daily - 7 x weekly - 2 sets - 10 reps - Supine Hip Adduction Isometric with Ball  - 1 x daily - 7 x weekly - 2 sets - 10 reps - Hooklying Gluteal Sets  - 1 x daily - 7 x weekly - 2 sets - 10 reps - Seated Heel Raise  - 1 x daily - 7 x weekly - 2 sets - 10 reps - Seated Heel Toe Raises  - 1 x daily - 7 x weekly - 2 sets - 10 reps  ASSESSMENT:  CLINICAL IMPRESSION: Pt. Improving walking endurance with use of SPC and consistent reciprocal gait pattern with indoor/ outdoor surfaces.  Pt. Able to ambulate short distances without SPC but moderate increase in antalgic gait pattern.  No LOB during tx. Session.  Pt. Benefits from //-bars or handrails with at least 1 UE assist.  Pt will benefit from further skilled PT in order to strengthen her LE's, and to progress her gait mechanics.   OBJECTIVE IMPAIRMENTS Abnormal gait, decreased activity tolerance, decreased balance, decreased endurance, decreased mobility, difficulty walking, decreased ROM, decreased strength, hypomobility, impaired flexibility, impaired sensation, improper body mechanics, postural dysfunction, obesity, and pain.   ACTIVITY LIMITATIONS carrying, lifting, bending, sitting,  standing, squatting, sleeping, stairs, transfers, bed mobility, dressing, and locomotion level  PARTICIPATION LIMITATIONS: cleaning, laundry, driving, shopping, community activity, occupation, and yard work  PERSONAL FACTORS Age, Education, and Past/current experiences are also affecting patient's functional outcome.   REHAB POTENTIAL: Good  CLINICAL DECISION MAKING: Evolving/moderate complexity  EVALUATION COMPLEXITY: Moderate   GOALS: Goals reviewed with patient?  Yes  SHORT TERM GOALS: Target date: 03/24/22  Pt will ambulate through the clinic with a symmetrical gait pattern, with equal step length and decreased pain for > 100 ft.  Baseline: Asymmetrical gait - increased L step length and decreased R step length Goal status: Partially met   LONG TERM GOALS: Target date: 04/21/22  Pt will increase FOTO score to > 58 Baseline: 9/7: 52 (no significant changes) Goal status: Not met  2.  Pt will decrease pain level to < 2/10 during all ADLs, in order to efficiently accomplish activities with decreased pain and increased independence Baseline:  Goal status: Partially met  3.  Pt will increase Active L knee flexion ROM to > 120 degrees  Baseline: 99, 106 passive.  8/8: 118 deg.   Goal status: Partially met    PLAN: PT FREQUENCY: 2x/week  PT DURATION: 8 weeks  PLANNED INTERVENTIONS: Therapeutic exercises, Therapeutic activity, Neuromuscular re-education, Balance training, Gait training, Patient/Family education, Joint mobilization, Stair training, Aquatic Therapy, Cryotherapy, Moist heat, and Manual therapy.  PLAN FOR NEXT SESSION: Strengthening L LE and Hip flexion / hip abduction and stretch L iliopsoas. Gait training. Endurance emphasis.      Pura Spice, PT, DPT # [phone] 03/23/2022, 11:33 AM [place] [facility] [address] [place], [place], [address] Phone: [phone]   Fax:  [phone]

## 2022-03-29 ENCOUNTER — Ambulatory Visit: Payer: BC Managed Care – PPO | Admitting: Physical Therapy

## 2022-03-29 DIAGNOSIS — M25552 Pain in left hip: Secondary | ICD-10-CM

## 2022-03-29 DIAGNOSIS — M25652 Stiffness of left hip, not elsewhere classified: Secondary | ICD-10-CM

## 2022-03-29 DIAGNOSIS — M5459 Other low back pain: Secondary | ICD-10-CM

## 2022-03-29 DIAGNOSIS — R2689 Other abnormalities of gait and mobility: Secondary | ICD-10-CM

## 2022-03-29 DIAGNOSIS — R269 Unspecified abnormalities of gait and mobility: Secondary | ICD-10-CM

## 2022-03-29 DIAGNOSIS — M6281 Muscle weakness (generalized): Secondary | ICD-10-CM

## 2022-03-29 NOTE — Therapy (Signed)
OUTPATIENT PHYSICAL THERAPY THORACOLUMBAR TREATMENT   Patient Name: Yesenia Stevens MRN: [id number] DOB:May 14, 1966, 56 y.o., female Today's Date: 03/29/2022   PT End of Session -     Visit Number 27   Number of Visits  35   Date for PT Re-Evaluation 04/21/22    PT Start Time 0903 to 0948  (45 minutes)    Equipment Utilized During Treatment No gait belt   Activity Tolerance Patient limited by pain;Patient limited by fatigue    Behavior During Therapy [facility] for tasks assessed/performed          Past Medical History:  Diagnosis Date   Diabetes mellitus without complication ([place])    Hypertension    Past Surgical History:  Procedure Laterality Date   CESAREAN SECTION     CHOLECYSTECTOMY     COLONOSCOPY WITH PROPOFOL N/A 12/10/2019   Procedure: COLONOSCOPY WITH PROPOFOL;  Surgeon: [name], MD;  Location: [facility] ENDOSCOPY;  Service: Gastroenterology;  Laterality: N/A;   COLONOSCOPY WITH PROPOFOL N/A 12/24/2019   Procedure: COLONOSCOPY WITH PROPOFOL;  Surgeon: [name], MD;  Location: [facility] ENDOSCOPY;  Service: Gastroenterology;  Laterality: N/A;   FRACTURE SURGERY     Patient Active Problem List   Diagnosis Date Noted   Hyperlipidemia associated with type 2 diabetes mellitus ([place]) 09/22/2021   Palpitations 03/19/2020   Diarrhea 03/19/2020   Premature atrial contractions 08/20/2018   Morbid (severe) obesity due to excess calories ([place]) 08/01/2018   Shortness of breath 07/26/2018   Chronic cough 06/12/8249   Uncomplicated type 2 diabetes mellitus ([place]) 03/22/2018   Hypertension 03/22/2018    PCP: Jon Billings, NP   REFERRING PROVIDER: Katherine Mantle, MD  REFERRING DIAG:  Diagnosis  S32.15XD (ICD-10-CM) - Type 2 fracture of sacrum, subsequent encounter for fracture with routine healing  S32.422D (ICD-10-CM) - Displaced fracture of posterior wall of left acetabulum, subsequent encounter for fracture with routine healing    Rationale for Evaluation and  Treatment Rehabilitation  THERAPY DIAG:  Stiffness of left hip joint  Other low back pain  Gait difficulty  Pain in left hip  Balance problems  Muscle weakness (generalized)  ONSET DATE: 05/28/21  SUBJECTIVE:                                                                                                                                                                                           SUBJECTIVE STATEMENT: 03/29/22:  Pt. Reports no new complaints.  Pt. Entered PT with use of SPC and recip. Step pattern.   PERTINENT HISTORY:  Hx of L hip dislocation, and screws placed in lower back after her injury. Pt left hospital  in a walker, and utilized a WC originally during [facility]. Pt has decreased ankle DF in L ankle, and pain in her lateral foot / 5th metatarsal. Pt had L knee scope, and has tenderness/bruising in L LE along tibia.  PAIN:  Are you having pain? NPRS scale: 2/10 Pain location: L hip (anterior) Pain description: popping and dull/achy Aggravating factors: standing, bending forward (especially left) Relieving factors: sitting down, tylenol PRN   PRECAUTIONS: Anterior hip, Posterior hip, Knee, and Fall  WEIGHT BEARING RESTRICTIONS no  FALLS:  Has patient fallen in last 6 months? Yes. Number of falls 1  LIVING ENVIRONMENT: Lives with: lives with their family Lives in: House/apartment Stairs: yes - 2 to get into home with raining Has following equipment at home: Quad cane large base and Wheelchair (manual)  OCCUPATION:  Pt is currently on long term disability. Previously worked at [employer] prior to injury. Would like to get back to work if the company can find a position that is more stationary.  PLOF: Independent  PATIENT GOALS Pt wants to alleviate pain, become more mobile, strengthen back musculature, strengthen LEs   OBJECTIVE:   DIAGNOSTIC FINDINGS:  LUMBAR SPINE - 2-3 VIEW   COMPARISON:  MRI 09/28/2009   FINDINGS: 4-5 mm retrolisthesis  L5-S1, slightly progressive since prior examination, possibly degenerative in nature. Otherwise normal lumbar lordosis. No acute fracture of the lumbar spine. Vertebral body height is preserved. Intervertebral disc space narrowing and endplate remodeling at N1-7 and L5-S1 is in keeping with mild degenerative disc disease at these levels. Paraspinal soft tissues are unremarkable. Right sacroiliac arthrodesis has been performed with 2 partially threaded screws.   IMPRESSION: No acute fracture or traumatic listhesis.   Degenerative changes L4-S1 with 5 mm retrolisthesis L5-S1.     Electronically Signed   By: Fidela Salisbury M.D.   On: 09/17/2021 22:13  PATIENT SURVEYS:  FOTO 52 ; 61  SCREENING FOR RED FLAGS: Bowel or bladder incontinence: No Spinal tumors: No Cauda equina syndrome: No Compression fracture: No Abdominal aneurysm: No  COGNITION:  Overall cognitive status: Within functional limits for tasks assessed     SENSATION: Not tested - patient reports altered sensation in L LE/foot  POSTURE: rounded shoulders, forward head, and weight shift left  PALPATION: Moderate L medial lower leg tenderness with palpation  LUMBAR ROM:   Active  AROM  eval  Flexion 50% limited (compensation to R)  Extension 75% limited (pain)  Right lateral flexion   Left lateral flexion   Right rotation 50% limited  Left rotation 25% limited   (Blank rows = not tested)  LOWER EXTREMITY ROM:     PROM/AROM Right eval Left eval  Hip flexion WFL 119/ 90 deg.  Hip extension    Hip abduction [facility]  Hip adduction    Hip internal rotation    Hip external rotation    Knee flexion WFL 106/ 99 deg.  Knee extension    Ankle dorsiflexion WFL Limited by pain  Ankle plantarflexion WFL Limited by pain  Ankle inversion    Ankle eversion     (Blank rows = not tested)  LOWER EXTREMITY MMT:    MMT Right eval Left eval  Hip flexion 4/5 3/5  Hip extension    Hip abduction 4/5 3/5  Hip  adduction 4/5 4/5  Hip internal rotation 5/5 unable  Hip external rotation 5/5 3/5  Knee flexion 5/5 3/5 (pain)  Knee extension 5/5 4/5  Ankle dorsiflexion 4+/5 3/5  Ankle plantarflexion  5/5 4/5  Ankle inversion    Ankle eversion     (Blank rows = not tested)  FUNCTIONAL TESTS:  5 times sit to stand: 25.4 seconds .  9/21:  14.06 seconds (marked improvement).    GAIT: Distance walked: in clinic Assistive device utilized: Quad cane small base Level of assistance: Modified independence Comments: R sided Trendelenburg.  L antalgic gait pattern with decrease stance on L with short R LE step length/ shuffling.  No arm swing. Walks with wide base of support, with mild supination of B feet. Pt had limited endurance, and significantly limited L hip flexion and DF to clear her foot during swing phase.     01/25/22: 5xSTS: 15.94 sec./ 14.94 sec.  (Marked improvement).      9/21:  5xSTS: 14.06 seconds (marked improvement).      Treatment:  03/29/22:     There.ex.:       Nustep L5 10+ min. B UE/LE at seat position 10-9 (consistent cadence)-  0.51 miles.     Agility ladder: lateral/ forward stepping patterns with and without use of SPC.     2nd step touches L/R progressing to R UE assist only 20x.  Recip. Stairs climbing with B UE assist required for balance/safety.  6x.                 Walking in //-bars with no UE assist (forward/ lateral) 5 laps each (no ankle wts).  Working on improving with no UE assist.  No ankle wts.     Tandem walking in //-bars (light finger support with tandem gait).     Resisted gait in //-bars: 2BTB 10x lateral walking.  No UE assist.  Cuing to correct upright posture.  Slight increase L hip discomfort with R lateral walking with resistance.     Walking in PT clinic/ hallway with consistent recip. Gait pattern with use of SPC.  No cuing today to correct upright posture/ head position.  No LOB.      PATIENT EDUCATION:  Education details: Access Code:  [phone] Person educated: Patient Education method: Explanation, Demonstration, and Handouts Education comprehension: verbalized understanding and returned demonstration   HOME EXERCISE PROGRAM: Access Code: [email or web address] URL: [email or web address] Date: 11/25/2021 Prepared by: Dorcas Carrow  Exercises - Straight Leg Raise  - 1 x daily - 7 x weekly - 2 sets - 10 reps - Supine Heel Slide  - 1 x daily - 7 x weekly - 2 sets - 10 reps - Supine Hip Adduction Isometric with Ball  - 1 x daily - 7 x weekly - 2 sets - 10 reps - Hooklying Gluteal Sets  - 1 x daily - 7 x weekly - 2 sets - 10 reps - Seated Heel Raise  - 1 x daily - 7 x weekly - 2 sets - 10 reps - Seated Heel Toe Raises  - 1 x daily - 7 x weekly - 2 sets - 10 reps  ASSESSMENT:  CLINICAL IMPRESSION: Pt. Improving walking endurance with use of SPC and consistent reciprocal gait pattern with indoor/ outdoor surfaces.  Pt. Able to ambulate short distances without SPC but moderate increase in antalgic gait pattern.  No LOB during agility ladder ex. With light use of SPC.  Pt. Benefits from //-bars or handrails with at least 1 UE assist.  Pt will benefit from further skilled PT in order to strengthen her LE's, and to progress her gait mechanics.   OBJECTIVE IMPAIRMENTS Abnormal gait, decreased activity tolerance, decreased balance, decreased  endurance, decreased mobility, difficulty walking, decreased ROM, decreased strength, hypomobility, impaired flexibility, impaired sensation, improper body mechanics, postural dysfunction, obesity, and pain.   ACTIVITY LIMITATIONS carrying, lifting, bending, sitting, standing, squatting, sleeping, stairs, transfers, bed mobility, dressing, and locomotion level  PARTICIPATION LIMITATIONS: cleaning, laundry, driving, shopping, community activity, occupation, and yard work  PERSONAL FACTORS Age, Education, and Past/current experiences are also affecting patient's functional outcome.   REHAB  POTENTIAL: Good  CLINICAL DECISION MAKING: Evolving/moderate complexity  EVALUATION COMPLEXITY: Moderate   GOALS: Goals reviewed with patient? Yes  SHORT TERM GOALS: Target date: 03/24/22  Pt will ambulate through the clinic with a symmetrical gait pattern, with equal step length and decreased pain for > 100 ft.  Baseline: Asymmetrical gait - increased L step length and decreased R step length Goal status: Partially met   LONG TERM GOALS: Target date: 04/21/22  Pt will increase FOTO score to > 58 Baseline: 9/7: 52 (no significant changes) Goal status: Not met  2.  Pt will decrease pain level to < 2/10 during all ADLs, in order to efficiently accomplish activities with decreased pain and increased independence Baseline:  Goal status: Partially met  3.  Pt will increase Active L knee flexion ROM to > 120 degrees  Baseline: 99, 106 passive.  8/8: 118 deg.   Goal status: Partially met    PLAN: PT FREQUENCY: 2x/week  PT DURATION: 8 weeks  PLANNED INTERVENTIONS: Therapeutic exercises, Therapeutic activity, Neuromuscular re-education, Balance training, Gait training, Patient/Family education, Joint mobilization, Stair training, Aquatic Therapy, Cryotherapy, Moist heat, and Manual therapy.  PLAN FOR NEXT SESSION: Strengthening L LE and Hip flexion / hip abduction and stretch L iliopsoas. Gait training. Endurance emphasis.      Pura Spice, PT, DPT # [phone] 03/29/2022, 9:10 AM [facility] [address] [place], [place], [address] Phone: [phone]   Fax:  [phone]

## 2022-03-30 ENCOUNTER — Ambulatory Visit
Admission: RE | Admit: 2022-03-30 | Discharge: 2022-03-30 | Disposition: A | Discharge status: Home / Self Care | Payer: BC Managed Care – PPO | Source: Ambulatory Visit | Attending: Nurse Practitioner | Admitting: Nurse Practitioner

## 2022-03-30 DIAGNOSIS — Z1231 Encounter for screening mammogram for malignant neoplasm of breast: Secondary | ICD-10-CM | POA: Diagnosis present

## 2022-03-30 NOTE — Progress Notes (Signed)
Please let patient know her Mammogram did not show any evidence of a malignancy.  The recommendation is to repeat the Mammogram in 1 year.  

## 2022-04-05 ENCOUNTER — Ambulatory Visit: Payer: BC Managed Care – PPO | Admitting: Physical Therapy

## 2022-04-05 ENCOUNTER — Encounter: Payer: Self-pay | Admitting: Physical Therapy

## 2022-04-05 DIAGNOSIS — M25552 Pain in left hip: Secondary | ICD-10-CM

## 2022-04-05 DIAGNOSIS — M25652 Stiffness of left hip, not elsewhere classified: Secondary | ICD-10-CM | POA: Diagnosis not present

## 2022-04-05 DIAGNOSIS — R2689 Other abnormalities of gait and mobility: Secondary | ICD-10-CM

## 2022-04-05 DIAGNOSIS — M6281 Muscle weakness (generalized): Secondary | ICD-10-CM

## 2022-04-05 DIAGNOSIS — M5459 Other low back pain: Secondary | ICD-10-CM

## 2022-04-05 DIAGNOSIS — R269 Unspecified abnormalities of gait and mobility: Secondary | ICD-10-CM

## 2022-04-05 NOTE — Therapy (Signed)
OUTPATIENT PHYSICAL THERAPY THORACOLUMBAR TREATMENT   Patient Name: Yesenia Stevens MRN: [id number] DOB:1965/06/30, 56 y.o., female Today's Date: 04/05/2022   PT End of Session -     Visit Number 28   Number of Visits  35   Date for PT Re-Evaluation 04/21/22    PT Start Time 0786 to 1732  (45 minutes)    Equipment Utilized During Treatment No gait belt   Activity Tolerance Patient limited by pain;Patient limited by fatigue    Behavior During Therapy [facility] for tasks assessed/performed          Past Medical History:  Diagnosis Date   Diabetes mellitus without complication ([place])    Hypertension    Past Surgical History:  Procedure Laterality Date   CESAREAN SECTION     CHOLECYSTECTOMY     COLONOSCOPY WITH PROPOFOL N/A 12/10/2019   Procedure: COLONOSCOPY WITH PROPOFOL;  Surgeon: [name], MD;  Location[facility] ENDOSCOPY;  Service: Gastroenterology;  Laterality: N/A;   COLONOSCOPY WITH PROPOFOL N/A 12/24/2019   Procedure: COLONOSCOPY WITH PROPOFOL;  Surgeon: [name], MD;  Location: [facility] ENDOSCOPY;  Service: Gastroenterology;  Laterality: N/A;   FRACTURE SURGERY     Patient Active Problem List   Diagnosis Date Noted   Hyperlipidemia associated with type 2 diabetes mellitus ([place]) 09/22/2021   Palpitations 03/19/2020   Diarrhea 03/19/2020   Premature atrial contractions 08/20/2018   Morbid (severe) obesity due to excess calories ([place]) 08/01/2018   Shortness of breath 07/26/2018   Chronic cough 75/44/9201   Uncomplicated type 2 diabetes mellitus ([place]) 03/22/2018   Hypertension 03/22/2018    PCP: Jon Billings, NP   REFERRING PROVIDER: Katherine Mantle, MD  REFERRING DIAG:  Diagnosis  S32.15XD (ICD-10-CM) - Type 2 fracture of sacrum, subsequent encounter for fracture with routine healing  S32.422D (ICD-10-CM) - Displaced fracture of posterior wall of left acetabulum, subsequent encounter for fracture with routine healing    Rationale for Evaluation and  Treatment Rehabilitation  THERAPY DIAG:  Stiffness of left hip joint  Other low back pain  Gait difficulty  Pain in left hip  Balance problems  Muscle weakness (generalized)  ONSET DATE: 05/28/21  SUBJECTIVE:                                                                                                                                                                                           SUBJECTIVE STATEMENT: 04/05/22:  Pt. Reports no back or hip pain prior to tx. session.  Pt. Entered PT with use of SPC and recip. Step pattern.  Pt. Has not started going to gym at this time due to congestion/ sinus issues.  PERTINENT HISTORY:  Hx of L hip dislocation, and screws placed in lower back after her injury. Pt left hospital in a walker, and utilized a WC originally during [facility]. Pt has decreased ankle DF in L ankle, and pain in her lateral foot / 5th metatarsal. Pt had L knee scope, and has tenderness/bruising in L LE along tibia.  PAIN:  Are you having pain? NPRS scale: 0/10 Pain location: L hip (anterior) Pain description: popping and dull/achy Aggravating factors: standing, bending forward (especially left) Relieving factors: sitting down, tylenol PRN   PRECAUTIONS: Anterior hip, Posterior hip, Knee, and Fall  WEIGHT BEARING RESTRICTIONS no  FALLS:  Has patient fallen in last 6 months? Yes. Number of falls 1  LIVING ENVIRONMENT: Lives with: lives with their family Lives in: House/apartment Stairs: yes - 2 to get into home with raining Has following equipment at home: Quad cane large base and Wheelchair (manual)  OCCUPATION:  Pt is currently on long term disability. Previously worked at [employer] prior to injury. Would like to get back to work if the company can find a position that is more stationary.  PLOF: Independent  PATIENT GOALS Pt wants to alleviate pain, become more mobile, strengthen back musculature, strengthen LEs   OBJECTIVE:   DIAGNOSTIC  FINDINGS:  LUMBAR SPINE - 2-3 VIEW   COMPARISON:  MRI 09/28/2009   FINDINGS: 4-5 mm retrolisthesis L5-S1, slightly progressive since prior examination, possibly degenerative in nature. Otherwise normal lumbar lordosis. No acute fracture of the lumbar spine. Vertebral body height is preserved. Intervertebral disc space narrowing and endplate remodeling at L4-5 and L5-S1 is in keeping with mild degenerative disc disease at these levels. Paraspinal soft tissues are unremarkable. Right sacroiliac arthrodesis has been performed with 2 partially threaded screws.   IMPRESSION: No acute fracture or traumatic listhesis.   Degenerative changes L4-S1 with 5 mm retrolisthesis L5-S1.     Electronically Signed   By: Fidela Salisbury M.D.   On: 09/17/2021 22:13  PATIENT SURVEYS:  FOTO 52 ; 61  SCREENING FOR RED FLAGS: Bowel or bladder incontinence: No Spinal tumors: No Cauda equina syndrome: No Compression fracture: No Abdominal aneurysm: No  COGNITION:  Overall cognitive status: Within functional limits for tasks assessed     SENSATION: Not tested - patient reports altered sensation in L LE/foot  POSTURE: rounded shoulders, forward head, and weight shift left  PALPATION: Moderate L medial lower leg tenderness with palpation  LUMBAR ROM:   Active  AROM  eval  Flexion 50% limited (compensation to R)  Extension 75% limited (pain)  Right lateral flexion   Left lateral flexion   Right rotation 50% limited  Left rotation 25% limited   (Blank rows = not tested)  LOWER EXTREMITY ROM:     PROM/AROM Right eval Left eval  Hip flexion WFL 119/ 90 deg.  Hip extension    Hip abduction [facility]  Hip adduction    Hip internal rotation    Hip external rotation    Knee flexion WFL 106/ 99 deg.  Knee extension    Ankle dorsiflexion WFL Limited by pain  Ankle plantarflexion WFL Limited by pain  Ankle inversion    Ankle eversion     (Blank rows = not tested)  LOWER EXTREMITY  MMT:    MMT Right eval Left eval  Hip flexion 4/5 3/5  Hip extension    Hip abduction 4/5 3/5  Hip adduction 4/5 4/5  Hip internal rotation 5/5 unable  Hip external rotation 5/5  3/5  Knee flexion 5/5 3/5 (pain)  Knee extension 5/5 4/5  Ankle dorsiflexion 4+/5 3/5  Ankle plantarflexion 5/5 4/5  Ankle inversion    Ankle eversion     (Blank rows = not tested)  FUNCTIONAL TESTS:  5 times sit to stand: 25.4 seconds .  9/21:  14.06 seconds (marked improvement).    GAIT: Distance walked: in clinic Assistive device utilized: Quad cane small base Level of assistance: Modified independence Comments: R sided Trendelenburg.  L antalgic gait pattern with decrease stance on L with short R LE step length/ shuffling.  No arm swing. Walks with wide base of support, with mild supination of B feet. Pt had limited endurance, and significantly limited L hip flexion and DF to clear her foot during swing phase.     01/25/22: 5xSTS: 15.94 sec./ 14.94 sec.  (Marked improvement).      9/21:  5xSTS: 14.06 seconds (marked improvement).      Treatment:  04/05/22:     There.ex.:       Nustep L5 10 min. B UE/LE at seat position 10-9 (consistent cadence)-  0.52 miles.     2nd step touches L/R progressing to R UE assist only 20x.     Walking in //-bars with 6" hurdles and recip. Step overs/ mirror feedback.  3 laps.      Resisted gait in //-bars: 2BTB 10x lateral walking.  No UE assist.  Cuing to correct upright posture.  Slight increase in L foot/ arch during forward and lateral walking.        TRX squats: 20x with controlled squat/ chair.               Walking in //-bars with no UE assist (forward/ backwards 5 laps each.  Working on improving wt. Bearing/ R LE step length with no UE assist.  No ankle wts.     Walking in PT clinic/ hallway with consistent recip. Gait pattern with use of SPC.  No cuing today to correct upright posture/ head position.  No LOB.      PATIENT EDUCATION:  Education  details: Access Code: [phone] Person educated: Patient Education method: Explanation, Demonstration, and Handouts Education comprehension: verbalized understanding and returned demonstration   HOME EXERCISE PROGRAM: Access Code: [email or web address] URL: [email or web address] Date: 11/25/2021 Prepared by: Dorcas Carrow  Exercises - Straight Leg Raise  - 1 x daily - 7 x weekly - 2 sets - 10 reps - Supine Heel Slide  - 1 x daily - 7 x weekly - 2 sets - 10 reps - Supine Hip Adduction Isometric with Ball  - 1 x daily - 7 x weekly - 2 sets - 10 reps - Hooklying Gluteal Sets  - 1 x daily - 7 x weekly - 2 sets - 10 reps - Seated Heel Raise  - 1 x daily - 7 x weekly - 2 sets - 10 reps - Seated Heel Toe Raises  - 1 x daily - 7 x weekly - 2 sets - 10 reps  ASSESSMENT:  CLINICAL IMPRESSION: Pt. Improving walking endurance with use of SPC and consistent reciprocal gait pattern with indoor/ outdoor surfaces.  Pt. Able to ambulate short distances without SPC but moderate increase in antalgic gait pattern.  Increase in hip/foot discomfort with resisted walking (lateral direction).  PT discussed starting a gym based ex. To focus on LE strengthening/ endurance.   Pt. Benefits from //-bars or handrails with at least 1 UE assist.  Pt will benefit from further skilled  PT in order to strengthen her LE's, and to progress her gait mechanics.   OBJECTIVE IMPAIRMENTS Abnormal gait, decreased activity tolerance, decreased balance, decreased endurance, decreased mobility, difficulty walking, decreased ROM, decreased strength, hypomobility, impaired flexibility, impaired sensation, improper body mechanics, postural dysfunction, obesity, and pain.   ACTIVITY LIMITATIONS carrying, lifting, bending, sitting, standing, squatting, sleeping, stairs, transfers, bed mobility, dressing, and locomotion level  PARTICIPATION LIMITATIONS: cleaning, laundry, driving, shopping, community activity, occupation, and yard  work  PERSONAL FACTORS Age, Education, and Past/current experiences are also affecting patient's functional outcome.   REHAB POTENTIAL: Good  CLINICAL DECISION MAKING: Evolving/moderate complexity  EVALUATION COMPLEXITY: Moderate   GOALS: Goals reviewed with patient? Yes  SHORT TERM GOALS: Target date: 03/24/22  Pt will ambulate through the clinic with a symmetrical gait pattern, with equal step length and decreased pain for > 100 ft.  Baseline: Asymmetrical gait - increased L step length and decreased R step length Goal status: Partially met   LONG TERM GOALS: Target date: 04/21/22  Pt will increase FOTO score to > 58 Baseline: 9/7: 52 (no significant changes) Goal status: Not met  2.  Pt will decrease pain level to < 2/10 during all ADLs, in order to efficiently accomplish activities with decreased pain and increased independence Baseline:  Goal status: Partially met  3.  Pt will increase Active L knee flexion ROM to > 120 degrees  Baseline: 99, 106 passive.  8/8: 118 deg.   Goal status: Partially met    PLAN: PT FREQUENCY: 2x/week  PT DURATION: 8 weeks  PLANNED INTERVENTIONS: Therapeutic exercises, Therapeutic activity, Neuromuscular re-education, Balance training, Gait training, Patient/Family education, Joint mobilization, Stair training, Aquatic Therapy, Cryotherapy, Moist heat, and Manual therapy.  PLAN FOR NEXT SESSION: Strengthening L LE and Hip flexion / hip abduction and stretch L iliopsoas. Gait training. Endurance emphasis.      Pura Spice, PT, DPT # [phone] 04/05/2022, 4:51 PM [facility] [address] [place], [place], [address] Phone: [phone]   Fax:  [phone]

## 2022-04-07 ENCOUNTER — Ambulatory Visit: Payer: BC Managed Care – PPO | Admitting: Physical Therapy

## 2022-04-07 DIAGNOSIS — M25652 Stiffness of left hip, not elsewhere classified: Secondary | ICD-10-CM | POA: Diagnosis not present

## 2022-04-07 DIAGNOSIS — R2689 Other abnormalities of gait and mobility: Secondary | ICD-10-CM

## 2022-04-07 DIAGNOSIS — M25552 Pain in left hip: Secondary | ICD-10-CM

## 2022-04-07 DIAGNOSIS — M6281 Muscle weakness (generalized): Secondary | ICD-10-CM

## 2022-04-07 DIAGNOSIS — R269 Unspecified abnormalities of gait and mobility: Secondary | ICD-10-CM

## 2022-04-07 DIAGNOSIS — M5459 Other low back pain: Secondary | ICD-10-CM

## 2022-04-07 NOTE — Therapy (Signed)
OUTPATIENT PHYSICAL THERAPY THORACOLUMBAR TREATMENT   Patient Name: Yesenia Stevens MRN: [id number] DOB:Sep 28, 1965, 56 y.o., female Today's Date: 04/07/2022   PT End of Session -     Visit Number 29   Number of Visits  35   Date for PT Re-Evaluation 04/21/22    PT Start Time 9983 to 3825  (50 minutes)    Equipment Utilized During Treatment No gait belt   Activity Tolerance Patient limited by pain;Patient limited by fatigue    Behavior During Therapy [facility] for tasks assessed/performed          Past Medical History:  Diagnosis Date   Diabetes mellitus without complication ([place])    Hypertension    Past Surgical History:  Procedure Laterality Date   CESAREAN SECTION     CHOLECYSTECTOMY     COLONOSCOPY WITH PROPOFOL N/A 12/10/2019   Procedure: COLONOSCOPY WITH PROPOFOL;  Surgeon: [name], MD;  Location: [facility] ENDOSCOPY;  Service: Gastroenterology;  Laterality: N/A;   COLONOSCOPY WITH PROPOFOL N/A 12/24/2019   Procedure: COLONOSCOPY WITH PROPOFOL;  Surgeon: [name], MD;  Location: [facility] ENDOSCOPY;  Service: Gastroenterology;  Laterality: N/A;   FRACTURE SURGERY     Patient Active Problem List   Diagnosis Date Noted   Hyperlipidemia associated with type 2 diabetes mellitus ([place]) 09/22/2021   Palpitations 03/19/2020   Diarrhea 03/19/2020   Premature atrial contractions 08/20/2018   Morbid (severe) obesity due to excess calories ([place]) 08/01/2018   Shortness of breath 07/26/2018   Chronic cough 05/39/7673   Uncomplicated type 2 diabetes mellitus ([place]) 03/22/2018   Hypertension 03/22/2018    PCP: Jon Billings, NP   REFERRING PROVIDER: Katherine Mantle, MD  REFERRING DIAG:  Diagnosis  S32.15XD (ICD-10-CM) - Type 2 fracture of sacrum, subsequent encounter for fracture with routine healing  S32.422D (ICD-10-CM) - Displaced fracture of posterior wall of left acetabulum, subsequent encounter for fracture with routine healing    Rationale for Evaluation and  Treatment Rehabilitation  THERAPY DIAG:  Stiffness of left hip joint  Other low back pain  Gait difficulty  Pain in left hip  Balance problems  Muscle weakness (generalized)  ONSET DATE: 05/28/21  SUBJECTIVE:                                                                                                                                                                                           SUBJECTIVE STATEMENT: 04/07/22:  Pt. Went to [employer] yesterday and rode bike.  Pt. Reports bike is not a stepper/Nustep but more of a stationary bike with back support (difficulty with L foot position).    PERTINENT HISTORY:  Hx of L  hip dislocation, and screws placed in lower back after her injury. Pt left hospital in a walker, and utilized a WC originally during [facility]. Pt has decreased ankle DF in L ankle, and pain in her lateral foot / 5th metatarsal. Pt had L knee scope, and has tenderness/bruising in L LE along tibia.  PAIN:  Are you having pain? NPRS scale: 0/10 Pain location: L hip (anterior) Pain description: popping and dull/achy Aggravating factors: standing, bending forward (especially left) Relieving factors: sitting down, tylenol PRN   PRECAUTIONS: Anterior hip, Posterior hip, Knee, and Fall  WEIGHT BEARING RESTRICTIONS no  FALLS:  Has patient fallen in last 6 months? Yes. Number of falls 1  LIVING ENVIRONMENT: Lives with: lives with their family Lives in: House/apartment Stairs: yes - 2 to get into home with raining Has following equipment at home: Quad cane large base and Wheelchair (manual)  OCCUPATION:  Pt is currently on long term disability. Previously worked at [employer] prior to injury. Would like to get back to work if the company can find a position that is more stationary.  PLOF: Independent  PATIENT GOALS Pt wants to alleviate pain, become more mobile, strengthen back musculature, strengthen LEs   OBJECTIVE:   DIAGNOSTIC FINDINGS:  LUMBAR SPINE -  2-3 VIEW   COMPARISON:  MRI 09/28/2009   FINDINGS: 4-5 mm retrolisthesis L5-S1, slightly progressive since prior examination, possibly degenerative in nature. Otherwise normal lumbar lordosis. No acute fracture of the lumbar spine. Vertebral body height is preserved. Intervertebral disc space narrowing and endplate remodeling at N4-0 and L5-S1 is in keeping with mild degenerative disc disease at these levels. Paraspinal soft tissues are unremarkable. Right sacroiliac arthrodesis has been performed with 2 partially threaded screws.   IMPRESSION: No acute fracture or traumatic listhesis.   Degenerative changes L4-S1 with 5 mm retrolisthesis L5-S1.     Electronically Signed   By: Fidela Salisbury M.D.   On: 09/17/2021 22:13  PATIENT SURVEYS:  FOTO 52 ; 61  SCREENING FOR RED FLAGS: Bowel or bladder incontinence: No Spinal tumors: No Cauda equina syndrome: No Compression fracture: No Abdominal aneurysm: No  COGNITION:  Overall cognitive status: Within functional limits for tasks assessed     SENSATION: Not tested - patient reports altered sensation in L LE/foot  POSTURE: rounded shoulders, forward head, and weight shift left  PALPATION: Moderate L medial lower leg tenderness with palpation  LUMBAR ROM:   Active  AROM  eval  Flexion 50% limited (compensation to R)  Extension 75% limited (pain)  Right lateral flexion   Left lateral flexion   Right rotation 50% limited  Left rotation 25% limited   (Blank rows = not tested)  LOWER EXTREMITY ROM:     PROM/AROM Right eval Left eval  Hip flexion WFL 119/ 90 deg.  Hip extension    Hip abduction [facility]  Hip adduction    Hip internal rotation    Hip external rotation    Knee flexion WFL 106/ 99 deg.  Knee extension    Ankle dorsiflexion WFL Limited by pain  Ankle plantarflexion WFL Limited by pain  Ankle inversion    Ankle eversion     (Blank rows = not tested)  LOWER EXTREMITY MMT:    MMT Right eval  Left eval  Hip flexion 4/5 3/5  Hip extension    Hip abduction 4/5 3/5  Hip adduction 4/5 4/5  Hip internal rotation 5/5 unable  Hip external rotation 5/5 3/5  Knee flexion 5/5 3/5 (  pain)  Knee extension 5/5 4/5  Ankle dorsiflexion 4+/5 3/5  Ankle plantarflexion 5/5 4/5  Ankle inversion    Ankle eversion     (Blank rows = not tested)  FUNCTIONAL TESTS:  5 times sit to stand: 25.4 seconds .  9/21:  14.06 seconds (marked improvement).    GAIT: Distance walked: in clinic Assistive device utilized: Quad cane small base Level of assistance: Modified independence Comments: R sided Trendelenburg.  L antalgic gait pattern with decrease stance on L with short R LE step length/ shuffling.  No arm swing. Walks with wide base of support, with mild supination of B feet. Pt had limited endurance, and significantly limited L hip flexion and DF to clear her foot during swing phase.     01/25/22: 5xSTS: 15.94 sec./ 14.94 sec.  (Marked improvement).      9/21:  5xSTS: 14.06 seconds (marked improvement).      Treatment:  04/07/22:     There.ex.:       Nustep L5 10 min. B UE/LE at seat position 9 (consistent cadence)-  0.50 miles.     Standing cone taps outside of //-bars: B UE assist required for safety.      Walking in //-bars with no UE assist (forward/ backwards/lateral)- 5 laps each.  Working on improving wt. Bearing/ R LE step length with no UE assist.  No ankle wts.        TRX squats: 20x with controlled squat/ chair.   Walking in PT clinic/ hallway with consistent recip. Gait pattern without use of SPC.  No cuing today to correct upright posture/ head position.  No LOB.     Walking outside around side/back of building with use of SPC on sidewalk/ curb/ ramp.  2 episodes of R foot shuffling/ lack of heel strike. No increase pain.      PATIENT EDUCATION:  Education details: Access Code: [email or web address] Person educated: Patient Education method: Explanation, Demonstration, and  Handouts Education comprehension: verbalized understanding and returned demonstration   HOME EXERCISE PROGRAM: Access Code: [email or web address] URL: [email or web address] Date: 11/25/2021 Prepared by: Dorcas Carrow  Exercises - Straight Leg Raise  - 1 x daily - 7 x weekly - 2 sets - 10 reps - Supine Heel Slide  - 1 x daily - 7 x weekly - 2 sets - 10 reps - Supine Hip Adduction Isometric with Ball  - 1 x daily - 7 x weekly - 2 sets - 10 reps - Hooklying Gluteal Sets  - 1 x daily - 7 x weekly - 2 sets - 10 reps - Seated Heel Raise  - 1 x daily - 7 x weekly - 2 sets - 10 reps - Seated Heel Toe Raises  - 1 x daily - 7 x weekly - 2 sets - 10 reps  ASSESSMENT:  CLINICAL IMPRESSION: Pt. Improving walking endurance with use of SPC and consistent reciprocal gait pattern with indoor/ outdoor surfaces.  Pt. Able to ambulate short distances without SPC but moderate increase in antalgic gait pattern.  Pt. Will return to [facility] 1-2x prior to next PT tx. Session.  Pt. Benefits from //-bars or handrails with at least 1 UE assist.  Pt will benefit from further skilled PT in order to strengthen her LE's, and to progress her gait mechanics.   OBJECTIVE IMPAIRMENTS Abnormal gait, decreased activity tolerance, decreased balance, decreased endurance, decreased mobility, difficulty walking, decreased ROM, decreased strength, hypomobility, impaired flexibility, impaired sensation, improper body mechanics, postural dysfunction, obesity, and pain.   ACTIVITY  LIMITATIONS carrying, lifting, bending, sitting, standing, squatting, sleeping, stairs, transfers, bed mobility, dressing, and locomotion level  PARTICIPATION LIMITATIONS: cleaning, laundry, driving, shopping, community activity, occupation, and yard work  PERSONAL FACTORS Age, Education, and Past/current experiences are also affecting patient's functional outcome.   REHAB POTENTIAL: Good  CLINICAL DECISION MAKING: Evolving/moderate  complexity  EVALUATION COMPLEXITY: Moderate   GOALS: Goals reviewed with patient? Yes  SHORT TERM GOALS: Target date: 03/24/22  Pt will ambulate through the clinic with a symmetrical gait pattern, with equal step length and decreased pain for > 100 ft.  Baseline: Asymmetrical gait - increased L step length and decreased R step length Goal status: Partially met   LONG TERM GOALS: Target date: 04/21/22  Pt will increase FOTO score to > 58 Baseline: 9/7: 52 (no significant changes) Goal status: Not met  2.  Pt will decrease pain level to < 2/10 during all ADLs, in order to efficiently accomplish activities with decreased pain and increased independence Baseline:  Goal status: Partially met  3.  Pt will increase Active L knee flexion ROM to > 120 degrees  Baseline: 99, 106 passive.  8/8: 118 deg.   Goal status: Partially met    PLAN: PT FREQUENCY: 2x/week  PT DURATION: 8 weeks  PLANNED INTERVENTIONS: Therapeutic exercises, Therapeutic activity, Neuromuscular re-education, Balance training, Gait training, Patient/Family education, Joint mobilization, Stair training, Aquatic Therapy, Cryotherapy, Moist heat, and Manual therapy.  PLAN FOR NEXT SESSION: Strengthening L LE and Hip flexion / hip abduction and stretch L iliopsoas. Gait training. Endurance emphasis.      Pura Spice, PT, DPT # [id number] 04/07/2022, 4:42 PM [place] [facility] [address] [place], [place], [address] Phone: [phone]   Fax:  [phone]

## 2022-04-13 ENCOUNTER — Encounter: Payer: Self-pay | Admitting: Physical Therapy

## 2022-04-13 ENCOUNTER — Ambulatory Visit: Payer: BC Managed Care – PPO | Admitting: Physical Therapy

## 2022-04-13 DIAGNOSIS — M5459 Other low back pain: Secondary | ICD-10-CM

## 2022-04-13 DIAGNOSIS — R2689 Other abnormalities of gait and mobility: Secondary | ICD-10-CM

## 2022-04-13 DIAGNOSIS — M25652 Stiffness of left hip, not elsewhere classified: Secondary | ICD-10-CM | POA: Diagnosis not present

## 2022-04-13 DIAGNOSIS — M6281 Muscle weakness (generalized): Secondary | ICD-10-CM

## 2022-04-13 DIAGNOSIS — R269 Unspecified abnormalities of gait and mobility: Secondary | ICD-10-CM

## 2022-04-13 DIAGNOSIS — M25552 Pain in left hip: Secondary | ICD-10-CM

## 2022-04-13 NOTE — Therapy (Addendum)
OUTPATIENT PHYSICAL THERAPY THORACOLUMBAR TREATMENT Physical Therapy Progress Note   Dates of reporting period  03/04/22  to 04/13/22    Patient Name: Yesenia Stevens MRN: [id number] DOB:01-Dec-1965, 56 y.o., female Today's Date: 04/13/2022   PT End of Session -     Visit Number 30   Number of Visits  35   Date for PT Re-Evaluation 04/21/22    PT Start Time 0946 to 1034 (48 minutes)    Equipment Utilized During Treatment No gait belt   Activity Tolerance Patient limited by pain;Patient limited by fatigue    Behavior During Therapy [facility] for tasks assessed/performed          Past Medical History:  Diagnosis Date   Diabetes mellitus without complication ([place])    Hypertension    Past Surgical History:  Procedure Laterality Date   CESAREAN SECTION     CHOLECYSTECTOMY     COLONOSCOPY WITH PROPOFOL N/A 12/10/2019   Procedure: COLONOSCOPY WITH PROPOFOL;  Surgeon: [name], MD;  Location: [facility] ENDOSCOPY;  Service: Gastroenterology;  Laterality: N/A;   COLONOSCOPY WITH PROPOFOL N/A 12/24/2019   Procedure: COLONOSCOPY WITH PROPOFOL;  Surgeon: [name], MD;  Location: [facility] ENDOSCOPY;  Service: Gastroenterology;  Laterality: N/A;   FRACTURE SURGERY     Patient Active Problem List   Diagnosis Date Noted   Hyperlipidemia associated with type 2 diabetes mellitus ([place]) 09/22/2021   Palpitations 03/19/2020   Diarrhea 03/19/2020   Premature atrial contractions 08/20/2018   Morbid (severe) obesity due to excess calories ([place]) 08/01/2018   Shortness of breath 07/26/2018   Chronic cough 01/77/9390   Uncomplicated type 2 diabetes mellitus ([place]) 03/22/2018   Hypertension 03/22/2018    PCP: Jon Billings, NP   REFERRING PROVIDER: Katherine Mantle, MD  REFERRING DIAG:  Diagnosis  S32.15XD (ICD-10-CM) - Type 2 fracture of sacrum, subsequent encounter for fracture with routine healing  S32.422D (ICD-10-CM) - Displaced fracture of posterior wall of left acetabulum, subsequent  encounter for fracture with routine healing    Rationale for Evaluation and Treatment Rehabilitation  THERAPY DIAG:  Stiffness of left hip joint  Other low back pain  Gait difficulty  Pain in left hip  Balance problems  Muscle weakness (generalized)  ONSET DATE: 05/28/21  SUBJECTIVE:                                                                                                                                                                                           SUBJECTIVE STATEMENT: 04/13/22:  Pt. Did not get back to [facility] since last PT appt.  Pt. Sore in L anterior hip after last PT tx. Session.  Pt. Reports 3/10 L hip discomfort prior to tx. Session.    PERTINENT HISTORY:  Hx of L hip dislocation, and screws placed in lower back after her injury. Pt left hospital in a walker, and utilized a WC originally during [facility]. Pt has decreased ankle DF in L ankle, and pain in her lateral foot / 5th metatarsal. Pt had L knee scope, and has tenderness/bruising in L LE along tibia.  PAIN:  Are you having pain? NPRS scale: 3/10 Pain location: L hip (anterior) Pain description: popping and dull/achy Aggravating factors: standing, bending forward (especially left) Relieving factors: sitting down, tylenol PRN   PRECAUTIONS: Anterior hip, Posterior hip, Knee, and Fall  WEIGHT BEARING RESTRICTIONS no  FALLS:  Has patient fallen in last 6 months? Yes. Number of falls 1  LIVING ENVIRONMENT: Lives with: lives with their family Lives in: House/apartment Stairs: yes - 2 to get into home with raining Has following equipment at home: Quad cane large base and Wheelchair (manual)  OCCUPATION:  Pt is currently on long term disability. Previously worked at [employer] prior to injury. Would like to get back to work if the company can find a position that is more stationary.  PLOF: Independent  PATIENT GOALS Pt wants to alleviate pain, become more mobile, strengthen back musculature,  strengthen LEs   OBJECTIVE:   DIAGNOSTIC FINDINGS:  LUMBAR SPINE - 2-3 VIEW   COMPARISON:  MRI 09/28/2009   FINDINGS: 4-5 mm retrolisthesis L5-S1, slightly progressive since prior examination, possibly degenerative in nature. Otherwise normal lumbar lordosis. No acute fracture of the lumbar spine. Vertebral body height is preserved. Intervertebral disc space narrowing and endplate remodeling at T9-7 and L5-S1 is in keeping with mild degenerative disc disease at these levels. Paraspinal soft tissues are unremarkable. Right sacroiliac arthrodesis has been performed with 2 partially threaded screws.   IMPRESSION: No acute fracture or traumatic listhesis.   Degenerative changes L4-S1 with 5 mm retrolisthesis L5-S1.     Electronically Signed   By: Fidela Salisbury M.D.   On: 09/17/2021 22:13  PATIENT SURVEYS:  FOTO 52 ; 61  SCREENING FOR RED FLAGS: Bowel or bladder incontinence: No Spinal tumors: No Cauda equina syndrome: No Compression fracture: No Abdominal aneurysm: No  COGNITION:  Overall cognitive status: Within functional limits for tasks assessed     SENSATION: Not tested - patient reports altered sensation in L LE/foot  POSTURE: rounded shoulders, forward head, and weight shift left  PALPATION: Moderate L medial lower leg tenderness with palpation  LUMBAR ROM:   Active  AROM  eval  Flexion 50% limited (compensation to R)  Extension 75% limited (pain)  Right lateral flexion   Left lateral flexion   Right rotation 50% limited  Left rotation 25% limited   (Blank rows = not tested)  LOWER EXTREMITY ROM:     PROM/AROM Right eval Left eval  Hip flexion WFL 119/ 90 deg.  Hip extension    Hip abduction [facility]  Hip adduction    Hip internal rotation    Hip external rotation    Knee flexion WFL 106/ 99 deg.  Knee extension    Ankle dorsiflexion WFL Limited by pain  Ankle plantarflexion WFL Limited by pain  Ankle inversion    Ankle eversion      (Blank rows = not tested)  LOWER EXTREMITY MMT:    MMT Right eval Left eval  Hip flexion 4/5 3/5  Hip extension    Hip abduction 4/5 3/5  Hip adduction  4/5 4/5  Hip internal rotation 5/5 unable  Hip external rotation 5/5 3/5  Knee flexion 5/5 3/5 (pain)  Knee extension 5/5 4/5  Ankle dorsiflexion 4+/5 3/5  Ankle plantarflexion 5/5 4/5  Ankle inversion    Ankle eversion     (Blank rows = not tested)  FUNCTIONAL TESTS:  5 times sit to stand: 25.4 seconds .  9/21:  14.06 seconds (marked improvement).    GAIT: Distance walked: in clinic Assistive device utilized: Quad cane small base Level of assistance: Modified independence Comments: R sided Trendelenburg.  L antalgic gait pattern with decrease stance on L with short R LE step length/ shuffling.  No arm swing. Walks with wide base of support, with mild supination of B feet. Pt had limited endurance, and significantly limited L hip flexion and DF to clear her foot during swing phase.     01/25/22: 5xSTS: 15.94 sec./ 14.94 sec.  (Marked improvement).      9/21:  5xSTS: 14.06 seconds (marked improvement).      Treatment:  04/13/22:     There.ex.:       Nustep L5 10 min. B UE/LE at seat position 9 (consistent cadence)-  0.50 miles.     Standing functional reaching (cones) outside of //-bars.  Standing alt. Cone taps L/R with light to no UE assist.  Mirror feedback for posture correction.      Walking in //-bars with no UE assist (forward/ backwards/lateral)- 5 laps each.  Working on improving wt. Bearing/ R LE step length with no UE assist.  No ankle wts.        TRX squats: 20x with controlled squat/ chair.    Agility ladder: recip. Gait with use of markers on agility ladder to improve consistency.  2 laps.      Supine L heel slides (118 deg.)- knee flexion.     Walking in PT clinic/ hallway with consistent recip. Gait pattern without use of SPC.  No cuing today to correct upright posture/ head position.  No LOB.      Walking outside around side/back of building with use of SPC on sidewalk/ curb/ ramp.     PATIENT EDUCATION:  Education details: Access Code: [email or web address] Person educated: Patient Education method: Explanation, Demonstration, and Handouts Education comprehension: verbalized understanding and returned demonstration   HOME EXERCISE PROGRAM: Access Code: [email or web address] URL: [email or web address] Date: 11/25/2021 Prepared by: Dorcas Carrow  Exercises - Straight Leg Raise  - 1 x daily - 7 x weekly - 2 sets - 10 reps - Supine Heel Slide  - 1 x daily - 7 x weekly - 2 sets - 10 reps - Supine Hip Adduction Isometric with Ball  - 1 x daily - 7 x weekly - 2 sets - 10 reps - Hooklying Gluteal Sets  - 1 x daily - 7 x weekly - 2 sets - 10 reps - Seated Heel Raise  - 1 x daily - 7 x weekly - 2 sets - 10 reps - Seated Heel Toe Raises  - 1 x daily - 7 x weekly - 2 sets - 10 reps  ASSESSMENT:  CLINICAL IMPRESSION: Pt. Improving walking endurance with use of SPC and consistent reciprocal gait pattern with indoor/ outdoor surfaces.  Pt. Able to ambulate short distances without SPC but moderate increase in antalgic gait pattern.  Pt. Instructed to attend [employer] 2x prior to next PT tx. Session.  No increase in L hip pain during tx. But fatigue noted.  Pt. Benefits from //-bars or  handrails with at least 1 UE assist.  Pt will benefit from further skilled PT in order to strengthen her LE's, and to progress her gait mechanics.   OBJECTIVE IMPAIRMENTS Abnormal gait, decreased activity tolerance, decreased balance, decreased endurance, decreased mobility, difficulty walking, decreased ROM, decreased strength, hypomobility, impaired flexibility, impaired sensation, improper body mechanics, postural dysfunction, obesity, and pain.   ACTIVITY LIMITATIONS carrying, lifting, bending, sitting, standing, squatting, sleeping, stairs, transfers, bed mobility, dressing, and locomotion level  PARTICIPATION  LIMITATIONS: cleaning, laundry, driving, shopping, community activity, occupation, and yard work  PERSONAL FACTORS Age, Education, and Past/current experiences are also affecting patient's functional outcome.   REHAB POTENTIAL: Good  CLINICAL DECISION MAKING: Evolving/moderate complexity  EVALUATION COMPLEXITY: Moderate   GOALS: Goals reviewed with patient? Yes  SHORT TERM GOALS: Target date: 03/24/22  Pt will ambulate through the clinic with a symmetrical gait pattern, with equal step length and decreased pain for > 100 ft.  Baseline: Asymmetrical gait - increased L step length and decreased R step length Goal status: Partially met   LONG TERM GOALS: Target date: 04/21/22  Pt will increase FOTO score to > 58 Baseline: 9/7: 52 (no significant changes) Goal status: Not met  2.  Pt will decrease pain level to < 2/10 during all ADLs, in order to efficiently accomplish activities with decreased pain and increased independence Baseline:  Goal status: Partially met  3.  Pt will increase Active L knee flexion ROM to > 120 degrees  Baseline: 99, 106 passive.  8/8: 118 deg.  10/25: 118 deg.  Goal status: Partially met    PLAN: PT FREQUENCY: 2x/week  PT DURATION: 8 weeks  PLANNED INTERVENTIONS: Therapeutic exercises, Therapeutic activity, Neuromuscular re-education, Balance training, Gait training, Patient/Family education, Joint mobilization, Stair training, Aquatic Therapy, Cryotherapy, Moist heat, and Manual therapy.  PLAN FOR NEXT SESSION: Strengthening L LE and Hip flexion / hip abduction and stretch L iliopsoas. Gait training. Endurance emphasis.   Pt. Has 1 more PT tx. Session.  DISCUSS POC   Pura Spice, PT, DPT # [id number] 04/13/2022, 9:48 AM [place] [facility] [address] [place], [place], [address] Phone: [phone]   Fax:  [phone]

## 2022-04-15 ENCOUNTER — Encounter: Payer: Self-pay | Admitting: Physical Therapy

## 2022-04-15 ENCOUNTER — Ambulatory Visit: Payer: BC Managed Care – PPO | Admitting: Physical Therapy

## 2022-04-15 DIAGNOSIS — M25652 Stiffness of left hip, not elsewhere classified: Secondary | ICD-10-CM | POA: Diagnosis not present

## 2022-04-15 DIAGNOSIS — R269 Unspecified abnormalities of gait and mobility: Secondary | ICD-10-CM

## 2022-04-15 DIAGNOSIS — M25552 Pain in left hip: Secondary | ICD-10-CM

## 2022-04-15 DIAGNOSIS — M5459 Other low back pain: Secondary | ICD-10-CM

## 2022-04-15 DIAGNOSIS — R2689 Other abnormalities of gait and mobility: Secondary | ICD-10-CM

## 2022-04-15 DIAGNOSIS — M6281 Muscle weakness (generalized): Secondary | ICD-10-CM

## 2022-04-15 NOTE — Therapy (Signed)
OUTPATIENT PHYSICAL THERAPY THORACOLUMBAR TREATMENT  Patient Name: Yesenia Stevens MRN: [id number] DOB:01-Mar-1966, 56 y.o., female Today's Date: 04/15/2022   PT End of Session -     Visit Number 31   Number of Visits  35   Date for PT Re-Evaluation 04/21/22    PT Start Time 0945 to 1029  (44 minutes)    Equipment Utilized During Treatment No gait belt   Activity Tolerance Patient limited by pain;Patient limited by fatigue    Behavior During Therapy [facility] for tasks assessed/performed          Past Medical History:  Diagnosis Date   Diabetes mellitus without complication ([place])    Hypertension    Past Surgical History:  Procedure Laterality Date   CESAREAN SECTION     CHOLECYSTECTOMY     COLONOSCOPY WITH PROPOFOL N/A 12/10/2019   Procedure: COLONOSCOPY WITH PROPOFOL;  Surgeon: [name], MD;  Location: [facility] ENDOSCOPY;  Service: Gastroenterology;  Laterality: N/A;   COLONOSCOPY WITH PROPOFOL N/A 12/24/2019   Procedure: COLONOSCOPY WITH PROPOFOL;  Surgeon: [name], MD;  Location: [facility] ENDOSCOPY;  Service: Gastroenterology;  Laterality: N/A;   FRACTURE SURGERY     Patient Active Problem List   Diagnosis Date Noted   Hyperlipidemia associated with type 2 diabetes mellitus ([place]) 09/22/2021   Palpitations 03/19/2020   Diarrhea 03/19/2020   Premature atrial contractions 08/20/2018   Morbid (severe) obesity due to excess calories ([place]) 08/01/2018   Shortness of breath 07/26/2018   Chronic cough 90/30/0923   Uncomplicated type 2 diabetes mellitus ([place]) 03/22/2018   Hypertension 03/22/2018    PCP: Jon Billings, NP   REFERRING PROVIDER: Katherine Mantle, MD  REFERRING DIAG:  Diagnosis  S32.15XD (ICD-10-CM) - Type 2 fracture of sacrum, subsequent encounter for fracture with routine healing  S32.422D (ICD-10-CM) - Displaced fracture of posterior wall of left acetabulum, subsequent encounter for fracture with routine healing    Rationale for Evaluation and  Treatment Rehabilitation  THERAPY DIAG:  Stiffness of left hip joint  Other low back pain  Gait difficulty  Pain in left hip  Balance problems  Muscle weakness (generalized)  ONSET DATE: 05/28/21  SUBJECTIVE:                                                                                                                                                                                           SUBJECTIVE STATEMENT: 04/15/22:  Pt. Went to [employer] yesterday and completed the bike.    PERTINENT HISTORY:  Hx of L hip dislocation, and screws placed in lower back after her injury. Pt left hospital in a walker, and utilized a WC originally  during [facility]. Pt has decreased ankle DF in L ankle, and pain in her lateral foot / 5th metatarsal. Pt had L knee scope, and has tenderness/bruising in L LE along tibia.  PAIN:  Are you having pain? NPRS scale: 3/10 Pain location: L hip (anterior) Pain description: popping and dull/achy Aggravating factors: standing, bending forward (especially left) Relieving factors: sitting down, tylenol PRN   PRECAUTIONS: Anterior hip, Posterior hip, Knee, and Fall  WEIGHT BEARING RESTRICTIONS no  FALLS:  Has patient fallen in last 6 months? Yes. Number of falls 1  LIVING ENVIRONMENT: Lives with: lives with their family Lives in: House/apartment Stairs: yes - 2 to get into home with raining Has following equipment at home: Quad cane large base and Wheelchair (manual)  OCCUPATION:  Pt is currently on long term disability. Previously worked at [employer] prior to injury. Would like to get back to work if the company can find a position that is more stationary.  PLOF: Independent  PATIENT GOALS Pt wants to alleviate pain, become more mobile, strengthen back musculature, strengthen LEs   OBJECTIVE:   DIAGNOSTIC FINDINGS:  LUMBAR SPINE - 2-3 VIEW   COMPARISON:  MRI 09/28/2009   FINDINGS: 4-5 mm retrolisthesis L5-S1, slightly progressive since  prior examination, possibly degenerative in nature. Otherwise normal lumbar lordosis. No acute fracture of the lumbar spine. Vertebral body height is preserved. Intervertebral disc space narrowing and endplate remodeling at Q2-5 and L5-S1 is in keeping with mild degenerative disc disease at these levels. Paraspinal soft tissues are unremarkable. Right sacroiliac arthrodesis has been performed with 2 partially threaded screws.   IMPRESSION: No acute fracture or traumatic listhesis.   Degenerative changes L4-S1 with 5 mm retrolisthesis L5-S1.     Electronically Signed   By: Fidela Salisbury M.D.   On: 09/17/2021 22:13  PATIENT SURVEYS:  FOTO 52 ; 61  SCREENING FOR RED FLAGS: Bowel or bladder incontinence: No Spinal tumors: No Cauda equina syndrome: No Compression fracture: No Abdominal aneurysm: No  COGNITION:  Overall cognitive status: Within functional limits for tasks assessed     SENSATION: Not tested - patient reports altered sensation in L LE/foot  POSTURE: rounded shoulders, forward head, and weight shift left  PALPATION: Moderate L medial lower leg tenderness with palpation  LUMBAR ROM:   Active  AROM  eval  Flexion 50% limited (compensation to R)  Extension 75% limited (pain)  Right lateral flexion   Left lateral flexion   Right rotation 50% limited  Left rotation 25% limited   (Blank rows = not tested)  LOWER EXTREMITY ROM:     PROM/AROM Right eval Left eval  Hip flexion WFL 119/ 90 deg.  Hip extension    Hip abduction [facility]  Hip adduction    Hip internal rotation    Hip external rotation    Knee flexion WFL 106/ 99 deg.  Knee extension    Ankle dorsiflexion WFL Limited by pain  Ankle plantarflexion WFL Limited by pain  Ankle inversion    Ankle eversion     (Blank rows = not tested)  LOWER EXTREMITY MMT:    MMT Right eval Left eval  Hip flexion 4/5 3/5  Hip extension    Hip abduction 4/5 3/5  Hip adduction 4/5 4/5  Hip internal  rotation 5/5 unable  Hip external rotation 5/5 3/5  Knee flexion 5/5 3/5 (pain)  Knee extension 5/5 4/5  Ankle dorsiflexion 4+/5 3/5  Ankle plantarflexion 5/5 4/5  Ankle inversion  Ankle eversion     (Blank rows = not tested)  FUNCTIONAL TESTS:  5 times sit to stand: 25.4 seconds .  9/21:  14.06 seconds (marked improvement).    GAIT: Distance walked: in clinic Assistive device utilized: Quad cane small base Level of assistance: Modified independence Comments: R sided Trendelenburg.  L antalgic gait pattern with decrease stance on L with short R LE step length/ shuffling.  No arm swing. Walks with wide base of support, with mild supination of B feet. Pt had limited endurance, and significantly limited L hip flexion and DF to clear her foot during swing phase.     01/25/22: 5xSTS: 15.94 sec./ 14.94 sec.  (Marked improvement).      9/21:  5xSTS: 14.06 seconds (marked improvement).      Treatment:  04/15/22:     There.ex.:       Nustep L5 10 min. B UE/LE at seat position 9 (consistent cadence)-  0.51 miles.     Walking in //-bars with no UE assist (forward/ backwards/lateral)- 5 laps each.  Working on improving wt. Bearing/ R LE step length with no UE assist.  No ankle wts. Pharmacist, hospital.  Slight increase in R foot pain with lateral walking.        Agility ladder: recip. Gait with use of markers on agility ladder to improve consistency.  3 laps.  Cuing to decrease R LE step cadence and correct L lateral leaning.        TRX squats: 25x with controlled squat/ chair.    Standing marching at //-bars with mirror feedback 20x.      Walking in PT clinic/ hallway with consistent recip. Gait pattern without use of SPC.  No cuing today to correct upright posture/ head position.  No LOB.     Walking outside around side/back of building with use of SPC on sidewalk/ curb/ ramp.     PATIENT EDUCATION:  Education details: Access Code: [email or web address] Person educated: Patient Education  method: Explanation, Demonstration, and Handouts Education comprehension: verbalized understanding and returned demonstration   HOME EXERCISE PROGRAM: Access Code: [email or web address] URL: [email or web address] Date: 11/25/2021 Prepared by: Dorcas Carrow  Exercises - Straight Leg Raise  - 1 x daily - 7 x weekly - 2 sets - 10 reps - Supine Heel Slide  - 1 x daily - 7 x weekly - 2 sets - 10 reps - Supine Hip Adduction Isometric with Ball  - 1 x daily - 7 x weekly - 2 sets - 10 reps - Hooklying Gluteal Sets  - 1 x daily - 7 x weekly - 2 sets - 10 reps - Seated Heel Raise  - 1 x daily - 7 x weekly - 2 sets - 10 reps - Seated Heel Toe Raises  - 1 x daily - 7 x weekly - 2 sets - 10 reps  ASSESSMENT:  CLINICAL IMPRESSION: Pt. Improving walking endurance with use of SPC and consistent reciprocal gait pattern with indoor/ outdoor surfaces.  Pt. Able to ambulate short distances without SPC but moderate increase in antalgic gait pattern.  Pt. Instructed to attend [employer] 2x prior to next PT tx. Session.  No increase in L hip pain during tx. But fatigue noted.  Pt. Benefits from //-bars or handrails with at least 1 UE assist.  Pt will benefit from further skilled PT in order to strengthen her LE's, and to progress her gait mechanics.   OBJECTIVE IMPAIRMENTS Abnormal gait, decreased activity tolerance, decreased balance, decreased endurance, decreased mobility, difficulty  walking, decreased ROM, decreased strength, hypomobility, impaired flexibility, impaired sensation, improper body mechanics, postural dysfunction, obesity, and pain.   ACTIVITY LIMITATIONS carrying, lifting, bending, sitting, standing, squatting, sleeping, stairs, transfers, bed mobility, dressing, and locomotion level  PARTICIPATION LIMITATIONS: cleaning, laundry, driving, shopping, community activity, occupation, and yard work  PERSONAL FACTORS Age, Education, and Past/current experiences are also affecting patient's functional  outcome.   REHAB POTENTIAL: Good  CLINICAL DECISION MAKING: Evolving/moderate complexity  EVALUATION COMPLEXITY: Moderate   GOALS: Goals reviewed with patient? Yes  SHORT TERM GOALS: Target date: 03/24/22  Pt will ambulate through the clinic with a symmetrical gait pattern, with equal step length and decreased pain for > 100 ft.  Baseline: Asymmetrical gait - increased L step length and decreased R step length Goal status: Partially met   LONG TERM GOALS: Target date: 04/21/22  Pt will increase FOTO score to > 58 Baseline: 9/7: 52 (no significant changes) Goal status: Not met  2.  Pt will decrease pain level to < 2/10 during all ADLs, in order to efficiently accomplish activities with decreased pain and increased independence Baseline:  Goal status: Partially met  3.  Pt will increase Active L knee flexion ROM to > 120 degrees  Baseline: 99, 106 passive.  8/8: 118 deg.  10/25: 118 deg.  Goal status: Partially met    PLAN: PT FREQUENCY: 2x/week  PT DURATION: 8 weeks  PLANNED INTERVENTIONS: Therapeutic exercises, Therapeutic activity, Neuromuscular re-education, Balance training, Gait training, Patient/Family education, Joint mobilization, Stair training, Aquatic Therapy, Cryotherapy, Moist heat, and Manual therapy.  PLAN FOR NEXT SESSION: Strengthening L LE and Hip flexion / hip abduction and stretch L iliopsoas. Gait training. Endurance emphasis.   Pt. Has 1 more PT tx. Session.  DISCUSS POC   Pura Spice, PT, DPT # [phone] 04/15/2022, 11:05 AM [facility] [address] [place], [place], [address] Phone: [phone]   Fax:  [phone]

## 2022-04-18 ENCOUNTER — Ambulatory Visit: Payer: BC Managed Care – PPO | Admitting: Physical Therapy

## 2022-04-18 ENCOUNTER — Encounter: Payer: Self-pay | Admitting: Physical Therapy

## 2022-04-18 DIAGNOSIS — R2689 Other abnormalities of gait and mobility: Secondary | ICD-10-CM

## 2022-04-18 DIAGNOSIS — M6281 Muscle weakness (generalized): Secondary | ICD-10-CM

## 2022-04-18 DIAGNOSIS — R269 Unspecified abnormalities of gait and mobility: Secondary | ICD-10-CM

## 2022-04-18 DIAGNOSIS — M25652 Stiffness of left hip, not elsewhere classified: Secondary | ICD-10-CM

## 2022-04-18 DIAGNOSIS — M25552 Pain in left hip: Secondary | ICD-10-CM

## 2022-04-18 DIAGNOSIS — M5459 Other low back pain: Secondary | ICD-10-CM

## 2022-04-18 NOTE — Therapy (Signed)
OUTPATIENT PHYSICAL THERAPY THORACOLUMBAR TREATMENT  Patient Name: Yesenia Stevens MRN: [id number] DOB:September 28, 1965, 56 y.o., female Today's Date: 04/18/2022   PT End of Session -     Visit Number 32   Number of Visits  35   Date for PT Re-Evaluation 04/21/22    PT Start Time 0947 to 1034 (47 minutes)    Equipment Utilized During Treatment No gait belt   Activity Tolerance Patient limited by pain;Patient limited by fatigue    Behavior During Therapy [facility] for tasks assessed/performed          Past Medical History:  Diagnosis Date   Diabetes mellitus without complication ([place])    Hypertension    Past Surgical History:  Procedure Laterality Date   CESAREAN SECTION     CHOLECYSTECTOMY     COLONOSCOPY WITH PROPOFOL N/A 12/10/2019   Procedure: COLONOSCOPY WITH PROPOFOL;  Surgeon: [name], MD;  Location: [facility] ENDOSCOPY;  Service: Gastroenterology;  Laterality: N/A;   COLONOSCOPY WITH PROPOFOL N/A 12/24/2019   Procedure: COLONOSCOPY WITH PROPOFOL;  Surgeon: [name], MD;  Location: [facility] ENDOSCOPY;  Service: Gastroenterology;  Laterality: N/A;   FRACTURE SURGERY     Patient Active Problem List   Diagnosis Date Noted   Hyperlipidemia associated with type 2 diabetes mellitus ([place]) 09/22/2021   Palpitations 03/19/2020   Diarrhea 03/19/2020   Premature atrial contractions 08/20/2018   Morbid (severe) obesity due to excess calories ([place]) 08/01/2018   Shortness of breath 07/26/2018   Chronic cough 23/55/7322   Uncomplicated type 2 diabetes mellitus ([place]) 03/22/2018   Hypertension 03/22/2018    PCP: Jon Billings, NP   REFERRING PROVIDER: Katherine Mantle, MD  REFERRING DIAG:  Diagnosis  S32.15XD (ICD-10-CM) - Type 2 fracture of sacrum, subsequent encounter for fracture with routine healing  S32.422D (ICD-10-CM) - Displaced fracture of posterior wall of left acetabulum, subsequent encounter for fracture with routine healing    Rationale for Evaluation and  Treatment Rehabilitation  THERAPY DIAG:  Stiffness of left hip joint  Other low back pain  Gait difficulty  Pain in left hip  Balance problems  Muscle weakness (generalized)  ONSET DATE: 05/28/21  SUBJECTIVE:                                                                                                                                                                                           SUBJECTIVE STATEMENT: 04/18/22:  Pt. Had a busy weekend with a drive to [place], [place] for basketball tournament.  Pt. States her L anterior hip has been hurting with standing/walking tasks.  Pt. Brought in aquatic ex. Schedule for [employer] and PT assisted with picking  out appropriate classes.    PERTINENT HISTORY:  Hx of L hip dislocation, and screws placed in lower back after her injury. Pt left hospital in a walker, and utilized a WC originally during [facility] Pt has decreased ankle DF in L ankle, and pain in her lateral foot / 5th metatarsal. Pt had L knee scope, and has tenderness/bruising in L LE along tibia.  PAIN:  Are you having pain? NPRS scale: 5/10 Pain location: L hip (anterior) Pain description: popping and dull/achy Aggravating factors: standing, bending forward (especially left) Relieving factors: sitting down, tylenol PRN   PRECAUTIONS: Anterior hip, Posterior hip, Knee, and Fall  WEIGHT BEARING RESTRICTIONS no  FALLS:  Has patient fallen in last 6 months? Yes. Number of falls 1  LIVING ENVIRONMENT: Lives with: lives with their family Lives in: House/apartment Stairs: yes - 2 to get into home with raining Has following equipment at home: Quad cane large base and Wheelchair (manual)  OCCUPATION:  Pt is currently on long term disability. Previously worked at [employer] prior to injury. Would like to get back to work if the company can find a position that is more stationary.  PLOF: Independent  PATIENT GOALS Pt wants to alleviate pain, become more mobile, strengthen back  musculature, strengthen LEs   OBJECTIVE:   DIAGNOSTIC FINDINGS:  LUMBAR SPINE - 2-3 VIEW   COMPARISON:  MRI 09/28/2009   FINDINGS: 4-5 mm retrolisthesis L5-S1, slightly progressive since prior examination, possibly degenerative in nature. Otherwise normal lumbar lordosis. No acute fracture of the lumbar spine. Vertebral body height is preserved. Intervertebral disc space narrowing and endplate remodeling at F7-4 and L5-S1 is in keeping with mild degenerative disc disease at these levels. Paraspinal soft tissues are unremarkable. Right sacroiliac arthrodesis has been performed with 2 partially threaded screws.   IMPRESSION: No acute fracture or traumatic listhesis.   Degenerative changes L4-S1 with 5 mm retrolisthesis L5-S1.     Electronically Signed   By: Fidela Salisbury M.D.   On: 09/17/2021 22:13  PATIENT SURVEYS:  FOTO 52 ; 61  SCREENING FOR RED FLAGS: Bowel or bladder incontinence: No Spinal tumors: No Cauda equina syndrome: No Compression fracture: No Abdominal aneurysm: No  COGNITION:  Overall cognitive status: Within functional limits for tasks assessed     SENSATION: Not tested - patient reports altered sensation in L LE/foot  POSTURE: rounded shoulders, forward head, and weight shift left  PALPATION: Moderate L medial lower leg tenderness with palpation  LUMBAR ROM:   Active  AROM  eval  Flexion 50% limited (compensation to R)  Extension 75% limited (pain)  Right lateral flexion   Left lateral flexion   Right rotation 50% limited  Left rotation 25% limited   (Blank rows = not tested)  LOWER EXTREMITY ROM:     PROM/AROM Right eval Left eval  Hip flexion WFL 119/ 90 deg.  Hip extension    Hip abduction [facility]  Hip adduction    Hip internal rotation    Hip external rotation    Knee flexion WFL 106/ 99 deg.  Knee extension    Ankle dorsiflexion WFL Limited by pain  Ankle plantarflexion WFL Limited by pain  Ankle inversion    Ankle  eversion     (Blank rows = not tested)  LOWER EXTREMITY MMT:    MMT Right eval Left eval  Hip flexion 4/5 3/5  Hip extension    Hip abduction 4/5 3/5  Hip adduction 4/5 4/5  Hip internal rotation 5/5  unable  Hip external rotation 5/5 3/5  Knee flexion 5/5 3/5 (pain)  Knee extension 5/5 4/5  Ankle dorsiflexion 4+/5 3/5  Ankle plantarflexion 5/5 4/5  Ankle inversion    Ankle eversion     (Blank rows = not tested)  FUNCTIONAL TESTS:  5 times sit to stand: 25.4 seconds .  9/21:  14.06 seconds (marked improvement).    GAIT: Distance walked: in clinic Assistive device utilized: Quad cane small base Level of assistance: Modified independence Comments: R sided Trendelenburg.  L antalgic gait pattern with decrease stance on L with short R LE step length/ shuffling.  No arm swing. Walks with wide base of support, with mild supination of B feet. Pt had limited endurance, and significantly limited L hip flexion and DF to clear her foot during swing phase.     01/25/22: 5xSTS: 15.94 sec./ 14.94 sec.  (Marked improvement).      9/21:  5xSTS: 14.06 seconds (marked improvement).      Treatment:  04/18/22:     There.ex.:       Nustep L5 7 min. B UE/LE at seat position 9 (fatigued today).    Walking in //-bars with no UE assist (forward/ backwards/lateral)- 5 laps each.  Working on improving wt. Bearing/ R LE step length with no UE assist.  No ankle wts. Pharmacist, hospital.  No c/o R foot pain today.       BOSU lunges on L/R 10x in //-bars with B UE assist.       6" and 12" step touches 10x2 each with B UE assist.      Walking in PT clinic/ hallway with consistent recip. Gait pattern without use of SPC.  No cuing today to correct upright posture/ head position.  No LOB.      Supine L SLR 10x without PT assist.  Supine L hip/knee manual isometrics 5x (moderate resistance)- pt. Pain limited with L hip/LE in midline position and pt. Prefers ER.       Discussed aquatic ex. Classes and  recommended shallow water (low to moderate intensity) classes.     PATIENT EDUCATION:  Education details: Access Code: [email or web address] Person educated: Patient Education method: Explanation, Demonstration, and Handouts Education comprehension: verbalized understanding and returned demonstration   HOME EXERCISE PROGRAM: Access Code: [email or web address] URL: [email or web address] Date: 11/25/2021 Prepared by: Dorcas Carrow  Exercises - Straight Leg Raise  - 1 x daily - 7 x weekly - 2 sets - 10 reps - Supine Heel Slide  - 1 x daily - 7 x weekly - 2 sets - 10 reps - Supine Hip Adduction Isometric with Ball  - 1 x daily - 7 x weekly - 2 sets - 10 reps - Hooklying Gluteal Sets  - 1 x daily - 7 x weekly - 2 sets - 10 reps - Seated Heel Raise  - 1 x daily - 7 x weekly - 2 sets - 10 reps - Seated Heel Toe Raises  - 1 x daily - 7 x weekly - 2 sets - 10 reps  ASSESSMENT:  CLINICAL IMPRESSION: Pt. Improving walking endurance with use of SPC and consistent reciprocal gait pattern with indoor/ outdoor surfaces.  Moderate L hip pain with walking/ standing there.ex. Pt. Able to ambulate short distances without SPC but moderate increase in antalgic gait pattern.    Increase LE/ generalized fatigue noted with Nustep and tx. Today.  Pt. Benefits from //-bars or handrails with at least 1 UE assist.  Pt will benefit from further  skilled PT in order to strengthen her LE's, and to progress her gait mechanics.   OBJECTIVE IMPAIRMENTS Abnormal gait, decreased activity tolerance, decreased balance, decreased endurance, decreased mobility, difficulty walking, decreased ROM, decreased strength, hypomobility, impaired flexibility, impaired sensation, improper body mechanics, postural dysfunction, obesity, and pain.   ACTIVITY LIMITATIONS carrying, lifting, bending, sitting, standing, squatting, sleeping, stairs, transfers, bed mobility, dressing, and locomotion level  PARTICIPATION LIMITATIONS: cleaning, laundry,  driving, shopping, community activity, occupation, and yard work  PERSONAL FACTORS Age, Education, and Past/current experiences are also affecting patient's functional outcome.   REHAB POTENTIAL: Good  CLINICAL DECISION MAKING: Evolving/moderate complexity  EVALUATION COMPLEXITY: Moderate   GOALS: Goals reviewed with patient? Yes  SHORT TERM GOALS: Target date: 03/24/22  Pt will ambulate through the clinic with a symmetrical gait pattern, with equal step length and decreased pain for > 100 ft.  Baseline: Asymmetrical gait - increased L step length and decreased R step length Goal status: Partially met   LONG TERM GOALS: Target date: 04/21/22  Pt will increase FOTO score to > 58 Baseline: 9/7: 52 (no significant changes) Goal status: Not met  2.  Pt will decrease pain level to < 2/10 during all ADLs, in order to efficiently accomplish activities with decreased pain and increased independence Baseline:  Goal status: Partially met  3.  Pt will increase Active L knee flexion ROM to > 120 degrees  Baseline: 99, 106 passive.  8/8: 118 deg.  10/25: 118 deg.  Goal status: Partially met    PLAN: PT FREQUENCY: 2x/week  PT DURATION: 8 weeks  PLANNED INTERVENTIONS: Therapeutic exercises, Therapeutic activity, Neuromuscular re-education, Balance training, Gait training, Patient/Family education, Joint mobilization, Stair training, Aquatic Therapy, Cryotherapy, Moist heat, and Manual therapy.  PLAN FOR NEXT SESSION: Strengthening L LE and Hip flexion / hip abduction and stretch L iliopsoas. Gait training. Endurance emphasis.   RECERT next tx./ 1x/week PT tx.    Pura Spice, PT, DPT # [id number] 04/18/2022, 6:59 PM  [facility] [address] [place], [place], [address] Phone: [phone]   Fax:  [phone]

## 2022-04-27 ENCOUNTER — Ambulatory Visit: Payer: BC Managed Care – PPO | Attending: Trauma Surgery | Admitting: Physical Therapy

## 2022-04-27 ENCOUNTER — Encounter: Payer: Self-pay | Admitting: Physical Therapy

## 2022-04-27 DIAGNOSIS — M25652 Stiffness of left hip, not elsewhere classified: Secondary | ICD-10-CM | POA: Insufficient documentation

## 2022-04-27 DIAGNOSIS — R269 Unspecified abnormalities of gait and mobility: Secondary | ICD-10-CM | POA: Insufficient documentation

## 2022-04-27 DIAGNOSIS — M25552 Pain in left hip: Secondary | ICD-10-CM | POA: Diagnosis present

## 2022-04-27 DIAGNOSIS — M6281 Muscle weakness (generalized): Secondary | ICD-10-CM | POA: Insufficient documentation

## 2022-04-27 DIAGNOSIS — M5459 Other low back pain: Secondary | ICD-10-CM | POA: Insufficient documentation

## 2022-04-27 DIAGNOSIS — R2689 Other abnormalities of gait and mobility: Secondary | ICD-10-CM | POA: Insufficient documentation

## 2022-04-27 NOTE — Therapy (Signed)
OUTPATIENT PHYSICAL THERAPY THORACOLUMBAR TREATMENT/ RECERTIFICATION  Patient Name: Yesenia Stevens MRN: [id number] DOB:1965/12/07, 56 y.o., female Today's Date: 04/27/2022   PT End of Session -     Visit Number 33   Number of Visits  41   Date for PT Re-Evaluation 06/22/22    PT Start Time 0901 to 0950 (49 minutes)    Equipment Utilized During Treatment No gait belt   Activity Tolerance Patient limited by pain;Patient limited by fatigue    Behavior During Therapy [facility] for tasks assessed/performed          Past Medical History:  Diagnosis Date   Diabetes mellitus without complication ([place])    Hypertension    Past Surgical History:  Procedure Laterality Date   CESAREAN SECTION     CHOLECYSTECTOMY     COLONOSCOPY WITH PROPOFOL N/A 12/10/2019   Procedure: COLONOSCOPY WITH PROPOFOL;  Surgeon: [name], MD;  Location: [facility] ENDOSCOPY;  Service: Gastroenterology;  Laterality: N/A;   COLONOSCOPY WITH PROPOFOL N/A 12/24/2019   Procedure: COLONOSCOPY WITH PROPOFOL;  Surgeon: [name], MD;  Location: [facility] ENDOSCOPY;  Service: Gastroenterology;  Laterality: N/A;   FRACTURE SURGERY     Patient Active Problem List   Diagnosis Date Noted   Hyperlipidemia associated with type 2 diabetes mellitus ([place]) 09/22/2021   Palpitations 03/19/2020   Diarrhea 03/19/2020   Premature atrial contractions 08/20/2018   Morbid (severe) obesity due to excess calories ([place]) 08/01/2018   Shortness of breath 07/26/2018   Chronic cough 20/35/5974   Uncomplicated type 2 diabetes mellitus ([place]) 03/22/2018   Hypertension 03/22/2018    PCP: Jon Billings, NP   REFERRING PROVIDER: Katherine Mantle, MD  REFERRING DIAG:  Diagnosis  S32.15XD (ICD-10-CM) - Type 2 fracture of sacrum, subsequent encounter for fracture with routine healing  S32.422D (ICD-10-CM) - Displaced fracture of posterior wall of left acetabulum, subsequent encounter for fracture with routine healing    Rationale for  Evaluation and Treatment Rehabilitation  THERAPY DIAG:  Stiffness of left hip joint  Other low back pain  Gait difficulty  Pain in left hip  Balance problems  Muscle weakness (generalized)  ONSET DATE: 05/28/21  SUBJECTIVE:                                                                                                                                                                                           SUBJECTIVE STATEMENT: 04/27/22:  Pt. Attended aquatic ex. Class on Monday and enjoyed it.  Pt. Planning to attend aquatic class again tomorrow.    PERTINENT HISTORY:  Hx of L hip dislocation, and screws placed in lower back after her injury. Pt  left hospital in a walker, and utilized a WC originally during [facility]. Pt has decreased ankle DF in L ankle, and pain in her lateral foot / 5th metatarsal. Pt had L knee scope, and has tenderness/bruising in L LE along tibia.  PAIN:  Are you having pain? NPRS scale: 5/10 Pain location: L hip (anterior) Pain description: popping and dull/achy Aggravating factors: standing, bending forward (especially left) Relieving factors: sitting down, tylenol PRN   PRECAUTIONS: Anterior hip, Posterior hip, Knee, and Fall  WEIGHT BEARING RESTRICTIONS no  FALLS:  Has patient fallen in last 6 months? Yes. Number of falls 1  LIVING ENVIRONMENT: Lives with: lives with their family Lives in: House/apartment Stairs: yes - 2 to get into home with raining Has following equipment at home: Quad cane large base and Wheelchair (manual)  OCCUPATION:  Pt is currently on long term disability. Previously worked at [employer] prior to injury. Would like to get back to work if the company can find a position that is more stationary.  PLOF: Independent  PATIENT GOALS Pt wants to alleviate pain, become more mobile, strengthen back musculature, strengthen LEs   OBJECTIVE:   DIAGNOSTIC FINDINGS:  LUMBAR SPINE - 2-3 VIEW   COMPARISON:  MRI 09/28/2009    FINDINGS: 4-5 mm retrolisthesis L5-S1, slightly progressive since prior examination, possibly degenerative in nature. Otherwise normal lumbar lordosis. No acute fracture of the lumbar spine. Vertebral body height is preserved. Intervertebral disc space narrowing and endplate remodeling at J4-9 and L5-S1 is in keeping with mild degenerative disc disease at these levels. Paraspinal soft tissues are unremarkable. Right sacroiliac arthrodesis has been performed with 2 partially threaded screws.   IMPRESSION: No acute fracture or traumatic listhesis.   Degenerative changes L4-S1 with 5 mm retrolisthesis L5-S1.     Electronically Signed   By: Fidela Salisbury M.D.   On: 09/17/2021 22:13  PATIENT SURVEYS:  FOTO 52 ; 61  SCREENING FOR RED FLAGS: Bowel or bladder incontinence: No Spinal tumors: No Cauda equina syndrome: No Compression fracture: No Abdominal aneurysm: No  COGNITION:  Overall cognitive status: Within functional limits for tasks assessed     SENSATION: Not tested - patient reports altered sensation in L LE/foot  POSTURE: rounded shoulders, forward head, and weight shift left  PALPATION: Moderate L medial lower leg tenderness with palpation  LUMBAR ROM:   Active  AROM  eval  Flexion 50% limited (compensation to R)  Extension 75% limited (pain)  Right lateral flexion   Left lateral flexion   Right rotation 50% limited  Left rotation 25% limited   (Blank rows = not tested)  LOWER EXTREMITY ROM:     PROM/AROM Right eval Left eval  Hip flexion WFL 119/ 90 deg.  Hip extension    Hip abduction [facility]  Hip adduction    Hip internal rotation    Hip external rotation    Knee flexion WFL 106/ 99 deg.  Knee extension    Ankle dorsiflexion WFL Limited by pain  Ankle plantarflexion WFL Limited by pain  Ankle inversion    Ankle eversion     (Blank rows = not tested)  LOWER EXTREMITY MMT:    MMT Right eval Left eval  Hip flexion 4/5 3/5  Hip  extension    Hip abduction 4/5 3/5  Hip adduction 4/5 4/5  Hip internal rotation 5/5 unable  Hip external rotation 5/5 3/5  Knee flexion 5/5 3/5 (pain)  Knee extension 5/5 4/5  Ankle dorsiflexion 4+/5 3/5  Ankle plantarflexion 5/5 4/5  Ankle inversion    Ankle eversion     (Blank rows = not tested)  FUNCTIONAL TESTS:  5 times sit to stand: 25.4 seconds .  9/21:  14.06 seconds (marked improvement).    GAIT: Distance walked: in clinic Assistive device utilized: Quad cane small base Level of assistance: Modified independence Comments: R sided Trendelenburg.  L antalgic gait pattern with decrease stance on L with short R LE step length/ shuffling.  No arm swing. Walks with wide base of support, with mild supination of B feet. Pt had limited endurance, and significantly limited L hip flexion and DF to clear her foot during swing phase.     01/25/22: 5xSTS: 15.94 sec./ 14.94 sec.  (Marked improvement).      9/21:  5xSTS: 14.06 seconds (marked improvement).      11/8: 5xSTS: 12.48 seconds  (improvement).     Treatment:  04/27/22:     There.ex.:       Nustep L5 10 min. B UE/LE at seat position 9 (consistent cadence)- 0.5 miles               3# ankle wts.: seated marching/ LAQ/ forward and lateral walking in //-bars (no UE assist)- mirror feedback (5 laps).     Walking in PT clinic/ hallway with consistent recip. Gait pattern with and without use of SPC.  No cuing today to correct upright posture/ head position.  No LOB.    Stair climbing: recip. Gait with use of B handrails 4 stairs x 2.       5xSTS:  12.48 sec.  (Improvement noted).  Cuing for equal wt. Shifting on LE.        Standing cone taps at 6" step.  B UE assist on handrails.          Discussed aquatic ex. Classes and recommended shallow water (low to moderate intensity) classes.     PATIENT EDUCATION:  Education details: Access Code: [email or web address] Person educated: Patient Education method: Explanation, Demonstration,  and Handouts Education comprehension: verbalized understanding and returned demonstration   HOME EXERCISE PROGRAM: Access Code: [email or web address] URL: [email or web address] Date: 11/25/2021 Prepared by: Dorcas Carrow  Exercises - Straight Leg Raise  - 1 x daily - 7 x weekly - 2 sets - 10 reps - Supine Heel Slide  - 1 x daily - 7 x weekly - 2 sets - 10 reps - Supine Hip Adduction Isometric with Ball  - 1 x daily - 7 x weekly - 2 sets - 10 reps - Hooklying Gluteal Sets  - 1 x daily - 7 x weekly - 2 sets - 10 reps - Seated Heel Raise  - 1 x daily - 7 x weekly - 2 sets - 10 reps - Seated Heel Toe Raises  - 1 x daily - 7 x weekly - 2 sets - 10 reps  ASSESSMENT:  CLINICAL IMPRESSION: Pt. Improving walking endurance with use of SPC and consistent reciprocal gait pattern with indoor/ outdoor surfaces.  Moderate L anterior hip pain with walking/ standing tasks and slight regression in FOTO score due to L hip pain with heavy household tasks/ stair climbing/ increase distance walked. Pt. Able to ambulate short distances without SPC but moderate increase in antalgic gait pattern.    See updated goals. Pt. Benefits from //-bars or handrails with at least 1 UE assist for safety/ decrease L hip pain.   Pt will benefit from further skilled PT in order to strengthen her LE's, and  to progress her gait mechanics.   OBJECTIVE IMPAIRMENTS Abnormal gait, decreased activity tolerance, decreased balance, decreased endurance, decreased mobility, difficulty walking, decreased ROM, decreased strength, hypomobility, impaired flexibility, impaired sensation, improper body mechanics, postural dysfunction, obesity, and pain.   ACTIVITY LIMITATIONS carrying, lifting, bending, sitting, standing, squatting, sleeping, stairs, transfers, bed mobility, dressing, and locomotion level  PARTICIPATION LIMITATIONS: cleaning, laundry, driving, shopping, community activity, occupation, and yard work  PERSONAL FACTORS Age,  Education, and Past/current experiences are also affecting patient's functional outcome.   REHAB POTENTIAL: Good  CLINICAL DECISION MAKING: Evolving/moderate complexity  EVALUATION COMPLEXITY: Moderate   GOALS: Goals reviewed with patient? Yes  SHORT TERM GOALS: Target date: 05/25/22  Pt will ambulate through the clinic with a symmetrical gait pattern, with equal step length and decreased pain for > 100 ft.  Baseline: Asymmetrical gait - increased L step length and decreased R step length Goal status: Partially met   LONG TERM GOALS: Target date: 06/22/22  Pt will increase FOTO score to > 58 Baseline: 9/7: 52 (no significant changes).  11/8: 49 (slight regression secondary to increase L anterior hip pain with stair climbing/ increase distance walked. Goal status: Not met  2.  Pt will decrease pain level to < 2/10 during all ADLs, in order to efficiently accomplish activities with decreased pain and increased independence Baseline:  Goal status: Partially met  3.  Pt will increase Active L knee flexion ROM to > 120 degrees  Baseline: 99, 106 passive.  8/8: 118 deg.  10/25: 118 deg.  Goal status: Partially met    PLAN: PT FREQUENCY: 1x/week  PT DURATION: 8 weeks  PLANNED INTERVENTIONS: Therapeutic exercises, Therapeutic activity, Neuromuscular re-education, Balance training, Gait training, Patient/Family education, Joint mobilization, Stair training, Aquatic Therapy, Cryotherapy, Moist heat, and Manual therapy.  PLAN FOR NEXT SESSION: Strengthening L LE and Hip flexion / hip abduction and stretch L iliopsoas. Gait training. Endurance emphasis.      Pura Spice, PT, DPT # [phone] 04/27/2022, 11:06 AM  [facility] [address] [place], [place], [address] Phone: [phone]   Fax:  [phone]

## 2022-05-04 ENCOUNTER — Ambulatory Visit: Payer: BC Managed Care – PPO | Admitting: Physical Therapy

## 2022-05-05 ENCOUNTER — Ambulatory Visit: Payer: Self-pay

## 2022-05-05 NOTE — Telephone Encounter (Signed)
  Chief Complaint: new medication  Symptoms: NA Frequency: NA Pertinent Negatives: Patient denies any symptoms  Disposition: [] ED /[] Urgent Care (no appt availability in office) / [x] Appointment(In office/virtual)/ []  [place] [facility]/ [] Home Care/ [] Refused Recommended Disposition /[] [place] [facility]/ []  Follow-up with PCP Additional Notes: called pt, states she is wanting to see if she can take Ozempic for her DM. Pt is up to date on labs. Tried to schedule appt for 05/09/22 but appts for 1120 and 1620 were blocked for me to schedule so advised pt I would send message to provider and see if they can CB and schedule appt. Pt verbalized understanding.   Summary: discuss medication   Pt requesting a cb to discuss if she "can get the ozempic shot"  Please fu w/ pt         Reason for Disposition  Prescription request for new medicine (not a refill)  Answer Assessment - Initial Assessment Questions 1. NAME of MEDICINE: "What medicine(s) are you calling about?"     Ozempic  2. QUESTION: "What is your question?" (e.g., double dose of medicine, side effect)     Wanting to see if she can be prescribed that 3. PRESCRIBER: "Who prescribed the medicine?" Reason: if prescribed by specialist, call should be referred to that group.     New request 4. SYMPTOMS: "Do you have any symptoms?" If Yes, ask: "What symptoms are you having?"  "How bad are the symptoms (e.g., mild, moderate, severe)     no  Protocols used: Medication Question Call-A-AH

## 2022-05-05 NOTE — Telephone Encounter (Signed)
Attempted to call patient to schedule an appt, LVMTRC. When she calls back can we schedule her please? Thank you ladies.

## 2022-05-06 ENCOUNTER — Encounter: Payer: Self-pay | Admitting: Nurse Practitioner

## 2022-05-06 ENCOUNTER — Telehealth (INDEPENDENT_AMBULATORY_CARE_PROVIDER_SITE_OTHER): Payer: BC Managed Care – PPO | Admitting: Nurse Practitioner

## 2022-05-06 DIAGNOSIS — E119 Type 2 diabetes mellitus without complications: Secondary | ICD-10-CM | POA: Diagnosis not present

## 2022-05-06 DIAGNOSIS — Z7984 Long term (current) use of oral hypoglycemic drugs: Secondary | ICD-10-CM

## 2022-05-06 MED ORDER — TIRZEPATIDE 2.5 MG/0.5ML ~~LOC~~ SOAJ: 2.5000 mg | SUBCUTANEOUS | 0 refills | Status: DC

## 2022-05-06 NOTE — Progress Notes (Signed)
LMP 08/20/2015 (Approximate) Comment: denies preg   Subjective:    Patient ID: Yesenia Stevens, female    DOB: 12-24-1965, 56 y.o.   MRN: [id number]  HPI: Yesenia Stevens is a 56 y.o. female  Chief Complaint  Patient presents with   Obesity    Patient would like to discuss starting Ozempic.    DIABETES Patient would like to start on Ozempic or Mounjaro for her diabetes.  She denies other concerns at visit today.  Currently taking Metformin, Glipizide, and Jardiance.  Denies HA, CP, SOB, dizziness, palpitations, visual changes, and lower extremity swelling.    Relevant past medical, surgical, family and social history reviewed and updated as indicated. Interim medical history since our last visit reviewed. Allergies and medications reviewed and updated.  Review of Systems  Eyes:  Negative for visual disturbance.  Respiratory:  Negative for cough, chest tightness and shortness of breath.   Cardiovascular:  Negative for chest pain, palpitations and leg swelling.  Neurological:  Negative for dizziness and headaches.    Per HPI unless specifically indicated above     Objective:    LMP 08/20/2015 (Approximate) Comment: denies preg  Wt Readings from Last 3 Encounters:  03/02/22 287 lb (130.2 kg)  12/17/21 285 lb (129.3 kg)  11/29/21 286 lb 9.6 oz (130 kg)    Physical Exam Vitals and nursing note reviewed.  HENT:     Head: Normocephalic.     Right Ear: Hearing normal.     Left Ear: Hearing normal.     Nose: Nose normal.  Eyes:     Pupils: Pupils are equal, round, and reactive to light.  Pulmonary:     Effort: Pulmonary effort is normal. No respiratory distress.  Neurological:     Mental Status: She is alert.  Psychiatric:        Mood and Affect: Mood normal.        Behavior: Behavior normal.        Thought Content: Thought content normal.        Judgment: Judgment normal.     Results for orders placed or performed in visit on 03/02/22  Comp Met (CMET)   Result Value Ref Range   Glucose 103 (H) 70 - 99 mg/dL   BUN 12 6 - 24 mg/dL   Creatinine, Ser 0.81 0.57 - 1.00 mg/dL   eGFR 86 >59 mL/min/1.73   BUN/Creatinine Ratio 15 9 - 23   Sodium 142 134 - 144 mmol/L   Potassium 4.3 3.5 - 5.2 mmol/L   Chloride 103 96 - 106 mmol/L   CO2 23 20 - 29 mmol/L   Calcium 9.4 8.7 - 10.2 mg/dL   Total Protein 7.1 6.0 - 8.5 g/dL   Albumin 3.8 3.8 - 4.9 g/dL   Globulin, Total 3.3 1.5 - 4.5 g/dL   Albumin/Globulin Ratio 1.2 1.2 - 2.2   Bilirubin Total 0.4 0.0 - 1.2 mg/dL   Alkaline Phosphatase 75 44 - 121 IU/L   AST 12 0 - 40 IU/L   ALT 8 0 - 32 IU/L  Lipid Profile  Result Value Ref Range   Cholesterol, Total 196 100 - 199 mg/dL   Triglycerides 104 0 - 149 mg/dL   HDL 47 >39 mg/dL   VLDL Cholesterol Cal 19 5 - 40 mg/dL   LDL Chol Calc (NIH) 130 (H) 0 - 99 mg/dL   Chol/HDL Ratio 4.2 0.0 - 4.4 ratio  HgB A1c  Result Value Ref Range   Hgb A1c MFr Bld  6.8 (H) 4.8 - 5.6 %   Est. average glucose Bld gHb Est-mCnc 148 mg/dL  Hepatitis C Antibody  Result Value Ref Range   Hep C Virus Ab Non Reactive Non Reactive      Assessment & Plan:   Problem List Items Addressed This Visit       Endocrine   Uncomplicated type 2 diabetes mellitus (HCC) - Primary    Chronic.  Controlled.  Will start Mounjaro 2.5mg.  Side effects and benefits of medication discussed during visit.  Labs ordered today.  Return to clinic in 1 month for reevaluation.  Call sooner if concerns arise.        Relevant Medications   tirzepatide (MOUNJARO) 2.5 MG/0.5ML Pen     Follow up plan: Return in about 1 month (around 06/05/2022) for Medication Management.   This visit was completed via MyChart due to the restrictions of the COVID-19 pandemic. All issues as above were discussed and addressed. Physical exam was done as above through visual confirmation on MyChart. If it was felt that the patient should be evaluated in the office, they were directed there. The patient verbally  consented to this visit. Location of the patient: Home Location of the provider: Office Those involved with this call:  Provider: Karen Holdsworth, NP CMA: Val Hernandez, CMA Front Desk/Registration: Iris Parrish This encounter was conducted via video.  I spent 30 dedicated to the care of this patient on the date of this encounter to include previsit review of symptoms, plan of care, medications, side effects and benefits, face to face time with the patient, and post visit ordering of testing.      

## 2022-05-06 NOTE — Telephone Encounter (Signed)
Pt scheduled 11/17

## 2022-05-06 NOTE — Progress Notes (Signed)
Pt scheduled  

## 2022-05-06 NOTE — Assessment & Plan Note (Signed)
Chronic.  Controlled.  Will start Mounjaro 2.5mg .  Side effects and benefits of medication discussed during visit.  Labs ordered today.  Return to clinic in 1 month for reevaluation.  Call sooner if concerns arise.

## 2022-05-10 ENCOUNTER — Encounter: Payer: Self-pay | Admitting: Physical Therapy

## 2022-05-10 ENCOUNTER — Ambulatory Visit: Payer: BC Managed Care – PPO | Admitting: Physical Therapy

## 2022-05-10 DIAGNOSIS — M25652 Stiffness of left hip, not elsewhere classified: Secondary | ICD-10-CM | POA: Diagnosis not present

## 2022-05-10 DIAGNOSIS — R2689 Other abnormalities of gait and mobility: Secondary | ICD-10-CM

## 2022-05-10 DIAGNOSIS — R269 Unspecified abnormalities of gait and mobility: Secondary | ICD-10-CM

## 2022-05-10 DIAGNOSIS — M25552 Pain in left hip: Secondary | ICD-10-CM

## 2022-05-10 DIAGNOSIS — M5459 Other low back pain: Secondary | ICD-10-CM

## 2022-05-10 DIAGNOSIS — M6281 Muscle weakness (generalized): Secondary | ICD-10-CM

## 2022-05-10 NOTE — Therapy (Signed)
OUTPATIENT PHYSICAL THERAPY THORACOLUMBAR TREATMENT  Patient Name: Yesenia Stevens MRN: [id number] DOB:06-Mar-1966, 56 y.o., female Today's Date: 05/10/2022   PT End of Session -     Visit Number 34   Number of Visits  41   Date for PT Re-Evaluation 06/22/22    PT Start Time 1118 to 1202 (44 minutes)    Equipment Utilized During Treatment No gait belt   Activity Tolerance Patient limited by pain;Patient limited by fatigue    Behavior During Therapy [facility] for tasks assessed/performed          Past Medical History:  Diagnosis Date   Diabetes mellitus without complication ([place])    Hypertension    Past Surgical History:  Procedure Laterality Date   CESAREAN SECTION     CHOLECYSTECTOMY     COLONOSCOPY WITH PROPOFOL N/A 12/10/2019   Procedure: COLONOSCOPY WITH PROPOFOL;  Surgeon: [name], MD;  Location: [facility] ENDOSCOPY;  Service: Gastroenterology;  Laterality: N/A;   COLONOSCOPY WITH PROPOFOL N/A 12/24/2019   Procedure: COLONOSCOPY WITH PROPOFOL;  Surgeon: [name], MD;  Location: [facility] ENDOSCOPY;  Service: Gastroenterology;  Laterality: N/A;   FRACTURE SURGERY     Patient Active Problem List   Diagnosis Date Noted   Hyperlipidemia associated with type 2 diabetes mellitus ([place]) 09/22/2021   Palpitations 03/19/2020   Diarrhea 03/19/2020   Premature atrial contractions 08/20/2018   Morbid (severe) obesity due to excess calories ([place]) 08/01/2018   Shortness of breath 07/26/2018   Chronic cough 65/68/1275   Uncomplicated type 2 diabetes mellitus ([place]) 03/22/2018   Hypertension 03/22/2018    PCP: Jon Billings, NP   REFERRING PROVIDER: Katherine Mantle, MD  REFERRING DIAG:  Diagnosis  S32.15XD (ICD-10-CM) - Type 2 fracture of sacrum, subsequent encounter for fracture with routine healing  S32.422D (ICD-10-CM) - Displaced fracture of posterior wall of left acetabulum, subsequent encounter for fracture with routine healing    Rationale for Evaluation and Treatment  Rehabilitation  THERAPY DIAG:  Stiffness of left hip joint  Other low back pain  Gait difficulty  Pain in left hip  Balance problems  Muscle weakness (generalized)  ONSET DATE: 05/28/21  SUBJECTIVE:                                                                                                                                                                                           SUBJECTIVE STATEMENT: 05/10/22:  Pt. Continues to attend aquatic ex. Program.  Pt. Reports 3/10 L anterior hip pain.  Pt. Cancelled last PT tx. Session secondary to increase hip/back pain.    PERTINENT HISTORY:  Hx of L hip dislocation, and screws placed  in lower back after her injury. Pt left hospital in a walker, and utilized a WC originally during [facility]. Pt has decreased ankle DF in L ankle, and pain in her lateral foot / 5th metatarsal. Pt had L knee scope, and has tenderness/bruising in L LE along tibia.  PAIN:  Are you having pain? NPRS scale: 3/10 Pain location: L hip (anterior) Pain description: popping and dull/achy Aggravating factors: standing, bending forward (especially left) Relieving factors: sitting down, tylenol PRN   PRECAUTIONS: Anterior hip, Posterior hip, Knee, and Fall  WEIGHT BEARING RESTRICTIONS no  FALLS:  Has patient fallen in last 6 months? Yes. Number of falls 1  LIVING ENVIRONMENT: Lives with: lives with their family Lives in: House/apartment Stairs: yes - 2 to get into home with raining Has following equipment at home: Quad cane large base and Wheelchair (manual)  OCCUPATION:  Pt is currently on long term disability. Previously worked at [employer] prior to injury. Would like to get back to work if the company can find a position that is more stationary.  PLOF: Independent  PATIENT GOALS Pt wants to alleviate pain, become more mobile, strengthen back musculature, strengthen LEs   OBJECTIVE:   DIAGNOSTIC FINDINGS:  LUMBAR SPINE - 2-3 VIEW    COMPARISON:  MRI 09/28/2009   FINDINGS: 4-5 mm retrolisthesis L5-S1, slightly progressive since prior examination, possibly degenerative in nature. Otherwise normal lumbar lordosis. No acute fracture of the lumbar spine. Vertebral body height is preserved. Intervertebral disc space narrowing and endplate remodeling at O1-6 and L5-S1 is in keeping with mild degenerative disc disease at these levels. Paraspinal soft tissues are unremarkable. Right sacroiliac arthrodesis has been performed with 2 partially threaded screws.   IMPRESSION: No acute fracture or traumatic listhesis.   Degenerative changes L4-S1 with 5 mm retrolisthesis L5-S1.     Electronically Signed   By: Fidela Salisbury M.D.   On: 09/17/2021 22:13  PATIENT SURVEYS:  FOTO 52 ; 61  SCREENING FOR RED FLAGS: Bowel or bladder incontinence: No Spinal tumors: No Cauda equina syndrome: No Compression fracture: No Abdominal aneurysm: No  COGNITION:  Overall cognitive status: Within functional limits for tasks assessed     SENSATION: Not tested - patient reports altered sensation in L LE/foot  POSTURE: rounded shoulders, forward head, and weight shift left  PALPATION: Moderate L medial lower leg tenderness with palpation  LUMBAR ROM:   Active  AROM  eval  Flexion 50% limited (compensation to R)  Extension 75% limited (pain)  Right lateral flexion   Left lateral flexion   Right rotation 50% limited  Left rotation 25% limited   (Blank rows = not tested)  LOWER EXTREMITY ROM:     PROM/AROM Right eval Left eval  Hip flexion WFL 119/ 90 deg.  Hip extension    Hip abduction [facility]  Hip adduction    Hip internal rotation    Hip external rotation    Knee flexion WFL 106/ 99 deg.  Knee extension    Ankle dorsiflexion WFL Limited by pain  Ankle plantarflexion WFL Limited by pain  Ankle inversion    Ankle eversion     (Blank rows = not tested)  LOWER EXTREMITY MMT:    MMT Right eval Left eval   Hip flexion 4/5 3/5  Hip extension    Hip abduction 4/5 3/5  Hip adduction 4/5 4/5  Hip internal rotation 5/5 unable  Hip external rotation 5/5 3/5  Knee flexion 5/5 3/5 (pain)  Knee extension 5/5  4/5  Ankle dorsiflexion 4+/5 3/5  Ankle plantarflexion 5/5 4/5  Ankle inversion    Ankle eversion     (Blank rows = not tested)  FUNCTIONAL TESTS:  5 times sit to stand: 25.4 seconds .  9/21:  14.06 seconds (marked improvement).    GAIT: Distance walked: in clinic Assistive device utilized: Quad cane small base Level of assistance: Modified independence Comments: R sided Trendelenburg.  L antalgic gait pattern with decrease stance on L with short R LE step length/ shuffling.  No arm swing. Walks with wide base of support, with mild supination of B feet. Pt had limited endurance, and significantly limited L hip flexion and DF to clear her foot during swing phase.     01/25/22: 5xSTS: 15.94 sec./ 14.94 sec.  (Marked improvement).      9/21:  5xSTS: 14.06 seconds (marked improvement).      11/8: 5xSTS: 12.48 seconds  (improvement).     Treatment:  05/10/22:     There.ex.:       Nustep L5 10 min. B UE/LE at seat position 9 (consistent cadence)- 0.5 miles               3# ankle wts.: seated marching/ LAQ/ forward and lateral walking in //-bars (no UE assist)- mirror feedback (5 laps).  Walking over 3" plinths in //-bars with light UE assist 2 laps.  Focus on L hip flexion/ preventing hip circumduction.     Walking in PT clinic/ hallway with consistent recip. Gait pattern with and without use of SPC.  No cuing today to correct upright posture/ head position.  No LOB.    Stair climbing: recip. Gait with use of B handrails 4 stairs x 2.  Increase L hip pain.     2nd step hamstring stretches/ lunges 5x on L LE.           Discussed aquatic ex. Classes.    PATIENT EDUCATION:  Education details: Access Code: [phone] Person educated: Patient Education method: Explanation,  Demonstration, and Handouts Education comprehension: verbalized understanding and returned demonstration   HOME EXERCISE PROGRAM: Access Code: [email or web address] URL: [email or web address] Date: 11/25/2021 Prepared by: Dorcas Carrow  Exercises - Straight Leg Raise  - 1 x daily - 7 x weekly - 2 sets - 10 reps - Supine Heel Slide  - 1 x daily - 7 x weekly - 2 sets - 10 reps - Supine Hip Adduction Isometric with Ball  - 1 x daily - 7 x weekly - 2 sets - 10 reps - Hooklying Gluteal Sets  - 1 x daily - 7 x weekly - 2 sets - 10 reps - Seated Heel Raise  - 1 x daily - 7 x weekly - 2 sets - 10 reps - Seated Heel Toe Raises  - 1 x daily - 7 x weekly - 2 sets - 10 reps  ASSESSMENT:  CLINICAL IMPRESSION: Pt. Improving walking endurance with use of SPC and consistent reciprocal gait pattern with indoor/ outdoor surfaces.  Moderate L anterior hip pain with walking/ standing tasks.  Pt. Able to ambulate short distances without SPC but moderate increase in antalgic gait pattern.    Reviewed standing hip posture/ LLD (L knee valgus noted during gait).  Pt. Benefits from //-bars or handrails with at least 1 UE assist for safety/ decrease L hip pain.   Pt will benefit from further skilled PT in order to strengthen her LE's, and to progress her gait mechanics.   OBJECTIVE IMPAIRMENTS Abnormal gait, decreased  activity tolerance, decreased balance, decreased endurance, decreased mobility, difficulty walking, decreased ROM, decreased strength, hypomobility, impaired flexibility, impaired sensation, improper body mechanics, postural dysfunction, obesity, and pain.   ACTIVITY LIMITATIONS carrying, lifting, bending, sitting, standing, squatting, sleeping, stairs, transfers, bed mobility, dressing, and locomotion level  PARTICIPATION LIMITATIONS: cleaning, laundry, driving, shopping, community activity, occupation, and yard work  PERSONAL FACTORS Age, Education, and Past/current experiences are also affecting  patient's functional outcome.   REHAB POTENTIAL: Good  CLINICAL DECISION MAKING: Evolving/moderate complexity  EVALUATION COMPLEXITY: Moderate   GOALS: Goals reviewed with patient? Yes  SHORT TERM GOALS: Target date: 05/25/22  Pt will ambulate through the clinic with a symmetrical gait pattern, with equal step length and decreased pain for > 100 ft.  Baseline: Asymmetrical gait - increased L step length and decreased R step length Goal status: Partially met   LONG TERM GOALS: Target date: 06/22/22  Pt will increase FOTO score to > 58 Baseline: 9/7: 52 (no significant changes).  11/8: 49 (slight regression secondary to increase L anterior hip pain with stair climbing/ increase distance walked. Goal status: Not met  2.  Pt will decrease pain level to < 2/10 during all ADLs, in order to efficiently accomplish activities with decreased pain and increased independence Baseline:  Goal status: Partially met  3.  Pt will increase Active L knee flexion ROM to > 120 degrees  Baseline: 99, 106 passive.  8/8: 118 deg.  10/25: 118 deg.  Goal status: Partially met    PLAN: PT FREQUENCY: 1x/week  PT DURATION: 8 weeks  PLANNED INTERVENTIONS: Therapeutic exercises, Therapeutic activity, Neuromuscular re-education, Balance training, Gait training, Patient/Family education, Joint mobilization, Stair training, Aquatic Therapy, Cryotherapy, Moist heat, and Manual therapy.  PLAN FOR NEXT SESSION: Strengthening L LE and Hip flexion / hip abduction and stretch L iliopsoas. Gait training. Endurance emphasis.      Pura Spice, PT, DPT # [id number] 05/10/2022, 12:16 PM  [facility] [address] [place], [place], [address] Phone: [phone]   Fax:  [phone]

## 2022-05-19 ENCOUNTER — Ambulatory Visit: Payer: BC Managed Care – PPO | Admitting: Physical Therapy

## 2022-05-19 DIAGNOSIS — R2689 Other abnormalities of gait and mobility: Secondary | ICD-10-CM

## 2022-05-19 DIAGNOSIS — M6281 Muscle weakness (generalized): Secondary | ICD-10-CM

## 2022-05-19 DIAGNOSIS — M25652 Stiffness of left hip, not elsewhere classified: Secondary | ICD-10-CM | POA: Diagnosis not present

## 2022-05-19 DIAGNOSIS — M5459 Other low back pain: Secondary | ICD-10-CM

## 2022-05-19 DIAGNOSIS — M25552 Pain in left hip: Secondary | ICD-10-CM

## 2022-05-19 DIAGNOSIS — R269 Unspecified abnormalities of gait and mobility: Secondary | ICD-10-CM

## 2022-05-19 NOTE — Therapy (Signed)
OUTPATIENT PHYSICAL THERAPY THORACOLUMBAR TREATMENT  Patient Name: Yesenia Stevens MRN: [id number] DOB:12-28-1965, 56 y.o., female Today's Date: 05/19/2022   PT End of Session -     Visit Number 35   Number of Visits  41   Date for PT Re-Evaluation 06/22/22    PT Start Time 0944 to 1033 (49 minutes)    Equipment Utilized During Treatment No gait belt   Activity Tolerance Patient limited by pain;Patient limited by fatigue    Behavior During Therapy [facility] for tasks assessed/performed          Past Medical History:  Diagnosis Date   Diabetes mellitus without complication ([place])    Hypertension    Past Surgical History:  Procedure Laterality Date   CESAREAN SECTION     CHOLECYSTECTOMY     COLONOSCOPY WITH PROPOFOL N/A 12/10/2019   Procedure: COLONOSCOPY WITH PROPOFOL;  Surgeon: [name], MD;  Location: [facility] ENDOSCOPY;  Service: Gastroenterology;  Laterality: N/A;   COLONOSCOPY WITH PROPOFOL N/A 12/24/2019   Procedure: COLONOSCOPY WITH PROPOFOL;  Surgeon: [name], MD;  Location: [facility] ENDOSCOPY;  Service: Gastroenterology;  Laterality: N/A;   FRACTURE SURGERY     Patient Active Problem List   Diagnosis Date Noted   Hyperlipidemia associated with type 2 diabetes mellitus ([place]) 09/22/2021   Palpitations 03/19/2020   Diarrhea 03/19/2020   Premature atrial contractions 08/20/2018   Morbid (severe) obesity due to excess calories ([place]) 08/01/2018   Shortness of breath 07/26/2018   Chronic cough 26/20/3559   Uncomplicated type 2 diabetes mellitus ([place]) 03/22/2018   Hypertension 03/22/2018    PCP: Jon Billings, NP   REFERRING PROVIDER: Katherine Mantle, MD  REFERRING DIAG:  Diagnosis  S32.15XD (ICD-10-CM) - Type 2 fracture of sacrum, subsequent encounter for fracture with routine healing  S32.422D (ICD-10-CM) - Displaced fracture of posterior wall of left acetabulum, subsequent encounter for fracture with routine healing    Rationale for Evaluation and Treatment  Rehabilitation  THERAPY DIAG:  Stiffness of left hip joint  Other low back pain  Gait difficulty  Pain in left hip  Balance problems  Muscle weakness (generalized)  ONSET DATE: 05/28/21  SUBJECTIVE:                                                                                                                                                                                           SUBJECTIVE STATEMENT: 05/19/22:  Pt. Has not been to back to aquatic ex. due to Thanksgiving holiday and L hip hurting.  Pt. Reports 4/10 L hip pain prior to tx. Session.  Pt. Reports being tired today.     PERTINENT HISTORY:  Hx of L hip dislocation, and screws placed in lower back after her injury. Pt left hospital in a walker, and utilized a WC originally during [facility]. Pt has decreased ankle DF in L ankle, and pain in her lateral foot / 5th metatarsal. Pt had L knee scope, and has tenderness/bruising in L LE along tibia.  PAIN:  Are you having pain? NPRS scale: 4/10 Pain location: L hip (anterior) Pain description: popping and dull/achy Aggravating factors: standing, bending forward (especially left) Relieving factors: sitting down, tylenol PRN   PRECAUTIONS: Anterior hip, Posterior hip, Knee, and Fall  WEIGHT BEARING RESTRICTIONS no  FALLS:  Has patient fallen in last 6 months? Yes. Number of falls 1  LIVING ENVIRONMENT: Lives with: lives with their family Lives in: House/apartment Stairs: yes - 2 to get into home with raining Has following equipment at home: Quad cane large base and Wheelchair (manual)  OCCUPATION:  Pt is currently on long term disability. Previously worked at [employer] prior to injury. Would like to get back to work if the company can find a position that is more stationary.  PLOF: Independent  PATIENT GOALS Pt wants to alleviate pain, become more mobile, strengthen back musculature, strengthen LEs   OBJECTIVE:   DIAGNOSTIC FINDINGS:  LUMBAR SPINE - 2-3  VIEW   COMPARISON:  MRI 09/28/2009   FINDINGS: 4-5 mm retrolisthesis L5-S1, slightly progressive since prior examination, possibly degenerative in nature. Otherwise normal lumbar lordosis. No acute fracture of the lumbar spine. Vertebral body height is preserved. Intervertebral disc space narrowing and endplate remodeling at N8-2 and L5-S1 is in keeping with mild degenerative disc disease at these levels. Paraspinal soft tissues are unremarkable. Right sacroiliac arthrodesis has been performed with 2 partially threaded screws.   IMPRESSION: No acute fracture or traumatic listhesis.   Degenerative changes L4-S1 with 5 mm retrolisthesis L5-S1.     Electronically Signed   By: Fidela Salisbury M.D.   On: 09/17/2021 22:13  PATIENT SURVEYS:  FOTO 52 ; 61  SCREENING FOR RED FLAGS: Bowel or bladder incontinence: No Spinal tumors: No Cauda equina syndrome: No Compression fracture: No Abdominal aneurysm: No  COGNITION:  Overall cognitive status: Within functional limits for tasks assessed     SENSATION: Not tested - patient reports altered sensation in L LE/foot  POSTURE: rounded shoulders, forward head, and weight shift left  PALPATION: Moderate L medial lower leg tenderness with palpation  LUMBAR ROM:   Active  AROM  eval  Flexion 50% limited (compensation to R)  Extension 75% limited (pain)  Right lateral flexion   Left lateral flexion   Right rotation 50% limited  Left rotation 25% limited   (Blank rows = not tested)  LOWER EXTREMITY ROM:     PROM/AROM Right eval Left eval  Hip flexion WFL 119/ 90 deg.  Hip extension    Hip abduction [facility]  Hip adduction    Hip internal rotation    Hip external rotation    Knee flexion WFL 106/ 99 deg.  Knee extension    Ankle dorsiflexion WFL Limited by pain  Ankle plantarflexion WFL Limited by pain  Ankle inversion    Ankle eversion     (Blank rows = not tested)  LOWER EXTREMITY MMT:    MMT Right eval  Left eval  Hip flexion 4/5 3/5  Hip extension    Hip abduction 4/5 3/5  Hip adduction 4/5 4/5  Hip internal rotation 5/5 unable  Hip external rotation 5/5 3/5  Knee  flexion 5/5 3/5 (pain)  Knee extension 5/5 4/5  Ankle dorsiflexion 4+/5 3/5  Ankle plantarflexion 5/5 4/5  Ankle inversion    Ankle eversion     (Blank rows = not tested)  FUNCTIONAL TESTS:  5 times sit to stand: 25.4 seconds .  9/21:  14.06 seconds (marked improvement).    GAIT: Distance walked: in clinic Assistive device utilized: Quad cane small base Level of assistance: Modified independence Comments: R sided Trendelenburg.  L antalgic gait pattern with decrease stance on L with short R LE step length/ shuffling.  No arm swing. Walks with wide base of support, with mild supination of B feet. Pt had limited endurance, and significantly limited L hip flexion and DF to clear her foot during swing phase.     01/25/22: 5xSTS: 15.94 sec./ 14.94 sec.  (Marked improvement).      9/21:  5xSTS: 14.06 seconds (marked improvement).      11/8: 5xSTS: 12.48 seconds  (improvement).     Treatment:  05/19/22:     There.ex.:       Nustep L5 10 min. B UE/LE at seat position 9 (consistent cadence)- 0.45 miles.  L hip discomfort getting off the Nustep.                 Walking in //-bars over 6" hurdles with light UE assist 3 laps.  Focus on L hip flexion/ preventing hip circumduction.  Lateral walking in //-bars with cuing to correct upright posture 3 laps.     Walking in PT clinic/ hallway with consistent recip. Gait pattern with and without use of SPC.  No cuing today to correct upright posture/ head position.  No LOB but moderate c/o L hip pain.     Supine L SLR (assist)/ marching 5x (pain limited).  Supine hamstring/ knee to chest/ trunk rotn. Stretches 3x each (pt. Very pain limited in L anterior hip).  Pt. Requires assist to go from supine to seated posture on mat table.      Discussed return to aquatic ex.  Classes.    PATIENT EDUCATION:  Education details: Access Code: [email or web address] Person educated: Patient Education method: Explanation, Demonstration, and Handouts Education comprehension: verbalized understanding and returned demonstration   HOME EXERCISE PROGRAM: Access Code: [email or web address] URL: [email or web address] Date: 11/25/2021 Prepared by: Dorcas Carrow  Exercises - Straight Leg Raise  - 1 x daily - 7 x weekly - 2 sets - 10 reps - Supine Heel Slide  - 1 x daily - 7 x weekly - 2 sets - 10 reps - Supine Hip Adduction Isometric with Ball  - 1 x daily - 7 x weekly - 2 sets - 10 reps - Hooklying Gluteal Sets  - 1 x daily - 7 x weekly - 2 sets - 10 reps - Seated Heel Raise  - 1 x daily - 7 x weekly - 2 sets - 10 reps - Seated Heel Toe Raises  - 1 x daily - 7 x weekly - 2 sets - 10 reps  ASSESSMENT:  CLINICAL IMPRESSION: Pt. Limited by moderate to high levels of L anterior hip pain with walking/ supine ex.  Pt. Able to ambulate short distances without SPC but moderate increase in antalgic gait pattern.  Palpable pain in L anterior hip/thigh in supine position.   Pt. Benefits from //-bars or handrails with at least 1 UE assist for safety/ decrease L hip pain.   Pt will benefit from further skilled PT in order to strengthen her LE's, and to  progress her gait mechanics.   OBJECTIVE IMPAIRMENTS Abnormal gait, decreased activity tolerance, decreased balance, decreased endurance, decreased mobility, difficulty walking, decreased ROM, decreased strength, hypomobility, impaired flexibility, impaired sensation, improper body mechanics, postural dysfunction, obesity, and pain.   ACTIVITY LIMITATIONS carrying, lifting, bending, sitting, standing, squatting, sleeping, stairs, transfers, bed mobility, dressing, and locomotion level  PARTICIPATION LIMITATIONS: cleaning, laundry, driving, shopping, community activity, occupation, and yard work  PERSONAL FACTORS Age, Education, and  Past/current experiences are also affecting patient's functional outcome.   REHAB POTENTIAL: Good  CLINICAL DECISION MAKING: Evolving/moderate complexity  EVALUATION COMPLEXITY: Moderate   GOALS: Goals reviewed with patient? Yes  SHORT TERM GOALS: Target date: 05/25/22  Pt will ambulate through the clinic with a symmetrical gait pattern, with equal step length and decreased pain for > 100 ft.  Baseline: Asymmetrical gait - increased L step length and decreased R step length Goal status: Partially met   LONG TERM GOALS: Target date: 06/22/22  Pt will increase FOTO score to > 58 Baseline: 9/7: 52 (no significant changes).  11/8: 49 (slight regression secondary to increase L anterior hip pain with stair climbing/ increase distance walked. Goal status: Not met  2.  Pt will decrease pain level to < 2/10 during all ADLs, in order to efficiently accomplish activities with decreased pain and increased independence Baseline:  Goal status: Partially met  3.  Pt will increase Active L knee flexion ROM to > 120 degrees  Baseline: 99, 106 passive.  8/8: 118 deg.  10/25: 118 deg.  Goal status: Partially met    PLAN: PT FREQUENCY: 1x/week  PT DURATION: 8 weeks  PLANNED INTERVENTIONS: Therapeutic exercises, Therapeutic activity, Neuromuscular re-education, Balance training, Gait training, Patient/Family education, Joint mobilization, Stair training, Aquatic Therapy, Cryotherapy, Moist heat, and Manual therapy.  PLAN FOR NEXT SESSION: Strengthening L LE and Hip flexion / hip abduction and stretch L iliopsoas. Gait training. Endurance emphasis.      Pura Spice, PT, DPT # [phone] 05/19/2022, 5:19 PM  [facility] [address] [place], [place], [address] Phone: [phone]   Fax:  [phone]

## 2022-05-24 ENCOUNTER — Ambulatory Visit: Payer: BC Managed Care – PPO | Attending: Trauma Surgery | Admitting: Physical Therapy

## 2022-05-24 DIAGNOSIS — M25652 Stiffness of left hip, not elsewhere classified: Secondary | ICD-10-CM | POA: Insufficient documentation

## 2022-05-24 DIAGNOSIS — M6281 Muscle weakness (generalized): Secondary | ICD-10-CM | POA: Diagnosis present

## 2022-05-24 DIAGNOSIS — M5459 Other low back pain: Secondary | ICD-10-CM | POA: Diagnosis present

## 2022-05-24 DIAGNOSIS — R269 Unspecified abnormalities of gait and mobility: Secondary | ICD-10-CM | POA: Diagnosis present

## 2022-05-24 DIAGNOSIS — R2689 Other abnormalities of gait and mobility: Secondary | ICD-10-CM | POA: Diagnosis present

## 2022-05-24 DIAGNOSIS — M25552 Pain in left hip: Secondary | ICD-10-CM | POA: Insufficient documentation

## 2022-05-24 NOTE — Therapy (Signed)
OUTPATIENT PHYSICAL THERAPY THORACOLUMBAR TREATMENT  Patient Name: Yesenia Stevens MRN: [id number] DOB:[birth year], 56 y.o., female Today's Date: 05/24/2022   PT End of Session -     Visit Number 36   Number of Visits  41   Date for PT Re-Evaluation 06/22/22    PT Start Time 0944 to 1031 (47 minutes)    Equipment Utilized During Treatment No gait belt   Activity Tolerance Patient limited by pain;Patient limited by fatigue    Behavior During Therapy [facility] for tasks assessed/performed          Past Medical History:  Diagnosis Date   Diabetes mellitus without complication ([place])    Hypertension    Past Surgical History:  Procedure Laterality Date   CESAREAN SECTION     CHOLECYSTECTOMY     COLONOSCOPY WITH PROPOFOL N/A 12/10/2019   Procedure: COLONOSCOPY WITH PROPOFOL;  Surgeon: [name], MD;  Location: [facility] ENDOSCOPY;  Service: Gastroenterology;  Laterality: N/A;   COLONOSCOPY WITH PROPOFOL N/A 12/24/2019   Procedure: COLONOSCOPY WITH PROPOFOL;  Surgeon: [name], MD;  Location: [facility] ENDOSCOPY;  Service: Gastroenterology;  Laterality: N/A;   FRACTURE SURGERY     Patient Active Problem List   Diagnosis Date Noted   Hyperlipidemia associated with type 2 diabetes mellitus ([place]) 09/22/2021   Palpitations 03/19/2020   Diarrhea 03/19/2020   Premature atrial contractions 08/20/2018   Morbid (severe) obesity due to excess calories ([place]) 08/01/2018   Shortness of breath 07/26/2018   Chronic cough 55/37/4827   Uncomplicated type 2 diabetes mellitus ([place]) 03/22/2018   Hypertension 03/22/2018    PCP: Jon Billings, NP   REFERRING PROVIDER: Katherine Mantle, MD  REFERRING DIAG:  Diagnosis  S32.15XD (ICD-10-CM) - Type 2 fracture of sacrum, subsequent encounter for fracture with routine healing  S32.422D (ICD-10-CM) - Displaced fracture of posterior wall of left acetabulum, subsequent encounter for fracture with routine healing    Rationale for Evaluation and Treatment  Rehabilitation  THERAPY DIAG:  Stiffness of left hip joint  Other low back pain  Gait difficulty  Pain in left hip  Balance problems  Muscle weakness (generalized)  ONSET DATE: 05/28/21  SUBJECTIVE:                                                                                                                                                                                           SUBJECTIVE STATEMENT: 05/24/22:  Pt. Still has not returned to aquatic ex. due to L hip hurting/ busy.  Pt. Reports 3/10 L hip pain prior to tx. Session.  Pt. Returns to MD on 05/30/22.  Pt. States this Saturday is 1 year since  L hip injury.    PERTINENT HISTORY:  Hx of L hip dislocation, and screws placed in lower back after her injury. Pt left hospital in a walker, and utilized a WC originally during [facility]. Pt has decreased ankle DF in L ankle, and pain in her lateral foot / 5th metatarsal. Pt had L knee scope, and has tenderness/bruising in L LE along tibia.  PAIN:  Are you having pain? NPRS scale: 3/10 Pain location: L hip (anterior) Pain description: popping and dull/achy Aggravating factors: standing, bending forward (especially left) Relieving factors: sitting down, tylenol PRN   PRECAUTIONS: Anterior hip, Posterior hip, Knee, and Fall  WEIGHT BEARING RESTRICTIONS no  FALLS:  Has patient fallen in last 6 months? Yes. Number of falls 1  LIVING ENVIRONMENT: Lives with: lives with their family Lives in: House/apartment Stairs: yes - 2 to get into home with raining Has following equipment at home: Quad cane large base and Wheelchair (manual)  OCCUPATION:  Pt is currently on long term disability. Previously worked at [employer] prior to injury. Would like to get back to work if the company can find a position that is more stationary.  PLOF: Independent  PATIENT GOALS Pt wants to alleviate pain, become more mobile, strengthen back musculature, strengthen LEs   OBJECTIVE:    DIAGNOSTIC FINDINGS:  LUMBAR SPINE - 2-3 VIEW   COMPARISON:  MRI 09/28/2009   FINDINGS: 4-5 mm retrolisthesis L5-S1, slightly progressive since prior examination, possibly degenerative in nature. Otherwise normal lumbar lordosis. No acute fracture of the lumbar spine. Vertebral body height is preserved. Intervertebral disc space narrowing and endplate remodeling at S9-6 and L5-S1 is in keeping with mild degenerative disc disease at these levels. Paraspinal soft tissues are unremarkable. Right sacroiliac arthrodesis has been performed with 2 partially threaded screws.   IMPRESSION: No acute fracture or traumatic listhesis.   Degenerative changes L4-S1 with 5 mm retrolisthesis L5-S1.     Electronically Signed   By: Fidela Salisbury M.D.   On: 09/17/2021 22:13  PATIENT SURVEYS:  FOTO 52 ; 61  SCREENING FOR RED FLAGS: Bowel or bladder incontinence: No Spinal tumors: No Cauda equina syndrome: No Compression fracture: No Abdominal aneurysm: No  COGNITION:  Overall cognitive status: Within functional limits for tasks assessed     SENSATION: Not tested - patient reports altered sensation in L LE/foot  POSTURE: rounded shoulders, forward head, and weight shift left  PALPATION: Moderate L medial lower leg tenderness with palpation  LUMBAR ROM:   Active  AROM  eval  Flexion 50% limited (compensation to R)  Extension 75% limited (pain)  Right lateral flexion   Left lateral flexion   Right rotation 50% limited  Left rotation 25% limited   (Blank rows = not tested)  LOWER EXTREMITY ROM:     PROM/AROM Right eval Left eval  Hip flexion WFL 119/ 90 deg.  Hip extension    Hip abduction [facility]  Hip adduction    Hip internal rotation    Hip external rotation    Knee flexion WFL 106/ 99 deg.  Knee extension    Ankle dorsiflexion WFL Limited by pain  Ankle plantarflexion WFL Limited by pain  Ankle inversion    Ankle eversion     (Blank rows = not  tested)  LOWER EXTREMITY MMT:    MMT Right eval Left eval  Hip flexion 4/5 3/5  Hip extension    Hip abduction 4/5 3/5  Hip adduction 4/5 4/5  Hip internal rotation 5/5  unable  Hip external rotation 5/5 3/5  Knee flexion 5/5 3/5 (pain)  Knee extension 5/5 4/5  Ankle dorsiflexion 4+/5 3/5  Ankle plantarflexion 5/5 4/5  Ankle inversion    Ankle eversion     (Blank rows = not tested)  FUNCTIONAL TESTS:  5 times sit to stand: 25.4 seconds .  9/21:  14.06 seconds (marked improvement).    GAIT: Distance walked: in clinic Assistive device utilized: Quad cane small base Level of assistance: Modified independence Comments: R sided Trendelenburg.  L antalgic gait pattern with decrease stance on L with short R LE step length/ shuffling.  No arm swing. Walks with wide base of support, with mild supination of B feet. Pt had limited endurance, and significantly limited L hip flexion and DF to clear her foot during swing phase.     01/25/22: 5xSTS: 15.94 sec./ 14.94 sec.  (Marked improvement).      9/21:  5xSTS: 14.06 seconds (marked improvement).      11/8: 5xSTS: 12.48 seconds  (improvement).     Treatment:  05/24/22:     There.ex.:       Nustep L5 10 min. B UE/LE at seat position 9 (consistent cadence)- 0.46 miles.  Discussed walking/ daily tasks/ returning to pool.                 Walking in hallway (3 laps)/ //-bars forward/backwards/lateral walking in //-bars with cuing to correct upright posture 3 laps.  L hip pain remained 3-4/10 with walking.     Standing 6" step touches with light to no UE assist in //-bars.    Step ups/downs in //-bars on 6" step (B UE assist required for safety).    No supine ex. Today.     Discussed return to aquatic ex. Classes/ HEP.    PATIENT EDUCATION:  Education details: Access Code: [phone] Person educated: Patient Education method: Explanation, Demonstration, and Handouts Education comprehension: verbalized understanding and returned  demonstration   HOME EXERCISE PROGRAM: Access Code: [email or web address] URL: [email or web address] Date: 11/25/2021 Prepared by: Dorcas Carrow  Exercises - Straight Leg Raise  - 1 x daily - 7 x weekly - 2 sets - 10 reps - Supine Heel Slide  - 1 x daily - 7 x weekly - 2 sets - 10 reps - Supine Hip Adduction Isometric with Ball  - 1 x daily - 7 x weekly - 2 sets - 10 reps - Hooklying Gluteal Sets  - 1 x daily - 7 x weekly - 2 sets - 10 reps - Seated Heel Raise  - 1 x daily - 7 x weekly - 2 sets - 10 reps - Seated Heel Toe Raises  - 1 x daily - 7 x weekly - 2 sets - 10 reps  ASSESSMENT:  CLINICAL IMPRESSION: Pt. Had less overall L hip pain during tx. Session.  Pt. Continues to benefit from [facility] for safety with gait but able to ambulate short distances without SPC but moderate increase in antalgic gait pattern.   Pt. Benefits from //-bars or handrails with at least 1 UE assist for safety/ decrease L hip pain with standing ex.   Pt will benefit from further skilled PT in order to strengthen her LE's, and to progress her gait mechanics.   OBJECTIVE IMPAIRMENTS Abnormal gait, decreased activity tolerance, decreased balance, decreased endurance, decreased mobility, difficulty walking, decreased ROM, decreased strength, hypomobility, impaired flexibility, impaired sensation, improper body mechanics, postural dysfunction, obesity, and pain.   ACTIVITY LIMITATIONS carrying, lifting, bending, sitting, standing,  squatting, sleeping, stairs, transfers, bed mobility, dressing, and locomotion level  PARTICIPATION LIMITATIONS: cleaning, laundry, driving, shopping, community activity, occupation, and yard work  PERSONAL FACTORS Age, Education, and Past/current experiences are also affecting patient's functional outcome.   REHAB POTENTIAL: Good  CLINICAL DECISION MAKING: Evolving/moderate complexity  EVALUATION COMPLEXITY: Moderate   GOALS: Goals reviewed with patient? Yes  SHORT TERM GOALS:  Target date: 05/25/22  Pt will ambulate through the clinic with a symmetrical gait pattern, with equal step length and decreased pain for > 100 ft.  Baseline: Asymmetrical gait - increased L step length and decreased R step length Goal status: Partially met   LONG TERM GOALS: Target date: 06/22/22  Pt will increase FOTO score to > 58 Baseline: 9/7: 52 (no significant changes).  11/8: 49 (slight regression secondary to increase L anterior hip pain with stair climbing/ increase distance walked. Goal status: Not met  2.  Pt will decrease pain level to < 2/10 during all ADLs, in order to efficiently accomplish activities with decreased pain and increased independence Baseline:  Goal status: Partially met  3.  Pt will increase Active L knee flexion ROM to > 120 degrees  Baseline: 99, 106 passive.  8/8: 118 deg.  10/25: 118 deg.  Goal status: Partially met    PLAN: PT FREQUENCY: 1x/week  PT DURATION: 8 weeks  PLANNED INTERVENTIONS: Therapeutic exercises, Therapeutic activity, Neuromuscular re-education, Balance training, Gait training, Patient/Family education, Joint mobilization, Stair training, Aquatic Therapy, Cryotherapy, Moist heat, and Manual therapy.  PLAN FOR NEXT SESSION: Strengthening L LE and Hip flexion / hip abduction and stretch L iliopsoas. Gait training. Endurance emphasis.      Pura Spice, PT, DPT # [phone] 05/24/2022, 10:45 AM  [facility] [address] [place], [place], [address] Phone: [phone]   Fax:  [phone]

## 2022-05-30 ENCOUNTER — Other Ambulatory Visit: Payer: Self-pay | Admitting: Nurse Practitioner

## 2022-05-31 ENCOUNTER — Ambulatory Visit: Payer: BC Managed Care – PPO | Admitting: Physical Therapy

## 2022-05-31 NOTE — Telephone Encounter (Signed)
Please find out if patient has enough to get her to her appt.

## 2022-05-31 NOTE — Telephone Encounter (Signed)
Requested medication (s) are due for refill today - yes  Requested medication (s) are on the active medication list -yes  Future visit scheduled -yes  Last refill: 05/06/22 27ml  Notes to clinic: off protocol- provider review   Requested Prescriptions  Pending Prescriptions Disp Refills   MOUNJARO 2.5 MG/0.5ML Pen [Pharmacy Med Name: Mounjaro 2.5 MG/0.5ML Subcutaneous Solution Pen-injector] 4 mL 0    Sig: INJECT 2.5MG  SUBCUTANEOUSLY  ONCE A WEEK     Off-Protocol Failed - 05/30/2022  2:48 PM      Failed - Medication not assigned to a protocol, review manually.      Passed - Valid encounter within last 12 months    Recent Outpatient Visits           3 weeks ago Type 2 diabetes mellitus without complication, without long-term current use of insulin (HCC)   [facility] Goessel, Clydie Braun, NP   3 months ago Hyperlipidemia associated with type 2 diabetes mellitus (HCC)   [facility] Larae Grooms, NP   6 months ago Primary hypertension   [facility] Larae Grooms, NP   7 months ago Primary hypertension   [facility] Larae Grooms, NP   7 months ago Gastroenteritis   [facility], Oswaldo Conroy, PA-C       Future Appointments             In 1 week Larae Grooms, NP [facility]   In 3 months Larae Grooms, NP [facility]               Requested Prescriptions  Pending Prescriptions Disp Refills   MOUNJARO 2.5 MG/0.5ML Pen [Pharmacy Med Name: Mounjaro 2.5 MG/0.5ML Subcutaneous Solution Pen-injector] 4 mL 0    Sig: INJECT 2.5MG  SUBCUTANEOUSLY  ONCE A WEEK     Off-Protocol Failed - 05/30/2022  2:48 PM      Failed - Medication not assigned to a protocol, review manually.      Passed - Valid encounter within last 12 months    Recent Outpatient Visits           3 weeks ago Type 2 diabetes mellitus without complication, without long-term current use of  insulin (HCC)   [facility] Larae Grooms, NP   3 months ago Hyperlipidemia associated with type 2 diabetes mellitus (HCC)   [facility] Larae Grooms, NP   6 months ago Primary hypertension   [facility] Larae Grooms, NP   7 months ago Primary hypertension   [facility] Larae Grooms, NP   7 months ago Gastroenteritis   [facility] Mecum, Oswaldo Conroy, PA-C       Future Appointments             In 1 week Larae Grooms, NP [facility]   In 3 months Larae Grooms, NP [facility]

## 2022-06-01 NOTE — Telephone Encounter (Signed)
Called patient. She states that she does not have enough medicine to get to next weeks visit.

## 2022-06-06 ENCOUNTER — Telehealth: Payer: Self-pay | Admitting: Nurse Practitioner

## 2022-06-06 NOTE — Telephone Encounter (Signed)
PA for [facility] initiated and submitted via Cover My Meds. Key: [id number]   Called and notified patient that PA has been submitted and that we are awaiting determination from her insurance.

## 2022-06-06 NOTE — Telephone Encounter (Signed)
Copied from CRM [id number]. Topic: General - Inquiry >> Jun 03, 2022  3:47 PM Marlow Baars wrote: Reason for CRM: The patient called in checking on the status of her prio authorization to get her MOUNJARO 2.5 MG/0.5ML Pen refilled. Please assist patient further

## 2022-06-06 NOTE — Progress Notes (Unsigned)
LMP 08/20/2015 (Approximate) Comment: denies preg   Subjective:    Patient ID: Yesenia Stevens, female    DOB: 1965-12-26, 56 y.o.   MRN: [id number]  HPI: Yesenia Stevens is a 56 y.o. female  No chief complaint on file.  DIABETES Patient would like to start on Ozempic or Mounjaro for her diabetes.  She denies other concerns at visit today.  Currently taking Metformin, Glipizide, and Jardiance.  Denies HA, CP, SOB, dizziness, palpitations, visual changes, and lower extremity swelling.    Relevant past medical, surgical, family and social history reviewed and updated as indicated. Interim medical history since our last visit reviewed. Allergies and medications reviewed and updated.  Review of Systems  Eyes:  Negative for visual disturbance.  Respiratory:  Negative for cough, chest tightness and shortness of breath.   Cardiovascular:  Negative for chest pain, palpitations and leg swelling.  Neurological:  Negative for dizziness and headaches.    Per HPI unless specifically indicated above     Objective:    LMP 08/20/2015 (Approximate) Comment: denies preg  Wt Readings from Last 3 Encounters:  03/02/22 287 lb (130.2 kg)  12/17/21 285 lb (129.3 kg)  11/29/21 286 lb 9.6 oz (130 kg)    Physical Exam Vitals and nursing note reviewed.  HENT:     Head: Normocephalic.     Right Ear: Hearing normal.     Left Ear: Hearing normal.     Nose: Nose normal.  Eyes:     Pupils: Pupils are equal, round, and reactive to light.  Pulmonary:     Effort: Pulmonary effort is normal. No respiratory distress.  Neurological:     Mental Status: She is alert.  Psychiatric:        Mood and Affect: Mood normal.        Behavior: Behavior normal.        Thought Content: Thought content normal.        Judgment: Judgment normal.    Results for orders placed or performed in visit on 03/02/22  Comp Met (CMET)  Result Value Ref Range   Glucose 103 (H) 70 - 99 mg/dL   BUN 12 6 - 24 mg/dL    Creatinine, Ser 0.81 0.57 - 1.00 mg/dL   eGFR 86 >59 mL/min/1.73   BUN/Creatinine Ratio 15 9 - 23   Sodium 142 134 - 144 mmol/L   Potassium 4.3 3.5 - 5.2 mmol/L   Chloride 103 96 - 106 mmol/L   CO2 23 20 - 29 mmol/L   Calcium 9.4 8.7 - 10.2 mg/dL   Total Protein 7.1 6.0 - 8.5 g/dL   Albumin 3.8 3.8 - 4.9 g/dL   Globulin, Total 3.3 1.5 - 4.5 g/dL   Albumin/Globulin Ratio 1.2 1.2 - 2.2   Bilirubin Total 0.4 0.0 - 1.2 mg/dL   Alkaline Phosphatase 75 44 - 121 IU/L   AST 12 0 - 40 IU/L   ALT 8 0 - 32 IU/L  Lipid Profile  Result Value Ref Range   Cholesterol, Total 196 100 - 199 mg/dL   Triglycerides 104 0 - 149 mg/dL   HDL 47 >39 mg/dL   VLDL Cholesterol Cal 19 5 - 40 mg/dL   LDL Chol Calc (NIH) 130 (H) 0 - 99 mg/dL   Chol/HDL Ratio 4.2 0.0 - 4.4 ratio  HgB A1c  Result Value Ref Range   Hgb A1c MFr Bld 6.8 (H) 4.8 - 5.6 %   Est. average glucose Bld gHb Est-mCnc 148 mg/dL  Hepatitis C Antibody  Result Value Ref Range   Hep C Virus Ab Non Reactive Non Reactive      Assessment & Plan:   Problem List Items Addressed This Visit   None    Follow up plan: No follow-ups on file.   This visit was completed via MyChart due to the restrictions of the COVID-19 pandemic. All issues as above were discussed and addressed. Physical exam was done as above through visual confirmation on MyChart. If it was felt that the patient should be evaluated in the office, they were directed there. The patient verbally consented to this visit. Location of the patient: Home Location of the provider: Office Those involved with this call:  Provider: Jon Billings, NP CMA: Valinda Hoar, [place] Desk/Registration: Lynnell Catalan This encounter was conducted via video.  I spent 30 dedicated to the care of this patient on the date of this encounter to include previsit review of symptoms, plan of care, medications, side effects and benefits, face to face time with the patient, and post visit ordering of  testing.

## 2022-06-07 ENCOUNTER — Encounter: Payer: Self-pay | Admitting: Nurse Practitioner

## 2022-06-07 ENCOUNTER — Ambulatory Visit: Payer: BC Managed Care – PPO | Admitting: Physical Therapy

## 2022-06-07 ENCOUNTER — Ambulatory Visit (INDEPENDENT_AMBULATORY_CARE_PROVIDER_SITE_OTHER): Payer: BC Managed Care – PPO | Admitting: Nurse Practitioner

## 2022-06-07 VITALS — BP 111/76 | HR 106 | Temp 98.7°F | Wt 287.7 lb

## 2022-06-07 DIAGNOSIS — S32425D Nondisplaced fracture of posterior wall of left acetabulum, subsequent encounter for fracture with routine healing: Secondary | ICD-10-CM

## 2022-06-07 DIAGNOSIS — E119 Type 2 diabetes mellitus without complications: Secondary | ICD-10-CM

## 2022-06-07 DIAGNOSIS — M25652 Stiffness of left hip, not elsewhere classified: Secondary | ICD-10-CM

## 2022-06-07 DIAGNOSIS — M5459 Other low back pain: Secondary | ICD-10-CM

## 2022-06-07 DIAGNOSIS — R269 Unspecified abnormalities of gait and mobility: Secondary | ICD-10-CM

## 2022-06-07 DIAGNOSIS — R2689 Other abnormalities of gait and mobility: Secondary | ICD-10-CM

## 2022-06-07 DIAGNOSIS — M25552 Pain in left hip: Secondary | ICD-10-CM

## 2022-06-07 DIAGNOSIS — M6281 Muscle weakness (generalized): Secondary | ICD-10-CM

## 2022-06-07 LAB — MICROALBUMIN, URINE WAIVED
Creatinine, Urine Waived: 50 mg/dL (ref 10–300)
Microalb, Ur Waived: 30 mg/L — ABNORMAL HIGH (ref 0–19)

## 2022-06-07 MED ORDER — TIRZEPATIDE 5 MG/0.5ML ~~LOC~~ SOAJ: 5.0000 mg | SUBCUTANEOUS | 0 refills | Status: DC

## 2022-06-07 NOTE — Therapy (Signed)
OUTPATIENT PHYSICAL THERAPY THORACOLUMBAR TREATMENT  Patient Name: Yesenia Stevens MRN: [id number] DOB:Sep 08, 1965, 56 y.o., female Today's Date: 06/07/2022   PT End of Session -     Visit Number 37   Number of Visits  41   Date for PT Re-Evaluation 06/22/22    PT Start Time 5170 to 1648 (42 minutes)    Equipment Utilized During Treatment No gait belt   Activity Tolerance Patient limited by pain;Patient limited by fatigue    Behavior During Therapy [facility] for tasks assessed/performed          Past Medical History:  Diagnosis Date   Diabetes mellitus without complication ([place])    Hypertension    Past Surgical History:  Procedure Laterality Date   CESAREAN SECTION     CHOLECYSTECTOMY     COLONOSCOPY WITH PROPOFOL N/A 12/10/2019   Procedure: COLONOSCOPY WITH PROPOFOL;  Surgeon: [name], MD;  Location: [facility] ENDOSCOPY;  Service: Gastroenterology;  Laterality: N/A;   COLONOSCOPY WITH PROPOFOL N/A 12/24/2019   Procedure: COLONOSCOPY WITH PROPOFOL;  Surgeon: [name], MD;  Location: [facility] ENDOSCOPY;  Service: Gastroenterology;  Laterality: N/A;   FRACTURE SURGERY     Patient Active Problem List   Diagnosis Date Noted   Hyperlipidemia associated with type 2 diabetes mellitus ([place]) 09/22/2021   Displaced fracture of base of neck of right femur, sequela 07/16/2021   Closed fracture of posterior wall of left acetabulum with routine healing 06/16/2021   Palpitations 03/19/2020   Diarrhea 03/19/2020   Premature atrial contractions 08/20/2018   Morbid (severe) obesity due to excess calories ([place]) 08/01/2018   Shortness of breath 07/26/2018   Chronic cough 01/74/9449   Uncomplicated type 2 diabetes mellitus ([place]) 03/22/2018   Hypertension 03/22/2018    PCP: Jon Billings, NP   REFERRING PROVIDER: Katherine Mantle, MD  REFERRING DIAG:  Diagnosis  S32.15XD (ICD-10-CM) - Type 2 fracture of sacrum, subsequent encounter for fracture with routine healing  S32.422D  (ICD-10-CM) - Displaced fracture of posterior wall of left acetabulum, subsequent encounter for fracture with routine healing    Rationale for Evaluation and Treatment Rehabilitation  THERAPY DIAG:  Stiffness of left hip joint  Other low back pain  Gait difficulty  Pain in left hip  Balance problems  Muscle weakness (generalized)  ONSET DATE: 05/28/21  SUBJECTIVE:                                                                                                                                                                                           SUBJECTIVE STATEMENT: 06/07/22:  Pt. Had f/u with MD with no changes in POC.  Pt. Requested referral  for 2nd opinion and planning to schedule with [facility].  Pt. Reports 3/10 L hip pain prior to tx. Session.  Pt. Has been back to gym a few times since last PT session.   PERTINENT HISTORY:  Hx of L hip dislocation, and screws placed in lower back after her injury. Pt left hospital in a walker, and utilized a WC originally during [facility]. Pt has decreased ankle DF in L ankle, and pain in her lateral foot / 5th metatarsal. Pt had L knee scope, and has tenderness/bruising in L LE along tibia.  PAIN:  Are you having pain? NPRS scale: 3/10 Pain location: L hip (anterior) Pain description: popping and dull/achy Aggravating factors: standing, bending forward (especially left) Relieving factors: sitting down, tylenol PRN   PRECAUTIONS: Anterior hip, Posterior hip, Knee, and Fall  WEIGHT BEARING RESTRICTIONS no  FALLS:  Has patient fallen in last 6 months? Yes. Number of falls 1  LIVING ENVIRONMENT: Lives with: lives with their family Lives in: House/apartment Stairs: yes - 2 to get into home with raining Has following equipment at home: Quad cane large base and Wheelchair (manual)  OCCUPATION:  Pt is currently on long term disability. Previously worked at [employer] prior to injury. Would like to get back to work if the company can  find a position that is more stationary.  PLOF: Independent  PATIENT GOALS Pt wants to alleviate pain, become more mobile, strengthen back musculature, strengthen LEs   OBJECTIVE:   DIAGNOSTIC FINDINGS:  LUMBAR SPINE - 2-3 VIEW   COMPARISON:  MRI 09/28/2009   FINDINGS: 4-5 mm retrolisthesis L5-S1, slightly progressive since prior examination, possibly degenerative in nature. Otherwise normal lumbar lordosis. No acute fracture of the lumbar spine. Vertebral body height is preserved. Intervertebral disc space narrowing and endplate remodeling at D2-2 and L5-S1 is in keeping with mild degenerative disc disease at these levels. Paraspinal soft tissues are unremarkable. Right sacroiliac arthrodesis has been performed with 2 partially threaded screws.   IMPRESSION: No acute fracture or traumatic listhesis.   Degenerative changes L4-S1 with 5 mm retrolisthesis L5-S1.     Electronically Signed   By: Fidela Salisbury M.D.   On: 09/17/2021 22:13  PATIENT SURVEYS:  FOTO 52 ; 61  SCREENING FOR RED FLAGS: Bowel or bladder incontinence: No Spinal tumors: No Cauda equina syndrome: No Compression fracture: No Abdominal aneurysm: No  COGNITION:  Overall cognitive status: Within functional limits for tasks assessed     SENSATION: Not tested - patient reports altered sensation in L LE/foot  POSTURE: rounded shoulders, forward head, and weight shift left  PALPATION: Moderate L medial lower leg tenderness with palpation  LUMBAR ROM:   Active  AROM  eval  Flexion 50% limited (compensation to R)  Extension 75% limited (pain)  Right lateral flexion   Left lateral flexion   Right rotation 50% limited  Left rotation 25% limited   (Blank rows = not tested)  LOWER EXTREMITY ROM:     PROM/AROM Right eval Left eval  Hip flexion WFL 119/ 90 deg.  Hip extension    Hip abduction [facility]  Hip adduction    Hip internal rotation    Hip external rotation    Knee flexion WFL  106/ 99 deg.  Knee extension    Ankle dorsiflexion WFL Limited by pain  Ankle plantarflexion WFL Limited by pain  Ankle inversion    Ankle eversion     (Blank rows = not tested)  LOWER EXTREMITY MMT:  MMT Right eval Left eval  Hip flexion 4/5 3/5  Hip extension    Hip abduction 4/5 3/5  Hip adduction 4/5 4/5  Hip internal rotation 5/5 unable  Hip external rotation 5/5 3/5  Knee flexion 5/5 3/5 (pain)  Knee extension 5/5 4/5  Ankle dorsiflexion 4+/5 3/5  Ankle plantarflexion 5/5 4/5  Ankle inversion    Ankle eversion     (Blank rows = not tested)  FUNCTIONAL TESTS:  5 times sit to stand: 25.4 seconds .  9/21:  14.06 seconds (marked improvement).    GAIT: Distance walked: in clinic Assistive device utilized: Quad cane small base Level of assistance: Modified independence Comments: R sided Trendelenburg.  L antalgic gait pattern with decrease stance on L with short R LE step length/ shuffling.  No arm swing. Walks with wide base of support, with mild supination of B feet. Pt had limited endurance, and significantly limited L hip flexion and DF to clear her foot during swing phase.     01/25/22: 5xSTS: 15.94 sec./ 14.94 sec.  (Marked improvement).      9/21:  5xSTS: 14.06 seconds (marked improvement).      11/8: 5xSTS: 12.48 seconds  (improvement).     Treatment:  06/07/22:     There.ex.:       Nustep L5 10 min. B UE/LE at seat position 9 (consistent cadence)- 0.50 miles.  Discussed MD appt./ gym based ex./ upcoming holiday weekend.                Walking in hallway (3 laps)/ //-bars forward/backwards/lateral walking in //-bars with cuing to correct upright posture 3 laps.  L hip pain remained 3-4/10 with walking.     Walking 6" hurdles with step overs 2x in //-bars Personal assistant).      Step ups/downs in //-bars on 6" step (B UE assist required for safety).    No supine ex. Today.     Discussed return to aquatic ex. Classes/ HEP.    PATIENT EDUCATION:   Education details: Access Code: [email or web address] Person educated: Patient Education method: Explanation, Demonstration, and Handouts Education comprehension: verbalized understanding and returned demonstration   HOME EXERCISE PROGRAM: Access Code: [email or web address] URL: [email or web address] Date: 11/25/2021 Prepared by: Dorcas Carrow  Exercises - Straight Leg Raise  - 1 x daily - 7 x weekly - 2 sets - 10 reps - Supine Heel Slide  - 1 x daily - 7 x weekly - 2 sets - 10 reps - Supine Hip Adduction Isometric with Ball  - 1 x daily - 7 x weekly - 2 sets - 10 reps - Hooklying Gluteal Sets  - 1 x daily - 7 x weekly - 2 sets - 10 reps - Seated Heel Raise  - 1 x daily - 7 x weekly - 2 sets - 10 reps - Seated Heel Toe Raises  - 1 x daily - 7 x weekly - 2 sets - 10 reps  ASSESSMENT:  CLINICAL IMPRESSION: Pt. Had less overall L hip pain during tx. Session with walking/ sit to stands.  Pt. Continues to benefit from [facility] for safety with gait but able to ambulate short distances without SPC but moderate increase in antalgic gait pattern.  Pt. Will continue to benefit from transition from PT to more gym based/ aquatic ex.  Pt will benefit from further skilled PT in order to strengthen her LE's, and to progress her gait mechanics.   OBJECTIVE IMPAIRMENTS Abnormal gait, decreased activity tolerance, decreased balance, decreased endurance, decreased  mobility, difficulty walking, decreased ROM, decreased strength, hypomobility, impaired flexibility, impaired sensation, improper body mechanics, postural dysfunction, obesity, and pain.   ACTIVITY LIMITATIONS carrying, lifting, bending, sitting, standing, squatting, sleeping, stairs, transfers, bed mobility, dressing, and locomotion level  PARTICIPATION LIMITATIONS: cleaning, laundry, driving, shopping, community activity, occupation, and yard work  PERSONAL FACTORS Age, Education, and Past/current experiences are also affecting patient's functional outcome.    REHAB POTENTIAL: Good  CLINICAL DECISION MAKING: Evolving/moderate complexity  EVALUATION COMPLEXITY: Moderate   GOALS: Goals reviewed with patient? Yes  SHORT TERM GOALS: Target date: 05/25/22  Pt will ambulate through the clinic with a symmetrical gait pattern, with equal step length and decreased pain for > 100 ft.  Baseline: Asymmetrical gait - increased L step length and decreased R step length Goal status: Partially met   LONG TERM GOALS: Target date: 06/22/22  Pt will increase FOTO score to > 58 Baseline: 9/7: 52 (no significant changes).  11/8: 49 (slight regression secondary to increase L anterior hip pain with stair climbing/ increase distance walked. Goal status: Not met  2.  Pt will decrease pain level to < 2/10 during all ADLs, in order to efficiently accomplish activities with decreased pain and increased independence Baseline:  Goal status: Partially met  3.  Pt will increase Active L knee flexion ROM to > 120 degrees  Baseline: 99, 106 passive.  8/8: 118 deg.  10/25: 118 deg.  Goal status: Partially met    PLAN: PT FREQUENCY: 1x/week  PT DURATION: 8 weeks  PLANNED INTERVENTIONS: Therapeutic exercises, Therapeutic activity, Neuromuscular re-education, Balance training, Gait training, Patient/Family education, Joint mobilization, Stair training, Aquatic Therapy, Cryotherapy, Moist heat, and Manual therapy.  PLAN FOR NEXT SESSION: Strengthening L LE and Hip flexion / hip abduction and stretch L iliopsoas. Gait training. Endurance emphasis.      Pura Spice, PT, DPT # [phone] 06/07/2022, 4:07 PM  [facility] [address] [place], [place], [address] Phone: [phone]   Fax:  [phone]

## 2022-06-07 NOTE — Assessment & Plan Note (Signed)
Referral placed for patient to have second opinion.  

## 2022-06-07 NOTE — Assessment & Plan Note (Signed)
Chronic. Yesenia Stevens is now approved. Will send in 5mg  dose for patient to start after the first 4 weeks.  Follow up in 2 months.  Call sooner if concerns arise.

## 2022-06-07 NOTE — Progress Notes (Signed)
Please let patient know that her microalbumin shows that her kidneys are dumping some protein.  However, I think the mounjaro will help with this.

## 2022-06-07 NOTE — Telephone Encounter (Signed)
PA was approved and patient was notified at today's appointment.

## 2022-06-16 ENCOUNTER — Ambulatory Visit: Payer: BC Managed Care – PPO | Admitting: Physical Therapy

## 2022-06-16 ENCOUNTER — Encounter: Payer: Self-pay | Admitting: Physical Therapy

## 2022-06-16 DIAGNOSIS — R269 Unspecified abnormalities of gait and mobility: Secondary | ICD-10-CM

## 2022-06-16 DIAGNOSIS — M25552 Pain in left hip: Secondary | ICD-10-CM

## 2022-06-16 DIAGNOSIS — M5459 Other low back pain: Secondary | ICD-10-CM

## 2022-06-16 DIAGNOSIS — M25652 Stiffness of left hip, not elsewhere classified: Secondary | ICD-10-CM | POA: Diagnosis not present

## 2022-06-16 DIAGNOSIS — R2689 Other abnormalities of gait and mobility: Secondary | ICD-10-CM

## 2022-06-16 DIAGNOSIS — M6281 Muscle weakness (generalized): Secondary | ICD-10-CM

## 2022-06-16 NOTE — Therapy (Signed)
OUTPATIENT PHYSICAL THERAPY THORACOLUMBAR TREATMENT  Patient Name: Yesenia Stevens MRN: [id number] DOB:06/11/1966, 56 y.o., female Today's Date: 06/17/2022   PT End of Session -     Visit Number 38   Number of Visits  41   Date for PT Re-Evaluation 06/22/22    PT Start Time 1511 to 1540 (29 minutes)    Equipment Utilized During Treatment No gait belt   Activity Tolerance Patient limited by pain;Patient limited by fatigue    Behavior During Therapy [facility] for tasks assessed/performed          Past Medical History:  Diagnosis Date   Diabetes mellitus without complication ([place])    Hypertension    Past Surgical History:  Procedure Laterality Date   CESAREAN SECTION     CHOLECYSTECTOMY     COLONOSCOPY WITH PROPOFOL N/A 12/10/2019   Procedure: COLONOSCOPY WITH PROPOFOL;  Surgeon: [name], MD;  Location: [facility] ENDOSCOPY;  Service: Gastroenterology;  Laterality: N/A;   COLONOSCOPY WITH PROPOFOL N/A 12/24/2019   Procedure: COLONOSCOPY WITH PROPOFOL;  Surgeon: [name], MD;  Location: [facility] ENDOSCOPY;  Service: Gastroenterology;  Laterality: N/A;   FRACTURE SURGERY     Patient Active Problem List   Diagnosis Date Noted   Hyperlipidemia associated with type 2 diabetes mellitus ([place]) 09/22/2021   Displaced fracture of base of neck of right femur, sequela 07/16/2021   Closed fracture of posterior wall of left acetabulum with routine healing 06/16/2021   Palpitations 03/19/2020   Diarrhea 03/19/2020   Premature atrial contractions 08/20/2018   Morbid (severe) obesity due to excess calories ([place]) 08/01/2018   Shortness of breath 07/26/2018   Chronic cough 86/76/7209   Uncomplicated type 2 diabetes mellitus ([place]) 03/22/2018   Hypertension 03/22/2018    PCP: Jon Billings, NP   REFERRING PROVIDER: Katherine Mantle, MD  REFERRING DIAG:  Diagnosis  S32.15XD (ICD-10-CM) - Type 2 fracture of sacrum, subsequent encounter for fracture with routine healing  S32.422D  (ICD-10-CM) - Displaced fracture of posterior wall of left acetabulum, subsequent encounter for fracture with routine healing    Rationale for Evaluation and Treatment Rehabilitation  THERAPY DIAG:  Stiffness of left hip joint  Other low back pain  Gait difficulty  Pain in left hip  Balance problems  Muscle weakness (generalized)  ONSET DATE: 05/28/21  SUBJECTIVE:                                                                                                                                                                                           SUBJECTIVE STATEMENT: 06/16/22:  Pt. Had f/u with PA-C for a 2nd opinion with no changes in POC.  Pt. Reports 4-5/10 L hip pain prior to tx. Session.  Pt. Has been back to gym a few times since last PT session.  Pt. Brought in a copy of x-ray of L hip which shows hip OA and limited spacing in hip joint.    PERTINENT HISTORY:  Hx of L hip dislocation, and screws placed in lower back after her injury. Pt left hospital in a walker, and utilized a WC originally during [facility]. Pt has decreased ankle DF in L ankle, and pain in her lateral foot / 5th metatarsal. Pt had L knee scope, and has tenderness/bruising in L LE along tibia.  PAIN:  Are you having pain? NPRS scale: 4-5/10 Pain location: L hip (anterior) Pain description: popping and dull/achy Aggravating factors: standing, bending forward (especially left) Relieving factors: sitting down, tylenol PRN   PRECAUTIONS: Anterior hip, Posterior hip, Knee, and Fall  WEIGHT BEARING RESTRICTIONS no  FALLS:  Has patient fallen in last 6 months? Yes. Number of falls 1  LIVING ENVIRONMENT: Lives with: lives with their family Lives in: House/apartment Stairs: yes - 2 to get into home with raining Has following equipment at home: Quad cane large base and Wheelchair (manual)  OCCUPATION:  Pt is currently on long term disability. Previously worked at [employer] prior to injury. Would like to  get back to work if the company can find a position that is more stationary.  PLOF: Independent  PATIENT GOALS Pt wants to alleviate pain, become more mobile, strengthen back musculature, strengthen LEs   OBJECTIVE:   DIAGNOSTIC FINDINGS:  LUMBAR SPINE - 2-3 VIEW   COMPARISON:  MRI 09/28/2009   FINDINGS: 4-5 mm retrolisthesis L5-S1, slightly progressive since prior examination, possibly degenerative in nature. Otherwise normal lumbar lordosis. No acute fracture of the lumbar spine. Vertebral body height is preserved. Intervertebral disc space narrowing and endplate remodeling at M6-0 and L5-S1 is in keeping with mild degenerative disc disease at these levels. Paraspinal soft tissues are unremarkable. Right sacroiliac arthrodesis has been performed with 2 partially threaded screws.   IMPRESSION: No acute fracture or traumatic listhesis.   Degenerative changes L4-S1 with 5 mm retrolisthesis L5-S1.     Electronically Signed   By: Fidela Salisbury M.D.   On: 09/17/2021 22:13  PATIENT SURVEYS:  FOTO 52 ; 61  SCREENING FOR RED FLAGS: Bowel or bladder incontinence: No Spinal tumors: No Cauda equina syndrome: No Compression fracture: No Abdominal aneurysm: No  COGNITION:  Overall cognitive status: Within functional limits for tasks assessed     SENSATION: Not tested - patient reports altered sensation in L LE/foot  POSTURE: rounded shoulders, forward head, and weight shift left  PALPATION: Moderate L medial lower leg tenderness with palpation  LUMBAR ROM:   Active  AROM  eval  Flexion 50% limited (compensation to R)  Extension 75% limited (pain)  Right lateral flexion   Left lateral flexion   Right rotation 50% limited  Left rotation 25% limited   (Blank rows = not tested)  LOWER EXTREMITY ROM:     PROM/AROM Right eval Left eval  Hip flexion WFL 119/ 90 deg.  Hip extension    Hip abduction [facility]  Hip adduction    Hip internal rotation    Hip  external rotation    Knee flexion WFL 106/ 99 deg.  Knee extension    Ankle dorsiflexion WFL Limited by pain  Ankle plantarflexion WFL Limited by pain  Ankle inversion    Ankle eversion     (  Blank rows = not tested)  LOWER EXTREMITY MMT:    MMT Right eval Left eval  Hip flexion 4/5 3/5  Hip extension    Hip abduction 4/5 3/5  Hip adduction 4/5 4/5  Hip internal rotation 5/5 unable  Hip external rotation 5/5 3/5  Knee flexion 5/5 3/5 (pain)  Knee extension 5/5 4/5  Ankle dorsiflexion 4+/5 3/5  Ankle plantarflexion 5/5 4/5  Ankle inversion    Ankle eversion     (Blank rows = not tested)  FUNCTIONAL TESTS:  5 times sit to stand: 25.4 seconds .  9/21:  14.06 seconds (marked improvement).    GAIT: Distance walked: in clinic Assistive device utilized: Quad cane small base Level of assistance: Modified independence Comments: R sided Trendelenburg.  L antalgic gait pattern with decrease stance on L with short R LE step length/ shuffling.  No arm swing. Walks with wide base of support, with mild supination of B feet. Pt had limited endurance, and significantly limited L hip flexion and DF to clear her foot during swing phase.     01/25/22: 5xSTS: 15.94 sec./ 14.94 sec.  (Marked improvement).      9/21:  5xSTS: 14.06 seconds (marked improvement).      11/8: 5xSTS: 12.48 seconds  (improvement).     Treatment:  06/16/22:     There.ex.:       Nustep L5 3.5 min. B UE/LE at seat position 9 (consistent cadence)- 0.1 miles.  Pt. Limited by marked increase in L hip pain today.  PT stopped Nustep to reassess L hip ROM/ walking.                 Walking in clinic/ hallway (1 lap) with marked L antalgic gait pattern with use of SPC.  L hip pain remained high with standing/ walking.     Reassessment of L hip ROM/ strength on blue mat table.       Discussed importance of aquatic ex. Due to marked increase in L hip pain.  PT discussed/ answered questions about hip replacement.      PATIENT EDUCATION:  Education details: Access Code: [email or web address] Person educated: Patient Education method: Explanation, Demonstration, and Handouts Education comprehension: verbalized understanding and returned demonstration   HOME EXERCISE PROGRAM: Access Code: [email or web address] URL: [email or web address] Date: 11/25/2021 Prepared by: Dorcas Carrow  Exercises - Straight Leg Raise  - 1 x daily - 7 x weekly - 2 sets - 10 reps - Supine Heel Slide  - 1 x daily - 7 x weekly - 2 sets - 10 reps - Supine Hip Adduction Isometric with Ball  - 1 x daily - 7 x weekly - 2 sets - 10 reps - Hooklying Gluteal Sets  - 1 x daily - 7 x weekly - 2 sets - 10 reps - Seated Heel Raise  - 1 x daily - 7 x weekly - 2 sets - 10 reps - Seated Heel Toe Raises  - 1 x daily - 7 x weekly - 2 sets - 10 reps  ASSESSMENT:  CLINICAL IMPRESSION: PT tx. Limited today but marked increase in L hip pain with Nustep/ standing ex./ walking.  Moderate L antalgic gait pattern with wt. Bearing and pt. Had difficulty sitting on blue mat table for hip ROM/ strength reassessment.  Pt. Instructed to return to aquatic ex. And take meds as prescribed to manage symptoms.  Pt. Will continue to benefit from transition from PT to more gym based/ aquatic ex.  Pt will benefit from further  skilled PT in order to strengthen her LE's, and to progress her gait mechanics.   OBJECTIVE IMPAIRMENTS Abnormal gait, decreased activity tolerance, decreased balance, decreased endurance, decreased mobility, difficulty walking, decreased ROM, decreased strength, hypomobility, impaired flexibility, impaired sensation, improper body mechanics, postural dysfunction, obesity, and pain.   ACTIVITY LIMITATIONS carrying, lifting, bending, sitting, standing, squatting, sleeping, stairs, transfers, bed mobility, dressing, and locomotion level  PARTICIPATION LIMITATIONS: cleaning, laundry, driving, shopping, community activity, occupation, and yard  work  PERSONAL FACTORS Age, Education, and Past/current experiences are also affecting patient's functional outcome.   REHAB POTENTIAL: Good  CLINICAL DECISION MAKING: Evolving/moderate complexity  EVALUATION COMPLEXITY: Moderate   GOALS: Goals reviewed with patient? Yes  SHORT TERM GOALS: Target date: 05/25/22  Pt will ambulate through the clinic with a symmetrical gait pattern, with equal step length and decreased pain for > 100 ft.  Baseline: Asymmetrical gait - increased L step length and decreased R step length Goal status: Partially met   LONG TERM GOALS: Target date: 06/22/22  Pt will increase FOTO score to > 58 Baseline: 9/7: 52 (no significant changes).  11/8: 49 (slight regression secondary to increase L anterior hip pain with stair climbing/ increase distance walked. Goal status: Not met  2.  Pt will decrease pain level to < 2/10 during all ADLs, in order to efficiently accomplish activities with decreased pain and increased independence Baseline:  Goal status: Partially met  3.  Pt will increase Active L knee flexion ROM to > 120 degrees  Baseline: 99, 106 passive.  8/8: 118 deg.  10/25: 118 deg.  Goal status: Partially met    PLAN: PT FREQUENCY: 1x/week  PT DURATION: 8 weeks  PLANNED INTERVENTIONS: Therapeutic exercises, Therapeutic activity, Neuromuscular re-education, Balance training, Gait training, Patient/Family education, Joint mobilization, Stair training, Aquatic Therapy, Cryotherapy, Moist heat, and Manual therapy.  PLAN FOR NEXT SESSION:  Check goals next tx. Session.     Pura Spice, PT, DPT # [phone] 06/17/2022, 10:59 AM  [facility] [address] [place], [place], [address] Phone: [phone]   Fax:  [phone]

## 2022-06-21 ENCOUNTER — Ambulatory Visit: Payer: Medicaid Other | Attending: Trauma Surgery | Admitting: Physical Therapy

## 2022-06-21 DIAGNOSIS — M6281 Muscle weakness (generalized): Secondary | ICD-10-CM | POA: Insufficient documentation

## 2022-06-21 DIAGNOSIS — R269 Unspecified abnormalities of gait and mobility: Secondary | ICD-10-CM | POA: Insufficient documentation

## 2022-06-21 DIAGNOSIS — M25552 Pain in left hip: Secondary | ICD-10-CM | POA: Insufficient documentation

## 2022-06-21 DIAGNOSIS — M25652 Stiffness of left hip, not elsewhere classified: Secondary | ICD-10-CM | POA: Insufficient documentation

## 2022-06-21 DIAGNOSIS — R29898 Other symptoms and signs involving the musculoskeletal system: Secondary | ICD-10-CM | POA: Insufficient documentation

## 2022-06-21 DIAGNOSIS — R2689 Other abnormalities of gait and mobility: Secondary | ICD-10-CM | POA: Insufficient documentation

## 2022-06-21 DIAGNOSIS — M5459 Other low back pain: Secondary | ICD-10-CM | POA: Insufficient documentation

## 2022-06-24 ENCOUNTER — Ambulatory Visit: Payer: Medicaid Other | Admitting: Physical Therapy

## 2022-06-24 DIAGNOSIS — M25652 Stiffness of left hip, not elsewhere classified: Secondary | ICD-10-CM | POA: Diagnosis not present

## 2022-06-24 DIAGNOSIS — R2689 Other abnormalities of gait and mobility: Secondary | ICD-10-CM | POA: Diagnosis not present

## 2022-06-24 DIAGNOSIS — M6281 Muscle weakness (generalized): Secondary | ICD-10-CM | POA: Diagnosis not present

## 2022-06-24 DIAGNOSIS — R29898 Other symptoms and signs involving the musculoskeletal system: Secondary | ICD-10-CM | POA: Diagnosis not present

## 2022-06-24 DIAGNOSIS — R269 Unspecified abnormalities of gait and mobility: Secondary | ICD-10-CM

## 2022-06-24 DIAGNOSIS — M25552 Pain in left hip: Secondary | ICD-10-CM | POA: Diagnosis present

## 2022-06-24 DIAGNOSIS — M5459 Other low back pain: Secondary | ICD-10-CM | POA: Diagnosis not present

## 2022-06-24 NOTE — Therapy (Signed)
OUTPATIENT PHYSICAL THERAPY THORACOLUMBAR TREATMENT/RECERTIFICATION  Patient Name: Yesenia Stevens MRN: [id number] DOB:09-23-1965, 57 y.o., female Today's Date: 06/24/2022   PT End of Session -     Visit Number 39   Number of Visits  47   Date for PT Re-Evaluation 08/19/22    PT Start Time 0946 to 1032 (46 minutes)    Equipment Utilized During Treatment No gait belt   Activity Tolerance Patient limited by pain;Patient limited by fatigue    Behavior During Therapy [facility] for tasks assessed/performed          Past Medical History:  Diagnosis Date   Diabetes mellitus without complication ([place])    Hypertension    Past Surgical History:  Procedure Laterality Date   CESAREAN SECTION     CHOLECYSTECTOMY     COLONOSCOPY WITH PROPOFOL N/A 12/10/2019   Procedure: COLONOSCOPY WITH PROPOFOL;  Surgeon: [name], MD;  Location: [facility] ENDOSCOPY;  Service: Gastroenterology;  Laterality: N/A;   COLONOSCOPY WITH PROPOFOL N/A 12/24/2019   Procedure: COLONOSCOPY WITH PROPOFOL;  Surgeon: [name], MD;  Location: [facility] ENDOSCOPY;  Service: Gastroenterology;  Laterality: N/A;   FRACTURE SURGERY     Patient Active Problem List   Diagnosis Date Noted   Hyperlipidemia associated with type 2 diabetes mellitus ([place]) 09/22/2021   Displaced fracture of base of neck of right femur, sequela 07/16/2021   Closed fracture of posterior wall of left acetabulum with routine healing 06/16/2021   Palpitations 03/19/2020   Diarrhea 03/19/2020   Premature atrial contractions 08/20/2018   Morbid (severe) obesity due to excess calories ([place]) 08/01/2018   Shortness of breath 07/26/2018   Chronic cough 23/76/2831   Uncomplicated type 2 diabetes mellitus ([place]) 03/22/2018   Hypertension 03/22/2018    PCP: Jon Billings, NP   REFERRING PROVIDER: Katherine Mantle, MD  REFERRING DIAG:  Diagnosis  S32.15XD (ICD-10-CM) - Type 2 fracture of sacrum, subsequent encounter for fracture with routine healing   S32.422D (ICD-10-CM) - Displaced fracture of posterior wall of left acetabulum, subsequent encounter for fracture with routine healing    Rationale for Evaluation and Treatment Rehabilitation  THERAPY DIAG:  Stiffness of left hip joint  Other low back pain  Gait difficulty  Pain in left hip  Balance problems  Muscle weakness (generalized)  ONSET DATE: 05/28/21  SUBJECTIVE:                                                                                                                                                                                           SUBJECTIVE STATEMENT: 06/24/22:  Pt. Has not been back to gym since last PT appt.  Pt. Jama Flavors  into PT with marked improvement.  Pt. Reports less R hip pain as compared to last PT tx. Session.      PERTINENT HISTORY:  Hx of L hip dislocation, and screws placed in lower back after her injury. Pt left hospital in a walker, and utilized a WC originally during [facility]. Pt has decreased ankle DF in L ankle, and pain in her lateral foot / 5th metatarsal. Pt had L knee scope, and has tenderness/bruising in L LE along tibia.  PAIN:  Are you having pain? NPRS scale: 4-5/10 Pain location: L hip (anterior) Pain description: popping and dull/achy Aggravating factors: standing, bending forward (especially left) Relieving factors: sitting down, tylenol PRN   PRECAUTIONS: Anterior hip, Posterior hip, Knee, and Fall  WEIGHT BEARING RESTRICTIONS no  FALLS:  Has patient fallen in last 6 months? Yes. Number of falls 1  LIVING ENVIRONMENT: Lives with: lives with their family Lives in: House/apartment Stairs: yes - 2 to get into home with raining Has following equipment at home: Quad cane large base and Wheelchair (manual)  OCCUPATION:  Pt is currently on long term disability. Previously worked at [employer] prior to injury. Would like to get back to work if the company can find a position that is more stationary.  PLOF:  Independent  PATIENT GOALS Pt wants to alleviate pain, become more mobile, strengthen back musculature, strengthen LEs   OBJECTIVE:   DIAGNOSTIC FINDINGS:  LUMBAR SPINE - 2-3 VIEW   COMPARISON:  MRI 09/28/2009   FINDINGS: 4-5 mm retrolisthesis L5-S1, slightly progressive since prior examination, possibly degenerative in nature. Otherwise normal lumbar lordosis. No acute fracture of the lumbar spine. Vertebral body height is preserved. Intervertebral disc space narrowing and endplate remodeling at Q6-5 and L5-S1 is in keeping with mild degenerative disc disease at these levels. Paraspinal soft tissues are unremarkable. Right sacroiliac arthrodesis has been performed with 2 partially threaded screws.   IMPRESSION: No acute fracture or traumatic listhesis.   Degenerative changes L4-S1 with 5 mm retrolisthesis L5-S1.     Electronically Signed   By: Fidela Salisbury M.D.   On: 09/17/2021 22:13  PATIENT SURVEYS:  FOTO 52 ; 61  SCREENING FOR RED FLAGS: Bowel or bladder incontinence: No Spinal tumors: No Cauda equina syndrome: No Compression fracture: No Abdominal aneurysm: No  COGNITION:  Overall cognitive status: Within functional limits for tasks assessed     SENSATION: Not tested - patient reports altered sensation in L LE/foot  POSTURE: rounded shoulders, forward head, and weight shift left  PALPATION: Moderate L medial lower leg tenderness with palpation  LUMBAR ROM:   Active  AROM  eval  Flexion 50% limited (compensation to R)  Extension 75% limited (pain)  Right lateral flexion   Left lateral flexion   Right rotation 50% limited  Left rotation 25% limited   (Blank rows = not tested)  LOWER EXTREMITY ROM:     PROM/AROM Right eval Left eval  Hip flexion WFL 119/ 90 deg.  Hip extension    Hip abduction [facility]  Hip adduction    Hip internal rotation    Hip external rotation    Knee flexion WFL 106/ 99 deg.  Knee extension    Ankle dorsiflexion  WFL Limited by pain  Ankle plantarflexion WFL Limited by pain  Ankle inversion    Ankle eversion     (Blank rows = not tested)  LOWER EXTREMITY MMT:    MMT Right eval Left eval  Hip flexion 4/5 3/5  Hip  extension    Hip abduction 4/5 3/5  Hip adduction 4/5 4/5  Hip internal rotation 5/5 unable  Hip external rotation 5/5 3/5  Knee flexion 5/5 3/5 (pain)  Knee extension 5/5 4/5  Ankle dorsiflexion 4+/5 3/5  Ankle plantarflexion 5/5 4/5  Ankle inversion    Ankle eversion     (Blank rows = not tested)  FUNCTIONAL TESTS:  5 times sit to stand: 25.4 seconds .  9/21:  14.06 seconds (marked improvement).    GAIT: Distance walked: in clinic Assistive device utilized: Quad cane small base Level of assistance: Modified independence Comments: R sided Trendelenburg.  L antalgic gait pattern with decrease stance on L with short R LE step length/ shuffling.  No arm swing. Walks with wide base of support, with mild supination of B feet. Pt had limited endurance, and significantly limited L hip flexion and DF to clear her foot during swing phase.     01/25/22: 5xSTS: 15.94 sec./ 14.94 sec.  (Marked improvement).      9/21:  5xSTS: 14.06 seconds (marked improvement).      11/8: 5xSTS: 12.48 seconds  (improvement).      06/24/22: 9.89 seconds (marked improvement).     Treatment:  06/24/22:     There.ex.:       Nustep L4 11 min. B UE/LE at seat position 10 (consistent cadence).  STS: 12.37 sec./ 9.89 sec.    Step touches with light UE assist/ recip. Stairs with UE assist required, esp. While descending.     Lateral walking L/R/ high marching/ backwards walking in //-bars (mirror feedback)- minimal cuing to correct head position/ posture.                Walking in clinic/ hallway without use of SPC and less overall L hip pain as compared to last week.       Discussed importance of completing a more consistent aquatic ex. program Due to marked increase in L hip pain.  PT discussed/  answered questions about hip replacement.     PATIENT EDUCATION:  Education details: Access Code: [phone] Person educated: Patient Education method: Explanation, Demonstration, and Handouts Education comprehension: verbalized understanding and returned demonstration   HOME EXERCISE PROGRAM: Access Code: [email or web address] URL: [email or web address] Date: 11/25/2021 Prepared by: Dorene Grebe  Exercises - Straight Leg Raise  - 1 x daily - 7 x weekly - 2 sets - 10 reps - Supine Heel Slide  - 1 x daily - 7 x weekly - 2 sets - 10 reps - Supine Hip Adduction Isometric with Ball  - 1 x daily - 7 x weekly - 2 sets - 10 reps - Hooklying Gluteal Sets  - 1 x daily - 7 x weekly - 2 sets - 10 reps - Seated Heel Raise  - 1 x daily - 7 x weekly - 2 sets - 10 reps - Seated Heel Toe Raises  - 1 x daily - 7 x weekly - 2 sets - 10 reps  ASSESSMENT:  CLINICAL IMPRESSION: Pt. Worked hard during tx. Session today with standing/ walking tasks.   Moderate L antalgic gait pattern with increase standing/ wt. Bearing.  No supine stretches/ there.ex. today.  Pt. Instructed to return to aquatic ex. And take meds as prescribed to manage symptoms.  See updated goals.  Pt. Will continue to benefit from transition from PT to more gym based/ aquatic ex.  Pt will benefit from further skilled PT in order to strengthen her LE's, and to progress her gait  mechanics.   OBJECTIVE IMPAIRMENTS Abnormal gait, decreased activity tolerance, decreased balance, decreased endurance, decreased mobility, difficulty walking, decreased ROM, decreased strength, hypomobility, impaired flexibility, impaired sensation, improper body mechanics, postural dysfunction, obesity, and pain.   ACTIVITY LIMITATIONS carrying, lifting, bending, sitting, standing, squatting, sleeping, stairs, transfers, bed mobility, dressing, and locomotion level  PARTICIPATION LIMITATIONS: cleaning, laundry, driving, shopping, community activity, occupation,  and yard work  PERSONAL FACTORS Age, Education, and Past/current experiences are also affecting patient's functional outcome.   REHAB POTENTIAL: Good  CLINICAL DECISION MAKING: Evolving/moderate complexity  EVALUATION COMPLEXITY: Moderate   GOALS: Goals reviewed with patient? Yes  SHORT TERM GOALS: Target date: 07/22/22  Pt will ambulate through the clinic with a symmetrical gait pattern, with equal step length and decreased pain for > 100 ft.  Baseline: Asymmetrical gait - increased L step length and decreased R step length Goal status: Partially met   LONG TERM GOALS: Target date: 08/19/22  Pt will increase FOTO score to > 58 Baseline: 9/7: 52 (no significant changes).  11/8: 49 (slight regression secondary to increase L anterior hip pain with stair climbing/ increase distance walked. Goal status: Not met  2.  Pt will decrease pain level to < 2/10 during all ADLs, in order to efficiently accomplish activities with decreased pain and increased independence Baseline:  Goal status: Partially met  3.  Pt will increase Active L knee flexion ROM to > 120 degrees  Baseline: 99, 106 passive.  8/8: 118 deg.  10/25: 118 deg.  Goal status: Partially met    PLAN: PT FREQUENCY: 1x/week  PT DURATION: 8 weeks  PLANNED INTERVENTIONS: Therapeutic exercises, Therapeutic activity, Neuromuscular re-education, Balance training, Gait training, Patient/Family education, Joint mobilization, Stair training, Aquatic Therapy, Cryotherapy, Moist heat, and Manual therapy.  PLAN FOR NEXT SESSION:  Issue new HEP   Cammie Mcgee, PT, DPT # [phone] 06/24/2022, 9:57 AM  [facility] [address] [place], [place], [address] Phone: [phone]   Fax:  [phone]

## 2022-06-27 ENCOUNTER — Ambulatory Visit: Payer: Medicaid Other | Admitting: Physical Therapy

## 2022-06-27 ENCOUNTER — Encounter: Payer: Self-pay | Admitting: Physical Therapy

## 2022-06-27 DIAGNOSIS — M25652 Stiffness of left hip, not elsewhere classified: Secondary | ICD-10-CM | POA: Diagnosis not present

## 2022-06-27 DIAGNOSIS — R29898 Other symptoms and signs involving the musculoskeletal system: Secondary | ICD-10-CM | POA: Diagnosis not present

## 2022-06-27 DIAGNOSIS — R269 Unspecified abnormalities of gait and mobility: Secondary | ICD-10-CM

## 2022-06-27 DIAGNOSIS — R2689 Other abnormalities of gait and mobility: Secondary | ICD-10-CM

## 2022-06-27 DIAGNOSIS — M5459 Other low back pain: Secondary | ICD-10-CM | POA: Diagnosis not present

## 2022-06-27 DIAGNOSIS — M25552 Pain in left hip: Secondary | ICD-10-CM

## 2022-06-27 DIAGNOSIS — M6281 Muscle weakness (generalized): Secondary | ICD-10-CM

## 2022-06-27 NOTE — Therapy (Signed)
OUTPATIENT PHYSICAL THERAPY THORACOLUMBAR TREATMENT Physical Therapy Progress Note  Dates of reporting period  04/15/22  to  06/27/22  Patient Name: Yesenia Stevens MRN: [id number] DOB:1965/07/05, 57 y.o., female Today's Date: 06/27/2022   PT End of Session -     Visit Number 40   Number of Visits  47   Date for PT Re-Evaluation 08/19/22    PT Start Time 1350 to 1439  (49 minutes)    Equipment Utilized During Treatment No gait belt   Activity Tolerance Patient limited by pain;Patient limited by fatigue    Behavior During Therapy [facility] for tasks assessed/performed          Past Medical History:  Diagnosis Date   Diabetes mellitus without complication (HCC)    Hypertension    Past Surgical History:  Procedure Laterality Date   CESAREAN SECTION     CHOLECYSTECTOMY     COLONOSCOPY WITH PROPOFOL N/A 12/10/2019   Procedure: COLONOSCOPY WITH PROPOFOL;  Surgeon: Wyline Mood, MD;  Location: [facility] ENDOSCOPY;  Service: Gastroenterology;  Laterality: N/A;   COLONOSCOPY WITH PROPOFOL N/A 12/24/2019   Procedure: COLONOSCOPY WITH PROPOFOL;  Surgeon: Wyline Mood, MD;  Location: [facility] ENDOSCOPY;  Service: Gastroenterology;  Laterality: N/A;   FRACTURE SURGERY     Patient Active Problem List   Diagnosis Date Noted   Hyperlipidemia associated with type 2 diabetes mellitus (HCC) 09/22/2021   Displaced fracture of base of neck of right femur, sequela 07/16/2021   Closed fracture of posterior wall of left acetabulum with routine healing 06/16/2021   Palpitations 03/19/2020   Diarrhea 03/19/2020   Premature atrial contractions 08/20/2018   Morbid (severe) obesity due to excess calories (HCC) 08/01/2018   Shortness of breath 07/26/2018   Chronic cough 03/22/2018   Uncomplicated type 2 diabetes mellitus (HCC) 03/22/2018   Hypertension 03/22/2018    PCP: Larae Grooms, NP   REFERRING PROVIDER: Ernst Bowler, MD  REFERRING DIAG:  Diagnosis  S32.15XD (ICD-10-CM) - Type 2 fracture of  sacrum, subsequent encounter for fracture with routine healing  S32.422D (ICD-10-CM) - Displaced fracture of posterior wall of left acetabulum, subsequent encounter for fracture with routine healing    Rationale for Evaluation and Treatment Rehabilitation  THERAPY DIAG:  Stiffness of left hip joint  Other low back pain  Gait difficulty  Pain in left hip  Balance problems  Muscle weakness (generalized)  ONSET DATE: 05/28/21  SUBJECTIVE:                                                                                                                                                                                           SUBJECTIVE STATEMENT:  06/27/22:  Pt. Has not been back to gym since last PT appt.  Pt. Walked into PT with marked improvement.  Pt. Reports 2/10 L hip pain (anterior) walking into clinic.        PERTINENT HISTORY:  Hx of L hip dislocation, and screws placed in lower back after her injury. Pt left hospital in a walker, and utilized a WC originally during [facility]. Pt has decreased ankle DF in L ankle, and pain in her lateral foot / 5th metatarsal. Pt had L knee scope, and has tenderness/bruising in L LE along tibia.  PAIN:  Are you having pain? NPRS scale: 4-5/10 Pain location: L hip (anterior) Pain description: popping and dull/achy Aggravating factors: standing, bending forward (especially left) Relieving factors: sitting down, tylenol PRN   PRECAUTIONS: Anterior hip, Posterior hip, Knee, and Fall  WEIGHT BEARING RESTRICTIONS no  FALLS:  Has patient fallen in last 6 months? Yes. Number of falls 1  LIVING ENVIRONMENT: Lives with: lives with their family Lives in: House/apartment Stairs: yes - 2 to get into home with raining Has following equipment at home: Quad cane large base and Wheelchair (manual)  OCCUPATION:  Pt is currently on long term disability. Previously worked at [employer] prior to injury. Would like to get back to work if the company can find  a position that is more stationary.  PLOF: Independent  PATIENT GOALS Pt wants to alleviate pain, become more mobile, strengthen back musculature, strengthen LEs   OBJECTIVE:   DIAGNOSTIC FINDINGS:  LUMBAR SPINE - 2-3 VIEW   COMPARISON:  MRI 09/28/2009   FINDINGS: 4-5 mm retrolisthesis L5-S1, slightly progressive since prior examination, possibly degenerative in nature. Otherwise normal lumbar lordosis. No acute fracture of the lumbar spine. Vertebral body height is preserved. Intervertebral disc space narrowing and endplate remodeling at O1-7 and L5-S1 is in keeping with mild degenerative disc disease at these levels. Paraspinal soft tissues are unremarkable. Right sacroiliac arthrodesis has been performed with 2 partially threaded screws.   IMPRESSION: No acute fracture or traumatic listhesis.   Degenerative changes L4-S1 with 5 mm retrolisthesis L5-S1.     Electronically Signed   By: Fidela Salisbury M.D.   On: 09/17/2021 22:13  PATIENT SURVEYS:  FOTO 52 ; 61  SCREENING FOR RED FLAGS: Bowel or bladder incontinence: No Spinal tumors: No Cauda equina syndrome: No Compression fracture: No Abdominal aneurysm: No  COGNITION:  Overall cognitive status: Within functional limits for tasks assessed     SENSATION: Not tested - patient reports altered sensation in L LE/foot  POSTURE: rounded shoulders, forward head, and weight shift left  PALPATION: Moderate L medial lower leg tenderness with palpation  LUMBAR ROM:   Active  AROM  eval  Flexion 50% limited (compensation to R)  Extension 75% limited (pain)  Right lateral flexion   Left lateral flexion   Right rotation 50% limited  Left rotation 25% limited   (Blank rows = not tested)  LOWER EXTREMITY ROM:     PROM/AROM Right eval Left eval  Hip flexion WFL 119/ 90 deg.  Hip extension    Hip abduction [facility]  Hip adduction    Hip internal rotation    Hip external rotation    Knee flexion WFL 106/ 99  deg.  Knee extension    Ankle dorsiflexion WFL Limited by pain  Ankle plantarflexion WFL Limited by pain  Ankle inversion    Ankle eversion     (Blank rows = not tested)  LOWER EXTREMITY MMT:  MMT Right eval Left eval  Hip flexion 4/5 3/5  Hip extension    Hip abduction 4/5 3/5  Hip adduction 4/5 4/5  Hip internal rotation 5/5 unable  Hip external rotation 5/5 3/5  Knee flexion 5/5 3/5 (pain)  Knee extension 5/5 4/5  Ankle dorsiflexion 4+/5 3/5  Ankle plantarflexion 5/5 4/5  Ankle inversion    Ankle eversion     (Blank rows = not tested)  FUNCTIONAL TESTS:  5 times sit to stand: 25.4 seconds .  9/21:  14.06 seconds (marked improvement).    GAIT: Distance walked: in clinic Assistive device utilized: Quad cane small base Level of assistance: Modified independence Comments: R sided Trendelenburg.  L antalgic gait pattern with decrease stance on L with short R LE step length/ shuffling.  No arm swing. Walks with wide base of support, with mild supination of B feet. Pt had limited endurance, and significantly limited L hip flexion and DF to clear her foot during swing phase.     01/25/22: 5xSTS: 15.94 sec./ 14.94 sec.  (Marked improvement).      9/21:  5xSTS: 14.06 seconds (marked improvement).      11/8: 5xSTS: 12.48 seconds  (improvement).      06/24/22: 9.89 seconds (marked improvement).     Treatment:  06/27/22:     There.ex.:       Walking in //-bars: forward/ backwards/ lateral/ added hurdles 4 laps each.  Light to no UE assist.  SBA for cuing.  No LOB.       Step touches with light UE assist (6"/ 12")- pain with L hip flexion.                 Walking in clinic/ hallway without use of SPC.  Amb. Around PT clinic (front) on sidewalk with 1 standing rest break required due to fatigue, not pain.     Nustep L4 10 min. B UE/LE at seat position 10 (consistent cadence)- 0.5 miles   Updated HEP (see below)    Discussed importance of completing a more consistent  aquatic ex. program Due to marked increase in L hip pain.  PT discussed/ answered questions about hip replacement.     PATIENT EDUCATION:  Education details: Access Code: [phone] Person educated: Patient Education method: Explanation, Demonstration, and Handouts Education comprehension: verbalized understanding and returned demonstration   HOME EXERCISE PROGRAM: Access Code: [email or web address] URL: [email or web address] Date: 11/25/2021 Prepared by: Dorene Grebe  Exercises - Straight Leg Raise  - 1 x daily - 7 x weekly - 2 sets - 10 reps - Supine Heel Slide  - 1 x daily - 7 x weekly - 2 sets - 10 reps - Supine Hip Adduction Isometric with Ball  - 1 x daily - 7 x weekly - 2 sets - 10 reps - Hooklying Gluteal Sets  - 1 x daily - 7 x weekly - 2 sets - 10 reps - Seated Heel Raise  - 1 x daily - 7 x weekly - 2 sets - 10 reps - Seated Heel Toe Raises  - 1 x daily - 7 x weekly - 2 sets - 10 reps  Access Code: [email or web address] URL: [email or web address] Date: 06/27/2022 Prepared by: Dorene Grebe  Exercises - Straight Leg Raise  - 1 x daily - 7 x weekly - 2 sets - 10 reps - Supine Hip Adduction Isometric with Ball  - 1 x daily - 7 x weekly - 2 sets - 10 reps - Hooklying Gluteal Sets  - 1  x daily - 7 x weekly - 2 sets - 10 reps - Standing Hip Abduction  - 1 x daily - 7 x weekly - 2 sets - 12 reps - Mini Squat with Counter Support  - 1 x daily - 7 x weekly - 2 sets - 12 reps  ASSESSMENT:  CLINICAL IMPRESSION: Pt. Worked hard during tx. Session today with standing/ walking tasks.   Moderate L antalgic gait pattern with increase standing/ wt. Bearing.   Pt. Instructed to return to aquatic ex.  And take meds as prescribed to manage symptoms. Pt. Given HEP with progressed exercises.  Pt. Will continue to benefit from transition from PT to more gym based/ aquatic ex.  Pt will benefit from further skilled PT in order to strengthen her LE's, and to progress her gait mechanics. Pt.  Will benefit from an increase in strengthening and endurance exercises of hip and gluteal muscles to continue improvement of gait & functional activities.   OBJECTIVE IMPAIRMENTS Abnormal gait, decreased activity tolerance, decreased balance, decreased endurance, decreased mobility, difficulty walking, decreased ROM, decreased strength, hypomobility, impaired flexibility, impaired sensation, improper body mechanics, postural dysfunction, obesity, and pain.   ACTIVITY LIMITATIONS carrying, lifting, bending, sitting, standing, squatting, sleeping, stairs, transfers, bed mobility, dressing, and locomotion level  PARTICIPATION LIMITATIONS: cleaning, laundry, driving, shopping, community activity, occupation, and yard work  PERSONAL FACTORS Age, Education, and Past/current experiences are also affecting patient's functional outcome.   REHAB POTENTIAL: Good  CLINICAL DECISION MAKING: Evolving/moderate complexity  EVALUATION COMPLEXITY: Moderate   GOALS: Goals reviewed with patient? Yes  SHORT TERM GOALS: Target date: 07/22/22  Pt will ambulate through the clinic with a symmetrical gait pattern, with equal step length and decreased pain for > 100 ft.  Baseline: Asymmetrical gait - increased L step length and decreased R step length Goal status: Partially met   LONG TERM GOALS: Target date: 08/19/22  Pt will increase FOTO score to > 58 Baseline: 9/7: 52 (no significant changes).  11/8: 49 (slight regression secondary to increase L anterior hip pain with stair climbing/ increase distance walked. Goal status: Not met  2.  Pt will decrease pain level to < 2/10 during all ADLs, in order to efficiently accomplish activities with decreased pain and increased independence Baseline:  Goal status: Partially met  3.  Pt will increase Active L knee flexion ROM to > 120 degrees  Baseline: 99, 106 passive.  8/8: 118 deg.  10/25: 118 deg.  Goal status: Partially met    PLAN: PT FREQUENCY:  1x/week  PT DURATION: 8 weeks  PLANNED INTERVENTIONS: Therapeutic exercises, Therapeutic activity, Neuromuscular re-education, Balance training, Gait training, Patient/Family education, Joint mobilization, Stair training, Aquatic Therapy, Cryotherapy, Moist heat, and Manual therapy.  PLAN FOR NEXT SESSION:  Check FOTO   Cammie Mcgee, PT, DPT # [phone] 06/27/2022, 2:39 PM  [facility] [address] [place], [place], [address] Phone: [phone]   Fax:  [phone]

## 2022-07-05 ENCOUNTER — Ambulatory Visit: Payer: Medicaid Other | Admitting: Physical Therapy

## 2022-07-05 DIAGNOSIS — R2689 Other abnormalities of gait and mobility: Secondary | ICD-10-CM | POA: Diagnosis not present

## 2022-07-05 DIAGNOSIS — M25552 Pain in left hip: Secondary | ICD-10-CM

## 2022-07-05 DIAGNOSIS — R29898 Other symptoms and signs involving the musculoskeletal system: Secondary | ICD-10-CM | POA: Diagnosis not present

## 2022-07-05 DIAGNOSIS — M25652 Stiffness of left hip, not elsewhere classified: Secondary | ICD-10-CM | POA: Diagnosis not present

## 2022-07-05 DIAGNOSIS — M6281 Muscle weakness (generalized): Secondary | ICD-10-CM

## 2022-07-05 DIAGNOSIS — R269 Unspecified abnormalities of gait and mobility: Secondary | ICD-10-CM

## 2022-07-05 DIAGNOSIS — M5459 Other low back pain: Secondary | ICD-10-CM | POA: Diagnosis not present

## 2022-07-05 NOTE — Therapy (Signed)
OUTPATIENT PHYSICAL THERAPY THORACOLUMBAR TREATMENT  Patient Name: Yesenia Stevens MRN: [id number] DOB:1965/11/13, 57 y.o., female Today's Date: 07/05/2022   PT End of Session -     Visit Number 41   Number of Visits  47   Date for PT Re-Evaluation 08/19/22    PT Start Time 1110 to 1150  (40 minutes)    Equipment Utilized During Treatment No gait belt   Activity Tolerance Patient limited by pain;Patient limited by fatigue    Behavior During Therapy [facility] for tasks assessed/performed          Past Medical History:  Diagnosis Date   Diabetes mellitus without complication (HCC)    Hypertension    Past Surgical History:  Procedure Laterality Date   CESAREAN SECTION     CHOLECYSTECTOMY     COLONOSCOPY WITH PROPOFOL N/A 12/10/2019   Procedure: COLONOSCOPY WITH PROPOFOL;  Surgeon: Wyline Mood, MD;  Location: [facility] ENDOSCOPY;  Service: Gastroenterology;  Laterality: N/A;   COLONOSCOPY WITH PROPOFOL N/A 12/24/2019   Procedure: COLONOSCOPY WITH PROPOFOL;  Surgeon: Wyline Mood, MD;  Location: [facility] ENDOSCOPY;  Service: Gastroenterology;  Laterality: N/A;   FRACTURE SURGERY     Patient Active Problem List   Diagnosis Date Noted   Hyperlipidemia associated with type 2 diabetes mellitus (HCC) 09/22/2021   Displaced fracture of base of neck of right femur, sequela 07/16/2021   Closed fracture of posterior wall of left acetabulum with routine healing 06/16/2021   Palpitations 03/19/2020   Diarrhea 03/19/2020   Premature atrial contractions 08/20/2018   Morbid (severe) obesity due to excess calories (HCC) 08/01/2018   Shortness of breath 07/26/2018   Chronic cough 03/22/2018   Uncomplicated type 2 diabetes mellitus (HCC) 03/22/2018   Hypertension 03/22/2018    PCP: Larae Grooms, NP   REFERRING PROVIDER: Ernst Bowler, MD  REFERRING DIAG:  Diagnosis  S32.15XD (ICD-10-CM) - Type 2 fracture of sacrum, subsequent encounter for fracture with routine healing  S32.422D  (ICD-10-CM) - Displaced fracture of posterior wall of left acetabulum, subsequent encounter for fracture with routine healing    Rationale for Evaluation and Treatment Rehabilitation  THERAPY DIAG:  Stiffness of left hip, not elsewhere classified  Impaired strength of hip muscles  Stiffness of left hip joint  Other low back pain  Gait difficulty  Pain in left hip  Balance problems  Muscle weakness (generalized)  ONSET DATE: 05/28/21  SUBJECTIVE:                                                                                                                                                                                           SUBJECTIVE STATEMENT: 07/05/22:  Pt. Arrived to therapy reporting L ant. Hip pain of 3/10 on NPS.  Pt. Reports returning to gym pool exercise 2x since last therapy session. Pt. Reported RPE of 6/7 during walking/gait based activities.   PERTINENT HISTORY:  Hx of L hip dislocation, and screws placed in lower back after her injury. Pt left hospital in a walker, and utilized a WC originally during [facility]. Pt has decreased ankle DF in L ankle, and pain in her lateral foot / 5th metatarsal. Pt had L knee scope, and has tenderness/bruising in L LE along tibia.  PAIN:  Are you having pain? NPRS scale: 4-5/10 Pain location: L hip (anterior) Pain description: popping and dull/achy Aggravating factors: standing, bending forward (especially left) Relieving factors: sitting down, tylenol PRN   PRECAUTIONS: Anterior hip, Posterior hip, Knee, and Fall  WEIGHT BEARING RESTRICTIONS no  FALLS:  Has patient fallen in last 6 months? Yes. Number of falls 1  LIVING ENVIRONMENT: Lives with: lives with their family Lives in: House/apartment Stairs: yes - 2 to get into home with raining Has following equipment at home: Quad cane large base and Wheelchair (manual)  OCCUPATION:  Pt is currently on long term disability. Previously worked at [employer] prior to  injury. Would like to get back to work if the company can find a position that is more stationary.  PLOF: Independent  PATIENT GOALS Pt wants to alleviate pain, become more mobile, strengthen back musculature, strengthen LEs   OBJECTIVE:   DIAGNOSTIC FINDINGS:  LUMBAR SPINE - 2-3 VIEW   COMPARISON:  MRI 09/28/2009   FINDINGS: 4-5 mm retrolisthesis L5-S1, slightly progressive since prior examination, possibly degenerative in nature. Otherwise normal lumbar lordosis. No acute fracture of the lumbar spine. Vertebral body height is preserved. Intervertebral disc space narrowing and endplate remodeling at O1-6 and L5-S1 is in keeping with mild degenerative disc disease at these levels. Paraspinal soft tissues are unremarkable. Right sacroiliac arthrodesis has been performed with 2 partially threaded screws.   IMPRESSION: No acute fracture or traumatic listhesis.   Degenerative changes L4-S1 with 5 mm retrolisthesis L5-S1.     Electronically Signed   By: Fidela Salisbury M.D.   On: 09/17/2021 22:13  PATIENT SURVEYS:  FOTO 52 ; 61  SCREENING FOR RED FLAGS: Bowel or bladder incontinence: No Spinal tumors: No Cauda equina syndrome: No Compression fracture: No Abdominal aneurysm: No  COGNITION:  Overall cognitive status: Within functional limits for tasks assessed     SENSATION: Not tested - patient reports altered sensation in L LE/foot  POSTURE: rounded shoulders, forward head, and weight shift left  PALPATION: Moderate L medial lower leg tenderness with palpation  LUMBAR ROM:   Active  AROM  eval  Flexion 50% limited (compensation to R)  Extension 75% limited (pain)  Right lateral flexion   Left lateral flexion   Right rotation 50% limited  Left rotation 25% limited   (Blank rows = not tested)  LOWER EXTREMITY ROM:     PROM/AROM Right eval Left eval  Hip flexion WFL 119/ 90 deg.  Hip extension    Hip abduction [facility]  Hip adduction    Hip internal  rotation    Hip external rotation    Knee flexion WFL 106/ 99 deg.  Knee extension    Ankle dorsiflexion WFL Limited by pain  Ankle plantarflexion WFL Limited by pain  Ankle inversion    Ankle eversion     (Blank rows = not tested)  LOWER EXTREMITY MMT:  MMT Right eval Left eval  Hip flexion 4/5 3/5  Hip extension    Hip abduction 4/5 3/5  Hip adduction 4/5 4/5  Hip internal rotation 5/5 unable  Hip external rotation 5/5 3/5  Knee flexion 5/5 3/5 (pain)  Knee extension 5/5 4/5  Ankle dorsiflexion 4+/5 3/5  Ankle plantarflexion 5/5 4/5  Ankle inversion    Ankle eversion     (Blank rows = not tested)  FUNCTIONAL TESTS:  5 times sit to stand: 25.4 seconds .  9/21:  14.06 seconds (marked improvement).    GAIT: Distance walked: in clinic Assistive device utilized: Quad cane small base Level of assistance: Modified independence Comments: R sided Trendelenburg.  L antalgic gait pattern with decrease stance on L with short R LE step length/ shuffling.  No arm swing. Walks with wide base of support, with mild supination of B feet. Pt had limited endurance, and significantly limited L hip flexion and DF to clear her foot during swing phase.     01/25/22: 5xSTS: 15.94 sec./ 14.94 sec.  (Marked improvement).      9/21:  5xSTS: 14.06 seconds (marked improvement).      11/8: 5xSTS: 12.48 seconds  (improvement).      06/24/22: 9.89 seconds (marked improvement).     Treatment:  07/05/22:     There.ex.:     Nustep L4 10 min. B UE/LE at seat position 10 (consistent cadence)- 0.5 miles      Walking in //-bars: forward 2 laps/ backwards 2 laps/ high knees 3 laps/ lateral stepping 2 laps. Light UE assist.  SBA for cuing.  No LOB. Rest breaks needed.      B Heel touches with mod. UE assist (6" step)- pain with L hip flexion. 3 x 10 reps no rest break.   6" hurdle step overs in //-bars 3 laps with some pain - forward & lateral              Discussed importance of completing a more  consistent aquatic ex. program Due to marked increase in L hip pain.  PT discussed/ answered questions about hip replacement.     PATIENT EDUCATION:  Education details: Access Code: [email or web address] Person educated: Patient Education method: Explanation, Demonstration, and Handouts Education comprehension: verbalized understanding and returned demonstration   HOME EXERCISE PROGRAM: Access Code: [email or web address] URL: [email or web address] Date: 11/25/2021 Prepared by: Dorene Grebe  Exercises - Straight Leg Raise  - 1 x daily - 7 x weekly - 2 sets - 10 reps - Supine Heel Slide  - 1 x daily - 7 x weekly - 2 sets - 10 reps - Supine Hip Adduction Isometric with Ball  - 1 x daily - 7 x weekly - 2 sets - 10 reps - Hooklying Gluteal Sets  - 1 x daily - 7 x weekly - 2 sets - 10 reps - Seated Heel Raise  - 1 x daily - 7 x weekly - 2 sets - 10 reps - Seated Heel Toe Raises  - 1 x daily - 7 x weekly - 2 sets - 10 reps  Access Code: [email or web address] URL: [email or web address] Date: 06/27/2022 Prepared by: Dorene Grebe  Exercises - Straight Leg Raise  - 1 x daily - 7 x weekly - 2 sets - 10 reps - Supine Hip Adduction Isometric with Ball  - 1 x daily - 7 x weekly - 2 sets - 10 reps - Hooklying Gluteal Sets  - 1 x daily - 7 x weekly - 2 sets -  10 reps - Standing Hip Abduction  - 1 x daily - 7 x weekly - 2 sets - 12 reps - Mini Squat with Counter Support  - 1 x daily - 7 x weekly - 2 sets - 12 reps  ASSESSMENT:  CLINICAL IMPRESSION: Pt. Worked hard during tx. Session today with standing/ walking tasks.  Moderate L antalgic gait pattern with increase standing/ wt. Bearing. Pt. Benefited from cuing to increase hip flexion during walking activities. Pt. limited in endurance due to both weakness and pain. Pt. Required rest breaks between sets due to decreased endurance. Pt. Will continue to benefit from transition from PT to increase gym based/ aquatic ex.  Pt will benefit from further  skilled PT in order to strengthen her LE's, and to progress her gait mechanics. Pt. Will benefit from an increase in strengthening and endurance exercises of hip and gluteal muscles to continue improvement of gait & functional activities.   OBJECTIVE IMPAIRMENTS Abnormal gait, decreased activity tolerance, decreased balance, decreased endurance, decreased mobility, difficulty walking, decreased ROM, decreased strength, hypomobility, impaired flexibility, impaired sensation, improper body mechanics, postural dysfunction, obesity, and pain.   ACTIVITY LIMITATIONS carrying, lifting, bending, sitting, standing, squatting, sleeping, stairs, transfers, bed mobility, dressing, and locomotion level  PARTICIPATION LIMITATIONS: cleaning, laundry, driving, shopping, community activity, occupation, and yard work  PERSONAL FACTORS Age, Education, and Past/current experiences are also affecting patient's functional outcome.   REHAB POTENTIAL: Good  CLINICAL DECISION MAKING: Evolving/moderate complexity  EVALUATION COMPLEXITY: Moderate   GOALS: Goals reviewed with patient? Yes  SHORT TERM GOALS: Target date: 07/22/22  Pt will ambulate through the clinic with a symmetrical gait pattern, with equal step length and decreased pain for > 100 ft.  Baseline: Asymmetrical gait - increased L step length and decreased R step length Goal status: Partially met   LONG TERM GOALS: Target date: 08/19/22  Pt will increase FOTO score to > 58 Baseline: 9/7: 52 (no significant changes).  11/8: 49 (slight regression secondary to increase L anterior hip pain with stair climbing/ increase distance walked.  1/16: 46 (continued regression/ pt. Considering THA) Goal status: Not met  2.  Pt will decrease pain level to < 2/10 during all ADLs, in order to efficiently accomplish activities with decreased pain and increased independence Baseline:  Goal status: Partially met  3.  Pt will increase Active L knee flexion ROM to >  120 degrees  Baseline: 99, 106 passive.  8/8: 118 deg.  10/25: 118 deg.  Goal status: Partially met    PLAN: PT FREQUENCY: 1x/week  PT DURATION: 8 weeks  PLANNED INTERVENTIONS: Therapeutic exercises, Therapeutic activity, Neuromuscular re-education, Balance training, Gait training, Patient/Family education, Joint mobilization, Stair training, Aquatic Therapy, Cryotherapy, Moist heat, and Manual therapy.  PLAN FOR NEXT SESSION:  Standing dynamic ex.    Robi Mitter B. Rogers Blocker, SPT Pura Spice, PT, DPT # [phone] 07/05/2022, 2:05 PM  [facility] [address] [place], [place], [address] Phone: [phone]   Fax:  [phone]

## 2022-07-07 ENCOUNTER — Ambulatory Visit (INDEPENDENT_AMBULATORY_CARE_PROVIDER_SITE_OTHER): Payer: BLUE CROSS/BLUE SHIELD | Admitting: Physician Assistant

## 2022-07-07 ENCOUNTER — Encounter: Payer: Self-pay | Admitting: Physician Assistant

## 2022-07-07 ENCOUNTER — Encounter: Payer: BC Managed Care – PPO | Admitting: Physical Therapy

## 2022-07-07 VITALS — BP 108/78 | HR 97 | Temp 98.0°F | Ht 64.0 in | Wt 282.6 lb

## 2022-07-07 DIAGNOSIS — S20419A Abrasion of unspecified back wall of thorax, initial encounter: Secondary | ICD-10-CM | POA: Diagnosis not present

## 2022-07-07 MED ORDER — MUPIROCIN 2 % EX OINT: 1.0000 | TOPICAL_OINTMENT | Freq: Every day | CUTANEOUS | 0 refills | Status: AC

## 2022-07-07 NOTE — Patient Instructions (Signed)
For the wound on your back I recommend the following  Keep it clean and dry- gentle soap and warm water, no scrubbing or harsh cleansers needed Use Aquaphor or Vaseline to keep it moist Use the antibiotic ointment I sent in once per day for 5 days to prevent infection You can use a bandage to keep it covered as desired - just make sure you are changing it daily   Let us know if it seems like it is getting worse or not improving at all.

## 2022-07-07 NOTE — Progress Notes (Signed)
Acute Office Visit   Patient: Yesenia Stevens   DOB: 05-05-1966   57 y.o. Female  MRN: [id number] Visit Date: 07/07/2022  Today's healthcare provider: Dani Gobble Tristain Daily, PA-C  Introduced myself to the patient as a Journalist, newspaper and provided education on APPs in clinical practice.    Chief Complaint  Patient presents with   Sore    Patient says she noticed a sore on her back that she noticed on her back before Christmas. Patient says she has noticed a small amount of drainage when she changes the bandage on her back. Patient says she keeps the area covered with a bandage, denies any prior treatment.   Subjective    HPI HPI     Sore    Additional comments: Patient says she noticed a sore on her back that she noticed on her back before Christmas. Patient says she has noticed a small amount of drainage when she changes the bandage on her back. Patient says she keeps the area covered with a bandage, denies any prior treatment.      Last edited by Irena Reichmann, [place] on 07/07/2022 11:02 AM.      Reports a sore on her back She is not sure what caused it  She noticed it a few weeks before Christmas  States certain positions or laying on it seem to hurt She has tried to keep a bandage over it and has noticed some drainage on the bandage She denies burns, injuries or trauma to the area Interventions: Bandages, no topicals        Medications: Outpatient Medications Prior to Visit  Medication Sig   albuterol (PROVENTIL HFA;VENTOLIN HFA) 108 (90 Base) MCG/ACT inhaler Inhale 1-2 puffs into the lungs every 6 (six) hours as needed for wheezing or shortness of breath.   amLODipine (NORVASC) 10 MG tablet Take 1 tablet (10 mg total) by mouth daily.   empagliflozin (JARDIANCE) 10 MG TABS tablet Take 1 tablet (10 mg total) by mouth daily before breakfast.   glipiZIDE (GLUCOTROL) 10 MG tablet Take 1 tablet (10 mg total) by mouth 2 (two) times daily before a meal.   hydrochlorothiazide  (HYDRODIURIL) 25 MG tablet Take 1 tablet (25 mg total) by mouth daily.   hydrocortisone 2.5 % cream SMARTSIG:sparingly Topical Twice Daily   ibuprofen (ADVIL) 600 MG tablet Take 1 tablet (600 mg total) by mouth every 6 (six) hours as needed.   metFORMIN (GLUCOPHAGE) 1000 MG tablet Take 1 tablet (1,000 mg total) by mouth 2 (two) times daily with a meal.   rosuvastatin (CRESTOR) 5 MG tablet Take 1 tablet (5 mg total) by mouth daily.   tirzepatide [facility]) 5 MG/0.5ML Pen Inject 5 mg into the skin once a week.   valsartan (DIOVAN) 80 MG tablet Take 1 tablet (80 mg total) by mouth daily.   No facility-administered medications prior to visit.    Review of Systems  Skin:  Positive for wound.       Objective    BP 108/78   Pulse 97   Temp 98 F (36.7 C) (Oral)   Ht 5\' 4"  (1.626 m)   Wt 282 lb 9.6 oz (128.2 kg)   LMP 08/20/2015 (Approximate) Comment: denies preg  SpO2 99%   BMI 48.51 kg/m    Physical Exam Vitals reviewed.  Constitutional:      Appearance: Normal appearance.  HENT:     Head: Normocephalic and atraumatic.  Pulmonary:     Effort:  Pulmonary effort is normal.  Skin:    General: Skin is warm.     Findings: Wound present.       Neurological:     General: No focal deficit present.     Mental Status: She is alert and oriented to person, place, and time. Mental status is at baseline.  Psychiatric:        Mood and Affect: Mood normal.        Behavior: Behavior normal.        Thought Content: Thought content normal.        Judgment: Judgment normal.       No results found for any visits on 07/07/22.  Assessment & Plan      No follow-ups on file.     Problem List Items Addressed This Visit   None Visit Diagnoses     Abrasion of back, unspecified laterality, initial encounter    -  Primary Acute, new concern Mildly elevated lesion on lower back with scant serous drainage and scab formation consistent with superficial wound or abrasion No evidence of  purulent drainage, swelling, erythema, streaking at this time Recommend she keep area clean dry and moisturized with Aquaphor or vaseline Will provide Mupirocin to assist with preventing bacterial infection  She can keep covered with bandage as desired to prevent contamination and irritation Follow up as needed for persistent or progressing symptoms      Relevant Medications   mupirocin ointment (BACTROBAN) 2 %        No follow-ups on file.   I, Felton Buczynski E Minka Knight, PA-C, have reviewed all documentation for this visit. The documentation on 07/07/22 for the exam, diagnosis, procedures, and orders are all accurate and complete.   Talitha Givens, MHS, PA-C [facility]

## 2022-07-12 ENCOUNTER — Ambulatory Visit: Payer: Medicaid Other | Admitting: Physical Therapy

## 2022-07-12 DIAGNOSIS — R29898 Other symptoms and signs involving the musculoskeletal system: Secondary | ICD-10-CM | POA: Diagnosis not present

## 2022-07-12 DIAGNOSIS — R269 Unspecified abnormalities of gait and mobility: Secondary | ICD-10-CM | POA: Diagnosis not present

## 2022-07-12 DIAGNOSIS — R2689 Other abnormalities of gait and mobility: Secondary | ICD-10-CM

## 2022-07-12 DIAGNOSIS — M25552 Pain in left hip: Secondary | ICD-10-CM

## 2022-07-12 DIAGNOSIS — M25652 Stiffness of left hip, not elsewhere classified: Secondary | ICD-10-CM | POA: Diagnosis not present

## 2022-07-12 DIAGNOSIS — M6281 Muscle weakness (generalized): Secondary | ICD-10-CM | POA: Diagnosis not present

## 2022-07-12 DIAGNOSIS — M5459 Other low back pain: Secondary | ICD-10-CM

## 2022-07-12 NOTE — Therapy (Signed)
OUTPATIENT PHYSICAL THERAPY THORACOLUMBAR TREATMENT  Patient Name: Yesenia Stevens MRN: [id number] DOB:1966/05/05, 57 y.o., female Today's Date: 07/12/2022   PT End of Session -     Visit Number 42   Number of Visits  47   Date for PT Re-Evaluation 08/19/22    PT Start Time 0729 to 0822  (53 minutes)    Equipment Utilized During Treatment No gait belt   Activity Tolerance Patient limited by pain;Patient limited by fatigue    Behavior During Therapy [facility] for tasks assessed/performed          Past Medical History:  Diagnosis Date   Diabetes mellitus without complication (HCC)    Hypertension    Past Surgical History:  Procedure Laterality Date   CESAREAN SECTION     CHOLECYSTECTOMY     COLONOSCOPY WITH PROPOFOL N/A 12/10/2019   Procedure: COLONOSCOPY WITH PROPOFOL;  Surgeon: Wyline Mood, MD;  Location: [facility] ENDOSCOPY;  Service: Gastroenterology;  Laterality: N/A;   COLONOSCOPY WITH PROPOFOL N/A 12/24/2019   Procedure: COLONOSCOPY WITH PROPOFOL;  Surgeon: Wyline Mood, MD;  Location: [facility] ENDOSCOPY;  Service: Gastroenterology;  Laterality: N/A;   FRACTURE SURGERY     Patient Active Problem List   Diagnosis Date Noted   Hyperlipidemia associated with type 2 diabetes mellitus (HCC) 09/22/2021   Displaced fracture of base of neck of right femur, sequela 07/16/2021   Closed fracture of posterior wall of left acetabulum with routine healing 06/16/2021   Palpitations 03/19/2020   Diarrhea 03/19/2020   Premature atrial contractions 08/20/2018   Morbid (severe) obesity due to excess calories (HCC) 08/01/2018   Shortness of breath 07/26/2018   Chronic cough 03/22/2018   Uncomplicated type 2 diabetes mellitus (HCC) 03/22/2018   Hypertension 03/22/2018    PCP: Larae Grooms, NP   REFERRING PROVIDER: Ernst Bowler, MD  REFERRING DIAG:  Diagnosis  S32.15XD (ICD-10-CM) - Type 2 fracture of sacrum, subsequent encounter for fracture with routine healing  S32.422D  (ICD-10-CM) - Displaced fracture of posterior wall of left acetabulum, subsequent encounter for fracture with routine healing    Rationale for Evaluation and Treatment Rehabilitation  THERAPY DIAG:  Stiffness of left hip, not elsewhere classified  Other low back pain  Gait difficulty  Pain in left hip  Balance problem  Muscle weakness (generalized)  ONSET DATE: 05/28/21  SUBJECTIVE:                                                                                                                                                                                           SUBJECTIVE STATEMENT: 07/05/22:  Pt. Arrived to therapy reporting L ant. Hip pain of 3/10  on NPS.  Pt. Reports returning to gym pool exercise 2x since last therapy session. Pt. Reported RPE of 6/7 during walking/gait based activities.   PERTINENT HISTORY:  Hx of L hip dislocation, and screws placed in lower back after her injury. Pt left hospital in a walker, and utilized a WC originally during [facility]. Pt has decreased ankle DF in L ankle, and pain in her lateral foot / 5th metatarsal. Pt had L knee scope, and has tenderness/bruising in L LE along tibia.  PAIN:  Are you having pain? NPRS scale: 4-5/10 Pain location: L hip (anterior) Pain description: popping and dull/achy Aggravating factors: standing, bending forward (especially left) Relieving factors: sitting down, tylenol PRN   PRECAUTIONS: Anterior hip, Posterior hip, Knee, and Fall  WEIGHT BEARING RESTRICTIONS no  FALLS:  Has patient fallen in last 6 months? Yes. Number of falls 1  LIVING ENVIRONMENT: Lives with: lives with their family Lives in: House/apartment Stairs: yes - 2 to get into home with raining Has following equipment at home: Quad cane large base and Wheelchair (manual)  OCCUPATION:  Pt is currently on long term disability. Previously worked at [employer] prior to injury. Would like to get back to work if the company can find a position  that is more stationary.  PLOF: Independent  PATIENT GOALS Pt wants to alleviate pain, become more mobile, strengthen back musculature, strengthen LEs   OBJECTIVE:   DIAGNOSTIC FINDINGS:  LUMBAR SPINE - 2-3 VIEW   COMPARISON:  MRI 09/28/2009   FINDINGS: 4-5 mm retrolisthesis L5-S1, slightly progressive since prior examination, possibly degenerative in nature. Otherwise normal lumbar lordosis. No acute fracture of the lumbar spine. Vertebral body height is preserved. Intervertebral disc space narrowing and endplate remodeling at U1-3 and L5-S1 is in keeping with mild degenerative disc disease at these levels. Paraspinal soft tissues are unremarkable. Right sacroiliac arthrodesis has been performed with 2 partially threaded screws.   IMPRESSION: No acute fracture or traumatic listhesis.   Degenerative changes L4-S1 with 5 mm retrolisthesis L5-S1.     Electronically Signed   By: Fidela Salisbury M.D.   On: 09/17/2021 22:13  PATIENT SURVEYS:  FOTO 52 ; 61  SCREENING FOR RED FLAGS: Bowel or bladder incontinence: No Spinal tumors: No Cauda equina syndrome: No Compression fracture: No Abdominal aneurysm: No  COGNITION:  Overall cognitive status: Within functional limits for tasks assessed     SENSATION: Not tested - patient reports altered sensation in L LE/foot  POSTURE: rounded shoulders, forward head, and weight shift left  PALPATION: Moderate L medial lower leg tenderness with palpation  LUMBAR ROM:   Active  AROM  eval  Flexion 50% limited (compensation to R)  Extension 75% limited (pain)  Right lateral flexion   Left lateral flexion   Right rotation 50% limited  Left rotation 25% limited   (Blank rows = not tested)  LOWER EXTREMITY ROM:     PROM/AROM Right eval Left eval  Hip flexion WFL 119/ 90 deg.  Hip extension    Hip abduction [facility]  Hip adduction    Hip internal rotation    Hip external rotation    Knee flexion WFL 106/ 99 deg.  Knee  extension    Ankle dorsiflexion WFL Limited by pain  Ankle plantarflexion WFL Limited by pain  Ankle inversion    Ankle eversion     (Blank rows = not tested)  LOWER EXTREMITY MMT:    MMT Right eval Left eval  Hip flexion 4/5 3/5  Hip extension    Hip abduction 4/5 3/5  Hip adduction 4/5 4/5  Hip internal rotation 5/5 unable  Hip external rotation 5/5 3/5  Knee flexion 5/5 3/5 (pain)  Knee extension 5/5 4/5  Ankle dorsiflexion 4+/5 3/5  Ankle plantarflexion 5/5 4/5  Ankle inversion    Ankle eversion     (Blank rows = not tested)  FUNCTIONAL TESTS:  5 times sit to stand: 25.4 seconds .  9/21:  14.06 seconds (marked improvement).    GAIT: Distance walked: in clinic Assistive device utilized: Quad cane small base Level of assistance: Modified independence Comments: R sided Trendelenburg.  L antalgic gait pattern with decrease stance on L with short R LE step length/ shuffling.  No arm swing. Walks with wide base of support, with mild supination of B feet. Pt had limited endurance, and significantly limited L hip flexion and DF to clear her foot during swing phase.     01/25/22: 5xSTS: 15.94 sec./ 14.94 sec.  (Marked improvement).      9/21:  5xSTS: 14.06 seconds (marked improvement).      11/8: 5xSTS: 12.48 seconds  (improvement).      06/24/22: 9.89 seconds (marked improvement).    Subjective:  07/12/22:   Pt. Arrived to therapy reporting L ant. Hip pain of 5/10 on NPS.  Pt. Reports having ant. Hip pain 8/10 NPS over the weekend and exacerbation of sx. Due to the cold weather. Pt. States she has not went to the gym since our last visit.    Treatment:      There.ex.:     Nustep L4 10 min. B UE/LE at seat position 9 (consistent cadence)- 0.48 miles    Walking in //-bars: forward 4 laps/ high knees 3 laps. Mod. UE assist.  SBA for cuing.  No LOB. Rest breaks needed.    B Heel touches with mod. UE assist (6" step)- pain with L hip flexion. 3 x 15 rest breaks in between.    Hip abd./ext. with 4 lb. Ankle wt.'s in //-bars for mod. UE support 1 x 10 each. Pt. Limited by pain and endurance.   Discussed HEP/ gym based ex.     PATIENT EDUCATION:  Education details: Access Code: [phone] Person educated: Patient Education method: Explanation, Demonstration, and Handouts Education comprehension: verbalized understanding and returned demonstration   HOME EXERCISE PROGRAM: Access Code: [email or web address] URL: [email or web address] Date: 11/25/2021 Prepared by: Dorcas Carrow  Exercises - Straight Leg Raise  - 1 x daily - 7 x weekly - 2 sets - 10 reps - Supine Heel Slide  - 1 x daily - 7 x weekly - 2 sets - 10 reps - Supine Hip Adduction Isometric with Ball  - 1 x daily - 7 x weekly - 2 sets - 10 reps - Hooklying Gluteal Sets  - 1 x daily - 7 x weekly - 2 sets - 10 reps - Seated Heel Raise  - 1 x daily - 7 x weekly - 2 sets - 10 reps - Seated Heel Toe Raises  - 1 x daily - 7 x weekly - 2 sets - 10 reps  Access Code: [email or web address] URL: [email or web address] Date: 06/27/2022 Prepared by: Dorcas Carrow  Exercises - Straight Leg Raise  - 1 x daily - 7 x weekly - 2 sets - 10 reps - Supine Hip Adduction Isometric with Ball  - 1 x daily - 7 x weekly - 2 sets - 10 reps - Hooklying Gluteal Sets  - 1 x daily -  7 x weekly - 2 sets - 10 reps - Standing Hip Abduction  - 1 x daily - 7 x weekly - 2 sets - 12 reps - Mini Squat with Counter Support  - 1 x daily - 7 x weekly - 2 sets - 12 reps  ASSESSMENT:  CLINICAL IMPRESSION:  Pt. Worked hard during tx. Session today with standing/ walking tasks.  Significant L antalgic gait pattern with increase standing/ wt. Bearing.  Pt. Required cuing to have equal step lengths and have a wide BOS. Pt. Cued to have equal distribution of weight through legs but had antalgic L LE gait when attempting. Pt. required frequent rest breaks due to limitations from pain and endurance. Mod UE support required for weight  bearing activities. Pt. Was unable to complete 6" step ups due to pain when weight bearing through her L hip. Pt. Will continue to benefit from transition from PT to increase gym based/ aquatic ex.  Pt will benefit from further skilled PT in order to strengthen her LE's, and to progress her gait mechanics by decreasing her antalgic gait. Pt. Will benefit from an increase in strengthening and endurance exercises of hip and gluteal muscles to continue improvement of gait & functional activities.   OBJECTIVE IMPAIRMENTS Abnormal gait, decreased activity tolerance, decreased balance, decreased endurance, decreased mobility, difficulty walking, decreased ROM, decreased strength, hypomobility, impaired flexibility, impaired sensation, improper body mechanics, postural dysfunction, obesity, and pain.   ACTIVITY LIMITATIONS carrying, lifting, bending, sitting, standing, squatting, sleeping, stairs, transfers, bed mobility, dressing, and locomotion level  PARTICIPATION LIMITATIONS: cleaning, laundry, driving, shopping, community activity, occupation, and yard work  PERSONAL FACTORS Age, Education, and Past/current experiences are also affecting patient's functional outcome.   REHAB POTENTIAL: Good  CLINICAL DECISION MAKING: Evolving/moderate complexity  EVALUATION COMPLEXITY: Moderate   GOALS: Goals reviewed with patient? Yes  SHORT TERM GOALS: Target date: 07/22/22  Pt will ambulate through the clinic with a symmetrical gait pattern, with equal step length and decreased pain for > 100 ft.  Baseline: Asymmetrical gait - increased L step length and decreased R step length Goal status: Partially met   LONG TERM GOALS: Target date: 08/19/22  Pt will increase FOTO score to > 58 Baseline: 9/7: 52 (no significant changes).  11/8: 49 (slight regression secondary to increase L anterior hip pain with stair climbing/ increase distance walked.  1/16: 46 (continued regression/ pt. Considering THA) Goal status:  Not met  2.  Pt will decrease pain level to < 2/10 during all ADLs, in order to efficiently accomplish activities with decreased pain and increased independence Baseline:  Goal status: Partially met  3.  Pt will increase Active L knee flexion ROM to > 120 degrees  Baseline: 99, 106 passive.  8/8: 118 deg.  10/25: 118 deg.  Goal status: Partially met  PLAN: PT FREQUENCY: 1x/week  PT DURATION: 8 weeks  PLANNED INTERVENTIONS: Therapeutic exercises, Therapeutic activity, Neuromuscular re-education, Balance training, Gait training, Patient/Family education, Joint mobilization, Stair training, Aquatic Therapy, Cryotherapy, Moist heat, and Manual therapy.  PLAN FOR NEXT SESSION:  More standing dynamic ex., functional activity conditioning (walking, steps)  Katessa Attridge B. Rogers Blocker, SPT Pura Spice, PT, DPT # [phone] 07/12/2022, 10:32 AM  [facility] [address] [place], [place], [address] Phone: [phone]   Fax:  [phone]

## 2022-07-14 ENCOUNTER — Encounter: Payer: BC Managed Care – PPO | Admitting: Physical Therapy

## 2022-07-18 DIAGNOSIS — E119 Type 2 diabetes mellitus without complications: Secondary | ICD-10-CM | POA: Diagnosis not present

## 2022-07-18 DIAGNOSIS — S32422D Displaced fracture of posterior wall of left acetabulum, subsequent encounter for fracture with routine healing: Secondary | ICD-10-CM | POA: Diagnosis not present

## 2022-07-19 ENCOUNTER — Ambulatory Visit: Payer: Medicaid Other | Admitting: Physical Therapy

## 2022-07-19 DIAGNOSIS — M25552 Pain in left hip: Secondary | ICD-10-CM

## 2022-07-19 DIAGNOSIS — R29898 Other symptoms and signs involving the musculoskeletal system: Secondary | ICD-10-CM | POA: Diagnosis not present

## 2022-07-19 DIAGNOSIS — R2689 Other abnormalities of gait and mobility: Secondary | ICD-10-CM

## 2022-07-19 DIAGNOSIS — M6281 Muscle weakness (generalized): Secondary | ICD-10-CM | POA: Diagnosis not present

## 2022-07-19 DIAGNOSIS — M25652 Stiffness of left hip, not elsewhere classified: Secondary | ICD-10-CM | POA: Diagnosis not present

## 2022-07-19 DIAGNOSIS — M5459 Other low back pain: Secondary | ICD-10-CM | POA: Diagnosis not present

## 2022-07-19 DIAGNOSIS — R269 Unspecified abnormalities of gait and mobility: Secondary | ICD-10-CM | POA: Diagnosis not present

## 2022-07-19 NOTE — Therapy (Signed)
OUTPATIENT PHYSICAL THERAPY THORACOLUMBAR TREATMENT  Patient Name: Yesenia Stevens MRN: [id number] DOB:1965-12-22, 57 y.o., female Today's Date: 07/19/2022   PT End of Session - 07/19/22 1114     Visit Number 43    Number of Visits 47    Date for PT Re-Evaluation 08/19/22    PT Start Time 1114    PT Stop Time 1202    PT Time Calculation (min) 48 min    Activity Tolerance Patient tolerated treatment well;No increased pain;Patient limited by fatigue    Behavior During Therapy [facility] for tasks assessed/performed             Past Medical History:  Diagnosis Date   Diabetes mellitus without complication ([place])    Hypertension    Past Surgical History:  Procedure Laterality Date   CESAREAN SECTION     CHOLECYSTECTOMY     COLONOSCOPY WITH PROPOFOL N/A 12/10/2019   Procedure: COLONOSCOPY WITH PROPOFOL;  Surgeon: [name], MD;  Location: [facility] ENDOSCOPY;  Service: Gastroenterology;  Laterality: N/A;   COLONOSCOPY WITH PROPOFOL N/A 12/24/2019   Procedure: COLONOSCOPY WITH PROPOFOL;  Surgeon: [name], MD;  Location: [facility] ENDOSCOPY;  Service: Gastroenterology;  Laterality: N/A;   FRACTURE SURGERY     Patient Active Problem List   Diagnosis Date Noted   Hyperlipidemia associated with type 2 diabetes mellitus ([place]) 09/22/2021   Displaced fracture of base of neck of right femur, sequela 07/16/2021   Closed fracture of posterior wall of left acetabulum with routine healing 06/16/2021   Palpitations 03/19/2020   Diarrhea 03/19/2020   Premature atrial contractions 08/20/2018   Morbid (severe) obesity due to excess calories ([place]) 08/01/2018   Shortness of breath 07/26/2018   Chronic cough 56/43/3295   Uncomplicated type 2 diabetes mellitus ([place]) 03/22/2018   Hypertension 03/22/2018    PCP: Jon Billings, NP   REFERRING PROVIDER: Katherine Mantle, MD  REFERRING DIAG:  Diagnosis  S32.15XD (ICD-10-CM) - Type 2 fracture of sacrum, subsequent encounter for fracture with  routine healing  S32.422D (ICD-10-CM) - Displaced fracture of posterior wall of left acetabulum, subsequent encounter for fracture with routine healing    Rationale for Evaluation and Treatment Rehabilitation  THERAPY DIAG:  Impaired strength of hip muscles  Stiffness of left hip joint  Other low back pain  Gait difficulty  Pain in left hip  Balance problems  Muscle weakness (generalized)  ONSET DATE: 05/28/21  SUBJECTIVE:                                                                                                                                                                                           SUBJECTIVE  STATEMENT: 07/05/22:  Pt. Arrived to therapy reporting L ant. Hip pain of 3/10 on NPS.  Pt. Reports returning to gym pool exercise 2x since last therapy session. Pt. Reported RPE of 6/7 during walking/gait based activities.   PERTINENT HISTORY:  Hx of L hip dislocation, and screws placed in lower back after her injury. Pt left hospital in a walker, and utilized a WC originally during [facility]. Pt has decreased ankle DF in L ankle, and pain in her lateral foot / 5th metatarsal. Pt had L knee scope, and has tenderness/bruising in L LE along tibia.  PAIN:  Are you having pain? NPRS scale: 4-5/10 Pain location: L hip (anterior) Pain description: popping and dull/achy Aggravating factors: standing, bending forward (especially left) Relieving factors: sitting down, tylenol PRN   PRECAUTIONS: Anterior hip, Posterior hip, Knee, and Fall  WEIGHT BEARING RESTRICTIONS no  FALLS:  Has patient fallen in last 6 months? Yes. Number of falls 1  LIVING ENVIRONMENT: Lives with: lives with their family Lives in: House/apartment Stairs: yes - 2 to get into home with raining Has following equipment at home: Quad cane large base and Wheelchair (manual)  OCCUPATION:  Pt is currently on long term disability. Previously worked at [employer] prior to injury. Would like to get back  to work if the company can find a position that is more stationary.  PLOF: Independent  PATIENT GOALS Pt wants to alleviate pain, become more mobile, strengthen back musculature, strengthen LEs   OBJECTIVE:   DIAGNOSTIC FINDINGS:  LUMBAR SPINE - 2-3 VIEW   COMPARISON:  MRI 09/28/2009   FINDINGS: 4-5 mm retrolisthesis L5-S1, slightly progressive since prior examination, possibly degenerative in nature. Otherwise normal lumbar lordosis. No acute fracture of the lumbar spine. Vertebral body height is preserved. Intervertebral disc space narrowing and endplate remodeling at B3-4 and L5-S1 is in keeping with mild degenerative disc disease at these levels. Paraspinal soft tissues are unremarkable. Right sacroiliac arthrodesis has been performed with 2 partially threaded screws.   IMPRESSION: No acute fracture or traumatic listhesis.   Degenerative changes L4-S1 with 5 mm retrolisthesis L5-S1.     Electronically Signed   By: Fidela Salisbury M.D.   On: 09/17/2021 22:13  PATIENT SURVEYS:  FOTO 52 ; 61  SCREENING FOR RED FLAGS: Bowel or bladder incontinence: No Spinal tumors: No Cauda equina syndrome: No Compression fracture: No Abdominal aneurysm: No  COGNITION:  Overall cognitive status: Within functional limits for tasks assessed     SENSATION: Not tested - patient reports altered sensation in L LE/foot  POSTURE: rounded shoulders, forward head, and weight shift left  PALPATION: Moderate L medial lower leg tenderness with palpation  LUMBAR ROM:   Active  AROM  eval  Flexion 50% limited (compensation to R)  Extension 75% limited (pain)  Right lateral flexion   Left lateral flexion   Right rotation 50% limited  Left rotation 25% limited   (Blank rows = not tested)  LOWER EXTREMITY ROM:     PROM/AROM Right eval Left eval  Hip flexion WFL 119/ 90 deg.  Hip extension    Hip abduction [facility]  Hip adduction    Hip internal rotation    Hip external  rotation    Knee flexion WFL 106/ 99 deg.  Knee extension    Ankle dorsiflexion WFL Limited by pain  Ankle plantarflexion WFL Limited by pain  Ankle inversion    Ankle eversion     (Blank rows = not tested)  LOWER EXTREMITY  MMT:    MMT Right eval Left eval  Hip flexion 4/5 3/5  Hip extension    Hip abduction 4/5 3/5  Hip adduction 4/5 4/5  Hip internal rotation 5/5 unable  Hip external rotation 5/5 3/5  Knee flexion 5/5 3/5 (pain)  Knee extension 5/5 4/5  Ankle dorsiflexion 4+/5 3/5  Ankle plantarflexion 5/5 4/5  Ankle inversion    Ankle eversion     (Blank rows = not tested)  FUNCTIONAL TESTS:  5 times sit to stand: 25.4 seconds .  9/21:  14.06 seconds (marked improvement).    GAIT: Distance walked: in clinic Assistive device utilized: Quad cane small base Level of assistance: Modified independence Comments: R sided Trendelenburg.  L antalgic gait pattern with decrease stance on L with short R LE step length/ shuffling.  No arm swing. Walks with wide base of support, with mild supination of B feet. Pt had limited endurance, and significantly limited L hip flexion and DF to clear her foot during swing phase.     01/25/22: 5xSTS: 15.94 sec./ 14.94 sec.  (Marked improvement).      9/21:  5xSTS: 14.06 seconds (marked improvement).      11/8: 5xSTS: 12.48 seconds  (improvement).      06/24/22: 9.89 seconds (marked improvement).    Subjective:  07/19/22:   Pt. Arrived to therapy reporting L ant. Hip pain of 3/10 on NPS. Pt. Reports she had 7/10 L ant. Hip pain following the previous treatment session that slowly decreased with time. Pt. Reports difficulty with entering her car due to the amount of hip flexion required. Pt. States she was able to go the the gym to ride the bike 1x since last treatment session with minimal pain.   Treatment:      There.ex.:     Nustep L4 10 min. B UE/LE at seat position 9 (consistent cadence)- 0.50  miles    Standing marches/hamstring curls  with pt. using mod. UE support bar in front. 1x10 reps each. No pain increase.   Hip abd./ext. with mod. UE support in front 1 x 10 each. Pt. Limited by endurance but had no increase in pain.   Lat. Step over with exaggerated ER to train entering her car in //-bars Mod. UE assist.  SBA for cuing.  No LOB. Rest breaks needed.    Functional entering and exiting of car with pt. In parking lot. Pt. Encouraged to adjust drivers seat to allow more space when entering car and then readjusting once she is situated appropriately.   Discussed HEP/ gym based ex. Program. PT encourages pool exercise at local [employer] to decrease force on L hip while encouraging movement.   PATIENT EDUCATION:  Education details: Access Code: [phone] Person educated: Patient Education method: Explanation, Demonstration, and Handouts Education comprehension: verbalized understanding and returned demonstration   HOME EXERCISE PROGRAM: Access Code: [email or web address] URL: [email or web address] Date: 11/25/2021 Prepared by: Dorcas Carrow  Exercises - Straight Leg Raise  - 1 x daily - 7 x weekly - 2 sets - 10 reps - Supine Heel Slide  - 1 x daily - 7 x weekly - 2 sets - 10 reps - Supine Hip Adduction Isometric with Ball  - 1 x daily - 7 x weekly - 2 sets - 10 reps - Hooklying Gluteal Sets  - 1 x daily - 7 x weekly - 2 sets - 10 reps - Seated Heel Raise  - 1 x daily - 7 x weekly - 2 sets - 10 reps -  Seated Heel Toe Raises  - 1 x daily - 7 x weekly - 2 sets - 10 reps  Access Code: [email or web address] URL: [email or web address] Date: 06/27/2022 Prepared by: Dorene Grebe  Exercises - Straight Leg Raise  - 1 x daily - 7 x weekly - 2 sets - 10 reps - Supine Hip Adduction Isometric with Ball  - 1 x daily - 7 x weekly - 2 sets - 10 reps - Hooklying Gluteal Sets  - 1 x daily - 7 x weekly - 2 sets - 10 reps - Standing Hip Abduction  - 1 x daily - 7 x weekly - 2 sets - 12 reps - Mini Squat with Counter Support  - 1 x  daily - 7 x weekly - 2 sets - 12 reps  ASSESSMENT:  CLINICAL IMPRESSION:  Pt. Worked hard during tx. Session today with standing/ walking tasks.  Significant L antalgic gait pattern with increase standing/ wt. Bearing when walking in the hallway. Pt. Required cuing to have equal step lengths and have a wide BOS. Pt. Cued to have equal distribution of weight through legs but had antalgic L LE gait when attempting. Pt. required frequent rest breaks due to limitations in endurance. Mod UE support required for weight bearing activities. PT used ther ex. Treatments relatable to functional daily tasks for the patient such as entering and exiting the car. PT. continues to have pain/decreased endurance when the L hip is used as the stance leg due to pain and weakness of L hip. Pt. will continue to benefit from transition from PT to increase gym based/ aquatic ex.  Pt will benefit from further skilled PT in order to strengthen her LE's, and to progress her gait mechanics by decreasing her antalgic gait. Pt. Will benefit from an increase in strengthening and endurance exercises of hip and gluteal muscles to continue improvement of gait & functional activities.   OBJECTIVE IMPAIRMENTS Abnormal gait, decreased activity tolerance, decreased balance, decreased endurance, decreased mobility, difficulty walking, decreased ROM, decreased strength, hypomobility, impaired flexibility, impaired sensation, improper body mechanics, postural dysfunction, obesity, and pain.   ACTIVITY LIMITATIONS carrying, lifting, bending, sitting, standing, squatting, sleeping, stairs, transfers, bed mobility, dressing, and locomotion level  PARTICIPATION LIMITATIONS: cleaning, laundry, driving, shopping, community activity, occupation, and yard work  PERSONAL FACTORS Age, Education, and Past/current experiences are also affecting patient's functional outcome.   REHAB POTENTIAL: Good  CLINICAL DECISION MAKING: Evolving/moderate  complexity  EVALUATION COMPLEXITY: Moderate   GOALS: Goals reviewed with patient? Yes  SHORT TERM GOALS: Target date: 07/22/22  Pt will ambulate through the clinic with a symmetrical gait pattern, with equal step length and decreased pain for > 100 ft.  Baseline: Asymmetrical gait - increased L step length and decreased R step length Goal status: Partially met   LONG TERM GOALS: Target date: 08/19/22  Pt will increase FOTO score to > 58 Baseline: 9/7: 52 (no significant changes).  11/8: 49 (slight regression secondary to increase L anterior hip pain with stair climbing/ increase distance walked.  1/16: 46 (continued regression/ pt. Considering THA) Goal status: Not met  2.  Pt will decrease pain level to < 2/10 during all ADLs, in order to efficiently accomplish activities with decreased pain and increased independence Baseline:  Goal status: Partially met  3.  Pt will increase Active L knee flexion ROM to > 120 degrees  Baseline: 99, 106 passive.  8/8: 118 deg.  10/25: 118 deg.  Goal status: Partially met  PLAN: PT FREQUENCY: 1x/week  PT DURATION: 8 weeks  PLANNED INTERVENTIONS: Therapeutic exercises, Therapeutic activity, Neuromuscular re-education, Balance training, Gait training, Patient/Family education, Joint mobilization, Stair training, Aquatic Therapy, Cryotherapy, Moist heat, and Manual therapy.  PLAN FOR NEXT SESSION:  More standing dynamic ex., functional activity conditioning (walking, steps)  Junior Kenedy B. Artis Flock, SPT Cammie Mcgee, PT, DPT # [phone] 07/19/2022, 12:03 PM  [facility] [address] [place], [place], [address] Phone: [phone]   Fax:  [phone]

## 2022-07-21 ENCOUNTER — Encounter: Payer: BC Managed Care – PPO | Admitting: Physical Therapy

## 2022-07-26 ENCOUNTER — Encounter: Payer: Self-pay | Admitting: Physical Therapy

## 2022-07-26 ENCOUNTER — Ambulatory Visit: Payer: Medicaid Other | Attending: Trauma Surgery | Admitting: Physical Therapy

## 2022-07-26 DIAGNOSIS — R29898 Other symptoms and signs involving the musculoskeletal system: Secondary | ICD-10-CM | POA: Diagnosis not present

## 2022-07-26 DIAGNOSIS — R2689 Other abnormalities of gait and mobility: Secondary | ICD-10-CM | POA: Insufficient documentation

## 2022-07-26 DIAGNOSIS — M6281 Muscle weakness (generalized): Secondary | ICD-10-CM | POA: Diagnosis not present

## 2022-07-26 DIAGNOSIS — M5459 Other low back pain: Secondary | ICD-10-CM | POA: Diagnosis not present

## 2022-07-26 DIAGNOSIS — R269 Unspecified abnormalities of gait and mobility: Secondary | ICD-10-CM | POA: Diagnosis not present

## 2022-07-26 DIAGNOSIS — M25652 Stiffness of left hip, not elsewhere classified: Secondary | ICD-10-CM | POA: Diagnosis not present

## 2022-07-26 DIAGNOSIS — M25552 Pain in left hip: Secondary | ICD-10-CM | POA: Diagnosis not present

## 2022-07-26 NOTE — Therapy (Signed)
OUTPATIENT PHYSICAL THERAPY THORACOLUMBAR TREATMENT  Patient Name: Yesenia Stevens MRN: [id number] DOB:28-Oct-1965, 57 y.o., female Today's Date: 07/26/2022   PT End of Session - 07/26/22 1116     Visit Number 44    Number of Visits 47    Date for PT Re-Evaluation 08/19/22    PT Start Time 1116    PT Stop Time 1207    PT Time Calculation (min) 51 min    Activity Tolerance Patient tolerated treatment well;No increased pain;Patient limited by fatigue    Behavior During Therapy [facility] for tasks assessed/performed             Past Medical History:  Diagnosis Date   Diabetes mellitus without complication (HCC)    Hypertension    Past Surgical History:  Procedure Laterality Date   CESAREAN SECTION     CHOLECYSTECTOMY     COLONOSCOPY WITH PROPOFOL N/A 12/10/2019   Procedure: COLONOSCOPY WITH PROPOFOL;  Surgeon: Wyline Mood, MD;  Location: [facility] ENDOSCOPY;  Service: Gastroenterology;  Laterality: N/A;   COLONOSCOPY WITH PROPOFOL N/A 12/24/2019   Procedure: COLONOSCOPY WITH PROPOFOL;  Surgeon: Wyline Mood, MD;  Location: [facility] ENDOSCOPY;  Service: Gastroenterology;  Laterality: N/A;   FRACTURE SURGERY     Patient Active Problem List   Diagnosis Date Noted   Hyperlipidemia associated with type 2 diabetes mellitus (HCC) 09/22/2021   Displaced fracture of base of neck of right femur, sequela 07/16/2021   Closed fracture of posterior wall of left acetabulum with routine healing 06/16/2021   Palpitations 03/19/2020   Diarrhea 03/19/2020   Premature atrial contractions 08/20/2018   Morbid (severe) obesity due to excess calories (HCC) 08/01/2018   Shortness of breath 07/26/2018   Chronic cough 03/22/2018   Uncomplicated type 2 diabetes mellitus (HCC) 03/22/2018   Hypertension 03/22/2018    PCP: Larae Grooms, NP   REFERRING PROVIDER: Ernst Bowler, MD  REFERRING DIAG:  Diagnosis  S32.15XD (ICD-10-CM) - Type 2 fracture of sacrum, subsequent encounter for fracture with  routine healing  S32.422D (ICD-10-CM) - Displaced fracture of posterior wall of left acetabulum, subsequent encounter for fracture with routine healing    Rationale for Evaluation and Treatment Rehabilitation  THERAPY DIAG:  Stiffness of left hip joint  Other low back pain  Gait difficulty  Pain in left hip  Balance problems  Muscle weakness (generalized)  ONSET DATE: 05/28/21  SUBJECTIVE:                                                                                                                                                                                           SUBJECTIVE STATEMENT: 07/05/22:  Pt. Arrived to  therapy reporting L ant. Hip pain of 3/10 on NPS.  Pt. Reports returning to gym pool exercise 2x since last therapy session. Pt. Reported RPE of 6/7 during walking/gait based activities.   PERTINENT HISTORY:  Hx of L hip dislocation, and screws placed in lower back after her injury. Pt left hospital in a walker, and utilized a WC originally during [facility]. Pt has decreased ankle DF in L ankle, and pain in her lateral foot / 5th metatarsal. Pt had L knee scope, and has tenderness/bruising in L LE along tibia.  PAIN:  Are you having pain? NPRS scale: 4-5/10 Pain location: L hip (anterior) Pain description: popping and dull/achy Aggravating factors: standing, bending forward (especially left) Relieving factors: sitting down, tylenol PRN   PRECAUTIONS: Anterior hip, Posterior hip, Knee, and Fall  WEIGHT BEARING RESTRICTIONS no  FALLS:  Has patient fallen in last 6 months? Yes. Number of falls 1  LIVING ENVIRONMENT: Lives with: lives with their family Lives in: House/apartment Stairs: yes - 2 to get into home with raining Has following equipment at home: Quad cane large base and Wheelchair (manual)  OCCUPATION:  Pt is currently on long term disability. Previously worked at [employer] prior to injury. Would like to get back to work if the company can find a  position that is more stationary.  PLOF: Independent  PATIENT GOALS Pt wants to alleviate pain, become more mobile, strengthen back musculature, strengthen LEs   OBJECTIVE:   DIAGNOSTIC FINDINGS:  LUMBAR SPINE - 2-3 VIEW   COMPARISON:  MRI 09/28/2009   FINDINGS: 4-5 mm retrolisthesis L5-S1, slightly progressive since prior examination, possibly degenerative in nature. Otherwise normal lumbar lordosis. No acute fracture of the lumbar spine. Vertebral body height is preserved. Intervertebral disc space narrowing and endplate remodeling at T0-6 and L5-S1 is in keeping with mild degenerative disc disease at these levels. Paraspinal soft tissues are unremarkable. Right sacroiliac arthrodesis has been performed with 2 partially threaded screws.   IMPRESSION: No acute fracture or traumatic listhesis.   Degenerative changes L4-S1 with 5 mm retrolisthesis L5-S1.     Electronically Signed   By: Fidela Salisbury M.D.   On: 09/17/2021 22:13  PATIENT SURVEYS:  FOTO 52 ; 61  SCREENING FOR RED FLAGS: Bowel or bladder incontinence: No Spinal tumors: No Cauda equina syndrome: No Compression fracture: No Abdominal aneurysm: No  COGNITION:  Overall cognitive status: Within functional limits for tasks assessed     SENSATION: Not tested - patient reports altered sensation in L LE/foot  POSTURE: rounded shoulders, forward head, and weight shift left  PALPATION: Moderate L medial lower leg tenderness with palpation  LUMBAR ROM:   Active  AROM  eval  Flexion 50% limited (compensation to R)  Extension 75% limited (pain)  Right lateral flexion   Left lateral flexion   Right rotation 50% limited  Left rotation 25% limited   (Blank rows = not tested)  LOWER EXTREMITY ROM:     PROM/AROM Right eval Left eval  Hip flexion WFL 119/ 90 deg.  Hip extension    Hip abduction [facility]  Hip adduction    Hip internal rotation    Hip external rotation    Knee flexion WFL 106/ 99  deg.  Knee extension    Ankle dorsiflexion WFL Limited by pain  Ankle plantarflexion WFL Limited by pain  Ankle inversion    Ankle eversion     (Blank rows = not tested)  LOWER EXTREMITY MMT:    MMT Right  eval Left eval  Hip flexion 4/5 3/5  Hip extension    Hip abduction 4/5 3/5  Hip adduction 4/5 4/5  Hip internal rotation 5/5 unable  Hip external rotation 5/5 3/5  Knee flexion 5/5 3/5 (pain)  Knee extension 5/5 4/5  Ankle dorsiflexion 4+/5 3/5  Ankle plantarflexion 5/5 4/5  Ankle inversion    Ankle eversion     (Blank rows = not tested)  FUNCTIONAL TESTS:  5 times sit to stand: 25.4 seconds .  9/21:  14.06 seconds (marked improvement).    GAIT: Distance walked: in clinic Assistive device utilized: Quad cane small base Level of assistance: Modified independence Comments: R sided Trendelenburg.  L antalgic gait pattern with decrease stance on L with short R LE step length/ shuffling.  No arm swing. Walks with wide base of support, with mild supination of B feet. Pt had limited endurance, and significantly limited L hip flexion and DF to clear her foot during swing phase.     01/25/22: 5xSTS: 15.94 sec./ 14.94 sec.  (Marked improvement).      9/21:  5xSTS: 14.06 seconds (marked improvement).      11/8: 5xSTS: 12.48 seconds  (improvement).      06/24/22: 9.89 seconds (marked improvement).    Subjective:  07/26/22:   Pt. Arrived to therapy reporting L ant. Hip pain of 3/10 on NPS. Pt. Reports 3/10 L ant. Hip pain during the . Pt. Reports difficulty with entering her car due to the amount of hip flexion required. Pt. States she was able to go the gym to do water aerobics 1x since last treatment with no increase in pain remaining at 3/10 L ant. Hip pain on NPS.   Treatment:      There.ex.:     6 MWT - 548 ft. - slight antalgic gait, use of SPC. Pt. Stated minimal increase in pain and required minimal standing breaks.   5xSTS - 12.49 sec. Hands on knees to push up.    B standing marches/hamstring curls with pt. using mod. UE support bar in front. 2x10 reps each. No pain increase.   Hip abd./ext. with mod. UE support in front 1 x 10 each. Pt. Limited by endurance with slight increase in pain.   Seated ball squeezes to strengthen adductor muscles. 2x15.   B calf raises 2x15 in //-bars for UE support  Discussed HEP/ gym based ex. Program. PT encourages pool exercise at local [employer] to decrease force on L hip while encouraging movement.   PATIENT EDUCATION:  Education details: Access Code: [phone] Person educated: Patient Education method: Explanation, Demonstration, and Handouts Education comprehension: verbalized understanding and returned demonstration   HOME EXERCISE PROGRAM: Access Code: [email or web address] URL: [email or web address] Date: 11/25/2021 Prepared by: Dorene Grebe  Exercises - Straight Leg Raise  - 1 x daily - 7 x weekly - 2 sets - 10 reps - Supine Heel Slide  - 1 x daily - 7 x weekly - 2 sets - 10 reps - Supine Hip Adduction Isometric with Ball  - 1 x daily - 7 x weekly - 2 sets - 10 reps - Hooklying Gluteal Sets  - 1 x daily - 7 x weekly - 2 sets - 10 reps - Seated Heel Raise  - 1 x daily - 7 x weekly - 2 sets - 10 reps - Seated Heel Toe Raises  - 1 x daily - 7 x weekly - 2 sets - 10 reps  Access Code: [email or web address] URL: [email or web address] Date: 06/27/2022 Prepared by:  Dorcas Carrow  Exercises - Straight Leg Raise  - 1 x daily - 7 x weekly - 2 sets - 10 reps - Supine Hip Adduction Isometric with Ball  - 1 x daily - 7 x weekly - 2 sets - 10 reps - Hooklying Gluteal Sets  - 1 x daily - 7 x weekly - 2 sets - 10 reps - Standing Hip Abduction  - 1 x daily - 7 x weekly - 2 sets - 12 reps - Mini Squat with Counter Support  - 1 x daily - 7 x weekly - 2 sets - 12 reps  ASSESSMENT:  CLINICAL IMPRESSION:  Pt. Worked hard during tx. Session today with standing/ walking tasks. PT performed 6 MWT on pt. Recording  548 ft. of distance with use of SPC. Pt. Showed marked increase in endurance in 6 MWT using SPC. Pt. Has significant L antalgic gait pattern with any there ex. Involving single leg stance. Pt. Required cuing to have equal step lengths, have correct spinal posture, and have a wide BOS during 6 MWT. Pt. Cued to have equal distribution of weight through legs. Pt. required frequent rest breaks due to limitations in endurance. Mod UE support required for single leg weight bearing activities. PT. continues to have pain/decreased endurance when the L hip is used as the stance leg due to pain and weakness of L hip. Pt. will continue to benefit from transition from PT to increase gym based/ aquatic ex.  Pt will benefit from further skilled PT in order to strengthen her LE's, and to progress her gait mechanics by decreasing her antalgic gait. Pt. Will benefit from an increase in strengthening and endurance exercises of hip and gluteal muscles to continue improvement of gait & functional activities.   OBJECTIVE IMPAIRMENTS Abnormal gait, decreased activity tolerance, decreased balance, decreased endurance, decreased mobility, difficulty walking, decreased ROM, decreased strength, hypomobility, impaired flexibility, impaired sensation, improper body mechanics, postural dysfunction, obesity, and pain.   ACTIVITY LIMITATIONS carrying, lifting, bending, sitting, standing, squatting, sleeping, stairs, transfers, bed mobility, dressing, and locomotion level  PARTICIPATION LIMITATIONS: cleaning, laundry, driving, shopping, community activity, occupation, and yard work  PERSONAL FACTORS Age, Education, and Past/current experiences are also affecting patient's functional outcome.   REHAB POTENTIAL: Good  CLINICAL DECISION MAKING: Evolving/moderate complexity  EVALUATION COMPLEXITY: Moderate   GOALS: Goals reviewed with patient? Yes  SHORT TERM GOALS: Target date: 07/22/22  Pt will ambulate through the clinic with a  symmetrical gait pattern, with equal step length and decreased pain for > 100 ft.  Baseline: Asymmetrical gait - increased L step length and decreased R step length Goal status: MET   LONG TERM GOALS: Target date: 08/19/22  Pt will increase FOTO score to > 58 Baseline: 9/7: 52 (no significant changes).  11/8: 49 (slight regression secondary to increase L anterior hip pain with stair climbing/ increase distance walked.  1/16: 46 (continued regression/ pt. Considering THA) Goal status: Not met  2.  Pt will decrease pain level to < 2/10 during all ADLs, in order to efficiently accomplish activities with decreased pain and increased independence Baseline:  Goal status: Partially met  3.  Pt will increase Active L knee flexion ROM to > 120 degrees  Baseline: 99, 106 passive.  8/8: 118 deg.  10/25: 118 deg.  Goal status: Partially met  PLAN: PT FREQUENCY: 1x/week  PT DURATION: 8 weeks  PLANNED INTERVENTIONS: Therapeutic exercises, Therapeutic activity, Neuromuscular re-education, Balance training, Gait training, Patient/Family education, Joint mobilization, Stair training, Aquatic  Therapy, Cryotherapy, Moist heat, and Manual therapy.  PLAN FOR NEXT SESSION:  More standing dynamic ex., functional activity conditioning (walking, steps)  Envy Meno B. Rogers Blocker, SPT Pura Spice, PT, DPT # [phone] 07/26/2022, 12:48 PM  [facility] [address] [place], [place], [address] Phone: [phone]   Fax:  [phone]

## 2022-07-27 DIAGNOSIS — H35033 Hypertensive retinopathy, bilateral: Secondary | ICD-10-CM | POA: Diagnosis not present

## 2022-07-27 DIAGNOSIS — E113493 Type 2 diabetes mellitus with severe nonproliferative diabetic retinopathy without macular edema, bilateral: Secondary | ICD-10-CM | POA: Diagnosis not present

## 2022-07-27 LAB — HM DIABETES EYE EXAM

## 2022-07-28 ENCOUNTER — Encounter: Payer: Self-pay | Admitting: Nurse Practitioner

## 2022-07-28 ENCOUNTER — Encounter: Payer: BC Managed Care – PPO | Admitting: Physical Therapy

## 2022-08-02 ENCOUNTER — Encounter: Payer: BC Managed Care – PPO | Admitting: Physical Therapy

## 2022-08-04 ENCOUNTER — Ambulatory Visit (INDEPENDENT_AMBULATORY_CARE_PROVIDER_SITE_OTHER): Payer: Medicaid Other | Admitting: Physician Assistant

## 2022-08-04 ENCOUNTER — Ambulatory Visit: Payer: Medicaid Other | Admitting: Physical Therapy

## 2022-08-04 ENCOUNTER — Encounter: Payer: BC Managed Care – PPO | Admitting: Physical Therapy

## 2022-08-04 ENCOUNTER — Encounter: Payer: Self-pay | Admitting: Physician Assistant

## 2022-08-04 VITALS — BP 124/74 | HR 90 | Temp 98.2°F | Ht 64.02 in | Wt 281.7 lb

## 2022-08-04 DIAGNOSIS — S63501A Unspecified sprain of right wrist, initial encounter: Secondary | ICD-10-CM | POA: Diagnosis not present

## 2022-08-04 NOTE — Progress Notes (Signed)
Acute Office Visit   Patient: Yesenia Stevens   DOB: 1965/09/05   57 y.o. Female  MRN: [id number] Visit Date: 08/04/2022  Today's healthcare provider: Dani Gobble Brylyn Novakovich, PA-C  Introduced myself to the patient as a Journalist, newspaper and provided education on APPs in clinical practice.    Chief Complaint  Patient presents with   Arm Pain    Right lower arm pain. Started last week   Subjective    Arm Pain  Pertinent negatives include no numbness.   HPI     Arm Pain    Additional comments: Right lower arm pain. Started last week      Last edited by Jerelene Redden, CMA on 08/04/2022  1:31 PM.       Right forearm pain  Onset: sudden  Duration: last week but seemed to resolve for a few days, then returned  Associated symptoms: denies numbness or tingling in hand or fingers, denies weakness or decreased ROM   Pain level and character: 3-4/10 achy in character  Location: proximal to wrist  Radiation: radiates proximal to wrist and up forearm   Recent injuries or trauma: denies injuries or falls recently   Interventions: None, she uses Ibuprofen regularly for other body aches and pains  Alleviating: nothing  Aggravating: flexion of right wrist and hand, palpation or accidentally jarring it  She walks with a cane and uses cane on right side   Medications: Outpatient Medications Prior to Visit  Medication Sig   albuterol (PROVENTIL HFA;VENTOLIN HFA) 108 (90 Base) MCG/ACT inhaler Inhale 1-2 puffs into the lungs every 6 (six) hours as needed for wheezing or shortness of breath.   amLODipine (NORVASC) 10 MG tablet Take 1 tablet (10 mg total) by mouth daily.   empagliflozin (JARDIANCE) 10 MG TABS tablet Take 1 tablet (10 mg total) by mouth daily before breakfast.   glipiZIDE (GLUCOTROL) 10 MG tablet Take 1 tablet (10 mg total) by mouth 2 (two) times daily before a meal.   hydrochlorothiazide (HYDRODIURIL) 25 MG tablet Take 1 tablet (25 mg total) by mouth daily.   hydrocortisone  2.5 % cream SMARTSIG:sparingly Topical Twice Daily   ibuprofen (ADVIL) 600 MG tablet Take 1 tablet (600 mg total) by mouth every 6 (six) hours as needed.   metFORMIN (GLUCOPHAGE) 1000 MG tablet Take 1 tablet (1,000 mg total) by mouth 2 (two) times daily with a meal.   rosuvastatin (CRESTOR) 5 MG tablet Take 1 tablet (5 mg total) by mouth daily.   tirzepatide [facility]) 5 MG/0.5ML Pen Inject 5 mg into the skin once a week.   valsartan (DIOVAN) 80 MG tablet Take 1 tablet (80 mg total) by mouth daily.   No facility-administered medications prior to visit.    Review of Systems  Musculoskeletal:        Right wrist and forearm pain   Neurological:  Negative for tremors, weakness and numbness.       Objective    BP 124/74   Pulse 90   Temp 98.2 F (36.8 C) (Oral)   Ht 5' 4.02" (1.626 m)   Wt 281 lb 11.2 oz (127.8 kg)   LMP 08/20/2015 (Approximate) Comment: denies preg  BMI 48.33 kg/m    Physical Exam Vitals reviewed.  Constitutional:      General: She is awake.     Appearance: Normal appearance. She is well-developed and well-groomed.  HENT:     Head: Normocephalic and atraumatic.  Musculoskeletal:  Right forearm: Tenderness present. No swelling, edema or deformity.     Left forearm: Normal.     Right wrist: No swelling, deformity, tenderness, bony tenderness or snuff box tenderness. Normal range of motion. Normal pulse.     Left wrist: Normal.     Comments: Pain with right wrist flexion and supination but ROM is intact and symmetrical with left wrist and forearm  Negative Finkelstein testing and negative Tinel testing on both sides  Strength is 5/5 at hands, fingers and wrist bilaterally   Neurological:     General: No focal deficit present.     Mental Status: She is alert and oriented to person, place, and time. Mental status is at baseline.  Psychiatric:        Mood and Affect: Mood normal.        Behavior: Behavior normal. Behavior is cooperative.        Thought  Content: Thought content normal.        Judgment: Judgment normal.       No results found for any visits on 08/04/22.  Assessment & Plan      No follow-ups on file.      Problem List Items Addressed This Visit   None Visit Diagnoses     Forearm sprain, right, initial encounter    -  Primary Acute, new concern Reports right forearm and wrist pain intermittently for the past week or so  Suspect likely sprain of forearm at this time given overall normal PE and intact ROM Recommend conservative management at this time with Tylenol and Ibuprofen, rigid wrist brace, light stretches and warm compresses  Follow up as needed for persistent or progressing symptoms        No follow-ups on file.   I, Anniece Bleiler E Ruthene Methvin, PA-C, have reviewed all documentation for this visit. The documentation on 08/05/22 for the exam, diagnosis, procedures, and orders are all accurate and complete.   Talitha Givens, MHS, PA-C [facility]

## 2022-08-04 NOTE — Patient Instructions (Addendum)
I recommend using a rigid wrist brace to keep your wrist from bending, warm compresses, and some gentle stretching once the pain and discomfort start to improve   You can continue to take the Ibuprofen for pain management   If its not improving or getting worse over the next 3-4 weeks please let us know and we will schedule imaging

## 2022-08-09 ENCOUNTER — Ambulatory Visit: Payer: Medicaid Other

## 2022-08-09 ENCOUNTER — Encounter: Payer: Self-pay | Admitting: Nurse Practitioner

## 2022-08-09 ENCOUNTER — Ambulatory Visit: Payer: Medicaid Other | Admitting: Nurse Practitioner

## 2022-08-09 VITALS — BP 120/75 | HR 89 | Temp 98.9°F | Wt 283.5 lb

## 2022-08-09 DIAGNOSIS — E785 Hyperlipidemia, unspecified: Secondary | ICD-10-CM

## 2022-08-09 DIAGNOSIS — I152 Hypertension secondary to endocrine disorders: Secondary | ICD-10-CM

## 2022-08-09 DIAGNOSIS — M25552 Pain in left hip: Secondary | ICD-10-CM

## 2022-08-09 DIAGNOSIS — M5459 Other low back pain: Secondary | ICD-10-CM | POA: Diagnosis not present

## 2022-08-09 DIAGNOSIS — R2689 Other abnormalities of gait and mobility: Secondary | ICD-10-CM

## 2022-08-09 DIAGNOSIS — R29898 Other symptoms and signs involving the musculoskeletal system: Secondary | ICD-10-CM

## 2022-08-09 DIAGNOSIS — R269 Unspecified abnormalities of gait and mobility: Secondary | ICD-10-CM

## 2022-08-09 DIAGNOSIS — E1169 Type 2 diabetes mellitus with other specified complication: Secondary | ICD-10-CM

## 2022-08-09 DIAGNOSIS — M25652 Stiffness of left hip, not elsewhere classified: Secondary | ICD-10-CM | POA: Diagnosis not present

## 2022-08-09 DIAGNOSIS — M6281 Muscle weakness (generalized): Secondary | ICD-10-CM | POA: Diagnosis not present

## 2022-08-09 DIAGNOSIS — E1159 Type 2 diabetes mellitus with other circulatory complications: Secondary | ICD-10-CM | POA: Diagnosis not present

## 2022-08-09 DIAGNOSIS — E119 Type 2 diabetes mellitus without complications: Secondary | ICD-10-CM

## 2022-08-09 NOTE — Progress Notes (Signed)
BP 120/75 (BP Location: Left Arm, Cuff Size: Normal)   Pulse 89   Temp 98.9 F (37.2 C) (Oral)   Wt 283 lb 8 oz (128.6 kg)   LMP 08/20/2015 (Approximate) Comment: denies preg  SpO2 98%   BMI 48.64 kg/m    Subjective:    Patient ID: Yesenia Stevens, female    DOB: 1965/06/26, 57 y.o.   MRN: [id number]  HPI: Yesenia Stevens is a 57 y.o. female  [place], FNP.  ASSESSMENT AND PLAN OF CARE REVIEWED WITH STUDENT, AGREE WITH ABOVE FINDINGS AND PLAN.   Chief Complaint  Patient presents with   Diabetes   Hyperlipidemia   Hypertension    Yesenia Stevens is doing physical therapy once weekly, and goes to the [employer] x1 weekly. She is eating x2 meals daily.   HYPERTENSION / HYPERLIPIDEMIA Taking amlodipine, HCTZ, Valsartan, and Rosuvastatin daily.  Satisfied with current treatment? no Duration of hypertension: chronic BP monitoring frequency: not checking BP range: N/a BP medication side effects: no Past BP meds:  Duration of hyperlipidemia: chronic Cholesterol medication side effects: no Cholesterol supplements: none Past cholesterol medications:  Medication compliance: excellent compliance Aspirin: no Recent stressors: no Recurrent headaches: no Visual changes: no Palpitations: no Dyspnea: no Chest pain: no Lower extremity edema: no Dizzy/lightheaded: no   The 10-year ASCVD risk score (Arnett DK, et al., 2019) is: 9.8%   Values used to calculate the score:     Age: 63 years     Sex: Female     Is Non-Hispanic African American: Yes     Diabetic: Yes     Tobacco smoker: No     Systolic Blood Pressure: 123456 mmHg     Is BP treated: Yes     HDL Cholesterol: 47 mg/dL     Total Cholesterol: 196 mg/dL   DIABETES Last A1c 6.8%. She is taking Metformin 553m x2 day which was originally ordered for 10051mx2 day, glipizide, and Mounjaro weekly.  Hypoglycemic episodes:no Polydipsia/polyuria: no Visual disturbance: no Chest pain:  no Paresthesias: no Glucose Monitoring: no  Accucheck frequency: Not Checking Taking Insulin?: no Blood Pressure Monitoring: not checking Retinal Examination: Up to Date Foot Exam: Up to Date Diabetic Education: Completed Influenza:  Not interested Aspirin: no   Relevant past medical, surgical, family and social history reviewed and updated as indicated. Interim medical history since our last visit reviewed. Allergies and medications reviewed and updated.  Review of Systems  Eyes:  Negative for visual disturbance.  Respiratory:  Negative for shortness of breath.   Cardiovascular:  Negative for chest pain, palpitations and leg swelling.  Endocrine: Negative for polydipsia, polyphagia and polyuria.  Neurological:  Negative for dizziness, light-headedness and headaches.    Per HPI unless specifically indicated above     Objective:    BP 120/75 (BP Location: Left Arm, Cuff Size: Normal)   Pulse 89   Temp 98.9 F (37.2 C) (Oral)   Wt 283 lb 8 oz (128.6 kg)   LMP 08/20/2015 (Approximate) Comment: denies preg  SpO2 98%   BMI 48.64 kg/m   Wt Readings from Last 3 Encounters:  08/09/22 283 lb 8 oz (128.6 kg)  08/04/22 281 lb 11.2 oz (127.8 kg)  07/07/22 282 lb 9.6 oz (128.2 kg)    Physical Exam Vitals and nursing note reviewed.  Constitutional:      General: She is awake. She is not in acute distress.    Appearance: She is well-developed and well-groomed. She is  obese. She is not ill-appearing.  HENT:     Head: Normocephalic and atraumatic.     Right Ear: Hearing and external ear normal. No drainage.     Left Ear: Hearing and external ear normal. No drainage.     Nose: Nose normal.  Eyes:     General: Lids are normal.        Right eye: No discharge.        Left eye: No discharge.     Conjunctiva/sclera: Conjunctivae normal.  Cardiovascular:     Rate and Rhythm: Normal rate and regular rhythm.     Heart sounds: Normal heart sounds, S1 normal and S2 normal. No murmur  heard.    No gallop.  Pulmonary:     Effort: No accessory muscle usage or respiratory distress.     Breath sounds: Normal breath sounds.  Musculoskeletal:        General: Normal range of motion.     Right lower leg: No edema.     Left lower leg: No edema.  Skin:    General: Skin is warm and dry.     Capillary Refill: Capillary refill takes less than 2 seconds.  Neurological:     Mental Status: She is alert and oriented to person, place, and time.  Psychiatric:        Attention and Perception: Attention normal.        Mood and Affect: Mood normal.        Speech: Speech normal.        Behavior: Behavior normal. Behavior is cooperative.        Thought Content: Thought content normal.     Results for orders placed or performed in visit on 07/28/22  HM DIABETES EYE EXAM  Result Value Ref Range   HM Diabetic Eye Exam Retinopathy (A) No Retinopathy      Assessment & Plan:   Problem List Items Addressed This Visit       Cardiovascular and Mediastinum   Hypertension associated with diabetes ([place])    Chronic, stable. Continue taking Amlodipine 19m, HCTZ 295m and Valsartan 8019maily. Recheck in office BP 120/75 today. Recommend to check BP at pharmacy with no cost and keep log of readings. Encouraged DASH diet. Return in 6 months.       Relevant Orders   Comprehensive metabolic panel     Endocrine   Uncomplicated type 2 diabetes mellitus (HCC)    Chronic, stable. Last A1c 6.8%, at goal of <7. Start taking Metformin 1000m72m day instead of 500mg52may. A1c done today. Continue taking Glipizide 10mg 49mMounjaro 5mg (w24mly). Recommend diet of fruits and vegetables, and increasing physical activity of 30 mins x5/day. Return in 6 months.       Relevant Orders   Comprehensive metabolic panel   Hemoglobin A1c   Hyperlipidemia associated with type 2 diabetes mellitus (HCC) - [place]    Chronic, stable. Last LDL (130) 02/2022.  Continue taking Rosuvastatin 5mg dai12m Lipid panel  done today. ASCVD risk score 9.8% which puts her at high risk for CVD. Recommend diet and exercise to help with this. Will recommend increasing statin dose when lab results return. Return in 6 months.       Relevant Orders   Lipid Panel w/o Chol/HDL Ratio     Follow up plan: Return in about 6 months (around 02/07/2023) for HTN/HLD and DM2.

## 2022-08-09 NOTE — Therapy (Signed)
OUTPATIENT PHYSICAL THERAPY THORACOLUMBAR TREATMENT  Patient Name: Yesenia Stevens MRN: [id number] DOB:24-Jul-1965, 57 y.o., female Today's Date: 08/09/2022   PT End of Session - 08/09/22 1219     Visit Number 45    Number of Visits 24    Date for PT Re-Evaluation 08/19/22    PT Start Time 1115    PT Stop Time 1159    PT Time Calculation (min) 44 min    Activity Tolerance Patient tolerated treatment well;Patient limited by pain    Behavior During Therapy [facility] for tasks assessed/performed              Past Medical History:  Diagnosis Date   Diabetes mellitus without complication ([place])    Hypertension    Past Surgical History:  Procedure Laterality Date   CESAREAN SECTION     CHOLECYSTECTOMY     COLONOSCOPY WITH PROPOFOL N/A 12/10/2019   Procedure: COLONOSCOPY WITH PROPOFOL;  Surgeon: [name], MD;  Location: [facility] ENDOSCOPY;  Service: Gastroenterology;  Laterality: N/A;   COLONOSCOPY WITH PROPOFOL N/A 12/24/2019   Procedure: COLONOSCOPY WITH PROPOFOL;  Surgeon: [name], MD;  Location[facility] ENDOSCOPY;  Service: Gastroenterology;  Laterality: N/A;   FRACTURE SURGERY     Patient Active Problem List   Diagnosis Date Noted   Hyperlipidemia associated with type 2 diabetes mellitus ([place]) 09/22/2021   Displaced fracture of base of neck of right femur, sequela 07/16/2021   Closed fracture of posterior wall of left acetabulum with routine healing 06/16/2021   Palpitations 03/19/2020   Diarrhea 03/19/2020   Premature atrial contractions 08/20/2018   Morbid (severe) obesity due to excess calories ([place]) 08/01/2018   Shortness of breath 07/26/2018   Chronic cough [id number]   Uncomplicated type 2 diabetes mellitus ([place]) 03/22/2018   Hypertension associated with diabetes ([place]) 03/22/2018    PCP: Jon Billings, NP   REFERRING PROVIDER: Katherine Mantle, MD  REFERRING DIAG:  Diagnosis  S32.15XD (ICD-10-CM) - Type 2 fracture of sacrum, subsequent encounter for  fracture with routine healing  S32.422D (ICD-10-CM) - Displaced fracture of posterior wall of left acetabulum, subsequent encounter for fracture with routine healing    Rationale for Evaluation and Treatment Rehabilitation  THERAPY DIAG:  Stiffness of left hip joint  Gait difficulty  Pain in left hip  Balance problems  Muscle weakness (generalized)  Impaired strength of hip muscles  Stiffness of left hip, not elsewhere classified  Balance problem  Other low back pain  ONSET DATE: 05/28/21  SUBJECTIVE:  SUBJECTIVE STATEMENT: 07/05/22:  Pt. Arrived to therapy reporting L ant. Hip pain of 4/10 on NPS.   PERTINENT HISTORY:  Hx of L hip dislocation, and screws placed in lower back after her injury. Pt left hospital in a walker, and utilized a WC originally during [facility]. Pt has decreased ankle DF in L ankle, and pain in her lateral foot / 5th metatarsal. Pt had L knee scope, and has tenderness/bruising in L LE along tibia.  PAIN:  Are you having pain? NPRS scale: 4-5/10 Pain location: L hip (anterior) Pain description: popping and dull/achy Aggravating factors: standing, bending forward (especially left) Relieving factors: sitting down, tylenol PRN   PRECAUTIONS: Anterior hip, Posterior hip, Knee, and Fall  WEIGHT BEARING RESTRICTIONS no  FALLS:  Has patient fallen in last 6 months Yes. Number of falls 1  LIVING ENVIRONMENT: Lives with: lives with their family Lives in: House/apartment Stairs: yes - 2 to get into home with raining Has following equipment at home: Quad cane large base and Wheelchair (manual)  OCCUPATION:  Pt is currently on long term disability. Previously worked at [employer] prior to injury. Would like to get back to work if the company can find a position that is  more stationary.  PLOF: Independent  PATIENT GOALS Pt wants to alleviate pain, become more mobile, strengthen back musculature, strengthen LEs   OBJECTIVE:   DIAGNOSTIC FINDINGS:  LUMBAR SPINE - 2-3 VIEW   COMPARISON:  MRI 09/28/2009   FINDINGS: 4-5 mm retrolisthesis L5-S1, slightly progressive since prior examination, possibly degenerative in nature. Otherwise normal lumbar lordosis. No acute fracture of the lumbar spine. Vertebral body height is preserved. Intervertebral disc space narrowing and endplate remodeling at 075-GRM and L5-S1 is in keeping with mild degenerative disc disease at these levels. Paraspinal soft tissues are unremarkable. Right sacroiliac arthrodesis has been performed with 2 partially threaded screws.   IMPRESSION: No acute fracture or traumatic listhesis.   Degenerative changes L4-S1 with 5 mm retrolisthesis L5-S1.     Electronically Signed   By: Fidela Salisbury M.D.   On: 09/17/2021 22:13  PATIENT SURVEYS:  FOTO 52 ; 61  SCREENING FOR RED FLAGS: Bowel or bladder incontinence: No Spinal tumors: No Cauda equina syndrome: No Compression fracture: No Abdominal aneurysm: No  COGNITION:  Overall cognitive status: Within functional limits for tasks assessed     SENSATION: Not tested - patient reports altered sensation in L LE/foot  POSTURE: rounded shoulders, forward head, and weight shift left  PALPATION: Moderate L medial lower leg tenderness with palpation  LUMBAR ROM:   Active  AROM  eval  Flexion 50% limited (compensation to R)  Extension 75% limited (pain)  Right lateral flexion   Left lateral flexion   Right rotation 50% limited  Left rotation 25% limited   (Blank rows = not tested)  LOWER EXTREMITY ROM:     PROM/AROM Right eval Left eval  Hip flexion WFL 119/ 90 deg.  Hip extension    Hip abduction [facility]  Hip adduction    Hip internal rotation    Hip external rotation    Knee flexion WFL 106/ 99 deg.  Knee  extension    Ankle dorsiflexion WFL Limited by pain  Ankle plantarflexion WFL Limited by pain  Ankle inversion    Ankle eversion     (Blank rows = not tested)  LOWER EXTREMITY MMT:    MMT Right eval Left eval  Hip flexion 4/5 3/5  Hip extension    Hip  abduction 4/5 3/5  Hip adduction 4/5 4/5  Hip internal rotation 5/5 unable  Hip external rotation 5/5 3/5  Knee flexion 5/5 3/5 (pain)  Knee extension 5/5 4/5  Ankle dorsiflexion 4+/5 3/5  Ankle plantarflexion 5/5 4/5  Ankle inversion    Ankle eversion     (Blank rows = not tested)  FUNCTIONAL TESTS:  5 times sit to stand: 25.4 seconds .  9/21:  14.06 seconds (marked improvement).    GAIT: Distance walked: in clinic Assistive device utilized: Quad cane small base Level of assistance: Modified independence Comments: R sided Trendelenburg.  L antalgic gait pattern with decrease stance on L with short R LE step length/ shuffling.  No arm swing. Walks with wide base of support, with mild supination of B feet. Pt had limited endurance, and significantly limited L hip flexion and DF to clear her foot during swing phase.     01/25/22: 5xSTS: 15.94 sec./ 14.94 sec.  (Marked improvement).      9/21:  5xSTS: 14.06 seconds (marked improvement).      11/8: 5xSTS: 12.48 seconds  (improvement).      06/24/22: 9.89 seconds (marked improvement).    Subjective: 08/09/22:   Pt. Arrived to therapy reporting L ant. Hip pain of 4/10 on NPS. Pt. Reports 5/10 L ant. Hip pain with activities. Pt continues to report difficulty with entering her car due to the amount of hip flexion required. " I have bad pain and medications help."  Treatment:      There.ex.:   Sci-Fit for 10 min at L 5  while discussing HEP and Pt condition and pain levels.              STS  10 reps               Squats 2 x 30 secs, Side stepping 4 x 15 ft with YTB,  squatted side stepping  with YTB 4 x 15 ft, Back stepping 2 x 77f, tandem walking in //bars, toe raises 2 x 10  reps, Standing on NBOS with EO/EC 30 secs each, Standing on foam Eo/EC 30 secs.     Not performed today.  6 MWT - 548 ft. - slight antalgic gait, use of SPC. Pt. Stated minimal increase in pain and required minimal standing breaks.   5xSTS - 12.49 sec. Hands on knees to push up.   B standing marches/hamstring curls with pt. using mod. UE support bar in front. 2x10 reps each. No pain increase.   Hip abd./ext. with mod. UE support in front 1 x 10 each. Pt. Limited by endurance with slight increase in pain.   Seated ball squeezes to strengthen adductor muscles. 2x15.   B calf raises 2x15 in //-bars for UE support  Discussed HEP/ gym based ex. Program. PT encourages pool exercise at local [employer] to decrease force on L hip while encouraging movement.   PATIENT EDUCATION:  Education details: Access Code: [email or web address] educated: Patient Education method: Explanation, Demonstration, and Handouts Education comprehension: verbalized understanding and returned demonstration   HOME EXERCISE PROGRAM: Access Code: [email or web address]: [email or web address] Date: 11/25/2021 Prepared by: MDorcas Carrow Exercises - Straight Leg Raise  - 1 x daily - 7 x weekly - 2 sets - 10 reps - Supine Heel Slide  - 1 x daily - 7 x weekly - 2 sets - 10 reps - Supine Hip Adduction Isometric with Ball  - 1 x daily - 7 x weekly - 2 sets - 10 reps -  Hooklying Gluteal Sets  - 1 x daily - 7 x weekly - 2 sets - 10 reps - Seated Heel Raise  - 1 x daily - 7 x weekly - 2 sets - 10 reps - Seated Heel Toe Raises  - 1 x daily - 7 x weekly - 2 sets - 10 reps  Access Code: [email or web address] URL: [email or web address] Date: 06/27/2022 Prepared by: Dorcas Carrow  Exercises - Straight Leg Raise  - 1 x daily - 7 x weekly - 2 sets - 10 reps - Supine Hip Adduction Isometric with Ball  - 1 x daily - 7 x weekly - 2 sets - 10 reps - Hooklying Gluteal Sets  - 1 x daily - 7 x weekly - 2 sets - 10 reps - Standing Hip  Abduction  - 1 x daily - 7 x weekly - 2 sets - 12 reps - Mini Squat with Counter Support  - 1 x daily - 7 x weekly - 2 sets - 12 reps  ASSESSMENT:  CLINICAL IMPRESSION:  Pt presented today with SPC with antalgic gait. Pt reported of increased pain with exs which is common for her. Pt's pain fluctuates between 3 to 6 and she takes meds for relief. Pt provided with rest breaks as needed to control pain. Pt demonstrates slow but steady progress and continues to demonstrate proximal weakness > distal, high fall risk and fair endurance.  Pt will benefit from continue PT interventions to address pt impairments in order to achieve pt's goal.   OBJECTIVE IMPAIRMENTS Abnormal gait, decreased activity tolerance, decreased balance, decreased endurance, decreased mobility, difficulty walking, decreased ROM, decreased strength, hypomobility, impaired flexibility, impaired sensation, improper body mechanics, postural dysfunction, obesity, and pain.   ACTIVITY LIMITATIONS carrying, lifting, bending, sitting, standing, squatting, sleeping, stairs, transfers, bed mobility, dressing, and locomotion level  PARTICIPATION LIMITATIONS: cleaning, laundry, driving, shopping, community activity, occupation, and yard work  PERSONAL FACTORS Age, Education, and Past/current experiences are also affecting patient's functional outcome.   REHAB POTENTIAL: Good  CLINICAL DECISION MAKING: Evolving/moderate complexity  EVALUATION COMPLEXITY: Moderate   GOALS: Goals reviewed with patient Yes  SHORT TERM GOALS: Target date: 07/22/22  Pt will ambulate through the clinic with a symmetrical gait pattern, with equal step length and decreased pain for > 100 ft.  Baseline: Asymmetrical gait - increased L step length and decreased R step length Goal status: MET   LONG TERM GOALS: Target date: 08/19/22  Pt will increase FOTO score to > 58 Baseline: 9/7: 52 (no significant changes).  11/8: 49 (slight regression secondary to  increase L anterior hip pain with stair climbing/ increase distance walked.  1/16: 46 (continued regression/ pt. Considering THA) Goal status: Not met  2.  Pt will decrease pain level to < 2/10 during all ADLs, in order to efficiently accomplish activities with decreased pain and increased independence Baseline:  Goal status: Partially met  3.  Pt will increase Active L knee flexion ROM to > 120 degrees  Baseline: 99, 106 passive.  8/8: 118 deg.  10/25: 118 deg.  Goal status: Partially met  PLAN: PT FREQUENCY: 1x/week  PT DURATION: 8 weeks  PLANNED INTERVENTIONS: Therapeutic exercises, Therapeutic activity, Neuromuscular re-education, Balance training, Gait training, Patient/Family education, Joint mobilization, Stair training, Aquatic Therapy, Cryotherapy, Moist heat, and Manual therapy.  PLAN FOR NEXT SESSION:  More standing dynamic ex., functional activity conditioning (walking, steps)   Joaquin Music PT DPT 12:22 PM,08/09/22  08/09/2022, 12:22 PM  [facility]  [address] [place], [place], [address] Phone: [phone]   Fax:  [phone]

## 2022-08-09 NOTE — Therapy (Signed)
OUTPATIENT PHYSICAL THERAPY THORACOLUMBAR TREATMENT  Patient Name: Yesenia Stevens MRN: [id number] DOB:05/31/1966, 57 y.o., female Today's Date: 08/09/2022   PT End of Session - 08/09/22 1219     Visit Number 45    Number of Visits 1    Date for PT Re-Evaluation 08/19/22    PT Start Time 1115    PT Stop Time 1159    PT Time Calculation (min) 44 min    Activity Tolerance Patient tolerated treatment well;Patient limited by pain    Behavior During Therapy [facility] for tasks assessed/performed              Past Medical History:  Diagnosis Date   Diabetes mellitus without complication ([place])    Hypertension    Past Surgical History:  Procedure Laterality Date   CESAREAN SECTION     CHOLECYSTECTOMY     COLONOSCOPY WITH PROPOFOL N/A 12/10/2019   Procedure: COLONOSCOPY WITH PROPOFOL;  Surgeon: [name], MD;  Location: [facility] ENDOSCOPY;  Service: Gastroenterology;  Laterality: N/A;   COLONOSCOPY WITH PROPOFOL N/A 12/24/2019   Procedure: COLONOSCOPY WITH PROPOFOL;  Surgeon: [name], MD;  Location: [facility] ENDOSCOPY;  Service: Gastroenterology;  Laterality: N/A;   FRACTURE SURGERY     Patient Active Problem List   Diagnosis Date Noted   Hyperlipidemia associated with type 2 diabetes mellitus ([place]) 09/22/2021   Displaced fracture of base of neck of right femur, sequela 07/16/2021   Closed fracture of posterior wall of left acetabulum with routine healing 06/16/2021   Palpitations 03/19/2020   Diarrhea 03/19/2020   Premature atrial contractions 08/20/2018   Morbid (severe) obesity due to excess calories ([place]) 08/01/2018   Shortness of breath 07/26/2018   Chronic cough [id number]   Uncomplicated type 2 diabetes mellitus ([place]) 03/22/2018   Hypertension associated with diabetes ([place]) 03/22/2018    PCP: Jon Billings, NP   REFERRING PROVIDER: Katherine Mantle, MD  REFERRING DIAG:  Diagnosis  S32.15XD (ICD-10-CM) - Type 2 fracture of sacrum, subsequent encounter for  fracture with routine healing  S32.422D (ICD-10-CM) - Displaced fracture of posterior wall of left acetabulum, subsequent encounter for fracture with routine healing    Rationale for Evaluation and Treatment Rehabilitation  THERAPY DIAG:  Stiffness of left hip joint  Gait difficulty  Pain in left hip  Balance problems  Muscle weakness (generalized)  Impaired strength of hip muscles  Stiffness of left hip, not elsewhere classified  Balance problem  Other low back pain  ONSET DATE: 05/28/21  SUBJECTIVE:  SUBJECTIVE STATEMENT: 07/05/22:  Pt. Arrived to therapy reporting L ant. Hip pain of 3/10 on NPS.  Pt. Reports returning to gym pool exercise 2x since last therapy session. Pt. Reported RPE of 6/7 during walking/gait based activities.   PERTINENT HISTORY:  Hx of L hip dislocation, and screws placed in lower back after her injury. Pt left hospital in a walker, and utilized a WC originally during [facility]. Pt has decreased ankle DF in L ankle, and pain in her lateral foot / 5th metatarsal. Pt had L knee scope, and has tenderness/bruising in L LE along tibia.  PAIN:  Are you having pain NPRS scale: 4-5/10 Pain location: L hip (anterior) Pain description: popping and dull/achy Aggravating factors: standing, bending forward (especially left) Relieving factors: sitting down, tylenol PRN   PRECAUTIONS: Anterior hip, Posterior hip, Knee, and Fall  WEIGHT BEARING RESTRICTIONS no  FALLS:  Has patient fallen in last 6 months Yes. Number of falls 1  LIVING ENVIRONMENT: Lives with: lives with their family Lives in: House/apartment Stairs: yes - 2 to get into home with raining Has following equipment at home: Quad cane large base and Wheelchair (manual)  OCCUPATION:  Pt is currently on long term  disability. Previously worked at [employer] prior to injury. Would like to get back to work if the company can find a position that is more stationary.  PLOF: Independent  PATIENT GOALS Pt wants to alleviate pain, become more mobile, strengthen back musculature, strengthen LEs   OBJECTIVE:   DIAGNOSTIC FINDINGS:  LUMBAR SPINE - 2-3 VIEW   COMPARISON:  MRI 09/28/2009   FINDINGS: 4-5 mm retrolisthesis L5-S1, slightly progressive since prior examination, possibly degenerative in nature. Otherwise normal lumbar lordosis. No acute fracture of the lumbar spine. Vertebral body height is preserved. Intervertebral disc space narrowing and endplate remodeling at 075-GRM and L5-S1 is in keeping with mild degenerative disc disease at these levels. Paraspinal soft tissues are unremarkable. Right sacroiliac arthrodesis has been performed with 2 partially threaded screws.   IMPRESSION: No acute fracture or traumatic listhesis.   Degenerative changes L4-S1 with 5 mm retrolisthesis L5-S1.     Electronically Signed   By: Fidela Salisbury M.D.   On: 09/17/2021 22:13  PATIENT SURVEYS:  FOTO 52 ; 61  SCREENING FOR RED FLAGS: Bowel or bladder incontinence: No Spinal tumors: No Cauda equina syndrome: No Compression fracture: No Abdominal aneurysm: No  COGNITION:  Overall cognitive status: Within functional limits for tasks assessed     SENSATION: Not tested - patient reports altered sensation in L LE/foot  POSTURE: rounded shoulders, forward head, and weight shift left  PALPATION: Moderate L medial lower leg tenderness with palpation  LUMBAR ROM:   Active  AROM  eval  Flexion 50% limited (compensation to R)  Extension 75% limited (pain)  Right lateral flexion   Left lateral flexion   Right rotation 50% limited  Left rotation 25% limited   (Blank rows = not tested)  LOWER EXTREMITY ROM:     PROM/AROM Right eval Left eval  Hip flexion WFL 119/ 90 deg.  Hip extension     Hip abduction [facility]  Hip adduction    Hip internal rotation    Hip external rotation    Knee flexion WFL 106/ 99 deg.  Knee extension    Ankle dorsiflexion WFL Limited by pain  Ankle plantarflexion WFL Limited by pain  Ankle inversion    Ankle eversion     (Blank rows = not tested)  LOWER  EXTREMITY MMT:    MMT Right eval Left eval  Hip flexion 4/5 3/5  Hip extension    Hip abduction 4/5 3/5  Hip adduction 4/5 4/5  Hip internal rotation 5/5 unable  Hip external rotation 5/5 3/5  Knee flexion 5/5 3/5 (pain)  Knee extension 5/5 4/5  Ankle dorsiflexion 4+/5 3/5  Ankle plantarflexion 5/5 4/5  Ankle inversion    Ankle eversion     (Blank rows = not tested)  FUNCTIONAL TESTS:  5 times sit to stand: 25.4 seconds .  9/21:  14.06 seconds (marked improvement).    GAIT: Distance walked: in clinic Assistive device utilized: Quad cane small base Level of assistance: Modified independence Comments: R sided Trendelenburg.  L antalgic gait pattern with decrease stance on L with short R LE step length/ shuffling.  No arm swing. Walks with wide base of support, with mild supination of B feet. Pt had limited endurance, and significantly limited L hip flexion and DF to clear her foot during swing phase.     01/25/22: 5xSTS: 15.94 sec./ 14.94 sec.  (Marked improvement).      9/21:  5xSTS: 14.06 seconds (marked improvement).      11/8: 5xSTS: 12.48 seconds  (improvement).      06/24/22: 9.89 seconds (marked improvement).    Subjective:  08/09/22:   Pt. Arrived to therapy reporting L ant. Hip pain of 3/10 on NPS. Pt. Reports 3/10 L ant. Hip pain during the 6MWT. Pt. Reports difficulty with entering her car due to the amount of hip flexion required. Pt. States she was able to go the gym to do water aerobics 1x since last treatment with no increase in pain remaining at 3/10 L ant. Hip pain on NPS.   Treatment:  08/09/22    There.ex.:     Sci-Fit for 10 mins at L 5, STS 10 reps, Squats 2 x  30 secs, Side stepping, squatted with YTB 4 x 15 ft, back stepping YTB 2 x 15 reps, Tandem stepping, heel raises. Pt required frequent rest breaks.   Not performed today.    6 MWT - 548 ft. - slight antalgic gait, use of SPC. Pt. Stated minimal increase in pain and required minimal standing breaks.   5xSTS - 12.49 sec. Hands on knees to push up.   B standing marches/hamstring curls with pt. using mod. UE support bar in front. 2x10 reps each. No pain increase.   Hip abd./ext. with mod. UE support in front 1 x 10 each. Pt. Limited by endurance with slight increase in pain.   Seated ball squeezes to strengthen adductor muscles. 2x15.   B calf raises 2x15 in //-bars for UE support  Discussed HEP/ gym based ex. Program. PT encourages pool exercise at local [employer] to decrease force on L hip while encouraging movement.   PATIENT EDUCATION:  Education details: Access Code: [phone] Person educated: Patient Education method: Explanation, Demonstration, and Handouts Education comprehension: verbalized understanding and returned demonstration   HOME EXERCISE PROGRAM: Access Code: [email or web address] URL: [email or web address] Date: 11/25/2021 Prepared by: Dorcas Carrow  Exercises - Straight Leg Raise  - 1 x daily - 7 x weekly - 2 sets - 10 reps - Supine Heel Slide  - 1 x daily - 7 x weekly - 2 sets - 10 reps - Supine Hip Adduction Isometric with Ball  - 1 x daily - 7 x weekly - 2 sets - 10 reps - Hooklying Gluteal Sets  - 1 x daily - 7 x weekly -  2 sets - 10 reps - Seated Heel Raise  - 1 x daily - 7 x weekly - 2 sets - 10 reps - Seated Heel Toe Raises  - 1 x daily - 7 x weekly - 2 sets - 10 reps  Access Code: [email or web address] URL: [email or web address] Date: 06/27/2022 Prepared by: Dorcas Carrow  Exercises - Straight Leg Raise  - 1 x daily - 7 x weekly - 2 sets - 10 reps - Supine Hip Adduction Isometric with Ball  - 1 x daily - 7 x weekly - 2 sets - 10 reps - Hooklying  Gluteal Sets  - 1 x daily - 7 x weekly - 2 sets - 10 reps - Standing Hip Abduction  - 1 x daily - 7 x weekly - 2 sets - 12 reps - Mini Squat with Counter Support  - 1 x daily - 7 x weekly - 2 sets - 12 reps  ASSESSMENT:  CLINICAL IMPRESSION:  Pt reported with SPC with antalgic gait and pain of 4/10 which increased to 5/10 with exs. Pt introduced to neuromuscular strengthening exs with YTB. Pt demonstrates continued progress towards her goals and continues to show decreased strength, balance and endurance. Pt will benefit from continued PT interventions to address pt's impairments and help pt achieve her goals.   OBJECTIVE IMPAIRMENTS Abnormal gait, decreased activity tolerance, decreased balance, decreased endurance, decreased mobility, difficulty walking, decreased ROM, decreased strength, hypomobility, impaired flexibility, impaired sensation, improper body mechanics, postural dysfunction, obesity, and pain.   ACTIVITY LIMITATIONS carrying, lifting, bending, sitting, standing, squatting, sleeping, stairs, transfers, bed mobility, dressing, and locomotion level  PARTICIPATION LIMITATIONS: cleaning, laundry, driving, shopping, community activity, occupation, and yard work  PERSONAL FACTORS Age, Education, and Past/current experiences are also affecting patient's functional outcome.   REHAB POTENTIAL: Good  CLINICAL DECISION MAKING: Evolving/moderate complexity  EVALUATION COMPLEXITY: Moderate   GOALS: Goals reviewed with patient Yes  SHORT TERM GOALS: Target date: 07/22/22  Pt will ambulate through the clinic with a symmetrical gait pattern, with equal step length and decreased pain for > 100 ft.  Baseline: Asymmetrical gait - increased L step length and decreased R step length Goal status: MET   LONG TERM GOALS: Target date: 08/19/22  Pt will increase FOTO score to > 58 Baseline: 9/7: 52 (no significant changes).  11/8: 49 (slight regression secondary to increase L anterior hip  pain with stair climbing/ increase distance walked.  1/16: 46 (continued regression/ pt. Considering THA) Goal status: Not met  2.  Pt will decrease pain level to < 2/10 during all ADLs, in order to efficiently accomplish activities with decreased pain and increased independence Baseline:  Goal status: Partially met  3.  Pt will increase Active L knee flexion ROM to > 120 degrees  Baseline: 99, 106 passive.  8/8: 118 deg.  10/25: 118 deg.  Goal status: Partially met  PLAN: PT FREQUENCY: 1x/week  PT DURATION: 8 weeks  PLANNED INTERVENTIONS: Therapeutic exercises, Therapeutic activity, Neuromuscular re-education, Balance training, Gait training, Patient/Family education, Joint mobilization, Stair training, Aquatic Therapy, Cryotherapy, Moist heat, and Manual therapy.  PLAN FOR NEXT SESSION:  More standing dynamic ex., functional activity conditioning (walking, steps)    Joaquin Music PT DPT 12:35 PM,08/09/22  08/09/2022, 12:35 PM  [facility] [address] [place], [place], [address] Phone: [phone]   Fax:  [phone]

## 2022-08-09 NOTE — Assessment & Plan Note (Signed)
Chronic, stable. Continue taking Amlodipine 2m, HCTZ 233m and Valsartan 806maily. Recheck in office BP 120/75 today. Recommend to check BP at pharmacy with no cost and keep log of readings. Encouraged DASH diet. Return in 6 months.

## 2022-08-09 NOTE — Assessment & Plan Note (Addendum)
Chronic, stable. Last LDL (130) 02/2022.  Continue taking Rosuvastatin 62m daily. Lipid panel done today. ASCVD risk score 9.8% which puts her at high risk for CVD. Recommend diet and exercise to help with this. Will recommend increasing statin dose when lab results return. Return in 6 months.

## 2022-08-09 NOTE — Assessment & Plan Note (Addendum)
Chronic, stable. Last A1c 6.8%, at goal of <7. Start taking Metformin 1079m x2 day instead of 5066m2 day. A1c done today. Continue taking Glipizide 1016mnd Mounjaro 5mg47meekly). Recommend diet of fruits and vegetables, and increasing physical activity of 30 mins x5/day. Return in 6 months.

## 2022-08-10 LAB — COMPREHENSIVE METABOLIC PANEL
ALT: 10 IU/L (ref 0–32)
AST: 17 IU/L (ref 0–40)
Albumin/Globulin Ratio: 1.2 (ref 1.2–2.2)
Albumin: 4.1 g/dL (ref 3.8–4.9)
Alkaline Phosphatase: 74 IU/L (ref 44–121)
BUN/Creatinine Ratio: 16 (ref 9–23)
BUN: 15 mg/dL (ref 6–24)
Bilirubin Total: 0.4 mg/dL (ref 0.0–1.2)
CO2: 23 mmol/L (ref 20–29)
Calcium: 9.1 mg/dL (ref 8.7–10.2)
Chloride: 104 mmol/L (ref 96–106)
Creatinine, Ser: 0.91 mg/dL (ref 0.57–1.00)
Globulin, Total: 3.4 g/dL (ref 1.5–4.5)
Glucose: 98 mg/dL (ref 70–99)
Potassium: 3.7 mmol/L (ref 3.5–5.2)
Sodium: 144 mmol/L (ref 134–144)
Total Protein: 7.5 g/dL (ref 6.0–8.5)
eGFR: 75 mL/min/{1.73_m2} (ref >59)

## 2022-08-10 LAB — HEMOGLOBIN A1C
Est. average glucose Bld gHb Est-mCnc: 126 mg/dL
Hgb A1c MFr Bld: 6 % — ABNORMAL HIGH (ref 4.8–5.6)

## 2022-08-10 LAB — LIPID PANEL W/O CHOL/HDL RATIO
Cholesterol, Total: 139 mg/dL (ref 100–199)
HDL: 48 mg/dL (ref >39)
LDL Chol Calc (NIH): 78 mg/dL (ref 0–99)
Triglycerides: 64 mg/dL (ref 0–149)
VLDL Cholesterol Cal: 13 mg/dL (ref 5–40)

## 2022-08-10 LAB — SPECIMEN STATUS REPORT

## 2022-08-10 NOTE — Progress Notes (Signed)
Please let patient know that her lab work looks good.  Her A1c is improved to 6.0.  Keep up the good work.  No concerns at this time.  Continue with current medication regimen.  Follow up as discussed.

## 2022-08-17 ENCOUNTER — Ambulatory Visit: Payer: Medicaid Other | Admitting: Physical Therapy

## 2022-08-17 ENCOUNTER — Encounter: Payer: Self-pay | Admitting: Physical Therapy

## 2022-08-17 DIAGNOSIS — R2689 Other abnormalities of gait and mobility: Secondary | ICD-10-CM

## 2022-08-17 DIAGNOSIS — R269 Unspecified abnormalities of gait and mobility: Secondary | ICD-10-CM | POA: Diagnosis not present

## 2022-08-17 DIAGNOSIS — M25552 Pain in left hip: Secondary | ICD-10-CM

## 2022-08-17 DIAGNOSIS — M25652 Stiffness of left hip, not elsewhere classified: Secondary | ICD-10-CM | POA: Diagnosis not present

## 2022-08-17 DIAGNOSIS — R29898 Other symptoms and signs involving the musculoskeletal system: Secondary | ICD-10-CM | POA: Diagnosis not present

## 2022-08-17 DIAGNOSIS — M6281 Muscle weakness (generalized): Secondary | ICD-10-CM

## 2022-08-17 DIAGNOSIS — M5459 Other low back pain: Secondary | ICD-10-CM | POA: Diagnosis not present

## 2022-08-17 NOTE — Therapy (Signed)
OUTPATIENT PHYSICAL THERAPY THORACOLUMBAR TREATMENT/ RECERTIFICATION  Patient Name: Yesenia Stevens MRN: [id number] DOB:[birth year], 57 y.o., female Today's Date: 08/18/2022   PT End of Session - 08/17/22 1305     Visit Number 50    Number of Visits 73    Date for PT Re-Evaluation 10/12/22    PT Start Time [id number]    PT Stop Time 1346    PT Time Calculation (min) 50 min    Activity Tolerance Patient tolerated treatment well;Patient limited by pain    Behavior During Therapy [facility] for tasks assessed/performed              Past Medical History:  Diagnosis Date   Diabetes mellitus without complication ([place])    Hypertension    Past Surgical History:  Procedure Laterality Date   CESAREAN SECTION     CHOLECYSTECTOMY     COLONOSCOPY WITH PROPOFOL N/A 12/10/2019   Procedure: COLONOSCOPY WITH PROPOFOL;  Surgeon: [name], MD;  Location: [facility] ENDOSCOPY;  Service: Gastroenterology;  Laterality: N/A;   COLONOSCOPY WITH PROPOFOL N/A 12/24/2019   Procedure: COLONOSCOPY WITH PROPOFOL;  Surgeon: [name], MD;  Location: [facility] ENDOSCOPY;  Service: Gastroenterology;  Laterality: N/A;   FRACTURE SURGERY     Patient Active Problem List   Diagnosis Date Noted   Hyperlipidemia associated with type 2 diabetes mellitus ([place]) 09/22/2021   Displaced fracture of base of neck of right femur, sequela 07/16/2021   Closed fracture of posterior wall of left acetabulum with routine healing 06/16/2021   Palpitations 03/19/2020   Diarrhea 03/19/2020   Premature atrial contractions 08/20/2018   Morbid (severe) obesity due to excess calories ([place]) 08/01/2018   Shortness of breath 07/26/2018   Chronic cough [id number]   Uncomplicated type 2 diabetes mellitus ([place]) 03/22/2018   Hypertension associated with diabetes ([place]) 03/22/2018    PCP: Jon Billings, NP   REFERRING PROVIDER: Katherine Mantle, MD  REFERRING DIAG:  Diagnosis  S32.15XD (ICD-10-CM) - Type 2 fracture of sacrum, subsequent  encounter for fracture with routine healing  S32.422D (ICD-10-CM) - Displaced fracture of posterior wall of left acetabulum, subsequent encounter for fracture with routine healing    Rationale for Evaluation and Treatment Rehabilitation  THERAPY DIAG:  Stiffness of left hip joint  Gait difficulty  Pain in left hip  Balance problems  Muscle weakness (generalized)  Impaired strength of hip muscles  ONSET DATE: 05/28/21  SUBJECTIVE:                                                                                                                                                                                            PERTINENT  HISTORY:  Hx of L hip dislocation, and screws placed in lower back after her injury. Pt left hospital in a walker, and utilized a WC originally during [facility]. Pt has decreased ankle DF in L ankle, and pain in her lateral foot / 5th metatarsal. Pt had L knee scope, and has tenderness/bruising in L LE along tibia.  PAIN:  Are you having pain? NPRS scale: 4-5/10 Pain location: L hip (anterior) Pain description: popping and dull/achy Aggravating factors: standing, bending forward (especially left) Relieving factors: sitting down, tylenol PRN   PRECAUTIONS: Anterior hip, Posterior hip, Knee, and Fall  WEIGHT BEARING RESTRICTIONS no  FALLS:  Has patient fallen in last 6 months Yes. Number of falls 1  LIVING ENVIRONMENT: Lives with: lives with their family Lives in: House/apartment Stairs: yes - 2 to get into home with raining Has following equipment at home: Quad cane large base and Wheelchair (manual)  OCCUPATION:  Pt is currently on long term disability. Previously worked at [employer] prior to injury. Would like to get back to work if the company can find a position that is more stationary.  PLOF: Independent  PATIENT GOALS Pt wants to alleviate pain, become more mobile, strengthen back musculature, strengthen LEs   OBJECTIVE:   DIAGNOSTIC  FINDINGS:  LUMBAR SPINE - 2-3 VIEW   COMPARISON:  MRI 09/28/2009   FINDINGS: 4-5 mm retrolisthesis L5-S1, slightly progressive since prior examination, possibly degenerative in nature. Otherwise normal lumbar lordosis. No acute fracture of the lumbar spine. Vertebral body height is preserved. Intervertebral disc space narrowing and endplate remodeling at 075-GRM and L5-S1 is in keeping with mild degenerative disc disease at these levels. Paraspinal soft tissues are unremarkable. Right sacroiliac arthrodesis has been performed with 2 partially threaded screws.   IMPRESSION: No acute fracture or traumatic listhesis.   Degenerative changes L4-S1 with 5 mm retrolisthesis L5-S1.     Electronically Signed   By: Fidela Salisbury M.D.   On: 09/17/2021 22:13  PATIENT SURVEYS:  FOTO 52 ; 61  SCREENING FOR RED FLAGS: Bowel or bladder incontinence: No Spinal tumors: No Cauda equina syndrome: No Compression fracture: No Abdominal aneurysm: No  COGNITION:  Overall cognitive status: Within functional limits for tasks assessed     SENSATION: Not tested - patient reports altered sensation in L LE/foot  POSTURE: rounded shoulders, forward head, and weight shift left  PALPATION: Moderate L medial lower leg tenderness with palpation  LUMBAR ROM:   Active  AROM  eval  Flexion 50% limited (compensation to R)  Extension 75% limited (pain)  Right lateral flexion   Left lateral flexion   Right rotation 50% limited  Left rotation 25% limited   (Blank rows = not tested)  LOWER EXTREMITY ROM:     PROM/AROM Right eval Left eval  Hip flexion WFL 119/ 90 deg.  Hip extension    Hip abduction [facility]  Hip adduction    Hip internal rotation    Hip external rotation    Knee flexion WFL 106/ 99 deg.  Knee extension    Ankle dorsiflexion WFL Limited by pain  Ankle plantarflexion WFL Limited by pain  Ankle inversion    Ankle eversion     (Blank rows = not tested)  LOWER EXTREMITY  MMT:    MMT Right eval Left eval  Hip flexion 4/5 3/5  Hip extension    Hip abduction 4/5 3/5  Hip adduction 4/5 4/5  Hip internal rotation 5/5 unable  Hip external rotation 5/5 3/5  Knee flexion 5/5 3/5 (pain)  Knee extension 5/5 4/5  Ankle dorsiflexion 4+/5 3/5  Ankle plantarflexion 5/5 4/5  Ankle inversion    Ankle eversion     (Blank rows = not tested)  FUNCTIONAL TESTS:  5 times sit to stand: 25.4 seconds .  9/21:  14.06 seconds (marked improvement).    GAIT: Distance walked: in clinic Assistive device utilized: Quad cane small base Level of assistance: Modified independence Comments: R sided Trendelenburg.  L antalgic gait pattern with decrease stance on L with short R LE step length/ shuffling.  No arm swing. Walks with wide base of support, with mild supination of B feet. Pt had limited endurance, and significantly limited L hip flexion and DF to clear her foot during swing phase.     01/25/22: 5xSTS: 15.94 sec./ 14.94 sec.  (Marked improvement).      9/21:  5xSTS: 14.06 seconds (marked improvement).      11/8: 5xSTS: 12.48 seconds  (improvement).      06/24/22: 9.89 seconds (marked improvement).    Subjective: 08/17/22:   Pt. Arrived to therapy reporting L ant. Hip pain of 3/10 on NPS. Pt. Reports 5/10 L ant. Hip pain with activities/ increase walking. Pt continues to report difficulty with entering her car due to the amount of hip flexion required.  Pt. Has not been too active at home and no recent trips out of town reported.  Treatment:      There.ex.:    Nustep for 10 min at L4  while discussing HEP and Pt condition and pain levels.     High marching in //-bars with cuing to correct posture/ mirror feedback.  3 laps.  Increase L hip pain.     4# ankle wts.: standing hip extension 10x2.  No weight for standing hip abduction 10x2.                              Squats 2 x 30 secs, Side stepping 4 x 15 ft with YTB,  squatted side stepping  with YTB 4 x 15 ft, Back  stepping 2 x 72f, tandem walking in //bars, toe raises 2 x 10 reps, Standing on NBOS with EO/EC 30 secs each, Standing on foam EO/EC 30 secs.      L/R LE strength testing: hip flexion 3-/4, quad 4+/4+, R hip abduction 4-/3+.  L knee flexion 119 deg.    Discussed HEP/ gym based ex. Program. PT encourages pool exercise at local [employer] to decrease force on L hip while encouraging movement.   PATIENT EDUCATION:  Education details: Access Code: [email or web address] educated: Patient Education method: Explanation, Demonstration, and Handouts Education comprehension: verbalized understanding and returned demonstration   HOME EXERCISE PROGRAM: Access Code: [email or web address]: [email or web address] Date: 11/25/2021 Prepared by: MDorcas Carrow Exercises - Straight Leg Raise  - 1 x daily - 7 x weekly - 2 sets - 10 reps - Supine Heel Slide  - 1 x daily - 7 x weekly - 2 sets - 10 reps - Supine Hip Adduction Isometric with Ball  - 1 x daily - 7 x weekly - 2 sets - 10 reps - Hooklying Gluteal Sets  - 1 x daily - 7 x weekly - 2 sets - 10 reps - Seated Heel Raise  - 1 x daily - 7 x weekly - 2 sets - 10 reps - Seated Heel Toe Raises  - 1 x daily - 7 x weekly -  2 sets - 10 reps  Access Code: [email or web address] URL: [email or web address] Date: 06/27/2022 Prepared by: Dorcas Carrow  Exercises - Straight Leg Raise  - 1 x daily - 7 x weekly - 2 sets - 10 reps - Supine Hip Adduction Isometric with Ball  - 1 x daily - 7 x weekly - 2 sets - 10 reps - Hooklying Gluteal Sets  - 1 x daily - 7 x weekly - 2 sets - 10 reps - Standing Hip Abduction  - 1 x daily - 7 x weekly - 2 sets - 12 reps - Mini Squat with Counter Support  - 1 x daily - 7 x weekly - 2 sets - 12 reps  ASSESSMENT:  CLINICAL IMPRESSION:  Pt presented today with SPC with antalgic gait.  Pt. Remains limited with moderate L hip pain with increase activity/ walking/ getting into car.  Pt's pain fluctuates during tx. and she takes meds  for relief. Pt provided with rest breaks as needed to control pain. Pt demonstrates slow but steady progress and continues to demonstrate proximal weakness > distal, high fall risk and fair endurance.  Pt will benefit from continue PT interventions to address pt impairments in order to achieve pt's goal.   OBJECTIVE IMPAIRMENTS Abnormal gait, decreased activity tolerance, decreased balance, decreased endurance, decreased mobility, difficulty walking, decreased ROM, decreased strength, hypomobility, impaired flexibility, impaired sensation, improper body mechanics, postural dysfunction, obesity, and pain.   ACTIVITY LIMITATIONS carrying, lifting, bending, sitting, standing, squatting, sleeping, stairs, transfers, bed mobility, dressing, and locomotion level  PARTICIPATION LIMITATIONS: cleaning, laundry, driving, shopping, community activity, occupation, and yard work  PERSONAL FACTORS Age, Education, and Past/current experiences are also affecting patient's functional outcome.   REHAB POTENTIAL: Good  CLINICAL DECISION MAKING: Evolving/moderate complexity  EVALUATION COMPLEXITY: Moderate   GOALS: Goals reviewed with patient Yes  SHORT TERM GOALS: Target date: 07/22/22  Pt will ambulate through the clinic with a symmetrical gait pattern, with equal step length and decreased pain for > 100 ft.  Baseline: Asymmetrical gait - increased L step length and decreased R step length Goal status: MET   LONG TERM GOALS: Target date: 10/12/22  Pt will increase FOTO score to > 58 Baseline: 9/7: 52 (no significant changes).  11/8: 49 (slight regression secondary to increase L anterior hip pain with stair climbing/ increase distance walked.  1/16: 46 (continued regression/ pt. Considering THA) Goal status: Not met  2.  Pt will decrease pain level to < 2/10 during all ADLs, in order to efficiently accomplish activities with decreased pain and increased independence Baseline: see above Goal status:  Partially met  3.  Pt will increase Active L knee flexion ROM to > 120 degrees. Baseline: 99, 106 passive.  8/8: 118 deg.  10/25: 118 deg. 2/28: 119 deg.  Goal status: Partially met  4.  Pt. Will increase B LE muscle strength 1/2 muscle grade to improve stepping up into drivers side of car.    Baseline: see above  Goal status: Initial  PLAN: PT FREQUENCY: 1x/week  PT DURATION: 8 weeks  PLANNED INTERVENTIONS: Therapeutic exercises, Therapeutic activity, Neuromuscular re-education, Balance training, Gait training, Patient/Family education, Joint mobilization, Stair training, Aquatic Therapy, Cryotherapy, Moist heat, and Manual therapy.  PLAN FOR NEXT SESSION:  More standing dynamic ex., functional activity conditioning (walking, steps)   Pura Spice, PT, DPT # [phone] 11:03 AM,08/18/22  [facility] [address] [place], [place], [address] Phone: [phone]   Fax:  [phone]

## 2022-08-24 ENCOUNTER — Ambulatory Visit: Payer: BLUE CROSS/BLUE SHIELD | Attending: Trauma Surgery | Admitting: Physical Therapy

## 2022-08-24 ENCOUNTER — Encounter: Payer: Self-pay | Admitting: Physical Therapy

## 2022-08-24 DIAGNOSIS — M6281 Muscle weakness (generalized): Secondary | ICD-10-CM | POA: Insufficient documentation

## 2022-08-24 DIAGNOSIS — R29898 Other symptoms and signs involving the musculoskeletal system: Secondary | ICD-10-CM | POA: Diagnosis present

## 2022-08-24 DIAGNOSIS — M25552 Pain in left hip: Secondary | ICD-10-CM | POA: Diagnosis present

## 2022-08-24 DIAGNOSIS — R269 Unspecified abnormalities of gait and mobility: Secondary | ICD-10-CM | POA: Insufficient documentation

## 2022-08-24 DIAGNOSIS — M25652 Stiffness of left hip, not elsewhere classified: Secondary | ICD-10-CM | POA: Diagnosis present

## 2022-08-24 DIAGNOSIS — R2689 Other abnormalities of gait and mobility: Secondary | ICD-10-CM | POA: Diagnosis present

## 2022-08-24 NOTE — Therapy (Signed)
OUTPATIENT PHYSICAL THERAPY THORACOLUMBAR TREATMENT  Patient Name: Yesenia Stevens MRN: [id number] DOB:[birth year], 57 y.o., female Today's Date: 08/25/2022   PT End of Session - 08/24/22 1116     Visit Number 74    Number of Visits 27    Date for PT Re-Evaluation 10/12/22    PT Start Time 1116    PT Stop Time 1204    PT Time Calculation (min) 48 min    Activity Tolerance Patient tolerated treatment well;Patient limited by pain    Behavior During Therapy [facility] for tasks assessed/performed            Past Medical History:  Diagnosis Date   Diabetes mellitus without complication ([place])    Hypertension    Past Surgical History:  Procedure Laterality Date   CESAREAN SECTION     CHOLECYSTECTOMY     COLONOSCOPY WITH PROPOFOL N/A 12/10/2019   Procedure: COLONOSCOPY WITH PROPOFOL;  Surgeon: [name], MD;  Location: [facility] ENDOSCOPY;  Service: Gastroenterology;  Laterality: N/A;   COLONOSCOPY WITH PROPOFOL N/A 12/24/2019   Procedure: COLONOSCOPY WITH PROPOFOL;  Surgeon: [name], MD;  Location: [facility] ENDOSCOPY;  Service: Gastroenterology;  Laterality: N/A;   FRACTURE SURGERY     Patient Active Problem List   Diagnosis Date Noted   Hyperlipidemia associated with type 2 diabetes mellitus ([place]) 09/22/2021   Displaced fracture of base of neck of right femur, sequela 07/16/2021   Closed fracture of posterior wall of left acetabulum with routine healing 06/16/2021   Palpitations 03/19/2020   Diarrhea 03/19/2020   Premature atrial contractions 08/20/2018   Morbid (severe) obesity due to excess calories ([place]) 08/01/2018   Shortness of breath 07/26/2018   Chronic cough [id number]   Uncomplicated type 2 diabetes mellitus ([place]) 03/22/2018   Hypertension associated with diabetes ([place]) 03/22/2018    PCP: Jon Billings, NP   REFERRING PROVIDER: Katherine Mantle, MD  REFERRING DIAG:  Diagnosis  S32.15XD (ICD-10-CM) - Type 2 fracture of sacrum, subsequent encounter for fracture  with routine healing  S32.422D (ICD-10-CM) - Displaced fracture of posterior wall of left acetabulum, subsequent encounter for fracture with routine healing    Rationale for Evaluation and Treatment Rehabilitation  THERAPY DIAG:  Stiffness of left hip joint  Gait difficulty  Pain in left hip  Balance problems  Muscle weakness (generalized)  Impaired strength of hip muscles  Stiffness of left hip, not elsewhere classified  ONSET DATE: 05/28/21  SUBJECTIVE:  PERTINENT HISTORY:  Hx of L hip dislocation, and screws placed in lower back after her injury. Pt left hospital in a walker, and utilized a WC originally during [facility]. Pt has decreased ankle DF in L ankle, and pain in her lateral foot / 5th metatarsal. Pt had L knee scope, and has tenderness/bruising in L LE along tibia.  PAIN:  Are you having pain? NPRS scale: 4-5/10 Pain location: L hip (anterior) Pain description: popping and dull/achy Aggravating factors: standing, bending forward (especially left) Relieving factors: sitting down, tylenol PRN   PRECAUTIONS: Anterior hip, Posterior hip, Knee, and Fall  WEIGHT BEARING RESTRICTIONS no  FALLS:  Has patient fallen in last 6 months Yes. Number of falls 1  LIVING ENVIRONMENT: Lives with: lives with their family Lives in: House/apartment Stairs: yes - 2 to get into home with raining Has following equipment at home: Quad cane large base and Wheelchair (manual)  OCCUPATION:  Pt is currently on long term disability. Previously worked at [employer] prior to injury. Would like to get back to work if the company can find a position that is more stationary.  PLOF: Independent  PATIENT GOALS Pt wants to alleviate pain, become more mobile, strengthen back musculature, strengthen  LEs   OBJECTIVE:   DIAGNOSTIC FINDINGS:  LUMBAR SPINE - 2-3 VIEW   COMPARISON:  MRI 09/28/2009   FINDINGS: 4-5 mm retrolisthesis L5-S1, slightly progressive since prior examination, possibly degenerative in nature. Otherwise normal lumbar lordosis. No acute fracture of the lumbar spine. Vertebral body height is preserved. Intervertebral disc space narrowing and endplate remodeling at 075-GRM and L5-S1 is in keeping with mild degenerative disc disease at these levels. Paraspinal soft tissues are unremarkable. Right sacroiliac arthrodesis has been performed with 2 partially threaded screws.   IMPRESSION: No acute fracture or traumatic listhesis.   Degenerative changes L4-S1 with 5 mm retrolisthesis L5-S1.     Electronically Signed   By: Fidela Salisbury M.D.   On: 09/17/2021 22:13  PATIENT SURVEYS:  FOTO 52 ; 61  SCREENING FOR RED FLAGS: Bowel or bladder incontinence: No Spinal tumors: No Cauda equina syndrome: No Compression fracture: No Abdominal aneurysm: No  COGNITION:  Overall cognitive status: Within functional limits for tasks assessed     SENSATION: Not tested - patient reports altered sensation in L LE/foot  POSTURE: rounded shoulders, forward head, and weight shift left  PALPATION: Moderate L medial lower leg tenderness with palpation  LUMBAR ROM:   Active  AROM  eval  Flexion 50% limited (compensation to R)  Extension 75% limited (pain)  Right lateral flexion   Left lateral flexion   Right rotation 50% limited  Left rotation 25% limited   (Blank rows = not tested)  LOWER EXTREMITY ROM:     PROM/AROM Right eval Left eval  Hip flexion WFL 119/ 90 deg.  Hip extension    Hip abduction [facility]  Hip adduction    Hip internal rotation    Hip external rotation    Knee flexion WFL 106/ 99 deg.  Knee extension    Ankle dorsiflexion WFL Limited by pain  Ankle plantarflexion WFL Limited by pain  Ankle inversion    Ankle eversion     (Blank rows  = not tested)  LOWER EXTREMITY MMT:    MMT Right eval Left eval  Hip flexion 4/5 3/5  Hip extension    Hip abduction 4/5 3/5  Hip adduction 4/5 4/5  Hip internal rotation 5/5 unable  Hip external rotation 5/5  3/5  Knee flexion 5/5 3/5 (pain)  Knee extension 5/5 4/5  Ankle dorsiflexion 4+/5 3/5  Ankle plantarflexion 5/5 4/5  Ankle inversion    Ankle eversion     (Blank rows = not tested)  FUNCTIONAL TESTS:  5 times sit to stand: 25.4 seconds .  9/21:  14.06 seconds (marked improvement).    GAIT: Distance walked: in clinic Assistive device utilized: Quad cane small base Level of assistance: Modified independence Comments: R sided Trendelenburg.  L antalgic gait pattern with decrease stance on L with short R LE step length/ shuffling.  No arm swing. Walks with wide base of support, with mild supination of B feet. Pt had limited endurance, and significantly limited L hip flexion and DF to clear her foot during swing phase.     01/25/22: 5xSTS: 15.94 sec./ 14.94 sec.  (Marked improvement).      9/21:  5xSTS: 14.06 seconds (marked improvement).      11/8: 5xSTS: 12.48 seconds  (improvement).      06/24/22: 9.89 seconds (marked improvement).    2/28: L/R LE strength testing: hip flexion 3-/4, quad 4+/4+, R hip abduction 4-/3+.  L knee flexion 119 deg.   TREATMENT:  08/24/22  Subjective:   Pt. Arrived to therapy reporting L ant. Hip pain of 3/10 on NPS. Pt. Has been to gym 1x since last PT appt.  Today is pts. Birthday.    Treatment:      There.ex.:    Nustep for 10 min at L5  while discussing HEP and gym ex.      6" step touches with B UE assist and progressing to light UE assist.  Cuing to increase L hip flexion (heel/ toe strike).     No ankle wts.: LAQ/ seated marching/ standing hip extension/ lateral walking 10x2.  High marching in //-bars with cuing to correct posture/ mirror feedback.  3 laps.  Limited L hip flexion as compared to R.   Walking in gym/ clinic without  use of SPC working on consistent stance time on L as compared to R LE.     Discussed HEP/ gym based ex. Program. PT encourages pool exercise at local [employer] to decrease force on L hip while encouraging movement.   PATIENT EDUCATION:  Education details: Access Code: [email or web address] Person educated: Patient Education method: Explanation, Demonstration, and Handouts Education comprehension: verbalized understanding and returned demonstration   HOME EXERCISE PROGRAM: Access Code: [email or web address] URL: [email or web address] Date: 11/25/2021 Prepared by: Dorcas Carrow  Exercises - Straight Leg Raise  - 1 x daily - 7 x weekly - 2 sets - 10 reps - Supine Heel Slide  - 1 x daily - 7 x weekly - 2 sets - 10 reps - Supine Hip Adduction Isometric with Ball  - 1 x daily - 7 x weekly - 2 sets - 10 reps - Hooklying Gluteal Sets  - 1 x daily - 7 x weekly - 2 sets - 10 reps - Seated Heel Raise  - 1 x daily - 7 x weekly - 2 sets - 10 reps - Seated Heel Toe Raises  - 1 x daily - 7 x weekly - 2 sets - 10 reps  Access Code: [email or web address] URL: [email or web address] Date: 06/27/2022 Prepared by: Dorcas Carrow  Exercises - Straight Leg Raise  - 1 x daily - 7 x weekly - 2 sets - 10 reps - Supine Hip Adduction Isometric with Ball  - 1 x daily - 7 x weekly - 2 sets - 10 reps -  Hooklying Gluteal Sets  - 1 x daily - 7 x weekly - 2 sets - 10 reps - Standing Hip Abduction  - 1 x daily - 7 x weekly - 2 sets - 12 reps - Mini Squat with Counter Support  - 1 x daily - 7 x weekly - 2 sets - 12 reps  ASSESSMENT:  CLINICAL IMPRESSION:  Pt presented today with SPC with L antalgic gait.  Pt. Remains limited with moderate L hip pain with increase activity/ walking/ getting into car.  Pt's pain fluctuates during tx. and she takes meds for relief. Pt provided with rest breaks as needed to control pain. Pt demonstrates slow but steady progress and continues to demonstrate proximal weakness > distal, high fall risk  and fair endurance.  Pt will benefit from continue PT interventions to address pt impairments in order to achieve pt's goal.   OBJECTIVE IMPAIRMENTS Abnormal gait, decreased activity tolerance, decreased balance, decreased endurance, decreased mobility, difficulty walking, decreased ROM, decreased strength, hypomobility, impaired flexibility, impaired sensation, improper body mechanics, postural dysfunction, obesity, and pain.   ACTIVITY LIMITATIONS carrying, lifting, bending, sitting, standing, squatting, sleeping, stairs, transfers, bed mobility, dressing, and locomotion level  PARTICIPATION LIMITATIONS: cleaning, laundry, driving, shopping, community activity, occupation, and yard work  PERSONAL FACTORS Age, Education, and Past/current experiences are also affecting patient's functional outcome.   REHAB POTENTIAL: Good  CLINICAL DECISION MAKING: Evolving/moderate complexity  EVALUATION COMPLEXITY: Moderate   GOALS: Goals reviewed with patient Yes  SHORT TERM GOALS: Target date: 07/22/22  Pt will ambulate through the clinic with a symmetrical gait pattern, with equal step length and decreased pain for > 100 ft.  Baseline: Asymmetrical gait - increased L step length and decreased R step length Goal status: MET   LONG TERM GOALS: Target date: 10/12/22  Pt will increase FOTO score to > 58 Baseline: 9/7: 52 (no significant changes).  11/8: 49 (slight regression secondary to increase L anterior hip pain with stair climbing/ increase distance walked.  1/16: 46 (continued regression/ pt. Considering THA) Goal status: Not met  2.  Pt will decrease pain level to < 2/10 during all ADLs, in order to efficiently accomplish activities with decreased pain and increased independence Baseline: see above Goal status: Partially met  3.  Pt will increase Active L knee flexion ROM to > 120 degrees. Baseline: 99, 106 passive.  8/8: 118 deg.  10/25: 118 deg. 2/28: 119 deg.  Goal status: Partially  met  4.  Pt. Will increase B LE muscle strength 1/2 muscle grade to improve stepping up into drivers side of car.    Baseline: see above  Goal status: Initial  PLAN: PT FREQUENCY: 1x/week  PT DURATION: 8 weeks  PLANNED INTERVENTIONS: Therapeutic exercises, Therapeutic activity, Neuromuscular re-education, Balance training, Gait training, Patient/Family education, Joint mobilization, Stair training, Aquatic Therapy, Cryotherapy, Moist heat, and Manual therapy.  PLAN FOR NEXT SESSION:  More standing dynamic ex., functional activity conditioning (walking, steps)   Pura Spice, PT, DPT # [phone] 7:39 AM,08/25/22  [facility] [address] [place], [place], [address] Phone: [phone]   Fax:  [phone]

## 2022-08-26 ENCOUNTER — Telehealth: Payer: Self-pay | Admitting: Nurse Practitioner

## 2022-08-26 NOTE — Telephone Encounter (Signed)
Vinod calling from [place] is calling to check on the status of the Disability paper work that was received on 08/19/22. Please advise Cb- [phone] Refer [id number]

## 2022-08-30 NOTE — Progress Notes (Deleted)
LMP 08/20/2015 (Approximate) Comment: denies preg   Subjective:    Patient ID: Yesenia Stevens, female    DOB: 27-Apr-1966, 57 y.o.   MRN: [id number]  HPI: Yesenia Stevens is a 57 y.o. female presenting on 08/31/2022 for comprehensive medical examination. Current medical complaints include:none  She currently lives with: Menopausal Symptoms: no  HYPERTENSION / HYPERLIPIDEMIA Satisfied with current treatment? no Duration of hypertension: years BP monitoring frequency: not checking BP range:  BP medication side effects: no Past BP meds: lisinopril Duration of hyperlipidemia: years Cholesterol medication side effects: no Cholesterol supplements: none Past cholesterol medications: none Medication compliance: excellent compliance Aspirin: no Recent stressors: no Recurrent headaches: no Visual changes: no Palpitations: no Dyspnea: no Chest pain: no Lower extremity edema: no Dizzy/lightheaded: no  DIABETES Hypoglycemic episodes:no Polydipsia/polyuria: no Visual disturbance: no Chest pain: no Paresthesias: no Glucose Monitoring: no  Accucheck frequency: Not Checking  Fasting glucose:  Post prandial:  Evening:  Before meals: Taking Insulin?: no  Long acting insulin:  Short acting insulin: Blood Pressure Monitoring: not checking Retinal Examination: Not up to Date Foot Exam: Up to Date Diabetic Education: Not Completed Pneumovax: Not up to Date Influenza: Not up to Date Aspirin: no  Depression Screen done today and results listed below:     10/11/2021    3:38 PM 03/24/2021    3:03 PM 08/12/2019    4:01 PM 07/06/2018    3:34 PM  Depression screen PHQ 2/9  Decreased Interest 0 0 0 0  Down, Depressed, Hopeless 0 0 0 0  PHQ - 2 Score 0 0 0 0  Altered sleeping 0 0  0  Tired, decreased energy 1 0  0  Change in appetite 1 0  0  Feeling bad or failure about yourself  0 0  0  Trouble concentrating 0 0  0  Moving slowly or fidgety/restless 0 0  0  Suicidal  thoughts 0 0  0  PHQ-9 Score 2 0  0  Difficult doing work/chores Not difficult at all Not difficult at all  Not difficult at all    The patient does not have a history of falls. I did complete a risk assessment for falls. A plan of care for falls was documented.   Past Medical History:  Past Medical History:  Diagnosis Date  . Diabetes mellitus without complication ([place])   . Hypertension     Surgical History:  Past Surgical History:  Procedure Laterality Date  . CESAREAN SECTION    . CHOLECYSTECTOMY    . COLONOSCOPY WITH PROPOFOL N/A 12/10/2019   Procedure: COLONOSCOPY WITH PROPOFOL;  Surgeon: [name], MD;  Location: [facility] ENDOSCOPY;  Service: Gastroenterology;  Laterality: N/A;  . COLONOSCOPY WITH PROPOFOL N/A 12/24/2019   Procedure: COLONOSCOPY WITH PROPOFOL;  Surgeon: [name], MD;  Location: [facility] ENDOSCOPY;  Service: Gastroenterology;  Laterality: N/A;  . FRACTURE SURGERY      Medications:  Current Outpatient Medications on File Prior to Visit  Medication Sig  . albuterol (PROVENTIL HFA;VENTOLIN HFA) 108 (90 Base) MCG/ACT inhaler Inhale 1-2 puffs into the lungs every 6 (six) hours as needed for wheezing or shortness of breath.  Marland Kitchen amLODipine (NORVASC) 10 MG tablet Take 1 tablet (10 mg total) by mouth daily.  . empagliflozin (JARDIANCE) 10 MG TABS tablet Take 1 tablet (10 mg total) by mouth daily before breakfast.  . glipiZIDE (GLUCOTROL) 10 MG tablet Take 1 tablet (10 mg total) by mouth 2 (two) times daily before a meal.  . hydrochlorothiazide (HYDRODIURIL)  25 MG tablet Take 1 tablet (25 mg total) by mouth daily.  . hydrocortisone 2.5 % cream SMARTSIG:sparingly Topical Twice Daily  . ibuprofen (ADVIL) 600 MG tablet Take 1 tablet (600 mg total) by mouth every 6 (six) hours as needed.  . metFORMIN (GLUCOPHAGE) 1000 MG tablet Take 1 tablet (1,000 mg total) by mouth 2 (two) times daily with a meal.  . rosuvastatin (CRESTOR) 5 MG tablet Take 1 tablet (5 mg total) by mouth  daily.  . tirzepatide (MOUNJARO) 5 MG/0.5ML Pen Inject 5 mg into the skin once a week.  . valsartan (DIOVAN) 80 MG tablet Take 1 tablet (80 mg total) by mouth daily.   No current facility-administered medications on file prior to visit.    Allergies:  No Known Allergies  Social History:  Social History   Socioeconomic History  . Marital status: Single    Spouse name: Not on file  . Number of children: Not on file  . Years of education: Not on file  . Highest education level: Not on file  Occupational History  . Not on file  Tobacco Use  . Smoking status: Never  . Smokeless tobacco: Never  Vaping Use  . Vaping Use: Never used  Substance and Sexual Activity  . Alcohol use: Never    Alcohol/week: 0.0 standard drinks of alcohol  . Drug use: Never  . Sexual activity: Not Currently  Other Topics Concern  . Not on file  Social History Narrative  . Not on file   Social Determinants of Health   Financial Resource Strain: Low Risk  (08/24/2020)   Overall Financial Resource Strain (CARDIA)   . Difficulty of Paying Living Expenses: Not very hard  Food Insecurity: Not on file  Transportation Needs: Not on file  Physical Activity: Not on file  Stress: Not on file  Social Connections: Not on file  Intimate Partner Violence: Not on file   Social History   Tobacco Use  Smoking Status Never  Smokeless Tobacco Never   Social History   Substance and Sexual Activity  Alcohol Use Never  . Alcohol/week: 0.0 standard drinks of alcohol    Family History:  Family History  Problem Relation Age of Onset  . Hypertension Mother   . Diabetes Mother     Past medical history, surgical history, medications, allergies, family history and social history reviewed with patient today and changes made to appropriate areas of the chart.   Review of Systems  HENT:         Denies vision changes.  Eyes:  Negative for blurred vision and double vision.  Respiratory:  Negative for shortness  of breath.   Cardiovascular:  Negative for chest pain, palpitations and leg swelling.  Neurological:  Negative for dizziness, tingling and headaches.  Endo/Heme/Allergies:  Negative for polydipsia.       Denies Polyuria  All other ROS negative except what is listed above and in the HPI.      Objective:    LMP 08/20/2015 (Approximate) Comment: denies preg  Wt Readings from Last 3 Encounters:  08/09/22 283 lb 8 oz (128.6 kg)  08/04/22 281 lb 11.2 oz (127.8 kg)  07/07/22 282 lb 9.6 oz (128.2 kg)    Physical Exam Vitals and nursing note reviewed.  Constitutional:      General: She is awake. She is not in acute distress.    Appearance: She is well-developed. She is obese. She is not ill-appearing.  HENT:     Head: Normocephalic and atraumatic.  Right Ear: Hearing, tympanic membrane, ear canal and external ear normal. No drainage.     Left Ear: Hearing, tympanic membrane, ear canal and external ear normal. No drainage.     Nose: Nose normal.     Right Sinus: No maxillary sinus tenderness or frontal sinus tenderness.     Left Sinus: No maxillary sinus tenderness or frontal sinus tenderness.     Mouth/Throat:     Mouth: Mucous membranes are moist.     Pharynx: Oropharynx is clear. Uvula midline. No pharyngeal swelling, oropharyngeal exudate or posterior oropharyngeal erythema.  Eyes:     General: Lids are normal.        Right eye: No discharge.        Left eye: No discharge.     Extraocular Movements: Extraocular movements intact.     Conjunctiva/sclera: Conjunctivae normal.     Pupils: Pupils are equal, round, and reactive to light.     Visual Fields: Right eye visual fields normal and left eye visual fields normal.  Neck:     Thyroid: No thyromegaly.     Vascular: No carotid bruit.     Trachea: Trachea normal.  Cardiovascular:     Rate and Rhythm: Normal rate and regular rhythm.     Heart sounds: Normal heart sounds. No murmur heard.    No gallop.  Pulmonary:     Effort:  Pulmonary effort is normal. No accessory muscle usage or respiratory distress.     Breath sounds: Normal breath sounds.  Chest:  Breasts:    Right: Normal.     Left: Normal.  Abdominal:     General: Bowel sounds are normal.     Palpations: Abdomen is soft. There is no hepatomegaly or splenomegaly.     Tenderness: There is no abdominal tenderness.  Musculoskeletal:        General: Normal range of motion.     Cervical back: Normal range of motion and neck supple.     Right lower leg: No edema.     Left lower leg: No edema.  Lymphadenopathy:     Head:     Right side of head: No submental, submandibular, tonsillar, preauricular or posterior auricular adenopathy.     Left side of head: No submental, submandibular, tonsillar, preauricular or posterior auricular adenopathy.     Cervical: No cervical adenopathy.     Upper Body:     Right upper body: No supraclavicular, axillary or pectoral adenopathy.     Left upper body: No supraclavicular, axillary or pectoral adenopathy.  Skin:    General: Skin is warm and dry.     Capillary Refill: Capillary refill takes less than 2 seconds.     Findings: No rash.  Neurological:     Mental Status: She is alert and oriented to person, place, and time.     Gait: Gait is intact.     Deep Tendon Reflexes: Reflexes are normal and symmetric.     Reflex Scores:      Brachioradialis reflexes are 2+ on the right side and 2+ on the left side.      Patellar reflexes are 2+ on the right side and 2+ on the left side. Psychiatric:        Attention and Perception: Attention normal.        Mood and Affect: Mood normal.        Speech: Speech normal.        Behavior: Behavior normal. Behavior is cooperative.  Thought Content: Thought content normal.        Judgment: Judgment normal.    Results for orders placed or performed in visit on 08/09/22  Comprehensive metabolic panel  Result Value Ref Range   Glucose 98 70 - 99 mg/dL   BUN 15 6 - 24 mg/dL    Creatinine, Ser 0.91 0.57 - 1.00 mg/dL   eGFR 75 >59 mL/min/1.73   BUN/Creatinine Ratio 16 9 - 23   Sodium 144 134 - 144 mmol/L   Potassium 3.7 3.5 - 5.2 mmol/L   Chloride 104 96 - 106 mmol/L   CO2 23 20 - 29 mmol/L   Calcium 9.1 8.7 - 10.2 mg/dL   Total Protein 7.5 6.0 - 8.5 g/dL   Albumin 4.1 3.8 - 4.9 g/dL   Globulin, Total 3.4 1.5 - 4.5 g/dL   Albumin/Globulin Ratio 1.2 1.2 - 2.2   Bilirubin Total 0.4 0.0 - 1.2 mg/dL   Alkaline Phosphatase 74 44 - 121 IU/L   AST 17 0 - 40 IU/L   ALT 10 0 - 32 IU/L  Hemoglobin A1c  Result Value Ref Range   Hgb A1c MFr Bld 6.0 (H) 4.8 - 5.6 %   Est. average glucose Bld gHb Est-mCnc 126 mg/dL  Lipid Panel w/o Chol/HDL Ratio  Result Value Ref Range   Cholesterol, Total 139 100 - 199 mg/dL   Triglycerides 64 0 - 149 mg/dL   HDL 48 >39 mg/dL   VLDL Cholesterol Cal 13 5 - 40 mg/dL   LDL Chol Calc (NIH) 78 0 - 99 mg/dL  Specimen status report  Result Value Ref Range   specimen status report Comment       Assessment & Plan:   Problem List Items Addressed This Visit      Cardiovascular and Mediastinum   Hypertension associated with diabetes ([place]) - Primary     Endocrine   Uncomplicated type 2 diabetes mellitus ([place])   Hyperlipidemia associated with type 2 diabetes mellitus ([place])     Other   Morbid (severe) obesity due to excess calories ([place])     Follow up plan: No follow-ups on file.   LABORATORY TESTING:  - Pap smear: up to date  IMMUNIZATIONS:   - Tdap: Tetanus vaccination status reviewed: last tetanus booster within 10 years. - Influenza: Refused - Pneumovax: Not applicable - Prevnar: Not applicable - HPV: Not applicable - Zostavax vaccine: Refused  SCREENING: -Mammogram: Ordered today  - Colonoscopy: Up to date  - Bone Density: Not applicable  -Hearing Test: Not applicable  -Spirometry: Not applicable   PATIENT COUNSELING:   Advised to take 1 mg of folate supplement per day if capable of pregnancy.   Sexuality:  Discussed sexually transmitted diseases, partner selection, use of condoms, avoidance of unintended pregnancy  and contraceptive alternatives.   Advised to avoid cigarette smoking.  I discussed with the patient that most people either abstain from alcohol or drink within safe limits (<=14/week and <=4 drinks/occasion for males, <=7/weeks and <= 3 drinks/occasion for females) and that the risk for alcohol disorders and other health effects rises proportionally with the number of drinks per week and how often a drinker exceeds daily limits.  Discussed cessation/primary prevention of drug use and availability of treatment for abuse.   Diet: Encouraged to adjust caloric intake to maintain  or achieve ideal body weight, to reduce intake of dietary saturated fat and total fat, to limit sodium intake by avoiding high sodium foods and not adding table salt,  and to maintain adequate dietary potassium and calcium preferably from fresh fruits, vegetables, and low-fat dairy products.    stressed the importance of regular exercise  Injury prevention: Discussed safety belts, safety helmets, smoke detector, smoking near bedding or upholstery.   Dental health: Discussed importance of regular tooth brushing, flossing, and dental visits.    NEXT PREVENTATIVE PHYSICAL DUE IN 1 YEAR. No follow-ups on file.

## 2022-08-31 ENCOUNTER — Encounter: Payer: Medicaid Other | Admitting: Nurse Practitioner

## 2022-08-31 DIAGNOSIS — E119 Type 2 diabetes mellitus without complications: Secondary | ICD-10-CM

## 2022-08-31 DIAGNOSIS — Z0000 Encounter for general adult medical examination without abnormal findings: Secondary | ICD-10-CM

## 2022-08-31 DIAGNOSIS — E1169 Type 2 diabetes mellitus with other specified complication: Secondary | ICD-10-CM

## 2022-08-31 DIAGNOSIS — E1159 Type 2 diabetes mellitus with other circulatory complications: Secondary | ICD-10-CM

## 2022-09-01 ENCOUNTER — Ambulatory Visit: Payer: BLUE CROSS/BLUE SHIELD | Admitting: Physical Therapy

## 2022-09-07 ENCOUNTER — Ambulatory Visit: Payer: BLUE CROSS/BLUE SHIELD | Admitting: Physical Therapy

## 2022-09-07 NOTE — Progress Notes (Unsigned)
LMP 08/20/2015 (Approximate) Comment: denies preg   Subjective:    Patient ID: Yesenia Stevens, female    DOB: April 23, 1966, 57 y.o.   MRN: [id number]  HPI: Yesenia Stevens is a 57 y.o. female presenting on 09/08/2022 for comprehensive medical examination. Current medical complaints include:none  She currently lives with: Menopausal Symptoms: no  HYPERTENSION / HYPERLIPIDEMIA Satisfied with current treatment? no Duration of hypertension: years BP monitoring frequency: not checking BP range:  BP medication side effects: no Past BP meds: lisinopril Duration of hyperlipidemia: years Cholesterol medication side effects: no Cholesterol supplements: none Past cholesterol medications: none Medication compliance: excellent compliance Aspirin: no Recent stressors: no Recurrent headaches: no Visual changes: no Palpitations: no Dyspnea: no Chest pain: no Lower extremity edema: no Dizzy/lightheaded: no  DIABETES Hypoglycemic episodes:no Polydipsia/polyuria: no Visual disturbance: no Chest pain: no Paresthesias: no Glucose Monitoring: no  Accucheck frequency: Not Checking  Fasting glucose:  Post prandial:  Evening:  Before meals: Taking Insulin?: no  Long acting insulin:  Short acting insulin: Blood Pressure Monitoring: not checking Retinal Examination: Not up to Date Foot Exam: Up to Date Diabetic Education: Not Completed Pneumovax: Not up to Date Influenza: Not up to Date Aspirin: no  Depression Screen done today and results listed below:     10/11/2021    3:38 PM 03/24/2021    3:03 PM 08/12/2019    4:01 PM 07/06/2018    3:34 PM  Depression screen PHQ 2/9  Decreased Interest 0 0 0 0  Down, Depressed, Hopeless 0 0 0 0  PHQ - 2 Score 0 0 0 0  Altered sleeping 0 0  0  Tired, decreased energy 1 0  0  Change in appetite 1 0  0  Feeling bad or failure about yourself  0 0  0  Trouble concentrating 0 0  0  Moving slowly or fidgety/restless 0 0  0  Suicidal  thoughts 0 0  0  PHQ-9 Score 2 0  0  Difficult doing work/chores Not difficult at all Not difficult at all  Not difficult at all    The patient does not have a history of falls. I did complete a risk assessment for falls. A plan of care for falls was documented.   Past Medical History:  Past Medical History:  Diagnosis Date   Diabetes mellitus without complication ([place])    Hypertension     Surgical History:  Past Surgical History:  Procedure Laterality Date   CESAREAN SECTION     CHOLECYSTECTOMY     COLONOSCOPY WITH PROPOFOL N/A 12/10/2019   Procedure: COLONOSCOPY WITH PROPOFOL;  Surgeon: [name], MD;  Location: [facility] ENDOSCOPY;  Service: Gastroenterology;  Laterality: N/A;   COLONOSCOPY WITH PROPOFOL N/A 12/24/2019   Procedure: COLONOSCOPY WITH PROPOFOL;  Surgeon: [name], MD;  Location: [facility] ENDOSCOPY;  Service: Gastroenterology;  Laterality: N/A;   FRACTURE SURGERY      Medications:  Current Outpatient Medications on File Prior to Visit  Medication Sig   albuterol (PROVENTIL HFA;VENTOLIN HFA) 108 (90 Base) MCG/ACT inhaler Inhale 1-2 puffs into the lungs every 6 (six) hours as needed for wheezing or shortness of breath.   amLODipine (NORVASC) 10 MG tablet Take 1 tablet (10 mg total) by mouth daily.   empagliflozin (JARDIANCE) 10 MG TABS tablet Take 1 tablet (10 mg total) by mouth daily before breakfast.   glipiZIDE (GLUCOTROL) 10 MG tablet Take 1 tablet (10 mg total) by mouth 2 (two) times daily before a meal.   hydrochlorothiazide (HYDRODIURIL)  25 MG tablet Take 1 tablet (25 mg total) by mouth daily.   hydrocortisone 2.5 % cream SMARTSIG:sparingly Topical Twice Daily   ibuprofen (ADVIL) 600 MG tablet Take 1 tablet (600 mg total) by mouth every 6 (six) hours as needed.   metFORMIN (GLUCOPHAGE) 1000 MG tablet Take 1 tablet (1,000 mg total) by mouth 2 (two) times daily with a meal.   rosuvastatin (CRESTOR) 5 MG tablet Take 1 tablet (5 mg total) by mouth daily.    tirzepatide [facility]) 5 MG/0.5ML Pen Inject 5 mg into the skin once a week.   valsartan (DIOVAN) 80 MG tablet Take 1 tablet (80 mg total) by mouth daily.   No current facility-administered medications on file prior to visit.    Allergies:  No Known Allergies  Social History:  Social History   Socioeconomic History   Marital status: Single    Spouse name: Not on file   Number of children: Not on file   Years of education: Not on file   Highest education level: Not on file  Occupational History   Not on file  Tobacco Use   Smoking status: Never   Smokeless tobacco: Never  Vaping Use   Vaping Use: Never used  Substance and Sexual Activity   Alcohol use: Never    Alcohol/week: 0.0 standard drinks of alcohol   Drug use: Never   Sexual activity: Not Currently  Other Topics Concern   Not on file  Social History Narrative   Not on file   Social Determinants of Health   Financial Resource Strain: Low Risk  (08/24/2020)   Overall Financial Resource Strain (CARDIA)    Difficulty of Paying Living Expenses: Not very hard  Food Insecurity: Not on file  Transportation Needs: Not on file  Physical Activity: Not on file  Stress: Not on file  Social Connections: Not on file  Intimate Partner Violence: Not on file   Social History   Tobacco Use  Smoking Status Never  Smokeless Tobacco Never   Social History   Substance and Sexual Activity  Alcohol Use Never   Alcohol/week: 0.0 standard drinks of alcohol    Family History:  Family History  Problem Relation Age of Onset   Hypertension Mother    Diabetes Mother     Past medical history, surgical history, medications, allergies, family history and social history reviewed with patient today and changes made to appropriate areas of the chart.   Review of Systems  HENT:         Denies vision changes.  Eyes:  Negative for blurred vision and double vision.  Respiratory:  Negative for shortness of breath.   Cardiovascular:   Negative for chest pain, palpitations and leg swelling.  Neurological:  Negative for dizziness, tingling and headaches.  Endo/Heme/Allergies:  Negative for polydipsia.       Denies Polyuria   All other ROS negative except what is listed above and in the HPI.      Objective:    LMP 08/20/2015 (Approximate) Comment: denies preg  Wt Readings from Last 3 Encounters:  08/09/22 283 lb 8 oz (128.6 kg)  08/04/22 281 lb 11.2 oz (127.8 kg)  07/07/22 282 lb 9.6 oz (128.2 kg)    Physical Exam Vitals and nursing note reviewed.  Constitutional:      General: She is awake. She is not in acute distress.    Appearance: She is well-developed. She is obese. She is not ill-appearing.  HENT:     Head: Normocephalic and  atraumatic.     Right Ear: Hearing, tympanic membrane, ear canal and external ear normal. No drainage.     Left Ear: Hearing, tympanic membrane, ear canal and external ear normal. No drainage.     Nose: Nose normal.     Right Sinus: No maxillary sinus tenderness or frontal sinus tenderness.     Left Sinus: No maxillary sinus tenderness or frontal sinus tenderness.     Mouth/Throat:     Mouth: Mucous membranes are moist.     Pharynx: Oropharynx is clear. Uvula midline. No pharyngeal swelling, oropharyngeal exudate or posterior oropharyngeal erythema.  Eyes:     General: Lids are normal.        Right eye: No discharge.        Left eye: No discharge.     Extraocular Movements: Extraocular movements intact.     Conjunctiva/sclera: Conjunctivae normal.     Pupils: Pupils are equal, round, and reactive to light.     Visual Fields: Right eye visual fields normal and left eye visual fields normal.  Neck:     Thyroid: No thyromegaly.     Vascular: No carotid bruit.     Trachea: Trachea normal.  Cardiovascular:     Rate and Rhythm: Normal rate and regular rhythm.     Heart sounds: Normal heart sounds. No murmur heard.    No gallop.  Pulmonary:     Effort: Pulmonary effort is normal.  No accessory muscle usage or respiratory distress.     Breath sounds: Normal breath sounds.  Chest:  Breasts:    Right: Normal.     Left: Normal.  Abdominal:     General: Bowel sounds are normal.     Palpations: Abdomen is soft. There is no hepatomegaly or splenomegaly.     Tenderness: There is no abdominal tenderness.  Musculoskeletal:        General: Normal range of motion.     Cervical back: Normal range of motion and neck supple.     Right lower leg: No edema.     Left lower leg: No edema.  Lymphadenopathy:     Head:     Right side of head: No submental, submandibular, tonsillar, preauricular or posterior auricular adenopathy.     Left side of head: No submental, submandibular, tonsillar, preauricular or posterior auricular adenopathy.     Cervical: No cervical adenopathy.     Upper Body:     Right upper body: No supraclavicular, axillary or pectoral adenopathy.     Left upper body: No supraclavicular, axillary or pectoral adenopathy.  Skin:    General: Skin is warm and dry.     Capillary Refill: Capillary refill takes less than 2 seconds.     Findings: No rash.  Neurological:     Mental Status: She is alert and oriented to person, place, and time.     Gait: Gait is intact.     Deep Tendon Reflexes: Reflexes are normal and symmetric.     Reflex Scores:      Brachioradialis reflexes are 2+ on the right side and 2+ on the left side.      Patellar reflexes are 2+ on the right side and 2+ on the left side. Psychiatric:        Attention and Perception: Attention normal.        Mood and Affect: Mood normal.        Speech: Speech normal.        Behavior: Behavior normal. Behavior is  cooperative.        Thought Content: Thought content normal.        Judgment: Judgment normal.     Results for orders placed or performed in visit on 08/09/22  Comprehensive metabolic panel  Result Value Ref Range   Glucose 98 70 - 99 mg/dL   BUN 15 6 - 24 mg/dL   Creatinine, Ser 0.91 0.57 -  1.00 mg/dL   eGFR 75 >59 mL/min/1.73   BUN/Creatinine Ratio 16 9 - 23   Sodium 144 134 - 144 mmol/L   Potassium 3.7 3.5 - 5.2 mmol/L   Chloride 104 96 - 106 mmol/L   CO2 23 20 - 29 mmol/L   Calcium 9.1 8.7 - 10.2 mg/dL   Total Protein 7.5 6.0 - 8.5 g/dL   Albumin 4.1 3.8 - 4.9 g/dL   Globulin, Total 3.4 1.5 - 4.5 g/dL   Albumin/Globulin Ratio 1.2 1.2 - 2.2   Bilirubin Total 0.4 0.0 - 1.2 mg/dL   Alkaline Phosphatase 74 44 - 121 IU/L   AST 17 0 - 40 IU/L   ALT 10 0 - 32 IU/L  Hemoglobin A1c  Result Value Ref Range   Hgb A1c MFr Bld 6.0 (H) 4.8 - 5.6 %   Est. average glucose Bld gHb Est-mCnc 126 mg/dL  Lipid Panel w/o Chol/HDL Ratio  Result Value Ref Range   Cholesterol, Total 139 100 - 199 mg/dL   Triglycerides 64 0 - 149 mg/dL   HDL 48 >39 mg/dL   VLDL Cholesterol Cal 13 5 - 40 mg/dL   LDL Chol Calc (NIH) 78 0 - 99 mg/dL  Specimen status report  Result Value Ref Range   specimen status report Comment       Assessment & Plan:   Problem List Items Addressed This Visit   None    Follow up plan: No follow-ups on file.   LABORATORY TESTING:  - Pap smear: up to date  IMMUNIZATIONS:   - Tdap: Tetanus vaccination status reviewed: last tetanus booster within 10 years. - Influenza: Refused - Pneumovax: Not applicable - Prevnar: Not applicable - HPV: Not applicable - Zostavax vaccine: Refused  SCREENING: -Mammogram: Ordered today  - Colonoscopy: Up to date  - Bone Density: Not applicable  -Hearing Test: Not applicable  -Spirometry: Not applicable   PATIENT COUNSELING:   Advised to take 1 mg of folate supplement per day if capable of pregnancy.   Sexuality: Discussed sexually transmitted diseases, partner selection, use of condoms, avoidance of unintended pregnancy  and contraceptive alternatives.   Advised to avoid cigarette smoking.  I discussed with the patient that most people either abstain from alcohol or drink within safe limits (<=14/week and <=4  drinks/occasion for males, <=7/weeks and <= 3 drinks/occasion for females) and that the risk for alcohol disorders and other health effects rises proportionally with the number of drinks per week and how often a drinker exceeds daily limits.  Discussed cessation/primary prevention of drug use and availability of treatment for abuse.   Diet: Encouraged to adjust caloric intake to maintain  or achieve ideal body weight, to reduce intake of dietary saturated fat and total fat, to limit sodium intake by avoiding high sodium foods and not adding table salt, and to maintain adequate dietary potassium and calcium preferably from fresh fruits, vegetables, and low-fat dairy products.    stressed the importance of regular exercise  Injury prevention: Discussed safety belts, safety helmets, smoke detector, smoking near bedding or upholstery.   Dental  health: Discussed importance of regular tooth brushing, flossing, and dental visits.    NEXT PREVENTATIVE PHYSICAL DUE IN 1 YEAR. No follow-ups on file.

## 2022-09-08 ENCOUNTER — Other Ambulatory Visit (HOSPITAL_COMMUNITY)
Admission: RE | Admit: 2022-09-08 | Discharge: 2022-09-08 | Disposition: A | Discharge status: Home / Self Care | Payer: Medicaid Other | Source: Ambulatory Visit | Attending: Nurse Practitioner | Admitting: Nurse Practitioner

## 2022-09-08 ENCOUNTER — Other Ambulatory Visit: Payer: Self-pay | Admitting: Nurse Practitioner

## 2022-09-08 ENCOUNTER — Ambulatory Visit (INDEPENDENT_AMBULATORY_CARE_PROVIDER_SITE_OTHER): Payer: Medicaid Other | Admitting: Nurse Practitioner

## 2022-09-08 ENCOUNTER — Encounter: Payer: Self-pay | Admitting: Nurse Practitioner

## 2022-09-08 VITALS — BP 133/75 | HR 94 | Temp 99.6°F | Ht 64.0 in | Wt 285.9 lb

## 2022-09-08 DIAGNOSIS — Z0000 Encounter for general adult medical examination without abnormal findings: Secondary | ICD-10-CM

## 2022-09-08 DIAGNOSIS — E1159 Type 2 diabetes mellitus with other circulatory complications: Secondary | ICD-10-CM

## 2022-09-08 DIAGNOSIS — E119 Type 2 diabetes mellitus without complications: Secondary | ICD-10-CM | POA: Diagnosis not present

## 2022-09-08 DIAGNOSIS — E785 Hyperlipidemia, unspecified: Secondary | ICD-10-CM | POA: Diagnosis not present

## 2022-09-08 DIAGNOSIS — E1169 Type 2 diabetes mellitus with other specified complication: Secondary | ICD-10-CM | POA: Diagnosis not present

## 2022-09-08 DIAGNOSIS — I152 Hypertension secondary to endocrine disorders: Secondary | ICD-10-CM

## 2022-09-08 LAB — URINALYSIS, ROUTINE W REFLEX MICROSCOPIC
Bilirubin, UA: NEGATIVE
Ketones, UA: NEGATIVE
Leukocytes,UA: NEGATIVE
Nitrite, UA: NEGATIVE
Protein,UA: NEGATIVE
RBC, UA: NEGATIVE
Specific Gravity, UA: 1.02 (ref 1.005–1.030)
Urobilinogen, Ur: 0.2 mg/dL (ref 0.2–1.0)
pH, UA: 5 (ref 5.0–7.5)

## 2022-09-08 MED ORDER — TIRZEPATIDE 7.5 MG/0.5ML ~~LOC~~ SOAJ: 7.5000 mg | SUBCUTANEOUS | 0 refills | Status: DC

## 2022-09-08 NOTE — Assessment & Plan Note (Signed)
Recommended eating smaller high protein, low fat meals more frequently and exercising 30 mins a day 5 times a week with a goal of 10-15lb weight loss in the next 3 months. Continue with mounjaro.  Dose increased to 7.5mg  weekly.

## 2022-09-08 NOTE — Assessment & Plan Note (Signed)
Chronic.  Controlled.  Continue with current medication regimen.  Labs ordered today.  Return to clinic in 6 months for reevaluation.  Call sooner if concerns arise.  Will likely recommend Statin based on results.

## 2022-09-08 NOTE — Assessment & Plan Note (Signed)
Chronic.  Controlled.  Continue with current medication regimen of Amlodipine, HCTZ and Valsartan daily.  Labs ordered today.  Return to clinic in 6 months for reevaluation.  Call sooner if concerns arise.

## 2022-09-08 NOTE — Assessment & Plan Note (Signed)
Chronic. Yesenia Stevens is now approved. Will send in 7.5mg  dose for patient to increase to. Follow up in 3 months.  Call sooner if concerns arise.

## 2022-09-09 ENCOUNTER — Telehealth: Payer: Self-pay

## 2022-09-09 LAB — COMPREHENSIVE METABOLIC PANEL
ALT: 13 IU/L (ref 0–32)
AST: 15 IU/L (ref 0–40)
Albumin/Globulin Ratio: 1.1 — ABNORMAL LOW (ref 1.2–2.2)
Albumin: 4 g/dL (ref 3.8–4.9)
Alkaline Phosphatase: 80 IU/L (ref 44–121)
BUN/Creatinine Ratio: 14 (ref 9–23)
BUN: 12 mg/dL (ref 6–24)
Bilirubin Total: 0.4 mg/dL (ref 0.0–1.2)
CO2: 22 mmol/L (ref 20–29)
Calcium: 8.8 mg/dL (ref 8.7–10.2)
Chloride: 103 mmol/L (ref 96–106)
Creatinine, Ser: 0.84 mg/dL (ref 0.57–1.00)
Globulin, Total: 3.5 g/dL (ref 1.5–4.5)
Glucose: 117 mg/dL — ABNORMAL HIGH (ref 70–99)
Potassium: 3.9 mmol/L (ref 3.5–5.2)
Sodium: 141 mmol/L (ref 134–144)
Total Protein: 7.5 g/dL (ref 6.0–8.5)
eGFR: 82 mL/min/{1.73_m2} (ref >59)

## 2022-09-09 LAB — CBC WITH DIFFERENTIAL/PLATELET
Basophils Absolute: 0 10*3/uL (ref 0.0–0.2)
Basos: 0 %
EOS (ABSOLUTE): 0.3 10*3/uL (ref 0.0–0.4)
Eos: 3 %
Hematocrit: 36.1 % (ref 34.0–46.6)
Hemoglobin: 11.9 g/dL (ref 11.1–15.9)
Immature Grans (Abs): 0 10*3/uL (ref 0.0–0.1)
Immature Granulocytes: 0 %
Lymphocytes Absolute: 3.2 10*3/uL — ABNORMAL HIGH (ref 0.7–3.1)
Lymphs: 37 %
MCH: 27.1 pg (ref 26.6–33.0)
MCHC: 33 g/dL (ref 31.5–35.7)
MCV: 82 fL (ref 79–97)
Monocytes Absolute: 0.6 10*3/uL (ref 0.1–0.9)
Monocytes: 7 %
Neutrophils Absolute: 4.6 10*3/uL (ref 1.4–7.0)
Neutrophils: 53 %
Platelets: 347 10*3/uL (ref 150–450)
RBC: 4.39 x10E6/uL (ref 3.77–5.28)
RDW: 15.1 % (ref 11.7–15.4)
WBC: 8.7 10*3/uL (ref 3.4–10.8)

## 2022-09-09 LAB — LIPID PANEL
Chol/HDL Ratio: 3 ratio (ref 0.0–4.4)
Cholesterol, Total: 140 mg/dL (ref 100–199)
HDL: 46 mg/dL (ref >39)
LDL Chol Calc (NIH): 78 mg/dL (ref 0–99)
Triglycerides: 85 mg/dL (ref 0–149)
VLDL Cholesterol Cal: 16 mg/dL (ref 5–40)

## 2022-09-09 LAB — TSH: TSH: 1.54 u[IU]/mL (ref 0.450–4.500)

## 2022-09-09 NOTE — Progress Notes (Signed)
Please let patient know that her lab work looks good.  No concerns at this time.  Continue with current medication regimen.  Follow up as discussed.  Her Pap results will be back in a couple of days.

## 2022-09-09 NOTE — Telephone Encounter (Signed)
Requested Prescriptions  Pending Prescriptions Disp Refills   rosuvastatin (CRESTOR) 5 MG tablet [Pharmacy Med Name: Rosuvastatin Calcium 5 MG Oral Tablet] 90 tablet 3    Sig: Take 1 tablet by mouth once daily     Cardiovascular:  Antilipid - Statins 2 Failed - 09/08/2022  6:52 PM      Failed - Lipid Panel in normal range within the last 12 months    Cholesterol, Total  Date Value Ref Range Status  09/08/2022 140 100 - 199 mg/dL Final   LDL Chol Calc (NIH)  Date Value Ref Range Status  09/08/2022 78 0 - 99 mg/dL Final   LDL Direct  Date Value Ref Range Status  06/25/2020 108 (H) 0 - 99 mg/dL Final   HDL  Date Value Ref Range Status  09/08/2022 46 >39 mg/dL Final   Triglycerides  Date Value Ref Range Status  09/08/2022 85 0 - 149 mg/dL Final         Passed - Cr in normal range and within 360 days    Creatinine, Ser  Date Value Ref Range Status  09/08/2022 0.84 0.57 - 1.00 mg/dL Final         Passed - Patient is not pregnant      Passed - Valid encounter within last 12 months    Recent Outpatient Visits           Yesterday Annual physical exam   [place] Jon Billings, NP   1 month ago Hyperlipidemia associated with type 2 diabetes mellitus ([place])   [place] Jon Billings, NP   1 month ago Forearm sprain, right, initial encounter   [place], Dani Gobble, PA-C   2 months ago Abrasion of back, unspecified laterality, initial encounter   [place], Erin E, PA-C   3 months ago Type 2 diabetes mellitus without complication, without long-term current use of insulin ([place])   [place] Jon Billings, NP       Future Appointments             In 2 months Jon Billings, NP [place]

## 2022-09-09 NOTE — Telephone Encounter (Signed)
PA for [facility] initiated and submitted via Cover My Meds. Key: [id number]

## 2022-09-12 LAB — CYTOLOGY - PAP
Adequacy: ABSENT
Diagnosis: NEGATIVE

## 2022-09-12 NOTE — Telephone Encounter (Signed)
PA has been approved

## 2022-09-13 MED ORDER — METRONIDAZOLE 500 MG PO TABS: 500.0000 mg | ORAL_TABLET | Freq: Two times a day (BID) | ORAL | 0 refills | Status: AC

## 2022-09-13 NOTE — Addendum Note (Signed)
Addended by: Jon Billings on: 09/13/2022 08:03 AM   Modules accepted: Orders

## 2022-09-13 NOTE — Telephone Encounter (Signed)
Yesenia Stevens from [place] is calling to check on the status of the Disability paper work, he stated he will refax the papers to [phone] fax  Please advise

## 2022-09-13 NOTE — Telephone Encounter (Signed)
Forms received again today. We do not do long term disability in this office.

## 2022-09-13 NOTE — Progress Notes (Signed)
Please let patient know that her PAP looks good.  She did have bacterial vaginosis which is an overgrowth of normal bacteria.  I have sent a prescription for flagyl to the pharmacy to treat this.  No other concerns at this time.

## 2022-09-20 ENCOUNTER — Telehealth: Payer: Self-pay | Admitting: Nurse Practitioner

## 2022-09-20 NOTE — Telephone Encounter (Unsigned)
Yesenia Stevens from [facility] is calling in checking on the status of a medical records request he faxed over. Yesenia Stevens says he received the medical records but there was a form that needed to be completed by the PCP that wasn't completed. Please advise.

## 2022-09-21 ENCOUNTER — Ambulatory Visit: Payer: BLUE CROSS/BLUE SHIELD | Attending: Trauma Surgery | Admitting: Physical Therapy

## 2022-09-21 ENCOUNTER — Encounter: Payer: Self-pay | Admitting: Physical Therapy

## 2022-09-21 DIAGNOSIS — M25652 Stiffness of left hip, not elsewhere classified: Secondary | ICD-10-CM

## 2022-09-21 DIAGNOSIS — R29898 Other symptoms and signs involving the musculoskeletal system: Secondary | ICD-10-CM | POA: Diagnosis present

## 2022-09-21 DIAGNOSIS — M25552 Pain in left hip: Secondary | ICD-10-CM | POA: Diagnosis present

## 2022-09-21 DIAGNOSIS — R2689 Other abnormalities of gait and mobility: Secondary | ICD-10-CM | POA: Diagnosis present

## 2022-09-21 DIAGNOSIS — R269 Unspecified abnormalities of gait and mobility: Secondary | ICD-10-CM

## 2022-09-21 DIAGNOSIS — M6281 Muscle weakness (generalized): Secondary | ICD-10-CM | POA: Diagnosis present

## 2022-09-21 NOTE — Therapy (Signed)
OUTPATIENT PHYSICAL THERAPY THORACOLUMBAR TREATMENT  Patient Name: Yesenia Stevens MRN: [id number] DOB:06-29-1965, 57 y.o., female Today's Date: 09/21/2022   PT End of Session - 09/21/22 1035     Visit Number 48    Number of Visits 56    Date for PT Re-Evaluation 10/12/22    PT Start Time 1026    PT Stop Time 1113    PT Time Calculation (min) 47 min    Activity Tolerance Patient tolerated treatment well;Patient limited by pain    Behavior During Therapy [facility] for tasks assessed/performed            Past Medical History:  Diagnosis Date   Diabetes mellitus without complication    Hypertension    Past Surgical History:  Procedure Laterality Date   CESAREAN SECTION     CHOLECYSTECTOMY     COLONOSCOPY WITH PROPOFOL N/A 12/10/2019   Procedure: COLONOSCOPY WITH PROPOFOL;  Surgeon: [name], MD;  Location: [facility] ENDOSCOPY;  Service: Gastroenterology;  Laterality: N/A;   COLONOSCOPY WITH PROPOFOL N/A 12/24/2019   Procedure: COLONOSCOPY WITH PROPOFOL;  Surgeon: [name], MD;  Location: [facility] ENDOSCOPY;  Service: Gastroenterology;  Laterality: N/A;   FRACTURE SURGERY     Patient Active Problem List   Diagnosis Date Noted   Hyperlipidemia associated with type 2 diabetes mellitus 09/22/2021   Displaced fracture of base of neck of right femur, sequela 07/16/2021   Closed fracture of posterior wall of left acetabulum with routine healing 06/16/2021   Palpitations 03/19/2020   Diarrhea 03/19/2020   Premature atrial contractions 08/20/2018   Morbid (severe) obesity due to excess calories 08/01/2018   Shortness of breath 07/26/2018   Chronic cough [id number]   Uncomplicated type 2 diabetes mellitus 03/22/2018   Hypertension associated with diabetes 03/22/2018    PCP: Jon Billings, NP   REFERRING PROVIDER: Katherine Mantle, MD  REFERRING DIAG:  Diagnosis  S32.15XD (ICD-10-CM) - Type 2 fracture of sacrum, subsequent encounter for fracture with routine healing   S32.422D (ICD-10-CM) - Displaced fracture of posterior wall of left acetabulum, subsequent encounter for fracture with routine healing    Rationale for Evaluation and Treatment Rehabilitation  THERAPY DIAG:  Stiffness of left hip joint  Gait difficulty  Pain in left hip  Balance problems  Muscle weakness (generalized)  Impaired strength of hip muscles  ONSET DATE: 05/28/21  SUBJECTIVE:                                                                                                                                                                                            PERTINENT HISTORY:  Hx of L hip dislocation, and  screws placed in lower back after her injury. Pt left hospital in a walker, and utilized a WC originally during [facility]. Pt has decreased ankle DF in L ankle, and pain in her lateral foot / 5th metatarsal. Pt had L knee scope, and has tenderness/bruising in L LE along tibia.  PAIN:  Are you having pain? NPRS scale: 4-5/10 Pain location: L hip (anterior) Pain description: popping and dull/achy Aggravating factors: standing, bending forward (especially left) Relieving factors: sitting down, tylenol PRN   PRECAUTIONS: Anterior hip, Posterior hip, Knee, and Fall  WEIGHT BEARING RESTRICTIONS no  FALLS:  Has patient fallen in last 6 months Yes. Number of falls 1  LIVING ENVIRONMENT: Lives with: lives with their family Lives in: House/apartment Stairs: yes - 2 to get into home with raining Has following equipment at home: Quad cane large base and Wheelchair (manual)  OCCUPATION:  Pt is currently on long term disability. Previously worked at [employer] prior to injury. Would like to get back to work if the company can find a position that is more stationary.  PLOF: Independent  PATIENT GOALS Pt wants to alleviate pain, become more mobile, strengthen back musculature, strengthen LEs  OBJECTIVE:   DIAGNOSTIC FINDINGS:  LUMBAR SPINE - 2-3 VIEW    COMPARISON:  MRI 09/28/2009   FINDINGS: 4-5 mm retrolisthesis L5-S1, slightly progressive since prior examination, possibly degenerative in nature. Otherwise normal lumbar lordosis. No acute fracture of the lumbar spine. Vertebral body height is preserved. Intervertebral disc space narrowing and endplate remodeling at 075-GRM and L5-S1 is in keeping with mild degenerative disc disease at these levels. Paraspinal soft tissues are unremarkable. Right sacroiliac arthrodesis has been performed with 2 partially threaded screws.   IMPRESSION: No acute fracture or traumatic listhesis.   Degenerative changes L4-S1 with 5 mm retrolisthesis L5-S1.     Electronically Signed   By: Fidela Salisbury M.D.   On: 09/17/2021 22:13  PATIENT SURVEYS:  FOTO 52 ; 61  SCREENING FOR RED FLAGS: Bowel or bladder incontinence: No Spinal tumors: No Cauda equina syndrome: No Compression fracture: No Abdominal aneurysm: No  COGNITION:  Overall cognitive status: Within functional limits for tasks assessed     SENSATION: Not tested - patient reports altered sensation in L LE/foot  POSTURE: rounded shoulders, forward head, and weight shift left  PALPATION: Moderate L medial lower leg tenderness with palpation  LUMBAR ROM:   Active  AROM  eval  Flexion 50% limited (compensation to R)  Extension 75% limited (pain)  Right lateral flexion   Left lateral flexion   Right rotation 50% limited  Left rotation 25% limited   (Blank rows = not tested)  LOWER EXTREMITY ROM:     PROM/AROM Right eval Left eval  Hip flexion WFL 119/ 90 deg.  Hip extension    Hip abduction [facility]  Hip adduction    Hip internal rotation    Hip external rotation    Knee flexion WFL 106/ 99 deg.  Knee extension    Ankle dorsiflexion WFL Limited by pain  Ankle plantarflexion WFL Limited by pain  Ankle inversion    Ankle eversion     (Blank rows = not tested)  LOWER EXTREMITY MMT:    MMT Right eval Left eval   Hip flexion 4/5 3/5  Hip extension    Hip abduction 4/5 3/5  Hip adduction 4/5 4/5  Hip internal rotation 5/5 unable  Hip external rotation 5/5 3/5  Knee flexion 5/5 3/5 (pain)  Knee extension  5/5 4/5  Ankle dorsiflexion 4+/5 3/5  Ankle plantarflexion 5/5 4/5  Ankle inversion    Ankle eversion     (Blank rows = not tested)  FUNCTIONAL TESTS:  5 times sit to stand: 25.4 seconds .  9/21:  14.06 seconds (marked improvement).    GAIT: Distance walked: in clinic Assistive device utilized: Quad cane small base Level of assistance: Modified independence Comments: R sided Trendelenburg.  L antalgic gait pattern with decrease stance on L with short R LE step length/ shuffling.  No arm swing. Walks with wide base of support, with mild supination of B feet. Pt had limited endurance, and significantly limited L hip flexion and DF to clear her foot during swing phase.     01/25/22: 5xSTS: 15.94 sec./ 14.94 sec.  (Marked improvement).      9/21:  5xSTS: 14.06 seconds (marked improvement).      11/8: 5xSTS: 12.48 seconds  (improvement).      06/24/22: 9.89 seconds (marked improvement).    2/28: L/R LE strength testing: hip flexion 3-/4, quad 4+/4+, R hip abduction 4-/3+.  L knee flexion 119 deg.   TREATMENT:  09/21/2022  Subjective:   Pt. Arrived to therapy reporting L ant. Hip pain of 3/10 on NPS. Pt. Has been going to gym at least 1x/week.  Pt. Went to [place] and did more walking than normal.  Pt. States the more she works her L hip, the less stiff her hip feels.  Pt. States her L hip stays stiff with prolonged sitting/ driving.    Treatment:      There.ex.:      Nustep B UE/LE for 10 min at L5 (discussed trip to [employer] ex.).      Walking around PT gym with/ without use of SPC.     Forward/backwards/lateral walking in //-bars 5 laps each.       6" step touches with B UE assist and progressing to light UE assist in //-bars.  Cuing to increase L hip flexion (heel/ toe strike)- 20x  each.     No ankle wts.: LAQ/ seated marching/ standing hip extension/ lateral walking 10x2.  High marching in //-bars with cuing to correct posture/ mirror feedback.  5 laps.      Walking in gym/ clinic without use of SPC working on consistent stance time on L as compared to R LE.      Discussed HEP/ gym based ex. Program. PT encourages pool exercise at local [employer] to decrease force on L hip while encouraging movement.   PATIENT EDUCATION:  Education details: Access Code: [phone] Person educated: Patient Education method: Explanation, Demonstration, and Handouts Education comprehension: verbalized understanding and returned demonstration   HOME EXERCISE PROGRAM: Access Code: [email or web address] URL: [email or web address] Date: 11/25/2021 Prepared by: Dorcas Carrow  Exercises - Straight Leg Raise  - 1 x daily - 7 x weekly - 2 sets - 10 reps - Supine Heel Slide  - 1 x daily - 7 x weekly - 2 sets - 10 reps - Supine Hip Adduction Isometric with Ball  - 1 x daily - 7 x weekly - 2 sets - 10 reps - Hooklying Gluteal Sets  - 1 x daily - 7 x weekly - 2 sets - 10 reps - Seated Heel Raise  - 1 x daily - 7 x weekly - 2 sets - 10 reps - Seated Heel Toe Raises  - 1 x daily - 7 x weekly - 2 sets - 10 reps  Access Code: [email or web address]  URL: [email or web address] Date: 06/27/2022 Prepared by: Dorcas Carrow  Exercises - Straight Leg Raise  - 1 x daily - 7 x weekly - 2 sets - 10 reps - Supine Hip Adduction Isometric with Ball  - 1 x daily - 7 x weekly - 2 sets - 10 reps - Hooklying Gluteal Sets  - 1 x daily - 7 x weekly - 2 sets - 10 reps - Standing Hip Abduction  - 1 x daily - 7 x weekly - 2 sets - 12 reps - Mini Squat with Counter Support  - 1 x daily - 7 x weekly - 2 sets - 12 reps  ASSESSMENT:  CLINICAL IMPRESSION:  Pt presented today with SPC with L antalgic gait.  Pt. Remains limited with moderate L hip pain with increase activity/ walking/ getting into car.  Pt's pain  fluctuates during tx. and she takes meds for relief. Pt provided with rest breaks as needed to control pain. Pt demonstrates slow but steady progress and continues to demonstrate proximal weakness > distal, high fall risk and fair endurance.  Pt will benefit from continue PT interventions to address pt impairments in order to achieve pt's goal.   OBJECTIVE IMPAIRMENTS Abnormal gait, decreased activity tolerance, decreased balance, decreased endurance, decreased mobility, difficulty walking, decreased ROM, decreased strength, hypomobility, impaired flexibility, impaired sensation, improper body mechanics, postural dysfunction, obesity, and pain.   ACTIVITY LIMITATIONS carrying, lifting, bending, sitting, standing, squatting, sleeping, stairs, transfers, bed mobility, dressing, and locomotion level  PARTICIPATION LIMITATIONS: cleaning, laundry, driving, shopping, community activity, occupation, and yard work  PERSONAL FACTORS Age, Education, and Past/current experiences are also affecting patient's functional outcome.   REHAB POTENTIAL: Good  CLINICAL DECISION MAKING: Evolving/moderate complexity  EVALUATION COMPLEXITY: Moderate   GOALS: Goals reviewed with patient Yes  SHORT TERM GOALS: Target date: 07/22/22  Pt will ambulate through the clinic with a symmetrical gait pattern, with equal step length and decreased pain for > 100 ft.  Baseline: Asymmetrical gait - increased L step length and decreased R step length Goal status: MET   LONG TERM GOALS: Target date: 10/12/22  Pt will increase FOTO score to > 58 Baseline: 9/7: 52 (no significant changes).  11/8: 49 (slight regression secondary to increase L anterior hip pain with stair climbing/ increase distance walked.  1/16: 46 (continued regression/ pt. Considering THA) Goal status: Not met  2.  Pt will decrease pain level to < 2/10 during all ADLs, in order to efficiently accomplish activities with decreased pain and increased  independence Baseline: see above Goal status: Partially met  3.  Pt will increase Active L knee flexion ROM to > 120 degrees. Baseline: 99, 106 passive.  8/8: 118 deg.  10/25: 118 deg. 2/28: 119 deg.  Goal status: Partially met  4.  Pt. Will increase B LE muscle strength 1/2 muscle grade to improve stepping up into drivers side of car.    Baseline: see above  Goal status: Initial  PLAN: PT FREQUENCY: 1x/week  PT DURATION: 8 weeks  PLANNED INTERVENTIONS: Therapeutic exercises, Therapeutic activity, Neuromuscular re-education, Balance training, Gait training, Patient/Family education, Joint mobilization, Stair training, Aquatic Therapy, Cryotherapy, Moist heat, and Manual therapy.  PLAN FOR NEXT SESSION:  More standing dynamic ex., functional activity conditioning (walking, steps).   Pura Spice, PT, DPT # [phone] 12:26 PM,09/21/22  [facility] [address] [place], [place], [address] Phone: [phone]   Fax:  [phone]

## 2022-09-28 ENCOUNTER — Encounter: Payer: Self-pay | Admitting: Physical Therapy

## 2022-09-28 ENCOUNTER — Ambulatory Visit: Payer: BLUE CROSS/BLUE SHIELD | Admitting: Physical Therapy

## 2022-09-28 DIAGNOSIS — M25652 Stiffness of left hip, not elsewhere classified: Secondary | ICD-10-CM | POA: Diagnosis not present

## 2022-09-28 DIAGNOSIS — R269 Unspecified abnormalities of gait and mobility: Secondary | ICD-10-CM

## 2022-09-28 DIAGNOSIS — M6281 Muscle weakness (generalized): Secondary | ICD-10-CM

## 2022-09-28 DIAGNOSIS — M25552 Pain in left hip: Secondary | ICD-10-CM

## 2022-09-28 DIAGNOSIS — R29898 Other symptoms and signs involving the musculoskeletal system: Secondary | ICD-10-CM

## 2022-09-28 DIAGNOSIS — R2689 Other abnormalities of gait and mobility: Secondary | ICD-10-CM

## 2022-09-28 NOTE — Therapy (Signed)
OUTPATIENT PHYSICAL THERAPY THORACOLUMBAR TREATMENT  Patient Name: Yesenia Stevens MRN: [id number] DOB:1965/07/26, 57 y.o., female Today's Date: 09/28/2022   PT End of Session - 09/28/22 1035     Visit Number 49    Number of Visits 54    Date for PT Re-Evaluation 10/12/22    PT Start Time 1033    PT Stop Time 1118    PT Time Calculation (min) 45 min    Activity Tolerance Patient tolerated treatment well;Patient limited by pain    Behavior During Therapy [facility] for tasks assessed/performed            Past Medical History:  Diagnosis Date   Diabetes mellitus without complication    Hypertension    Past Surgical History:  Procedure Laterality Date   CESAREAN SECTION     CHOLECYSTECTOMY     COLONOSCOPY WITH PROPOFOL N/A 12/10/2019   Procedure: COLONOSCOPY WITH PROPOFOL;  Surgeon: Wyline Mood, MD;  Location: [facility] ENDOSCOPY;  Service: Gastroenterology;  Laterality: N/A;   COLONOSCOPY WITH PROPOFOL N/A 12/24/2019   Procedure: COLONOSCOPY WITH PROPOFOL;  Surgeon: Wyline Mood, MD;  Location: [facility] ENDOSCOPY;  Service: Gastroenterology;  Laterality: N/A;   FRACTURE SURGERY     Patient Active Problem List   Diagnosis Date Noted   Hyperlipidemia associated with type 2 diabetes mellitus 09/22/2021   Displaced fracture of base of neck of right femur, sequela 07/16/2021   Closed fracture of posterior wall of left acetabulum with routine healing 06/16/2021   Palpitations 03/19/2020   Diarrhea 03/19/2020   Premature atrial contractions 08/20/2018   Morbid (severe) obesity due to excess calories 08/01/2018   Shortness of breath 07/26/2018   Chronic cough 03/22/2018   Uncomplicated type 2 diabetes mellitus 03/22/2018   Hypertension associated with diabetes 03/22/2018    PCP: Larae Grooms, NP   REFERRING PROVIDER: Ernst Bowler, MD  REFERRING DIAG:  Diagnosis  S32.15XD (ICD-10-CM) - Type 2 fracture of sacrum, subsequent encounter for fracture with routine healing   S32.422D (ICD-10-CM) - Displaced fracture of posterior wall of left acetabulum, subsequent encounter for fracture with routine healing    Rationale for Evaluation and Treatment Rehabilitation  THERAPY DIAG:  Stiffness of left hip joint  Gait difficulty  Pain in left hip  Balance problems  Muscle weakness (generalized)  Impaired strength of hip muscles  Stiffness of left hip, not elsewhere classified  ONSET DATE: 05/28/21  SUBJECTIVE:                                                                                                                                                                                            PERTINENT  HISTORY:  Hx of L hip dislocation, and screws placed in lower back after her injury. Pt left hospital in a walker, and [facility]. Pt has decreased ankle DF in L ankle, and pain in her lateral foot / 5th metatarsal. Pt had L knee scope, and has tenderness/bruising in L LE along tibia.  PAIN:  Are you having pain? NPRS scale: 4-5/10 Pain location: L hip (anterior) Pain description: popping and dull/achy Aggravating factors: standing, bending forward (especially left) Relieving factors: sitting down, tylenol PRN   PRECAUTIONS: Anterior hip, Posterior hip, Knee, and Fall  WEIGHT BEARING RESTRICTIONS no  FALLS:  Has patient fallen in last 6 months Yes. Number of falls 1  LIVING ENVIRONMENT: Lives with: lives with their family Lives in: House/apartment Stairs: yes - 2 to get into home with raining Has following equipment at home: Quad cane large base and Wheelchair (manual)  OCCUPATION:  Pt is currently on long term disability. Previously worked at [employer] prior to injury. Would like to get back to work if the company can find a position that is more stationary.  PLOF: Independent  PATIENT GOALS Pt wants to alleviate pain, become more mobile, strengthen back musculature, strengthen LEs  OBJECTIVE:   DIAGNOSTIC  FINDINGS:  LUMBAR SPINE - 2-3 VIEW   COMPARISON:  MRI 09/28/2009   FINDINGS: 4-5 mm retrolisthesis L5-S1, slightly progressive since prior examination, possibly degenerative in nature. Otherwise normal lumbar lordosis. No acute fracture of the lumbar spine. Vertebral body height is preserved. Intervertebral disc space narrowing and endplate remodeling at L4-5 and L5-S1 is in keeping with mild degenerative disc disease at these levels. Paraspinal soft tissues are unremarkable. Right sacroiliac arthrodesis has been performed with 2 partially threaded screws.   IMPRESSION: No acute fracture or traumatic listhesis.   Degenerative changes L4-S1 with 5 mm retrolisthesis L5-S1.     Electronically Signed   By: Helyn NumbersAshesh  Parikh M.D.   On: 09/17/2021 22:13  PATIENT SURVEYS:  FOTO 52 ; 61  SCREENING FOR RED FLAGS: Bowel or bladder incontinence: No Spinal tumors: No Cauda equina syndrome: No Compression fracture: No Abdominal aneurysm: No  COGNITION:  Overall cognitive status: Within functional limits for tasks assessed     SENSATION: Not tested - patient reports altered sensation in L LE/foot  POSTURE: rounded shoulders, forward head, and weight shift left  PALPATION: Moderate L medial lower leg tenderness with palpation  LUMBAR ROM:   Active  AROM  eval  Flexion 50% limited (compensation to R)  Extension 75% limited (pain)  Right lateral flexion   Left lateral flexion   Right rotation 50% limited  Left rotation 25% limited   (Blank rows = not tested)  LOWER EXTREMITY ROM:     PROM/AROM Right eval Left eval  Hip flexion WFL 119/ 90 deg.  Hip extension    [facility]duction [facility]  Hip adduction    Hip internal rotation    Hip external rotation    Knee flexion WFL 106/ 99 deg.  Knee extension    Ankle dorsiflexion WFL Limited by pain  Ankle plantarflexion WFL Limited by pain  Ankle inversion    Ankle eversion     (Blank rows = not tested)  LOWER EXTREMITY  MMT:    MMT Right eval Left eval  Hip flexion 4/5 3/5  Hip extension    Hip abduction 4/5 3/5  Hip adduction 4/5 4/5  Hip internal rotation 5/5 unable  Hip external rotation 5/5 3/5  Knee flexion 5/5 3/5 (pain)  Knee extension 5/5 4/5  Ankle dorsiflexion 4+/5 3/5  Ankle plantarflexion 5/5 4/5  Ankle inversion    Ankle eversion     (Blank rows = not tested)  FUNCTIONAL TESTS:  5 times sit to stand: 25.4 seconds .  9/21:  14.06 seconds (marked improvement).    GAIT: Distance walked: in clinic Assistive device utilized: Quad cane small base Level of assistance: Modified independence Comments: R sided Trendelenburg.  L antalgic gait pattern with decrease stance on L with short R LE step length/ shuffling.  No arm swing. Walks with wide base of support, with mild supination of B feet. Pt had limited endurance, and significantly limited L hip flexion and DF to clear her foot during swing phase.     01/25/22: 5xSTS: 15.94 sec./ 14.94 sec.  (Marked improvement).      9/21:  5xSTS: 14.06 seconds (marked improvement).      11/8: 5xSTS: 12.48 seconds  (improvement).      06/24/22: 9.89 seconds (marked improvement).    2/28: L/R LE strength testing: hip flexion 3-/4, quad 4+/4+, R hip abduction 4-/3+.  L knee flexion 119 deg.   TREATMENT:  09/28/2022  Subjective:   Pt. Continues to c/o 3/10 L hip pain with walking/ standing tasks.  Pt. Has been going to gym at least 1x/week and went last Saturday.  PT discussed ex. Equipment/ aquatic ex.    Treatment:      There.ex.:        Reassessment of LE muscle strength: R hip/knee strength grossly 5/5 MMT except hip abduction 4+/5.  L hip flexion 3+/5 MMT, hip abduction 4-/5, adduction 4/5, extension 4/5 MMT.      STS from gray chair 15x with no UE assist.  Equal wt. Bearing on L/R with good upright posture.      Forward/backwards/lateral walking in //-bars 5 laps each.  Min. UE assist and progressing to no UE assist.       Nustep B UE/LE  for 10 min at L5.  Discussed gym based ex.       Standing on Airex: wt. Shifting/ 6" step touches with R UE assist and min. cuing to increase L hip flexion (heel/ toe strike)- 20x each.     No ankle wts.: LAQ/ seated marching 20x.  High marching in //-bars with cuing to correct posture/ mirror feedback.  5 laps.      Discussed HEP/ gym based ex. Program. PT encourages pool exercise at local [employer] to decrease force on L hip while encouraging movement.   PATIENT EDUCATION:  Education details: Access Code: [phone] Person educated: Patient Education method: Explanation, Demonstration, and Handouts Education comprehension: verbalized understanding and returned demonstration   HOME EXERCISE PROGRAM: Access Code: [email or web address] URL: [email or web address] Date: 11/25/2021 Prepared by: Dorene Grebe  Exercises - Straight Leg Raise  - 1 x daily - 7 x weekly - 2 sets - 10 reps - Supine Heel Slide  - 1 x daily - 7 x weekly - 2 sets - 10 reps - Supine Hip Adduction Isometric with Ball  - 1 x daily - 7 x weekly - 2 sets - 10 reps - Hooklying Gluteal Sets  - 1 x daily - 7 x weekly - 2 sets - 10 reps - Seated Heel Raise  - 1 x daily - 7 x weekly - 2 sets - 10 reps - Seated Heel Toe Raises  - 1 x daily - 7 x weekly - 2 sets - 10  reps  Access Code: [email or web address] URL: [email or web address] Date: 06/27/2022 Prepared by: Dorene Grebe  Exercises - Straight Leg Raise  - 1 x daily - 7 x weekly - 2 sets - 10 reps - Supine Hip Adduction Isometric with Ball  - 1 x daily - 7 x weekly - 2 sets - 10 reps - Hooklying Gluteal Sets  - 1 x daily - 7 x weekly - 2 sets - 10 reps - Standing Hip Abduction  - 1 x daily - 7 x weekly - 2 sets - 12 reps - Mini Squat with Counter Support  - 1 x daily - 7 x weekly - 2 sets - 12 reps  ASSESSMENT:  CLINICAL IMPRESSION:  Pt presented today with SPC with L antalgic gait.  Pt. Remains limited with moderate L hip pain with increase activity/ walking/  getting into car.  Pt. Has difficulty lifting L LE into driver's side seat without UE assist.  Pt's pain fluctuates during tx. and she takes meds for relief. Pt provided with rest breaks as needed to control pain. Pt demonstrates slow but steady progress and continues to demonstrate proximal weakness > distal, high fall risk and fair endurance.  Pt will benefit from continue PT interventions to address pt impairments in order to achieve pt's goal.   OBJECTIVE IMPAIRMENTS Abnormal gait, decreased activity tolerance, decreased balance, decreased endurance, decreased mobility, difficulty walking, decreased ROM, decreased strength, hypomobility, impaired flexibility, impaired sensation, improper body mechanics, postural dysfunction, obesity, and pain.   ACTIVITY LIMITATIONS carrying, lifting, bending, sitting, standing, squatting, sleeping, stairs, transfers, bed mobility, dressing, and locomotion level  PARTICIPATION LIMITATIONS: cleaning, laundry, driving, shopping, community activity, occupation, and yard work  PERSONAL FACTORS Age, Education, and Past/current experiences are also affecting patient's functional outcome.   REHAB POTENTIAL: Good  CLINICAL DECISION MAKING: Evolving/moderate complexity  EVALUATION COMPLEXITY: Moderate   GOALS: Goals reviewed with patient Yes  SHORT TERM GOALS: Target date: 07/22/22  Pt will ambulate through the clinic with a symmetrical gait pattern, with equal step length and decreased pain for > 100 ft.  Baseline: Asymmetrical gait - increased L step length and decreased R step length Goal status: MET   LONG TERM GOALS: Target date: 10/12/22  Pt will increase FOTO score to > 58 Baseline: 9/7: 52 (no significant changes).  11/8: 49 (slight regression secondary to increase L anterior hip pain with stair climbing/ increase distance walked.  1/16: 46 (continued regression/ pt. Considering THA) Goal status: Not met  2.  Pt will decrease pain level to < 2/10  during all ADLs, in order to efficiently accomplish activities with decreased pain and increased independence Baseline: see above Goal status: Partially met  3.  Pt will increase Active L knee flexion ROM to > 120 degrees. Baseline: 99, 106 passive.  8/8: 118 deg.  10/25: 118 deg. 2/28: 119 deg.  Goal status: Partially met  4.  Pt. Will increase B LE muscle strength 1/2 muscle grade to improve stepping up into drivers side of car.    Baseline: see above  Goal status: Initial  PLAN: PT FREQUENCY: 1x/week  PT DURATION: 8 weeks  PLANNED INTERVENTIONS: Therapeutic exercises, Therapeutic activity, Neuromuscular re-education, Balance training, Gait training, Patient/Family education, Joint mobilization, Stair training, Aquatic Therapy, Cryotherapy, Moist heat, and Manual therapy.  PLAN FOR NEXT SESSION:  More standing dynamic ex., functional activity conditioning (walking, steps).   Cammie Mcgee, PT, DPT # [phone] 12:53 PM,09/28/22  [facility] [address] [place], [place],  [address] Phone: [phone]   Fax:  [phone]

## 2022-10-03 ENCOUNTER — Ambulatory Visit: Payer: Self-pay

## 2022-10-03 NOTE — Telephone Encounter (Signed)
Chief Complaint: Sore throat & Diarrhea Symptoms: above Frequency: 1 week for sore throat and since Saturday for stomach issues Pertinent Negatives: Patient denies fever Disposition: [] ED /[] Urgent Care (no appt availability in office) / [x] Appointment(In office/virtual)/ []  [place] [facility]/ [] Home Care/ [] Refused Recommended Disposition /[] [place] [facility]/ []  Follow-up with PCP Additional Notes: Pt has had exposure to strep from god-children. And has had a sore throat for over 1 week.  Pt ate bad chicken on Saturday and has had a lot of gas and diarrhea since Saturday afternoon.  Pt states that this seems to be resolving.  Pt has appt for tomorrow.    Summary: sore throat, ate bad chicken   Pt states that she has been having a sore throat for a week and it hurts when she drinks anything. Pt also thinks she ate bad chicken yesterday as well. Pt has an appt scheduled for 10/04/22. Pt is wanting to know if medication could be  called in for her today. Please advise.     Reason for Disposition  [1] Sore throat is the only symptom AND [2] present > 48 hours  [1] MODERATE pain (e.g., interferes with normal activities) AND [2] pain comes and goes (cramps) AND [3] present > 24 hours  (Exception: Pain with Vomiting or Diarrhea - see that Guideline.)  Answer Assessment - Initial Assessment Questions 1. ONSET: "When did the throat start hurting?" (Hours or days ago)      1 week 2. SEVERITY: "How bad is the sore throat?" (Scale 1-10; mild, moderate or severe)   - MILD (1-3):  Doesn't interfere with eating or normal activities.   - MODERATE (4-7): Interferes with eating some solids and normal activities.   - SEVERE (8-10):  Excruciating pain, interferes with most normal activities.   - SEVERE WITH DYSPHAGIA (10): Can't swallow liquids, drooling.     4-5/10 3. STREP EXPOSURE: "Has there been any exposure to strep within the past week?" If Yes, ask: "What type of contact occurred?"       Possible - yes some children 4.  VIRAL SYMPTOMS: "Are there any symptoms of a cold, such as a runny nose, cough, hoarse voice or red eyes?"      no 5. FEVER: "Do you have a fever?" If Yes, ask: "What is your temperature, how was it measured, and when did it start?"     no 6. PUS ON THE TONSILS: "Is there pus on the tonsils in the back of your throat?"     Have not looked 7. OTHER SYMPTOMS: "Do you have any other symptoms?" (e.g., difficulty breathing, headache, rash)     no  Answer Assessment - Initial Assessment Questions 1. LOCATION: "Where does it hurt?"      Abdomen 2. RADIATION: "Does the pain shoot anywhere else?" (e.g., chest, back)     no 3. ONSET: "When did the pain begin?" (e.g., minutes, hours or days ago)      Saturday 4. SUDDEN: "Gradual or sudden onset?"     Sudden 5. PATTERN "Does the pain come and go, or is it constant?"    - If it comes and goes: "How long does it last?" "Do you have pain now?"     (Note: Comes and goes means the pain is intermittent. It goes away completely between bouts.)    - If constant: "Is it getting better, staying the same, or getting worse?"      (Note: Constant means the pain never goes away completely;  most serious pain is constant and gets worse.)      Comes and goes. 6. SEVERITY: "How bad is the pain?"  (e.g., Scale 1-10; mild, moderate, or severe)    - MILD (1-3): Doesn't interfere with normal activities, abdomen soft and not tender to touch.     - MODERATE (4-7): Interferes with normal activities or awakens from sleep, abdomen tender to touch.     - SEVERE (8-10): Excruciating pain, doubled over, unable to do any normal activities.       Mild now 7. RECURRENT SYMPTOM: "Have you ever had this type of stomach pain before?" If Yes, ask: "When was the last time?" and "What happened that time?"       8. CAUSE: "What do you think is causing the stomach pain?"     Bad chicken 9. RELIEVING/AGGRAVATING FACTORS: "What makes it better or  worse?" (e.g., antacids, bending or twisting motion, bowel movement)      10. OTHER SYMPTOMS: "Do you have any other symptoms?" (e.g., back pain, diarrhea, fever, urination pain, vomiting)       Diarrhea  Protocols used: Sore Throat-A-AH, Abdominal Pain - Female-A-AH

## 2022-10-04 ENCOUNTER — Ambulatory Visit: Payer: Medicaid Other | Admitting: Family Medicine

## 2022-10-04 ENCOUNTER — Encounter: Payer: Self-pay | Admitting: Family Medicine

## 2022-10-04 VITALS — BP 97/67 | HR 93 | Temp 98.9°F | Wt 274.5 lb

## 2022-10-04 DIAGNOSIS — J029 Acute pharyngitis, unspecified: Secondary | ICD-10-CM | POA: Diagnosis not present

## 2022-10-04 DIAGNOSIS — J020 Streptococcal pharyngitis: Secondary | ICD-10-CM | POA: Insufficient documentation

## 2022-10-04 LAB — RAPID STREP SCREEN (MED CTR MEBANE ONLY): Strep Gp A Ag, IA W/Reflex: POSITIVE — AB

## 2022-10-04 MED ORDER — AMOXICILLIN 500 MG PO CAPS: 500.0000 mg | ORAL_CAPSULE | Freq: Two times a day (BID) | ORAL | 0 refills | Status: AC

## 2022-10-04 NOTE — Patient Instructions (Addendum)
NSAID for pain relief Try Lozenge or throat spray  Frozen liquids (ice/ popsicles) Warm liquids (soup/tea) Humidifier use to prevent dryness

## 2022-10-04 NOTE — Assessment & Plan Note (Addendum)
Acute, stable. Strep test positive today. Amoxicillin  BID x10 days. Recommend NSAID, lozenge, throat spray, and warm/cold liquids for relief. F/u if symptoms worsen.

## 2022-10-04 NOTE — Progress Notes (Signed)
BP 97/67   Pulse 93   Temp 98.9 F (37.2 C) (Oral)   Wt 274 lb 8 oz (124.5 kg)   LMP 08/20/2015 (Approximate) Comment: denies preg  SpO2 97%   BMI 47.12 kg/m    Subjective:    Patient ID: Yesenia Stevens, female    DOB: 12-03-1965, 57 y.o.   MRN: [id number]  HPI: Yesenia Stevens is a 57 y.o. female  Chief Complaint  Patient presents with   Sore Throat    Pt states she has been having a sore throat for the last week. States she was exposed to her god children who were getting over strep at the time.    SORE THROAT Duration: 1 week Worst symptom: Sore throat Fever: no Cough: no Shortness of breath: no Wheezing: no Chest pain: no Chest tightness: no Chest congestion: no Nasal congestion: no Runny nose: no Post nasal drip: no Sneezing: no Sore throat: yes Swollen glands: no Sinus pressure: no Headache: no Face pain: no Toothache: no Ear pain: no  Ear pressure: no  Eyes red/itching:no Eye drainage/crusting: no  Vomiting: no Rash: no Fatigue: no Sick contacts: no Strep contacts: yes  Context: better Recurrent sinusitis: no Relief with OTC cold/cough medications: no  Treatments attempted:  Emergency gummies     Relevant past medical, surgical, family and social history reviewed and updated as indicated. Interim medical history since our last visit reviewed. Allergies and medications reviewed and updated.  Review of Systems  Constitutional:  Negative for chills, fatigue and fever.  HENT:  Positive for sore throat. Negative for congestion, ear pain, postnasal drip, rhinorrhea, sinus pressure, sinus pain and sneezing.   Eyes:  Negative for discharge, redness and itching.  Respiratory:  Negative for cough, chest tightness, shortness of breath and wheezing.   Cardiovascular:  Negative for chest pain.  Gastrointestinal:  Negative for vomiting.  Skin:  Negative for rash.  Neurological:  Negative for headaches.    Per HPI unless specifically indicated  above     Objective:    BP 97/67   Pulse 93   Temp 98.9 F (37.2 C) (Oral)   Wt 274 lb 8 oz (124.5 kg)   LMP 08/20/2015 (Approximate) Comment: denies preg  SpO2 97%   BMI 47.12 kg/m   Wt Readings from Last 3 Encounters:  10/04/22 274 lb 8 oz (124.5 kg)  09/08/22 285 lb 14.4 oz (129.7 kg)  08/09/22 283 lb 8 oz (128.6 kg)    Physical Exam Vitals and nursing note reviewed.  Constitutional:      General: She is awake. She is not in acute distress.    Appearance: Normal appearance. She is well-developed and well-groomed. She is not ill-appearing.  HENT:     Head: Normocephalic and atraumatic.     Right Ear: Hearing and external ear normal. No drainage.     Left Ear: Hearing and external ear normal. No drainage.     Nose: Nose normal.     Mouth/Throat:     Pharynx: Posterior oropharyngeal erythema present. No pharyngeal swelling, oropharyngeal exudate or uvula swelling.  Eyes:     General: Lids are normal.        Right eye: No discharge.        Left eye: No discharge.     Conjunctiva/sclera: Conjunctivae normal.  Cardiovascular:     Rate and Rhythm: Normal rate and regular rhythm.     Heart sounds: Normal heart sounds, S1 normal and S2 normal. No murmur heard.  No gallop.  Pulmonary:     Effort: Pulmonary effort is normal. No accessory muscle usage or respiratory distress.     Breath sounds: Normal breath sounds.  Musculoskeletal:        General: Normal range of motion.     Cervical back: Full passive range of motion without pain and normal range of motion.     Right lower leg: No edema.     Left lower leg: No edema.  Skin:    General: Skin is warm and dry.     Capillary Refill: Capillary refill takes less than 2 seconds.  Neurological:     Mental Status: She is alert and oriented to person, place, and time.  Psychiatric:        Attention and Perception: Attention normal.        Mood and Affect: Mood normal.        Speech: Speech normal.        Behavior: Behavior  normal. Behavior is cooperative.        Thought Content: Thought content normal.     Results for orders placed or performed in visit on 09/08/22  CBC with Differential/Platelet  Result Value Ref Range   WBC 8.7 3.4 - 10.8 x10E3/uL   RBC 4.39 3.77 - 5.28 x10E6/uL   Hemoglobin 11.9 11.1 - 15.9 g/dL   Hematocrit 09.8 11.9 - 46.6 %   MCV 82 79 - 97 fL   MCH 27.1 26.6 - 33.0 pg   MCHC 33.0 31.5 - 35.7 g/dL   RDW 14.7 82.9 - 56.2 %   Platelets 347 150 - 450 x10E3/uL   Neutrophils 53 Not Estab. %   Lymphs 37 Not Estab. %   Monocytes 7 Not Estab. %   Eos 3 Not Estab. %   Basos 0 Not Estab. %   Neutrophils Absolute 4.6 1.4 - 7.0 x10E3/uL   Lymphocytes Absolute 3.2 (H) 0.7 - 3.1 x10E3/uL   Monocytes Absolute 0.6 0.1 - 0.9 x10E3/uL   EOS (ABSOLUTE) 0.3 0.0 - 0.4 x10E3/uL   Basophils Absolute 0.0 0.0 - 0.2 x10E3/uL   Immature Granulocytes 0 Not Estab. %   Immature Grans (Abs) 0.0 0.0 - 0.1 x10E3/uL  Comprehensive metabolic panel  Result Value Ref Range   Glucose 117 (H) 70 - 99 mg/dL   BUN 12 6 - 24 mg/dL   Creatinine, Ser 1.30 0.57 - 1.00 mg/dL   eGFR 82 >86 VH/QIO/9.62   BUN/Creatinine Ratio 14 9 - 23   Sodium 141 134 - 144 mmol/L   Potassium 3.9 3.5 - 5.2 mmol/L   Chloride 103 96 - 106 mmol/L   CO2 22 20 - 29 mmol/L   Calcium 8.8 8.7 - 10.2 mg/dL   Total Protein 7.5 6.0 - 8.5 g/dL   Albumin 4.0 3.8 - 4.9 g/dL   Globulin, Total 3.5 1.5 - 4.5 g/dL   Albumin/Globulin Ratio 1.1 (L) 1.2 - 2.2   Bilirubin Total 0.4 0.0 - 1.2 mg/dL   Alkaline Phosphatase 80 44 - 121 IU/L   AST 15 0 - 40 IU/L   ALT 13 0 - 32 IU/L  Lipid panel  Result Value Ref Range   Cholesterol, Total 140 100 - 199 mg/dL   Triglycerides 85 0 - 149 mg/dL   HDL 46 >95 mg/dL   VLDL Cholesterol Cal 16 5 - 40 mg/dL   LDL Chol Calc (NIH) 78 0 - 99 mg/dL   Chol/HDL Ratio 3.0 0.0 - 4.4 ratio  TSH  Result  Value Ref Range   TSH 1.540 0.450 - 4.500 uIU/mL  Urinalysis, Routine w reflex microscopic  Result Value Ref  Range   Specific Gravity, UA 1.020 1.005 - 1.030   pH, UA 5.0 5.0 - 7.5   Color, UA Yellow Yellow   Appearance Ur Clear Clear   Leukocytes,UA Negative Negative   Protein,UA Negative Negative/Trace   Glucose, UA 1+ (A) Negative   Ketones, UA Negative Negative   RBC, UA Negative Negative   Bilirubin, UA Negative Negative   Urobilinogen, Ur 0.2 0.2 - 1.0 mg/dL   Nitrite, UA Negative Negative   Microscopic Examination Comment   Cytology - PAP  Result Value Ref Range   Adequacy      Satisfactory for evaluation; transformation zone component ABSENT.   Diagnosis      - Negative for intraepithelial lesion or malignancy (NILM)   Microorganisms Shift in flora suggestive of bacterial vaginosis       Assessment & Plan:   Problem List Items Addressed This Visit       Respiratory   Acute streptococcal pharyngitis - Primary    Acute, stable. Strep test positive today. Amoxicillin  BID x10 days. Recommend NSAID, lozenge, throat spray, and warm/cold liquids for relief. F/u if symptoms worsen.       Relevant Medications   amoxicillin (AMOXIL) 500 MG capsule   Other Relevant Orders   Rapid Strep screen([facility])     Follow up plan: Return in about 8 weeks (around 12/02/2022) for Follow up HTN, DM2, HLD.

## 2022-10-05 ENCOUNTER — Encounter: Payer: Self-pay | Admitting: Physical Therapy

## 2022-10-05 ENCOUNTER — Ambulatory Visit: Payer: BLUE CROSS/BLUE SHIELD | Admitting: Physical Therapy

## 2022-10-05 DIAGNOSIS — M25652 Stiffness of left hip, not elsewhere classified: Secondary | ICD-10-CM | POA: Diagnosis not present

## 2022-10-05 DIAGNOSIS — R29898 Other symptoms and signs involving the musculoskeletal system: Secondary | ICD-10-CM

## 2022-10-05 DIAGNOSIS — R2689 Other abnormalities of gait and mobility: Secondary | ICD-10-CM

## 2022-10-05 DIAGNOSIS — R269 Unspecified abnormalities of gait and mobility: Secondary | ICD-10-CM

## 2022-10-05 DIAGNOSIS — M6281 Muscle weakness (generalized): Secondary | ICD-10-CM

## 2022-10-05 DIAGNOSIS — M25552 Pain in left hip: Secondary | ICD-10-CM

## 2022-10-05 NOTE — Therapy (Signed)
OUTPATIENT PHYSICAL THERAPY THORACOLUMBAR TREATMENT Physical Therapy Progress Note  Dates of reporting period  07/05/22   to   10/05/22   Patient Name: Yesenia Stevens MRN: [id number] DOB:1965-07-10, 57 y.o., female Today's Date: 10/05/2022   PT End of Session - 10/05/22 1038     Visit Number 50    Number of Visits 54    Date for PT Re-Evaluation 10/12/22    PT Start Time 1029    Activity Tolerance Patient tolerated treatment well;Patient limited by pain    Behavior During Therapy [facility] for tasks assessed/performed            Past Medical History:  Diagnosis Date   Diabetes mellitus without complication    Hypertension    Past Surgical History:  Procedure Laterality Date   CESAREAN SECTION     CHOLECYSTECTOMY     COLONOSCOPY WITH PROPOFOL N/A 12/10/2019   Procedure: COLONOSCOPY WITH PROPOFOL;  Surgeon: Wyline Mood, MD;  Location: [facility] ENDOSCOPY;  Service: Gastroenterology;  Laterality: N/A;   COLONOSCOPY WITH PROPOFOL N/A 12/24/2019   Procedure: COLONOSCOPY WITH PROPOFOL;  Surgeon: Wyline Mood, MD;  Location: [facility] ENDOSCOPY;  Service: Gastroenterology;  Laterality: N/A;   FRACTURE SURGERY     Patient Active Problem List   Diagnosis Date Noted   Acute streptococcal pharyngitis 10/04/2022   Hyperlipidemia associated with type 2 diabetes mellitus 09/22/2021   Displaced fracture of base of neck of right femur, sequela 07/16/2021   Closed fracture of posterior wall of left acetabulum with routine healing 06/16/2021   Palpitations 03/19/2020   Diarrhea 03/19/2020   Premature atrial contractions 08/20/2018   Morbid (severe) obesity due to excess calories 08/01/2018   Shortness of breath 07/26/2018   Chronic cough 03/22/2018   Uncomplicated type 2 diabetes mellitus 03/22/2018   Hypertension associated with diabetes 03/22/2018    PCP: Larae Grooms, NP   REFERRING PROVIDER: Ernst Bowler, MD  REFERRING DIAG:  Diagnosis  S32.15XD (ICD-10-CM) - Type 2 fracture  of sacrum, subsequent encounter for fracture with routine healing  S32.422D (ICD-10-CM) - Displaced fracture of posterior wall of left acetabulum, subsequent encounter for fracture with routine healing    Rationale for Evaluation and Treatment Rehabilitation  THERAPY DIAG:  Stiffness of left hip joint  Gait difficulty  Pain in left hip  Balance problems  Muscle weakness (generalized)  Impaired strength of hip muscles  ONSET DATE: 05/28/21  SUBJECTIVE:                                                                                                                                                                                            PERTINENT  HISTORY:  Hx of L hip dislocation, and screws placed in lower back after her injury. Pt left hospital in a walker, and utilized a WC originally during [facility]. Pt has decreased ankle DF in L ankle, and pain in her lateral foot / 5th metatarsal. Pt had L knee scope, and has tenderness/bruising in L LE along tibia.  PAIN:  Are you having pain? NPRS scale: 4-5/10 Pain location: L hip (anterior) Pain description: popping and dull/achy Aggravating factors: standing, bending forward (especially left) Relieving factors: sitting down, tylenol PRN   PRECAUTIONS: Anterior hip, Posterior hip, Knee, and Fall  WEIGHT BEARING RESTRICTIONS no  FALLS:  Has patient fallen in last 6 months Yes. Number of falls 1  LIVING ENVIRONMENT: Lives with: lives with their family Lives in: House/apartment Stairs: yes - 2 to get into home with raining Has following equipment at home: Quad cane large base and Wheelchair (manual)  OCCUPATION:  Pt is currently on long term disability. Previously worked at [employer] prior to injury. Would like to get back to work if the company can find a position that is more stationary.  PLOF: Independent  PATIENT GOALS Pt wants to alleviate pain, become more mobile, strengthen back musculature, strengthen LEs  OBJECTIVE:    DIAGNOSTIC FINDINGS:  LUMBAR SPINE - 2-3 VIEW   COMPARISON:  MRI 09/28/2009   FINDINGS: 4-5 mm retrolisthesis L5-S1, slightly progressive since prior examination, possibly degenerative in nature. Otherwise normal lumbar lordosis. No acute fracture of the lumbar spine. Vertebral body height is preserved. Intervertebral disc space narrowing and endplate remodeling at L4-5 and L5-S1 is in keeping with mild degenerative disc disease at these levels. Paraspinal soft tissues are unremarkable. Right sacroiliac arthrodesis has been performed with 2 partially threaded screws.   IMPRESSION: No acute fracture or traumatic listhesis.   Degenerative changes L4-S1 with 5 mm retrolisthesis L5-S1.     Electronically Signed   By: Helyn Numbers M.D.   On: 09/17/2021 22:13  PATIENT SURVEYS:  FOTO 52 ; 61  SCREENING FOR RED FLAGS: Bowel or bladder incontinence: No Spinal tumors: No Cauda equina syndrome: No Compression fracture: No Abdominal aneurysm: No  COGNITION:  Overall cognitive status: Within functional limits for tasks assessed     SENSATION: Not tested - patient reports altered sensation in L LE/foot  POSTURE: rounded shoulders, forward head, and weight shift left  PALPATION: Moderate L medial lower leg tenderness with palpation  LUMBAR ROM:   Active  AROM  eval  Flexion 50% limited (compensation to R)  Extension 75% limited (pain)  Right lateral flexion   Left lateral flexion   Right rotation 50% limited  Left rotation 25% limited   (Blank rows = not tested)  LOWER EXTREMITY ROM:     PROM/AROM Right eval Left eval  Hip flexion WFL 119/ 90 deg.  Hip extension    Hip abduction [facility]  Hip adduction    Hip internal rotation    Hip external rotation    Knee flexion WFL 106/ 99 deg.  Knee extension    Ankle dorsiflexion WFL Limited by pain  Ankle plantarflexion WFL Limited by pain  Ankle inversion    Ankle eversion     (Blank rows = not  tested)  LOWER EXTREMITY MMT:    MMT Right eval Left eval  Hip flexion 4/5 3/5  Hip extension    Hip abduction 4/5 3/5  Hip adduction 4/5 4/5  Hip internal rotation 5/5 unable  Hip external rotation 5/5 3/5  Knee flexion 5/5 3/5 (pain)  Knee extension 5/5 4/5  Ankle dorsiflexion 4+/5 3/5  Ankle plantarflexion 5/5 4/5  Ankle inversion    Ankle eversion     (Blank rows = not tested)  FUNCTIONAL TESTS:  5 times sit to stand: 25.4 seconds .  9/21:  14.06 seconds (marked improvement).    GAIT: Distance walked: in clinic Assistive device utilized: Quad cane small base Level of assistance: Modified independence Comments: R sided Trendelenburg.  L antalgic gait pattern with decrease stance on L with short R LE step length/ shuffling.  No arm swing. Walks with wide base of support, with mild supination of B feet. Pt had limited endurance, and significantly limited L hip flexion and DF to clear her foot during swing phase.     01/25/22: 5xSTS: 15.94 sec./ 14.94 sec.  (Marked improvement).      9/21:  5xSTS: 14.06 seconds (marked improvement).      11/8: 5xSTS: 12.48 seconds  (improvement).      06/24/22: 9.89 seconds (marked improvement).    2/28: L/R LE strength testing: hip flexion 3-/4, quad 4+/4+, R hip abduction 4-/3+.  L knee flexion 119 deg.   Reassessment of LE muscle strength: R hip/knee strength grossly 5/5 MMT except hip abduction 4+/5.  L hip flexion 3+/5 MMT, hip abduction 4-/5, adduction 4/5, extension 4/5 MMT.    TREATMENT:  10/05/2022  Subjective:   Pt. Continues to c/o 3/10 L hip pain with walking/ standing tasks.  Pt. Went to gym last Saturday morning.  Pt. Was sick with food poisoning for past 3 days.  Pt. Going to [place], [place] this weekend and PT instructed to take walking breaks every couple hours during trip.    Treatment:      There.ex.:        Nustep B UE/LE for 10 min at L5.  Discussed gym based ex./ activity.     Forward/backwards/lateral walking  in //-bars 5 laps each.  Min. UE assist and progressing to no UE assist.  Mirror feedback/ cuing to increase L hip/foot in midline.     STS from gray chair 10x with no UE assist.  Equal wt. Bearing on L/R with good upright posture.       Tandem stance (improved stance time)/ tandem walking in //-bars (difficulty)- requires light UE assist for safety.   No ankle wts.: LAQ/ seated marching 20x.  High marching in //-bars with cuing to correct posture/ mirror feedback/ hip extension/ hamstring curls/ heel and toe raises 20x.  5 laps.      Discussed HEP/ gym based ex. Program. PT encourages pool exercise at local [employer] to decrease force on L hip while encouraging movement.   PATIENT EDUCATION:  Education details: Access Code: [phone] Person educated: Patient Education method: Explanation, Demonstration, and Handouts Education comprehension: verbalized understanding and returned demonstration   HOME EXERCISE PROGRAM: Access Code: [email or web address] URL: [email or web address] Date: 11/25/2021 Prepared by: Dorene Grebe  Exercises - Straight Leg Raise  - 1 x daily - 7 x weekly - 2 sets - 10 reps - Supine Heel Slide  - 1 x daily - 7 x weekly - 2 sets - 10 reps - Supine Hip Adduction Isometric with Ball  - 1 x daily - 7 x weekly - 2 sets - 10 reps - Hooklying Gluteal Sets  - 1 x daily - 7 x weekly - 2 sets - 10 reps - Seated Heel Raise  - 1 x daily - 7 x weekly - 2 sets -  10 reps - Seated Heel Toe Raises  - 1 x daily - 7 x weekly - 2 sets - 10 reps  Access Code: [email or web address] URL: [email or web address] Date: 06/27/2022 Prepared by: Dorene Grebe  Exercises - Straight Leg Raise  - 1 x daily - 7 x weekly - 2 sets - 10 reps - Supine Hip Adduction Isometric with Ball  - 1 x daily - 7 x weekly - 2 sets - 10 reps - Hooklying Gluteal Sets  - 1 x daily - 7 x weekly - 2 sets - 10 reps - Standing Hip Abduction  - 1 x daily - 7 x weekly - 2 sets - 12 reps - Mini Squat with Counter  Support  - 1 x daily - 7 x weekly - 2 sets - 12 reps  ASSESSMENT:  CLINICAL IMPRESSION:  Pt presented today with SPC with L antalgic gait.  Pt. Remains limited with moderate L hip pain with increase activity/ walking/ getting into car.  Pt. Continues to have difficulty lifting L LE into driver's side seat without UE assist.  Pt's pain fluctuates during tx. and she takes meds for relief. Pt provided with rest breaks as needed to control pain. Pt demonstrates slow but steady progress and continues to demonstrate proximal weakness > distal, high fall risk and fair endurance.  Pt will benefit from continue PT interventions to address pt impairments in order to achieve pt's goal.   OBJECTIVE IMPAIRMENTS Abnormal gait, decreased activity tolerance, decreased balance, decreased endurance, decreased mobility, difficulty walking, decreased ROM, decreased strength, hypomobility, impaired flexibility, impaired sensation, improper body mechanics, postural dysfunction, obesity, and pain.   ACTIVITY LIMITATIONS carrying, lifting, bending, sitting, standing, squatting, sleeping, stairs, transfers, bed mobility, dressing, and locomotion level  PARTICIPATION LIMITATIONS: cleaning, laundry, driving, shopping, community activity, occupation, and yard work  PERSONAL FACTORS Age, Education, and Past/current experiences are also affecting patient's functional outcome.   REHAB POTENTIAL: Good  CLINICAL DECISION MAKING: Evolving/moderate complexity  EVALUATION COMPLEXITY: Moderate   GOALS: Goals reviewed with patient Yes  SHORT TERM GOALS: Target date: 07/22/22  Pt will ambulate through the clinic with a symmetrical gait pattern, with equal step length and decreased pain for > 100 ft.  Baseline: Asymmetrical gait - increased L step length and decreased R step length Goal status: MET   LONG TERM GOALS: Target date: 10/12/22  Pt will increase FOTO score to > 58 Baseline: 9/7: 52 (no significant changes).   11/8: 49 (slight regression secondary to increase L anterior hip pain with stair climbing/ increase distance walked.  1/16: 46 (continued regression/ pt. Considering THA) Goal status: Not met  2.  Pt will decrease pain level to < 2/10 during all ADLs, in order to efficiently accomplish activities with decreased pain and increased independence Baseline: see above Goal status: Partially met  3.  Pt will increase Active L knee flexion ROM to > 120 degrees. Baseline: 99, 106 passive.  8/8: 118 deg.  10/25: 118 deg. 2/28: 119 deg.  Goal status: Partially met  4.  Pt. Will increase B LE muscle strength 1/2 muscle grade to improve stepping up into drivers side of car.    Baseline: see above  Goal status: Initial  PLAN: PT FREQUENCY: 1x/week  PT DURATION: 8 weeks  PLANNED INTERVENTIONS: Therapeutic exercises, Therapeutic activity, Neuromuscular re-education, Balance training, Gait training, Patient/Family education, Joint mobilization, Stair training, Aquatic Therapy, Cryotherapy, Moist heat, and Manual therapy.  PLAN FOR NEXT SESSION:  More standing dynamic ex., functional activity conditioning (walking, steps).  CHECK GOALS/ Discuss POC   Cammie Mcgee, PT, DPT # [phone] 10:39 AM,10/05/22  [facility] [address] [place], [place], [address] Phone: [phone]   Fax:  [phone]

## 2022-10-12 ENCOUNTER — Ambulatory Visit: Payer: Self-pay | Admitting: *Deleted

## 2022-10-12 ENCOUNTER — Ambulatory Visit: Payer: BLUE CROSS/BLUE SHIELD | Admitting: Physical Therapy

## 2022-10-12 NOTE — Telephone Encounter (Signed)
Summary: diarrhea   Patient has been taking amoxicillin (AMOXIL) 500 MG capsule since 4/16 and she is having an annoying pain in her stomach and diarrhea. She has been taking 1 in the morning and 1 at night. She wants to know if this is what should be happening or if she needs to take only 1 a day. Please advise patient       Chief Complaint: diarrhea after taking amoxil. Should continue medication or stop? Symptoms: started amoxil 500 mg 10/04/22 2 times daily. Sx diarrhea / abdominal discomfort dull constant ache, annoying 10/10/22. Lots of gas and burping. Vomited x 1 diarrhea loose and watery. Reports also taking biotin for hair growth.  Frequency: 10/10/22 Pertinent Negatives: Patient denies fever Disposition: ED /[] Urgent Care (no appt availability in office) / Appointment(In office/virtual)/  [facility]/ Home Care/ Refused Recommended Disposition /[] [facility]/  Follow-up with PCP Additional Notes:   Recommended eating probiotic yogurt while taking antibiotics. Please advise if appt needed. Please advise if patient should stop amoxil or change to another antibiotic. Patient would like a call back.         Reason for Disposition  Patient wants to stop the antibiotic  Answer Assessment - Initial Assessment Questions 1. ANTIBIOTIC: "What antibiotic are you taking?" "How many times per day?"     2 times day 2. ANTIBIOTIC ONSET: "When was the antibiotic started?"     10/04/22 3. DIARRHEA SEVERITY: "How bad is the diarrhea?" "How many more stools have you had in the past 24 hours than normal?"    - NO DIARRHEA (SCALE 0)   - MILD (SCALE 1-3): Few loose or mushy BMs; increase of 1-3 stools over normal daily number of stools; mild increase in ostomy output.   -  MODERATE (SCALE 4-7): Increase of 4-6 stools daily over normal; moderate increase in ostomy output. * SEVERE (SCALE 8-10; OR 'WORST POSSIBLE'): Increase of 7 or more stools daily over normal;  moderate increase in ostomy output; incontinence.     "A lot" 4. ONSET: "When did the diarrhea begin?"      10/10/22 5. BM CONSISTENCY: "How loose or watery is the diarrhea?"      Loose and watery  6. VOMITING: "Are you also vomiting?" If Yes, ask: "How many times in the past 24 hours?"      X 1  7. ABDOMEN PAIN: "Are you having any abdomen pain?" If Yes, ask: "What does it feel like?" (e.g., crampy, dull, intermittent, constant)      Yes annoying pain , dull constant 8. ABDOMEN PAIN SEVERITY: If present, ask: "How bad is the pain?"  (e.g., Scale 1-10; mild, moderate, or severe)   - MILD (1-3): doesn't interfere with normal activities, abdomen soft and not tender to touch    - MODERATE (4-7): interferes with normal activities or awakens from sleep, abdomen tender to touch    - SEVERE (8-10): excruciating pain, doubled over, unable to do any normal activities       Constant dull  annoying ache 9. ORAL INTAKE: If vomiting, "Have you been able to drink liquids?" "How much liquids have you had in the past 24 hours?"     Able to eat and drink  10. HYDRATION: "Any signs of dehydration?" (e.g., dry mouth [not just dry lips], too weak to stand, dizziness, new weight loss) "When did you last urinate?"       No  11. EXPOSURE: "Have you traveled to a foreign country recently?" "  Have you been exposed to anyone with diarrhea?" "Could you have eaten any food that was spoiled?"       na 12. OTHER SYMPTOMS: "Do you have any other symptoms?" (e.g., fever, blood in stool)       Abdominal discomfort, diarrhea 13. PREGNANCY: "Is there any chance you are pregnant?" "When was your last menstrual period?"       na  Protocols used: Diarrhea on Antibiotics-A-AH

## 2022-10-18 ENCOUNTER — Ambulatory Visit: Payer: BLUE CROSS/BLUE SHIELD | Admitting: Physical Therapy

## 2022-10-18 ENCOUNTER — Encounter: Payer: Self-pay | Admitting: Physical Therapy

## 2022-10-18 DIAGNOSIS — R29898 Other symptoms and signs involving the musculoskeletal system: Secondary | ICD-10-CM

## 2022-10-18 DIAGNOSIS — R269 Unspecified abnormalities of gait and mobility: Secondary | ICD-10-CM

## 2022-10-18 DIAGNOSIS — M25652 Stiffness of left hip, not elsewhere classified: Secondary | ICD-10-CM | POA: Diagnosis not present

## 2022-10-18 DIAGNOSIS — M25552 Pain in left hip: Secondary | ICD-10-CM

## 2022-10-18 DIAGNOSIS — R2689 Other abnormalities of gait and mobility: Secondary | ICD-10-CM

## 2022-10-18 DIAGNOSIS — M6281 Muscle weakness (generalized): Secondary | ICD-10-CM

## 2022-10-18 NOTE — Therapy (Signed)
OUTPATIENT PHYSICAL THERAPY THORACOLUMBAR TREATMENT/RECERTIFICATION  Patient Name: Yesenia Stevens MRN: [id number] DOB:1965/08/17, 57 y.o., female Today's Date: 10/18/2022   PT End of Session - 10/18/22 1534     Visit Number 51    Number of Visits 59    Date for PT Re-Evaluation 12/13/22    PT Start Time 1519    PT Stop Time 1602    PT Time Calculation (min) 43 min    Activity Tolerance Patient tolerated treatment well;Patient limited by pain    Behavior During Therapy [facility] for tasks assessed/performed            Past Medical History:  Diagnosis Date   Diabetes mellitus without complication (HCC)    Hypertension    Past Surgical History:  Procedure Laterality Date   CESAREAN SECTION     CHOLECYSTECTOMY     COLONOSCOPY WITH PROPOFOL N/A 12/10/2019   Procedure: COLONOSCOPY WITH PROPOFOL;  Surgeon: Wyline Mood, MD;  Location: [facility] ENDOSCOPY;  Service: Gastroenterology;  Laterality: N/A;   COLONOSCOPY WITH PROPOFOL N/A 12/24/2019   Procedure: COLONOSCOPY WITH PROPOFOL;  Surgeon: Wyline Mood, MD;  Location: [facility] ENDOSCOPY;  Service: Gastroenterology;  Laterality: N/A;   FRACTURE SURGERY     Patient Active Problem List   Diagnosis Date Noted   Acute streptococcal pharyngitis 10/04/2022   Hyperlipidemia associated with type 2 diabetes mellitus (HCC) 09/22/2021   Displaced fracture of base of neck of right femur, sequela 07/16/2021   Closed fracture of posterior wall of left acetabulum with routine healing 06/16/2021   Palpitations 03/19/2020   Diarrhea 03/19/2020   Premature atrial contractions 08/20/2018   Morbid (severe) obesity due to excess calories (HCC) 08/01/2018   Shortness of breath 07/26/2018   Chronic cough 03/22/2018   Uncomplicated type 2 diabetes mellitus (HCC) 03/22/2018   Hypertension associated with diabetes (HCC) 03/22/2018    PCP: Larae Grooms, NP   REFERRING PROVIDER: Ernst Bowler, MD  REFERRING DIAG:  Diagnosis  S32.15XD (ICD-10-CM)  - Type 2 fracture of sacrum, subsequent encounter for fracture with routine healing  S32.422D (ICD-10-CM) - Displaced fracture of posterior wall of left acetabulum, subsequent encounter for fracture with routine healing    Rationale for Evaluation and Treatment Rehabilitation  THERAPY DIAG:  Stiffness of left hip joint  Gait difficulty  Pain in left hip  Balance problems  Muscle weakness (generalized)  Impaired strength of hip muscles  ONSET DATE: 05/28/21  SUBJECTIVE:  PERTINENT HISTORY:  Hx of L hip dislocation, and screws placed in lower back after her injury. Pt left hospital in a walker, and utilized a WC originally during [facility]. Pt has decreased ankle DF in L ankle, and pain in her lateral foot / 5th metatarsal. Pt had L knee scope, and has tenderness/bruising in L LE along tibia.  PAIN:  Are you having pain? NPRS scale: 4-5/10 Pain location: L hip (anterior) Pain description: popping and dull/achy Aggravating factors: standing, bending forward (especially left) Relieving factors: sitting down, tylenol PRN   PRECAUTIONS: Anterior hip, Posterior hip, Knee, and Fall  WEIGHT BEARING RESTRICTIONS no  FALLS:  Has patient fallen in last 6 months Yes. Number of falls 1  LIVING ENVIRONMENT: Lives with: lives with their family Lives in: House/apartment Stairs: yes - 2 to get into home with raining Has following equipment at home: Quad cane large base and Wheelchair (manual)  OCCUPATION:  Pt is currently on long term disability. Previously worked at [employer] prior to injury. Would like to get back to work if the company can find a position that is more stationary.  PLOF: Independent  PATIENT GOALS Pt wants to alleviate pain, become more mobile, strengthen back musculature, strengthen  LEs  OBJECTIVE:   DIAGNOSTIC FINDINGS:  LUMBAR SPINE - 2-3 VIEW   COMPARISON:  MRI 09/28/2009   FINDINGS: 4-5 mm retrolisthesis L5-S1, slightly progressive since prior examination, possibly degenerative in nature. Otherwise normal lumbar lordosis. No acute fracture of the lumbar spine. Vertebral body height is preserved. Intervertebral disc space narrowing and endplate remodeling at L4-5 and L5-S1 is in keeping with mild degenerative disc disease at these levels. Paraspinal soft tissues are unremarkable. Right sacroiliac arthrodesis has been performed with 2 partially threaded screws.   IMPRESSION: No acute fracture or traumatic listhesis.   Degenerative changes L4-S1 with 5 mm retrolisthesis L5-S1.     Electronically Signed   By: Helyn Numbers M.D.   On: 09/17/2021 22:13  PATIENT SURVEYS:  FOTO 52 ; 61  SCREENING FOR RED FLAGS: Bowel or bladder incontinence: No Spinal tumors: No Cauda equina syndrome: No Compression fracture: No Abdominal aneurysm: No  COGNITION:  Overall cognitive status: Within functional limits for tasks assessed     SENSATION: Not tested - patient reports altered sensation in L LE/foot  POSTURE: rounded shoulders, forward head, and weight shift left  PALPATION: Moderate L medial lower leg tenderness with palpation  LUMBAR ROM:   Active  AROM  eval  Flexion 50% limited (compensation to R)  Extension 75% limited (pain)  Right lateral flexion   Left lateral flexion   Right rotation 50% limited  Left rotation 25% limited   (Blank rows = not tested)  LOWER EXTREMITY ROM:     PROM/AROM Right eval Left eval  Hip flexion WFL 119/ 90 deg.  Hip extension    Hip abduction [facility]  Hip adduction    Hip internal rotation    Hip external rotation    Knee flexion WFL 106/ 99 deg.  Knee extension    Ankle dorsiflexion WFL Limited by pain  Ankle plantarflexion WFL Limited by pain  Ankle inversion    Ankle eversion     (Blank rows =  not tested)  LOWER EXTREMITY MMT:    MMT Right eval Left eval  Hip flexion 4/5 3/5  Hip extension    Hip abduction 4/5 3/5  Hip adduction 4/5 4/5  Hip internal rotation 5/5 unable  Hip external rotation 5/5 3/5  Knee flexion 5/5 3/5 (pain)  Knee extension 5/5 4/5  Ankle dorsiflexion 4+/5 3/5  Ankle plantarflexion 5/5 4/5  Ankle inversion    Ankle eversion     (Blank rows = not tested)  FUNCTIONAL TESTS:  5 times sit to stand: 25.4 seconds .  9/21:  14.06 seconds (marked improvement).    GAIT: Distance walked: in clinic Assistive device utilized: Quad cane small base Level of assistance: Modified independence Comments: R sided Trendelenburg.  L antalgic gait pattern with decrease stance on L with short R LE step length/ shuffling.  No arm swing. Walks with wide base of support, with mild supination of B feet. Pt had limited endurance, and significantly limited L hip flexion and DF to clear her foot during swing phase.     01/25/22: 5xSTS: 15.94 sec./ 14.94 sec.  (Marked improvement).      9/21:  5xSTS: 14.06 seconds (marked improvement).      11/8: 5xSTS: 12.48 seconds  (improvement).      06/24/22: 9.89 seconds (marked improvement).    2/28: L/R LE strength testing: hip flexion 3-/4, quad 4+/4+, R hip abduction 4-/3+.  L knee flexion 119 deg.   Reassessment of LE muscle strength: R hip/knee strength grossly 5/5 MMT except hip abduction 4+/5.  L hip flexion 3+/5 MMT, hip abduction 4-/5, adduction 4/5, extension 4/5 MMT.    TREATMENT:  10/18/2022  Subjective:   Pt. Cancelled last PT tx. Session due to illness.  Pt. Reports persistent L hip pain with standing/ walking.  Pt. Continues to c/o 3-4/10 L hip pain with activity.     Treatment:      There.ex.:        Nustep B UE/LE for 10 min at L4.  Discussed L hip/groin pain and THA.      Forward/backwards/lateral walking in //-bars 5 laps each.  Min. UE assist and progressing to no UE assist.  Mirror feedback/ cuing to  increase L hip/foot in midline.       Walking in hallway with use of SPC and 2-point gait.  Moderate L antalgic gait and increase hip/overall pain today.     Seated LAQ/ marching 20x.  Pt. Significantly limited with seated L hip flexion.  PT assists pt. With L hip flexion and cued pt. To hold (5x)- difficulty.  Seated hip abduction/ adduction with manual resistance from PT 10x (moderate resistance).      Discussed HEP/ gym based ex. Program. PT encourages pool exercise at local [employer] to decrease force on L hip while encouraging movement.   PATIENT EDUCATION:  Education details: Access Code: [phone] Person educated: Patient Education method: Explanation, Demonstration, and Handouts Education comprehension: verbalized understanding and returned demonstration   HOME EXERCISE PROGRAM: Access Code: [email or web address] URL: [email or web address] Date: 11/25/2021 Prepared by: Dorene Grebe  Exercises - Straight Leg Raise  - 1 x daily - 7 x weekly - 2 sets - 10 reps - Supine Heel Slide  - 1 x daily - 7 x weekly - 2 sets - 10 reps - Supine Hip Adduction Isometric with Ball  - 1 x daily - 7 x weekly - 2 sets - 10 reps - Hooklying Gluteal Sets  - 1 x daily - 7 x weekly - 2 sets - 10 reps - Seated Heel Raise  - 1 x daily - 7 x weekly - 2 sets - 10 reps - Seated Heel Toe Raises  - 1 x daily - 7 x weekly - 2 sets - 10 reps  Access  Code: [email or web address] URL: [email or web address] Date: 06/27/2022 Prepared by: Dorene Grebe  Exercises - Straight Leg Raise  - 1 x daily - 7 x weekly - 2 sets - 10 reps - Supine Hip Adduction Isometric with Ball  - 1 x daily - 7 x weekly - 2 sets - 10 reps - Hooklying Gluteal Sets  - 1 x daily - 7 x weekly - 2 sets - 10 reps - Standing Hip Abduction  - 1 x daily - 7 x weekly - 2 sets - 12 reps - Mini Squat with Counter Support  - 1 x daily - 7 x weekly - 2 sets - 12 reps  ASSESSMENT:  CLINICAL IMPRESSION:  Pt. Remains limited with moderate L hip  pain with increase activity/ walking/ getting into car.  Pt. Continues to have difficulty lifting L LE into driver's side seat without UE assist.  Pt's pain fluctuates during tx. and she takes meds for relief.  Pt. Benefits from use of ibuprofen during the day.  Pt provided with rest breaks as needed to control pain. Pt demonstrates slow but steady progress and continues to demonstrate proximal weakness > distal, high fall risk and fair endurance.  PT encouraged pt. To increase gym/ pool ex. Frequency during the week.  PT discussed pros/cons of THA.  See updated goals.  Pt will benefit from continue PT interventions to address pt impairments in order to achieve pt's goal.   OBJECTIVE IMPAIRMENTS Abnormal gait, decreased activity tolerance, decreased balance, decreased endurance, decreased mobility, difficulty walking, decreased ROM, decreased strength, hypomobility, impaired flexibility, impaired sensation, improper body mechanics, postural dysfunction, obesity, and pain.   ACTIVITY LIMITATIONS carrying, lifting, bending, sitting, standing, squatting, sleeping, stairs, transfers, bed mobility, dressing, and locomotion level  PARTICIPATION LIMITATIONS: cleaning, laundry, driving, shopping, community activity, occupation, and yard work  PERSONAL FACTORS Age, Education, and Past/current experiences are also affecting patient's functional outcome.   REHAB POTENTIAL: Good  CLINICAL DECISION MAKING: Evolving/moderate complexity  EVALUATION COMPLEXITY: Moderate   GOALS: Goals reviewed with patient Yes  LONG TERM GOALS: Target date: 12/13/22  Pt will increase FOTO score to > 58 Baseline: 9/7: 52 (no significant changes).  11/8: 49 (slight regression secondary to increase L anterior hip pain with stair climbing/ increase distance walked.  1/16: 46 (continued regression/ pt. Considering THA).  4/30: 52 Goal status: Not met  2.  Pt will decrease pain level to < 2/10 during all ADLs, in order to  efficiently accomplish activities with decreased pain and increased independence Baseline: see above Goal status: Partially met  3.  Pt will increase Active L knee flexion ROM to > 120 degrees. Baseline: 99, 106 passive.  8/8: 118 deg.  10/25: 118 deg. 2/28: 119 deg.  Goal status: Partially met  4.  Pt. Will increase B LE muscle strength 1/2 muscle grade to improve stepping up into drivers side of car.    Baseline: see above  Goal status: Not met  PLAN: PT FREQUENCY: 1x/week  PT DURATION: 8 weeks  PLANNED INTERVENTIONS: Therapeutic exercises, Therapeutic activity, Neuromuscular re-education, Balance training, Gait training, Patient/Family education, Joint mobilization, Stair training, Aquatic Therapy, Cryotherapy, Moist heat, and Manual therapy.  PLAN FOR NEXT SESSION:  More standing dynamic ex., functional activity conditioning (walking, steps).  Check authorization?   Cammie Mcgee, PT, DPT # [phone] 8:31 PM,10/18/22  [facility] [address] [place], [place], [address] Phone: [phone]   Fax:  [phone]

## 2022-10-24 ENCOUNTER — Other Ambulatory Visit: Payer: Self-pay | Admitting: Nurse Practitioner

## 2022-10-25 NOTE — Telephone Encounter (Signed)
Requested Prescriptions  Pending Prescriptions Disp Refills   JARDIANCE 10 MG TABS tablet [Pharmacy Med Name: Jardiance 10 MG Oral Tablet] 90 tablet 0    Sig: TAKE 1 TABLET BY MOUTH ONCE DAILY BEFORE BREAKFAST     Endocrinology:  Diabetes - SGLT2 Inhibitors Passed - 10/24/2022  2:16 PM      Passed - Cr in normal range and within 360 days    Creatinine, Ser  Date Value Ref Range Status  09/08/2022 0.84 0.57 - 1.00 mg/dL Final         Passed - HBA1C is between 0 and 7.9 and within 180 days    HB A1C (BAYER DCA - WAIVED)  Date Value Ref Range Status  06/25/2020 8.7 (H) <7.0 % Final    Comment:                                          Diabetic Adult            <7.0                                       Healthy Adult        4.3 - 5.7                                                           (DCCT/NGSP) American Diabetes Association's Summary of Glycemic Recommendations for Adults with Diabetes: Hemoglobin A1c <7.0%. More stringent glycemic goals (A1c <6.0%) may further reduce complications at the cost of increased risk of hypoglycemia.    Hgb A1c MFr Bld  Date Value Ref Range Status  08/09/2022 6.0 (H) 4.8 - 5.6 % Final    Comment:             Prediabetes: 5.7 - 6.4          Diabetes: >6.4          Glycemic control for adults with diabetes: <7.0          Passed - eGFR in normal range and within 360 days    GFR calc Af Amer  Date Value Ref Range Status  06/25/2020 113 >59 mL/min/1.73 Final    Comment:    **In accordance with recommendations from the NKF-ASN Task force,**   [facility] is in the process of updating its eGFR calculation to the   2021 CKD-EPI creatinine equation that estimates kidney function   without a race variable.    GFR calc non Af Amer  Date Value Ref Range Status  06/25/2020 98 >59 mL/min/1.73 Final   eGFR  Date Value Ref Range Status  09/08/2022 82 >59 mL/min/1.73 Final         Passed - Valid encounter within last 6 months    Recent Outpatient  Visits           3 weeks ago Acute streptococcal pharyngitis   [place] [facility] [place], Sherran Needs, NP   1 month ago Annual physical exam   [place] [facility] Larae Grooms, NP   2 months ago Hyperlipidemia associated with type 2 diabetes mellitus (HCC)  [place] [facility] Larae Grooms, NP   2 months ago Forearm sprain, right, initial encounter   [place] [facility] [place], Oswaldo Conroy, PA-C   3 months ago Abrasion of back, unspecified laterality, initial encounter   [place] [facility] Mecum, Oswaldo Conroy, PA-C       Future Appointments             In 1 month Larae Grooms, NP [place] [facility]

## 2022-10-28 ENCOUNTER — Telehealth: Payer: Self-pay | Admitting: Nurse Practitioner

## 2022-10-28 NOTE — Telephone Encounter (Signed)
PA for Jardiance initiated and submitted urgently for Jardiance. Key: [id number]  Called and notified patient that PA has been submitted.

## 2022-10-28 NOTE — Telephone Encounter (Unsigned)
Copied from CRM [id number]. Topic: General - Other >> Oct 28, 2022 12:48 PM Ja-Kwan M wrote: Reason for CRM: Pt reports that she was informed that a prior authorization is needed for JARDIANCE 10 MG TABS tablet. Pt stated she is completely out of the medication and need this done asap

## 2022-10-31 NOTE — Telephone Encounter (Signed)
PA for medication approved.   Called and notified patient of approval.

## 2022-11-02 ENCOUNTER — Encounter: Payer: Self-pay | Admitting: Physical Therapy

## 2022-11-02 ENCOUNTER — Ambulatory Visit: Payer: BLUE CROSS/BLUE SHIELD | Attending: Trauma Surgery | Admitting: Physical Therapy

## 2022-11-02 DIAGNOSIS — M25552 Pain in left hip: Secondary | ICD-10-CM | POA: Insufficient documentation

## 2022-11-02 DIAGNOSIS — M25652 Stiffness of left hip, not elsewhere classified: Secondary | ICD-10-CM | POA: Insufficient documentation

## 2022-11-02 DIAGNOSIS — R269 Unspecified abnormalities of gait and mobility: Secondary | ICD-10-CM | POA: Diagnosis present

## 2022-11-02 DIAGNOSIS — M6281 Muscle weakness (generalized): Secondary | ICD-10-CM | POA: Diagnosis present

## 2022-11-02 DIAGNOSIS — R2689 Other abnormalities of gait and mobility: Secondary | ICD-10-CM | POA: Insufficient documentation

## 2022-11-02 DIAGNOSIS — R29898 Other symptoms and signs involving the musculoskeletal system: Secondary | ICD-10-CM | POA: Insufficient documentation

## 2022-11-02 NOTE — Therapy (Signed)
OUTPATIENT PHYSICAL THERAPY THORACOLUMBAR TREATMENT  Patient Name: Yesenia Stevens MRN: [id number] DOB:June 20, 1966, 57 y.o., female Today's Date: 11/02/2022   PT End of Session - 11/02/22 1432     Visit Number 52    Number of Visits 59    Date for PT Re-Evaluation 12/13/22    PT Start Time 1425    PT Stop Time 1514    PT Time Calculation (min) 49 min    Activity Tolerance Patient tolerated treatment well;Patient limited by pain    Behavior During Therapy [facility] for tasks assessed/performed            Past Medical History:  Diagnosis Date   Diabetes mellitus without complication (HCC)    Hypertension    Past Surgical History:  Procedure Laterality Date   CESAREAN SECTION     CHOLECYSTECTOMY     COLONOSCOPY WITH PROPOFOL N/A 12/10/2019   Procedure: COLONOSCOPY WITH PROPOFOL;  Surgeon: Wyline Mood, MD;  Location: [facility] ENDOSCOPY;  Service: Gastroenterology;  Laterality: N/A;   COLONOSCOPY WITH PROPOFOL N/A 12/24/2019   Procedure: COLONOSCOPY WITH PROPOFOL;  Surgeon: Wyline Mood, MD;  Location[facility] ENDOSCOPY;  Service: Gastroenterology;  Laterality: N/A;   FRACTURE SURGERY     Patient Active Problem List   Diagnosis Date Noted   Acute streptococcal pharyngitis 10/04/2022   Hyperlipidemia associated with type 2 diabetes mellitus (HCC) 09/22/2021   Displaced fracture of base of neck of right femur, sequela 07/16/2021   Closed fracture of posterior wall of left acetabulum with routine healing 06/16/2021   Palpitations 03/19/2020   Diarrhea 03/19/2020   Premature atrial contractions 08/20/2018   Morbid (severe) obesity due to excess calories (HCC) 08/01/2018   Shortness of breath 07/26/2018   Chronic cough 03/22/2018   Uncomplicated type 2 diabetes mellitus (HCC) 03/22/2018   Hypertension associated with diabetes (HCC) 03/22/2018    PCP: Larae Grooms, NP   REFERRING PROVIDER: Ernst Bowler, MD  REFERRING DIAG:  Diagnosis  S32.15XD (ICD-10-CM) - Type 2  fracture of sacrum, subsequent encounter for fracture with routine healing  S32.422D (ICD-10-CM) - Displaced fracture of posterior wall of left acetabulum, subsequent encounter for fracture with routine healing    Rationale for Evaluation and Treatment Rehabilitation  THERAPY DIAG:  Stiffness of left hip joint  Gait difficulty  Pain in left hip  Balance problems  Muscle weakness (generalized)  Impaired strength of hip muscles  ONSET DATE: 05/28/21  SUBJECTIVE:  PERTINENT HISTORY:  Hx of L hip dislocation, and screws placed in lower back after her injury. Pt left hospital in a walker, and utilized a WC originally during [facility]. Pt has decreased ankle DF in L ankle, and pain in her lateral foot / 5th metatarsal. Pt had L knee scope, and has tenderness/bruising in L LE along tibia.  PAIN:  Are you having pain? NPRS scale: 4-5/10 Pain location: L hip (anterior) Pain description: popping and dull/achy Aggravating factors: standing, bending forward (especially left) Relieving factors: sitting down, tylenol PRN   PRECAUTIONS: Anterior hip, Posterior hip, Knee, and Fall  WEIGHT BEARING RESTRICTIONS no  FALLS:  Has patient fallen in last 6 months Yes. Number of falls 1  LIVING ENVIRONMENT: Lives with: lives with their family Lives in: House/apartment Stairs: yes - 2 to get into home with raining Has following equipment at home: Quad cane large base and Wheelchair (manual)  OCCUPATION:  Pt is currently on long term disability. Previously worked at [employer] prior to injury. Would like to get back to work if the company can find a position that is more stationary.  PLOF: Independent  PATIENT GOALS Pt wants to alleviate pain, become more mobile, strengthen back musculature, strengthen  LEs  OBJECTIVE:   DIAGNOSTIC FINDINGS:  LUMBAR SPINE - 2-3 VIEW   COMPARISON:  MRI 09/28/2009   FINDINGS: 4-5 mm retrolisthesis L5-S1, slightly progressive since prior examination, possibly degenerative in nature. Otherwise normal lumbar lordosis. No acute fracture of the lumbar spine. Vertebral body height is preserved. Intervertebral disc space narrowing and endplate remodeling at L4-5 and L5-S1 is in keeping with mild degenerative disc disease at these levels. Paraspinal soft tissues are unremarkable. Right sacroiliac arthrodesis has been performed with 2 partially threaded screws.   IMPRESSION: No acute fracture or traumatic listhesis.   Degenerative changes L4-S1 with 5 mm retrolisthesis L5-S1.     Electronically Signed   By: Helyn Numbers M.D.   On: 09/17/2021 22:13  PATIENT SURVEYS:  FOTO 52 ; 61  SCREENING FOR RED FLAGS: Bowel or bladder incontinence: No Spinal tumors: No Cauda equina syndrome: No Compression fracture: No Abdominal aneurysm: No  COGNITION:  Overall cognitive status: Within functional limits for tasks assessed     SENSATION: Not tested - patient reports altered sensation in L LE/foot  POSTURE: rounded shoulders, forward head, and weight shift left  PALPATION: Moderate L medial lower leg tenderness with palpation  LUMBAR ROM:   Active  AROM  eval  Flexion 50% limited (compensation to R)  Extension 75% limited (pain)  Right lateral flexion   Left lateral flexion   Right rotation 50% limited  Left rotation 25% limited   (Blank rows = not tested)  LOWER EXTREMITY ROM:     PROM/AROM Right eval Left eval  Hip flexion WFL 119/ 90 deg.  Hip extension    Hip abduction [facility]  Hip adduction    Hip internal rotation    Hip external rotation    Knee flexion WFL 106/ 99 deg.  Knee extension    Ankle dorsiflexion WFL Limited by pain  Ankle plantarflexion WFL Limited by pain  Ankle inversion    Ankle eversion     (Blank rows =  not tested)  LOWER EXTREMITY MMT:    MMT Right eval Left eval  Hip flexion 4/5 3/5  Hip extension    Hip abduction 4/5 3/5  Hip adduction 4/5 4/5  Hip internal rotation 5/5 unable  Hip external rotation 5/5 3/5  Knee flexion 5/5 3/5 (pain)  Knee extension 5/5 4/5  Ankle dorsiflexion 4+/5 3/5  Ankle plantarflexion 5/5 4/5  Ankle inversion    Ankle eversion     (Blank rows = not tested)  FUNCTIONAL TESTS:  5 times sit to stand: 25.4 seconds .  9/21:  14.06 seconds (marked improvement).    GAIT: Distance walked: in clinic Assistive device utilized: Quad cane small base Level of assistance: Modified independence Comments: R sided Trendelenburg.  L antalgic gait pattern with decrease stance on L with short R LE step length/ shuffling.  No arm swing. Walks with wide base of support, with mild supination of B feet. Pt had limited endurance, and significantly limited L hip flexion and DF to clear her foot during swing phase.     01/25/22: 5xSTS: 15.94 sec./ 14.94 sec.  (Marked improvement).      9/21:  5xSTS: 14.06 seconds (marked improvement).      11/8: 5xSTS: 12.48 seconds  (improvement).      06/24/22: 9.89 seconds (marked improvement).    2/28: L/R LE strength testing: hip flexion 3-/4, quad 4+/4+, R hip abduction 4-/3+.  L knee flexion 119 deg.   Reassessment of LE muscle strength: R hip/knee strength grossly 5/5 MMT except hip abduction 4+/5.  L hip flexion 3+/5 MMT, hip abduction 4-/5, adduction 4/5, extension 4/5 MMT.    TREATMENT:  11/02/2022  Subjective:   Pt. Continues to c/o 3-4/10 L hip pain with activity.  Pt. Has continued to attend [employer] for ex. 1-2x/week.  Pt. States she has to take Ibuprofen to manage hip pain and make it through the day, esp. On rainy days.  Pt. Discussed contacting ortho MD to plan for THA.    Treatment:      There.ex.:        Amb. In hallway for 4 min. 5 sec. With use of SPC prior to a seated rest break due to increase pain.       Forward/backwards/lateral walking in //-bars 5 laps each.  Min. UE assist and progressing to no UE assist.  Mirror feedback/ cuing to increase L hip/foot in midline.      Nustep B UE/LE for 10 min at L4.  Discussed L hip/groin pain and THA.       Seated on blue mat table:  LAQ/ marching 20x.  Pt. Significantly limited with seated L hip flexion.  Seated hip abduction/ adduction with manual resistance from PT 10x (moderate resistance).      Walking at agility ladder with consistent recip. 16" step length/ heel strike while using SPC for balance/ safety.  2 laps.      Discussed HEP/ gym based ex. Program. PT encourages pool exercise at local [employer] to decrease force on L hip while encouraging movement.   PATIENT EDUCATION:  Education details: Access Code: [phone] Person educated: Patient Education method: Explanation, Demonstration, and Handouts Education comprehension: verbalized understanding and returned demonstration   HOME EXERCISE PROGRAM: Access Code: [email or web address] URL: [email or web address] Date: 11/25/2021 Prepared by: Dorene Grebe  Exercises - Straight Leg Raise  - 1 x daily - 7 x weekly - 2 sets - 10 reps - Supine Heel Slide  - 1 x daily - 7 x weekly - 2 sets - 10 reps - Supine Hip Adduction Isometric with Ball  - 1 x daily - 7 x weekly - 2 sets - 10 reps - Hooklying Gluteal Sets  - 1 x daily - 7 x weekly - 2 sets - 10 reps -  Seated Heel Raise  - 1 x daily - 7 x weekly - 2 sets - 10 reps - Seated Heel Toe Raises  - 1 x daily - 7 x weekly - 2 sets - 10 reps  Access Code: [email or web address] URL: [email or web address] Date: 06/27/2022 Prepared by: Dorene Grebe  Exercises - Straight Leg Raise  - 1 x daily - 7 x weekly - 2 sets - 10 reps - Supine Hip Adduction Isometric with Ball  - 1 x daily - 7 x weekly - 2 sets - 10 reps - Hooklying Gluteal Sets  - 1 x daily - 7 x weekly - 2 sets - 10 reps - Standing Hip Abduction  - 1 x daily - 7 x weekly - 2 sets - 12  reps - Mini Squat with Counter Support  - 1 x daily - 7 x weekly - 2 sets - 12 reps  ASSESSMENT:  CLINICAL IMPRESSION:  PT encouraged pt. To contact ortho MD to f/u on scheduling THA.  Pt. Remains limited with moderate L hip pain with increase activity/ walking/ getting into car.   Pt's pain fluctuates during tx. and she takes meds for relief.  Pt. Benefits from use of ibuprofen during the day.  Pt provided with rest breaks as needed to control pain. Pt demonstrates slow but steady progress and continues to demonstrate proximal weakness > distal, high fall risk and fair endurance.  Pt. Able to demonstrate 16" recip. 2-point gait pattern with use of SPC.  PT encouraged pt. To increase gym/ pool ex. Frequency during the week.  Pt will benefit from continue PT interventions to address pt impairments in order to achieve pt's goal.   OBJECTIVE IMPAIRMENTS Abnormal gait, decreased activity tolerance, decreased balance, decreased endurance, decreased mobility, difficulty walking, decreased ROM, decreased strength, hypomobility, impaired flexibility, impaired sensation, improper body mechanics, postural dysfunction, obesity, and pain.   ACTIVITY LIMITATIONS carrying, lifting, bending, sitting, standing, squatting, sleeping, stairs, transfers, bed mobility, dressing, and locomotion level  PARTICIPATION LIMITATIONS: cleaning, laundry, driving, shopping, community activity, occupation, and yard work  PERSONAL FACTORS Age, Education, and Past/current experiences are also affecting patient's functional outcome.   REHAB POTENTIAL: Good  CLINICAL DECISION MAKING: Evolving/moderate complexity  EVALUATION COMPLEXITY: Moderate   GOALS: Goals reviewed with patient Yes  LONG TERM GOALS: Target date: 12/13/22  Pt will increase FOTO score to > 58 Baseline: 9/7: 52 (no significant changes).  11/8: 49 (slight regression secondary to increase L anterior hip pain with stair climbing/ increase distance walked.   1/16: 46 (continued regression/ pt. Considering THA).  4/30: 52 Goal status: Not met  2.  Pt will decrease pain level to < 2/10 during all ADLs, in order to efficiently accomplish activities with decreased pain and increased independence Baseline: see above Goal status: Partially met  3.  Pt will increase Active L knee flexion ROM to > 120 degrees. Baseline: 99, 106 passive.  8/8: 118 deg.  10/25: 118 deg. 2/28: 119 deg.  Goal status: Partially met  4.  Pt. Will increase B LE muscle strength 1/2 muscle grade to improve stepping up into drivers side of car.    Baseline: see above  Goal status: Not met  PLAN: PT FREQUENCY: 1x/week  PT DURATION: 8 weeks  PLANNED INTERVENTIONS: Therapeutic exercises, Therapeutic activity, Neuromuscular re-education, Balance training, Gait training, Patient/Family education, Joint mobilization, Stair training, Aquatic Therapy, Cryotherapy, Moist heat, and Manual therapy.  PLAN FOR NEXT SESSION:  Standing dynamic ex., functional activity conditioning (walking, steps).  Discuss MD  f/u for THA   Cammie Mcgee, PT, DPT # [phone] 8:26 PM,11/02/22  [facility] [address] [place], [place], [address] Phone: [phone]   Fax:  [phone]

## 2022-11-03 ENCOUNTER — Telehealth: Payer: Self-pay | Admitting: Nurse Practitioner

## 2022-11-03 NOTE — Telephone Encounter (Signed)
Called pharmacy and notified them that the PA was approved. Pharmacy states that their Medicaid system is down so they were unaware of the approval. Pharmacy is getting the prescription ready for the patient now.

## 2022-11-03 NOTE — Telephone Encounter (Signed)
Called and notified the patient that her medication would be ready for her to pick up shortly.

## 2022-11-03 NOTE — Telephone Encounter (Signed)
Pt is calling in because she was told the PA for her medication had been approved. Pt said when she checked with the pharmacy, the pharmacy said they never received a PA for the medication. Please advise. Medication is JARDIANCE 10 MG TABS tablet [[id number]]

## 2022-11-09 ENCOUNTER — Ambulatory Visit: Payer: BLUE CROSS/BLUE SHIELD

## 2022-11-09 ENCOUNTER — Encounter: Payer: Self-pay | Admitting: Physical Therapy

## 2022-11-09 DIAGNOSIS — M25652 Stiffness of left hip, not elsewhere classified: Secondary | ICD-10-CM

## 2022-11-09 DIAGNOSIS — M25552 Pain in left hip: Secondary | ICD-10-CM

## 2022-11-09 DIAGNOSIS — M6281 Muscle weakness (generalized): Secondary | ICD-10-CM

## 2022-11-09 DIAGNOSIS — R269 Unspecified abnormalities of gait and mobility: Secondary | ICD-10-CM

## 2022-11-09 NOTE — Therapy (Signed)
OUTPATIENT PHYSICAL THERAPY THORACOLUMBAR TREATMENT  Patient Name: Yesenia Stevens MRN: [id number] DOB:11/02/1965, 57 y.o., female Today's Date: 11/09/2022   PT End of Session - 11/09/22 1435     Visit Number 53    Number of Visits 59    Date for PT Re-Evaluation 12/13/22    PT Start Time 1432    PT Stop Time 1515    PT Time Calculation (min) 43 min    Activity Tolerance Patient tolerated treatment well;Patient limited by pain    Behavior During Therapy [facility] for tasks assessed/performed            Past Medical History:  Diagnosis Date   Diabetes mellitus without complication (HCC)    Hypertension    Past Surgical History:  Procedure Laterality Date   CESAREAN SECTION     CHOLECYSTECTOMY     COLONOSCOPY WITH PROPOFOL N/A 12/10/2019   Procedure: COLONOSCOPY WITH PROPOFOL;  Surgeon: Wyline Mood, MD;  Location: [facility] ENDOSCOPY;  Service: Gastroenterology;  Laterality: N/A;   COLONOSCOPY WITH PROPOFOL N/A 12/24/2019   Procedure: COLONOSCOPY WITH PROPOFOL;  Surgeon: Wyline Mood, MD;  Location: [facility] ENDOSCOPY;  Service: Gastroenterology;  Laterality: N/A;   FRACTURE SURGERY     Patient Active Problem List   Diagnosis Date Noted   Acute streptococcal pharyngitis 10/04/2022   Hyperlipidemia associated with type 2 diabetes mellitus (HCC) 09/22/2021   Displaced fracture of base of neck of right femur, sequela 07/16/2021   Closed fracture of posterior wall of left acetabulum with routine healing 06/16/2021   Palpitations 03/19/2020   Diarrhea 03/19/2020   Premature atrial contractions 08/20/2018   Morbid (severe) obesity due to excess calories (HCC) 08/01/2018   Shortness of breath 07/26/2018   Chronic cough 03/22/2018   Uncomplicated type 2 diabetes mellitus (HCC) 03/22/2018   Hypertension associated with diabetes (HCC) 03/22/2018    PCP: Larae Grooms, NP   REFERRING PROVIDER: Ernst Bowler, MD  REFERRING DIAG:  Diagnosis  S32.15XD (ICD-10-CM) - Type 2  fracture of sacrum, subsequent encounter for fracture with routine healing  S32.422D (ICD-10-CM) - Displaced fracture of posterior wall of left acetabulum, subsequent encounter for fracture with routine healing    Rationale for Evaluation and Treatment Rehabilitation  THERAPY DIAG:  Stiffness of left hip joint  Gait difficulty  Pain in left hip  Muscle weakness (generalized)  ONSET DATE: 05/28/21  SUBJECTIVE:                                                                                                                                                                                            PERTINENT HISTORY:  Hx of L hip  dislocation, and screws placed in lower back after her injury. Pt left hospital in a walker, and utilized a WC originally during [facility]. Pt has decreased ankle DF in L ankle, and pain in her lateral foot / 5th metatarsal. Pt had L knee scope, and has tenderness/bruising in L LE along tibia.  PAIN:  Are you having pain? NPRS scale: 4-5/10 Pain location: L hip (anterior) Pain description: popping and dull/achy Aggravating factors: standing, bending forward (especially left) Relieving factors: sitting down, tylenol PRN   PRECAUTIONS: Anterior hip, Posterior hip, Knee, and Fall  WEIGHT BEARING RESTRICTIONS no  FALLS:  Has patient fallen in last 6 months Yes. Number of falls 1  LIVING ENVIRONMENT: Lives with: lives with their family Lives in: House/apartment Stairs: yes - 2 to get into home with raining Has following equipment at home: Quad cane large base and Wheelchair (manual)  OCCUPATION:  Pt is currently on long term disability. Previously worked at [employer] prior to injury. Would like to get back to work if the company can find a position that is more stationary.  PLOF: Independent  PATIENT GOALS Pt wants to alleviate pain, become more mobile, strengthen back musculature, strengthen LEs  OBJECTIVE:   DIAGNOSTIC FINDINGS:  LUMBAR SPINE - 2-3  VIEW   COMPARISON:  MRI 09/28/2009   FINDINGS: 4-5 mm retrolisthesis L5-S1, slightly progressive since prior examination, possibly degenerative in nature. Otherwise normal lumbar lordosis. No acute fracture of the lumbar spine. Vertebral body height is preserved. Intervertebral disc space narrowing and endplate remodeling at L4-5 and L5-S1 is in keeping with mild degenerative disc disease at these levels. Paraspinal soft tissues are unremarkable. Right sacroiliac arthrodesis has been performed with 2 partially threaded screws.   IMPRESSION: No acute fracture or traumatic listhesis.   Degenerative changes L4-S1 with 5 mm retrolisthesis L5-S1.     Electronically Signed   By: Helyn Numbers M.D.   On: 09/17/2021 22:13  PATIENT SURVEYS:  FOTO 52 ; 61  SCREENING FOR RED FLAGS: Bowel or bladder incontinence: No Spinal tumors: No Cauda equina syndrome: No Compression fracture: No Abdominal aneurysm: No  COGNITION:  Overall cognitive status: Within functional limits for tasks assessed     SENSATION: Not tested - patient reports altered sensation in L LE/foot  POSTURE: rounded shoulders, forward head, and weight shift left  PALPATION: Moderate L medial lower leg tenderness with palpation  LUMBAR ROM:   Active  AROM  eval  Flexion 50% limited (compensation to R)  Extension 75% limited (pain)  Right lateral flexion   Left lateral flexion   Right rotation 50% limited  Left rotation 25% limited   (Blank rows = not tested)  LOWER EXTREMITY ROM:     PROM/AROM Right eval Left eval  Hip flexion WFL 119/ 90 deg.  Hip extension    Hip abduction [facility]  Hip adduction    Hip internal rotation    Hip external rotation    Knee flexion WFL 106/ 99 deg.  Knee extension    Ankle dorsiflexion WFL Limited by pain  Ankle plantarflexion WFL Limited by pain  Ankle inversion    Ankle eversion     (Blank rows = not tested)  LOWER EXTREMITY MMT:    MMT Right eval  Left eval  Hip flexion 4/5 3/5  Hip extension    Hip abduction 4/5 3/5  Hip adduction 4/5 4/5  Hip internal rotation 5/5 unable  Hip external rotation 5/5 3/5  Knee flexion 5/5 3/5 (pain)  Knee extension 5/5 4/5  Ankle dorsiflexion 4+/5 3/5  Ankle plantarflexion 5/5 4/5  Ankle inversion    Ankle eversion     (Blank rows = not tested)  FUNCTIONAL TESTS:  5 times sit to stand: 25.4 seconds .  9/21:  14.06 seconds (marked improvement).    GAIT: Distance walked: in clinic Assistive device utilized: Quad cane small base Level of assistance: Modified independence Comments: R sided Trendelenburg.  L antalgic gait pattern with decrease stance on L with short R LE step length/ shuffling.  No arm swing. Walks with wide base of support, with mild supination of B feet. Pt had limited endurance, and significantly limited L hip flexion and DF to clear her foot during swing phase.     01/25/22: 5xSTS: 15.94 sec./ 14.94 sec.  (Marked improvement).      9/21:  5xSTS: 14.06 seconds (marked improvement).      11/8: 5xSTS: 12.48 seconds  (improvement).      06/24/22: 9.89 seconds (marked improvement).    2/28: L/R LE strength testing: hip flexion 3-/4, quad 4+/4+, R hip abduction 4-/3+.  L knee flexion 119 deg.   Reassessment of LE muscle strength: R hip/knee strength grossly 5/5 MMT except hip abduction 4+/5.  L hip flexion 3+/5 MMT, hip abduction 4-/5, adduction 4/5, extension 4/5 MMT.     TREATMENT:  11/09/22  Subjective: Pt reporting 3/10 pain in L hip. Has not heard back from orthopedic MD yet. Busy weekend with basketball tournaments and has been participating in her gym/aquatic based programs.   Treatment:      There.ex.:     Nustep B UE/LE for 10 min at L4. Not billed.    Forward/backwards/lateral walking in //-bars 5 laps each.  Min. UE assist and progressing to no UE assist.  Consistent BOS and placement with LLE throughout.    Hurdle step overs forwards/backwards and R/L lateral  steps: x4/direction with 6" hurdles. Relies on light, SUE/BUE support   Seated rainbow ball hip adduction squeezes: 2x10, 3-5 sec holds.   Seated LAQ's with 2# AW's: 2x10  2 point gait with SPC to improve standing/walking endurance. Tolerating 4 minutes, 10 seconds before needing a seated rest. Onset of worsening L hip pain with x1 episode of mild LLE buckling.   PATIENT EDUCATION:  Education details: Access Code: [email or web address] Person educated: Patient Education method: Explanation, Demonstration, and Handouts Education comprehension: verbalized understanding and returned demonstration   HOME EXERCISE PROGRAM: Access Code: [email or web address] URL: [email or web address] Date: 11/25/2021 Prepared by: Dorene Grebe  Exercises - Straight Leg Raise  - 1 x daily - 7 x weekly - 2 sets - 10 reps - Supine Heel Slide  - 1 x daily - 7 x weekly - 2 sets - 10 reps - Supine Hip Adduction Isometric with Ball  - 1 x daily - 7 x weekly - 2 sets - 10 reps - Hooklying Gluteal Sets  - 1 x daily - 7 x weekly - 2 sets - 10 reps - Seated Heel Raise  - 1 x daily - 7 x weekly - 2 sets - 10 reps - Seated Heel Toe Raises  - 1 x daily - 7 x weekly - 2 sets - 10 reps  Access Code: [email or web address] URL: [email or web address] Date: 06/27/2022 Prepared by: Dorene Grebe  Exercises - Straight Leg Raise  - 1 x daily - 7 x weekly - 2 sets - 10 reps - Supine Hip Adduction Isometric with Ball  - 1 x daily - 7 x weekly -  2 sets - 10 reps - Hooklying Gluteal Sets  - 1 x daily - 7 x weekly - 2 sets - 10 reps - Standing Hip Abduction  - 1 x daily - 7 x weekly - 2 sets - 12 reps - Mini Squat with Counter Support  - 1 x daily - 7 x weekly - 2 sets - 12 reps  ASSESSMENT:  CLINICAL IMPRESSION: Continuing PT POC with maintaining standing and seated LE strength to pt tolerance. Pt does typically rely on SUE to BUE support due to her consistent L hip pain levels with standing therex. Pt's tolerance continues to  remain in about the 4 minute range for ambulation using SPC before needing a seated rest due to moderate to severe L hip pain. Pt remaining motivated despite consistent pain levels. Encouraged pt to continue PT POC and HEP programs.  Pt will benefit from continue PT interventions to address pt impairments in order to achieve pt's goal.   OBJECTIVE IMPAIRMENTS Abnormal gait, decreased activity tolerance, decreased balance, decreased endurance, decreased mobility, difficulty walking, decreased ROM, decreased strength, hypomobility, impaired flexibility, impaired sensation, improper body mechanics, postural dysfunction, obesity, and pain.   ACTIVITY LIMITATIONS carrying, lifting, bending, sitting, standing, squatting, sleeping, stairs, transfers, bed mobility, dressing, and locomotion level  PARTICIPATION LIMITATIONS: cleaning, laundry, driving, shopping, community activity, occupation, and yard work  PERSONAL FACTORS Age, Education, and Past/current experiences are also affecting patient's functional outcome.   REHAB POTENTIAL: Good  CLINICAL DECISION MAKING: Evolving/moderate complexity  EVALUATION COMPLEXITY: Moderate   GOALS: Goals reviewed with patient Yes  LONG TERM GOALS: Target date: 12/13/22  Pt will increase FOTO score to > 58 Baseline: 9/7: 52 (no significant changes).  11/8: 49 (slight regression secondary to increase L anterior hip pain with stair climbing/ increase distance walked.  1/16: 46 (continued regression/ pt. Considering THA).  4/30: 52 Goal status: Not met  2.  Pt will decrease pain level to < 2/10 during all ADLs, in order to efficiently accomplish activities with decreased pain and increased independence Baseline: see above Goal status: Partially met  3.  Pt will increase Active L knee flexion ROM to > 120 degrees. Baseline: 99, 106 passive.  8/8: 118 deg.  10/25: 118 deg. 2/28: 119 deg.  Goal status: Partially met  4.  Pt. Will increase B LE muscle strength  1/2 muscle grade to improve stepping up into drivers side of car.    Baseline: see above  Goal status: Not met  PLAN: PT FREQUENCY: 1x/week  PT DURATION: 8 weeks  PLANNED INTERVENTIONS: Therapeutic exercises, Therapeutic activity, Neuromuscular re-education, Balance training, Gait training, Patient/Family education, Joint mobilization, Stair training, Aquatic Therapy, Cryotherapy, Moist heat, and Manual therapy.  PLAN FOR NEXT SESSION:  Standing dynamic ex., functional activity conditioning (walking, steps).  Discuss MD f/u for THA  [facility]. Fairly IV, PT, DPT Physical Therapist- [place]  [facility]  3:15 PM,11/09/22

## 2022-11-10 ENCOUNTER — Other Ambulatory Visit: Payer: Self-pay | Admitting: Internal Medicine

## 2022-11-11 ENCOUNTER — Other Ambulatory Visit: Payer: Self-pay | Admitting: Nurse Practitioner

## 2022-11-11 NOTE — Telephone Encounter (Signed)
Requested Prescriptions  Pending Prescriptions Disp Refills   valsartan (DIOVAN) 80 MG tablet [Pharmacy Med Name: Valsartan 80 MG Oral Tablet] 90 tablet 1    Sig: Take 1 tablet by mouth once daily     Cardiovascular:  Angiotensin Receptor Blockers Passed - 11/11/2022  3:23 PM      Passed - Cr in normal range and within 180 days    Creatinine, Ser  Date Value Ref Range Status  09/08/2022 0.84 0.57 - 1.00 mg/dL Final         Passed - K in normal range and within 180 days    Potassium  Date Value Ref Range Status  09/08/2022 3.9 3.5 - 5.2 mmol/L Final         Passed - Patient is not pregnant      Passed - Last BP in normal range    BP Readings from Last 1 Encounters:  10/04/22 97/67         Passed - Valid encounter within last 6 months    Recent Outpatient Visits           1 month ago Acute streptococcal pharyngitis   [place] [facility] [place], Sherran Needs, NP   2 months ago Annual physical exam   [place] [facility] Larae Grooms, NP   3 months ago Hyperlipidemia associated with type 2 diabetes mellitus [facility])   [place] [facility] Larae Grooms, NP   3 months ago Forearm sprain, right, initial encounter   [place] [facility] [place], Oswaldo Conroy, PA-C   4 months ago Abrasion of back, unspecified laterality, initial encounter   [place] [facility] Mecum, Oswaldo Conroy, PA-C       Future Appointments             In 3 weeks Larae Grooms, NP [facility], [facility]

## 2022-11-11 NOTE — Telephone Encounter (Signed)
Requested medications are due for refill today.  unsure  Requested medications are on the active medications list.  no  Last refill. 09/17/2021  Future visit scheduled.   yes  Notes to clinic.  Med was d/c'd. Refill not delegated.    Requested Prescriptions  Pending Prescriptions Disp Refills   methocarbamol (ROBAXIN) 750 MG tablet [Pharmacy Med Name: Methocarbamol 750 MG Oral Tablet] 15 tablet 0    Sig: TAKE 1 TABLET BY MOUTH EVERY 8 HOURS AS NEEDED FOR MUSCLE SPASM     Not Delegated - Analgesics:  Muscle Relaxants Failed - 11/10/2022  6:41 PM      Failed - This refill cannot be delegated      Passed - Valid encounter within last 6 months    Recent Outpatient Visits           1 month ago Acute streptococcal pharyngitis   [place] [facility] [place], Sherran Needs, NP   2 months ago Annual physical exam   [place] [facility] Larae Grooms, NP   3 months ago Hyperlipidemia associated with type 2 diabetes mellitus [facility])   [place] [facility] Larae Grooms, NP   3 months ago Forearm sprain, right, initial encounter   [place] [facility] Mecum, Oswaldo Conroy, PA-C   4 months ago Abrasion of back, unspecified laterality, initial encounter   [place] [facility] Mecum, Oswaldo Conroy, PA-C       Future Appointments             In 3 weeks Larae Grooms, NP [place] [facility], [facility]

## 2022-11-23 ENCOUNTER — Ambulatory Visit: Payer: BLUE CROSS/BLUE SHIELD | Attending: Trauma Surgery | Admitting: Physical Therapy

## 2022-11-23 DIAGNOSIS — M25652 Stiffness of left hip, not elsewhere classified: Secondary | ICD-10-CM | POA: Diagnosis present

## 2022-11-23 DIAGNOSIS — R269 Unspecified abnormalities of gait and mobility: Secondary | ICD-10-CM | POA: Insufficient documentation

## 2022-11-23 DIAGNOSIS — M25552 Pain in left hip: Secondary | ICD-10-CM | POA: Diagnosis present

## 2022-11-23 DIAGNOSIS — R2689 Other abnormalities of gait and mobility: Secondary | ICD-10-CM | POA: Insufficient documentation

## 2022-11-23 DIAGNOSIS — R29898 Other symptoms and signs involving the musculoskeletal system: Secondary | ICD-10-CM | POA: Diagnosis present

## 2022-11-23 DIAGNOSIS — M6281 Muscle weakness (generalized): Secondary | ICD-10-CM | POA: Insufficient documentation

## 2022-11-23 NOTE — Therapy (Signed)
OUTPATIENT PHYSICAL THERAPY THORACOLUMBAR TREATMENT  Patient Name: Yesenia Stevens MRN: [id number] DOB:14-Feb-1966, 57 y.o., female Today's Date: 11/23/2022   PT End of Session - 11/23/22 1840     Visit Number 54    Number of Visits 59    Date for PT Re-Evaluation 12/13/22    PT Start Time 1431    PT Stop Time 1517    PT Time Calculation (min) 46 min    Activity Tolerance Patient tolerated treatment well;Patient limited by pain    Behavior During Therapy [facility] for tasks assessed/performed            Past Medical History:  Diagnosis Date   Diabetes mellitus without complication (HCC)    Hypertension    Past Surgical History:  Procedure Laterality Date   CESAREAN SECTION     CHOLECYSTECTOMY     COLONOSCOPY WITH PROPOFOL N/A 12/10/2019   Procedure: COLONOSCOPY WITH PROPOFOL;  Surgeon: Wyline Mood, MD;  Location: [facility] ENDOSCOPY;  Service: Gastroenterology;  Laterality: N/A;   COLONOSCOPY WITH PROPOFOL N/A 12/24/2019   Procedure: COLONOSCOPY WITH PROPOFOL;  Surgeon: Wyline Mood, MD;  Location: [facility] ENDOSCOPY;  Service: Gastroenterology;  Laterality: N/A;   FRACTURE SURGERY     Patient Active Problem List   Diagnosis Date Noted   Acute streptococcal pharyngitis 10/04/2022   Hyperlipidemia associated with type 2 diabetes mellitus (HCC) 09/22/2021   Displaced fracture of base of neck of right femur, sequela 07/16/2021   Closed fracture of posterior wall of left acetabulum with routine healing 06/16/2021   Palpitations 03/19/2020   Diarrhea 03/19/2020   Premature atrial contractions 08/20/2018   Morbid (severe) obesity due to excess calories (HCC) 08/01/2018   Shortness of breath 07/26/2018   Chronic cough 03/22/2018   Uncomplicated type 2 diabetes mellitus (HCC) 03/22/2018   Hypertension associated with diabetes (HCC) 03/22/2018    PCP: Larae Grooms, NP   REFERRING PROVIDER: Ernst Bowler, MD  REFERRING DIAG:  Diagnosis  S32.15XD (ICD-10-CM) - Type 2 fracture  of sacrum, subsequent encounter for fracture with routine healing  S32.422D (ICD-10-CM) - Displaced fracture of posterior wall of left acetabulum, subsequent encounter for fracture with routine healing    Rationale for Evaluation and Treatment Rehabilitation  THERAPY DIAG:  Stiffness of left hip joint  Gait difficulty  Pain in left hip  Muscle weakness (generalized)  Balance problems  Impaired strength of hip muscles  ONSET DATE: 05/28/21  SUBJECTIVE:  PERTINENT HISTORY:  Hx of L hip dislocation, and screws placed in lower back after her injury. Pt left hospital in a walker, and utilized a WC originally during [facility]. Pt has decreased ankle DF in L ankle, and pain in her lateral foot / 5th metatarsal. Pt had L knee scope, and has tenderness/bruising in L LE along tibia.  PAIN:  Are you having pain? NPRS scale: 4-5/10 Pain location: L hip (anterior) Pain description: popping and dull/achy Aggravating factors: standing, bending forward (especially left) Relieving factors: sitting down, tylenol PRN   PRECAUTIONS: Anterior hip, Posterior hip, Knee, and Fall  WEIGHT BEARING RESTRICTIONS no  FALLS:  Has patient fallen in last 6 months Yes. Number of falls 1  LIVING ENVIRONMENT: Lives with: lives with their family Lives in: House/apartment Stairs: yes - 2 to get into home with raining Has following equipment at home: Quad cane large base and Wheelchair (manual)  OCCUPATION:  Pt is currently on long term disability. Previously worked at [employer] prior to injury. Would like to get back to work if the company can find a position that is more stationary.  PLOF: Independent  PATIENT GOALS Pt wants to alleviate pain, become more mobile, strengthen back musculature, strengthen LEs  OBJECTIVE:    DIAGNOSTIC FINDINGS:  LUMBAR SPINE - 2-3 VIEW   COMPARISON:  MRI 09/28/2009   FINDINGS: 4-5 mm retrolisthesis L5-S1, slightly progressive since prior examination, possibly degenerative in nature. Otherwise normal lumbar lordosis. No acute fracture of the lumbar spine. Vertebral body height is preserved. Intervertebral disc space narrowing and endplate remodeling at L4-5 and L5-S1 is in keeping with mild degenerative disc disease at these levels. Paraspinal soft tissues are unremarkable. Right sacroiliac arthrodesis has been performed with 2 partially threaded screws.   IMPRESSION: No acute fracture or traumatic listhesis.   Degenerative changes L4-S1 with 5 mm retrolisthesis L5-S1.     Electronically Signed   By: Helyn Numbers M.D.   On: 09/17/2021 22:13  PATIENT SURVEYS:  FOTO 52 ; 61  SCREENING FOR RED FLAGS: Bowel or bladder incontinence: No Spinal tumors: No Cauda equina syndrome: No Compression fracture: No Abdominal aneurysm: No  COGNITION:  Overall cognitive status: Within functional limits for tasks assessed     SENSATION: Not tested - patient reports altered sensation in L LE/foot  POSTURE: rounded shoulders, forward head, and weight shift left  PALPATION: Moderate L medial lower leg tenderness with palpation  LUMBAR ROM:   Active  AROM  eval  Flexion 50% limited (compensation to R)  Extension 75% limited (pain)  Right lateral flexion   Left lateral flexion   Right rotation 50% limited  Left rotation 25% limited   (Blank rows = not tested)  LOWER EXTREMITY ROM:     PROM/AROM Right eval Left eval  Hip flexion WFL 119/ 90 deg.  Hip extension    Hip abduction [facility]  Hip adduction    Hip internal rotation    Hip external rotation    Knee flexion WFL 106/ 99 deg.  Knee extension    Ankle dorsiflexion WFL Limited by pain  Ankle plantarflexion WFL Limited by pain  Ankle inversion    Ankle eversion     (Blank rows = not  tested)  LOWER EXTREMITY MMT:    MMT Right eval Left eval  Hip flexion 4/5 3/5  Hip extension    Hip abduction 4/5 3/5  Hip adduction 4/5 4/5  Hip internal rotation 5/5 unable  Hip external rotation 5/5 3/5  Knee flexion 5/5 3/5 (pain)  Knee extension 5/5 4/5  Ankle dorsiflexion 4+/5 3/5  Ankle plantarflexion 5/5 4/5  Ankle inversion    Ankle eversion     (Blank rows = not tested)  FUNCTIONAL TESTS:  5 times sit to stand: 25.4 seconds .  9/21:  14.06 seconds (marked improvement).    GAIT: Distance walked: in clinic Assistive device utilized: Quad cane small base Level of assistance: Modified independence Comments: R sided Trendelenburg.  L antalgic gait pattern with decrease stance on L with short R LE step length/ shuffling.  No arm swing. Walks with wide base of support, with mild supination of B feet. Pt had limited endurance, and significantly limited L hip flexion and DF to clear her foot during swing phase.     01/25/22: 5xSTS: 15.94 sec./ 14.94 sec.  (Marked improvement).      9/21:  5xSTS: 14.06 seconds (marked improvement).      11/8: 5xSTS: 12.48 seconds  (improvement).      06/24/22: 9.89 seconds (marked improvement).    2/28: L/R LE strength testing: hip flexion 3-/4, quad 4+/4+, R hip abduction 4-/3+.  L knee flexion 119 deg.   Reassessment of LE muscle strength: R hip/knee strength grossly 5/5 MMT except hip abduction 4+/5.  L hip flexion 3+/5 MMT, hip abduction 4-/5, adduction 4/5, extension 4/5 MMT.     TREATMENT:  11/23/2022  Subjective: Pt. Has ortho MD f/u next Tuesday (6/11) to discuss THA.  Pt. Has continued to attend aquatic ex. On a weekly basis and stay active with weekend activities.  Pt. States she was able to ascend/descend 18 stairs at friends house safely last week.  Pt. Reports benefit from taking 2 ibuprofen/day to manage discomfort in hip.    Treatment:      There.ex.:     Nustep B UE/LE for 10 min at L4. No charge.  Discussed upcoming MD  appt.    Forward/backwards/lateral walking in //-bars 5 laps each.  Min. UE assist and progressing to no UE assist.  Consistent BOS and placement with LLE throughout.   Seated LAQ's 20x/ seated L hip marching (assist with initial attempts/ holds)- limited.   2 point gait with SPC to improve standing/walking endurance. Walking in hallway/ outside (around front of clinic) with consistent walking pattern/ cadence.  L antalgic gait but more controlled midline positioning/ heel strike noted today.    Reviewed HEP  PATIENT EDUCATION:  Education details: Access Code: [phone] Person educated: Patient Education method: Explanation, Demonstration, and Handouts Education comprehension: verbalized understanding and returned demonstration   HOME EXERCISE PROGRAM: Access Code: [email or web address] URL: [email or web address] Date: 11/25/2021 Prepared by: Dorene Grebe  Exercises - Straight Leg Raise  - 1 x daily - 7 x weekly - 2 sets - 10 reps - Supine Heel Slide  - 1 x daily - 7 x weekly - 2 sets - 10 reps - Supine Hip Adduction Isometric with Ball  - 1 x daily - 7 x weekly - 2 sets - 10 reps - Hooklying Gluteal Sets  - 1 x daily - 7 x weekly - 2 sets - 10 reps - Seated Heel Raise  - 1 x daily - 7 x weekly - 2 sets - 10 reps - Seated Heel Toe Raises  - 1 x daily - 7 x weekly - 2 sets - 10 reps  Access Code: [email or web address] URL: [email or web address] Date: 06/27/2022 Prepared by: Dorene Grebe  Exercises - Straight Leg Raise  - 1 x daily - 7  x weekly - 2 sets - 10 reps - Supine Hip Adduction Isometric with Ball  - 1 x daily - 7 x weekly - 2 sets - 10 reps - Hooklying Gluteal Sets  - 1 x daily - 7 x weekly - 2 sets - 10 reps - Standing Hip Abduction  - 1 x daily - 7 x weekly - 2 sets - 12 reps - Mini Squat with Counter Support  - 1 x daily - 7 x weekly - 2 sets - 12 reps  ASSESSMENT:  CLINICAL IMPRESSION: Continuing PT POC with maintaining standing and seated LE strength to pt  tolerance. Pt does typically rely on SUE to BUE support due to her consistent L hip pain levels with standing therex. Pt's tolerance continues to remain in about the 4 minute range for ambulation using SPC before needing a seated rest due to moderate to severe L hip pain. Pt remaining motivated despite consistent pain levels. Encouraged pt to continue PT POC and HEP programs.  Pt. Will f/u with MD on 6/11 prior to next PT session to discuss THA.   Pt will benefit from continue PT interventions to address pt impairments in order to achieve pt's goal.   OBJECTIVE IMPAIRMENTS Abnormal gait, decreased activity tolerance, decreased balance, decreased endurance, decreased mobility, difficulty walking, decreased ROM, decreased strength, hypomobility, impaired flexibility, impaired sensation, improper body mechanics, postural dysfunction, obesity, and pain.   ACTIVITY LIMITATIONS carrying, lifting, bending, sitting, standing, squatting, sleeping, stairs, transfers, bed mobility, dressing, and locomotion level  PARTICIPATION LIMITATIONS: cleaning, laundry, driving, shopping, community activity, occupation, and yard work  PERSONAL FACTORS Age, Education, and Past/current experiences are also affecting patient's functional outcome.   REHAB POTENTIAL: Good  CLINICAL DECISION MAKING: Evolving/moderate complexity  EVALUATION COMPLEXITY: Moderate   GOALS: Goals reviewed with patient Yes  LONG TERM GOALS: Target date: 12/13/22  Pt will increase FOTO score to > 58 Baseline: 9/7: 52 (no significant changes).  11/8: 49 (slight regression secondary to increase L anterior hip pain with stair climbing/ increase distance walked.  1/16: 46 (continued regression/ pt. Considering THA).  4/30: 52 Goal status: Not met  2.  Pt will decrease pain level to < 2/10 during all ADLs, in order to efficiently accomplish activities with decreased pain and increased independence Baseline: see above Goal status: Partially  met  3.  Pt will increase Active L knee flexion ROM to > 120 degrees. Baseline: 99, 106 passive.  8/8: 118 deg.  10/25: 118 deg. 2/28: 119 deg.  Goal status: Partially met  4.  Pt. Will increase B LE muscle strength 1/2 muscle grade to improve stepping up into drivers side of car.    Baseline: see above  Goal status: Not met  PLAN: PT FREQUENCY: 1x/week  PT DURATION: 8 weeks  PLANNED INTERVENTIONS: Therapeutic exercises, Therapeutic activity, Neuromuscular re-education, Balance training, Gait training, Patient/Family education, Joint mobilization, Stair training, Aquatic Therapy, Cryotherapy, Moist heat, and Manual therapy.  PLAN FOR NEXT SESSION:  Standing dynamic ex., functional activity conditioning (walking, steps).  Discuss MD f/u for THA  Cammie Mcgee, PT, DPT # [phone] Physical Therapist- [place]  [facility]  6:43 PM,11/23/22

## 2022-11-28 ENCOUNTER — Other Ambulatory Visit: Payer: Self-pay | Admitting: Nurse Practitioner

## 2022-11-29 ENCOUNTER — Telehealth: Payer: Self-pay | Admitting: Nurse Practitioner

## 2022-11-29 DIAGNOSIS — Z6841 Body Mass Index (BMI) 40.0 and over, adult: Secondary | ICD-10-CM | POA: Diagnosis not present

## 2022-11-29 DIAGNOSIS — M1652 Unilateral post-traumatic osteoarthritis, left hip: Secondary | ICD-10-CM | POA: Diagnosis not present

## 2022-11-29 NOTE — Telephone Encounter (Signed)
Copied from CRM [id number]. Topic: General - Other >> Nov 29, 2022  4:04 PM Alfred Levins wrote: Patient Yesenia Stevens, spoke with Dr Geralyn Corwin, [phone], fax [phone] and he suggested that I start takin NSaids instead of IBuprohen, but he needs your permission Please advise

## 2022-11-30 ENCOUNTER — Ambulatory Visit: Payer: BLUE CROSS/BLUE SHIELD | Admitting: Physical Therapy

## 2022-11-30 ENCOUNTER — Encounter: Payer: Self-pay | Admitting: Physical Therapy

## 2022-11-30 DIAGNOSIS — R269 Unspecified abnormalities of gait and mobility: Secondary | ICD-10-CM

## 2022-11-30 DIAGNOSIS — M6281 Muscle weakness (generalized): Secondary | ICD-10-CM

## 2022-11-30 DIAGNOSIS — R2689 Other abnormalities of gait and mobility: Secondary | ICD-10-CM

## 2022-11-30 DIAGNOSIS — M25652 Stiffness of left hip, not elsewhere classified: Secondary | ICD-10-CM

## 2022-11-30 DIAGNOSIS — R29898 Other symptoms and signs involving the musculoskeletal system: Secondary | ICD-10-CM

## 2022-11-30 DIAGNOSIS — M25552 Pain in left hip: Secondary | ICD-10-CM

## 2022-11-30 NOTE — Telephone Encounter (Signed)
Requested Prescriptions  Pending Prescriptions Disp Refills   amLODipine (NORVASC) 10 MG tablet [Pharmacy Med Name: amLODIPine Besylate 10 MG Oral Tablet] 90 tablet 1    Sig: Take 1 tablet by mouth once daily     Cardiovascular: Calcium Channel Blockers 2 Passed - 11/28/2022 11:27 PM      Passed - Last BP in normal range    BP Readings from Last 1 Encounters:  10/04/22 97/67         Passed - Last Heart Rate in normal range    Pulse Readings from Last 1 Encounters:  10/04/22 93         Passed - Valid encounter within last 6 months    Recent Outpatient Visits           1 month ago Acute streptococcal pharyngitis   [place] [facility] [place], Sherran Needs, NP   2 months ago Annual physical exam   [facility] Larae Grooms, NP   3 months ago Hyperlipidemia associated with type 2 diabetes mellitus (HCC)   [place] [facility] Larae Grooms, NP   3 months ago Forearm sprain, right, initial encounter   [place] [facility] M[place], Oswaldo Conroy, PA-C   4 months ago Abrasion of back, unspecified laterality, initial encounter   [place] [facility] Mecum, Oswaldo Conroy, PA-C       Future Appointments             In 1 week Larae Grooms, NP  [facility], [facility]

## 2022-11-30 NOTE — Therapy (Signed)
OUTPATIENT PHYSICAL THERAPY THORACOLUMBAR TREATMENT  Patient Name: Yesenia Stevens MRN: [id number] DOB:09/13/1965, 57 y.o., female Today's Date: 11/30/2022   PT End of Session - 11/30/22 1351     Visit Number 55    Number of Visits 59    Date for PT Re-Evaluation 12/13/22    PT Start Time 1344    PT Stop Time 1433    PT Time Calculation (min) 49 min    Activity Tolerance Patient tolerated treatment well;Patient limited by pain    Behavior During Therapy [facility] for tasks assessed/performed            Past Medical History:  Diagnosis Date   Diabetes mellitus without complication (HCC)    Hypertension    Past Surgical History:  Procedure Laterality Date   CESAREAN SECTION     CHOLECYSTECTOMY     COLONOSCOPY WITH PROPOFOL N/A 12/10/2019   Procedure: COLONOSCOPY WITH PROPOFOL;  Surgeon: Wyline Mood, MD;  Location: [facility] ENDOSCOPY;  Service: Gastroenterology;  Laterality: N/A;   COLONOSCOPY WITH PROPOFOL N/A 12/24/2019   Procedure: COLONOSCOPY WITH PROPOFOL;  Surgeon: Wyline Mood, MD;  Location: [facility] ENDOSCOPY;  Service: Gastroenterology;  Laterality: N/A;   FRACTURE SURGERY     Patient Active Problem List   Diagnosis Date Noted   Acute streptococcal pharyngitis 10/04/2022   Hyperlipidemia associated with type 2 diabetes mellitus (HCC) 09/22/2021   Displaced fracture of base of neck of right femur, sequela 07/16/2021   Closed fracture of posterior wall of left acetabulum with routine healing 06/16/2021   Palpitations 03/19/2020   Diarrhea 03/19/2020   Premature atrial contractions 08/20/2018   Morbid (severe) obesity due to excess calories (HCC) 08/01/2018   Shortness of breath 07/26/2018   Chronic cough 03/22/2018   Uncomplicated type 2 diabetes mellitus (HCC) 03/22/2018   Hypertension associated with diabetes (HCC) 03/22/2018    PCP: Larae Grooms, NP   REFERRING PROVIDER: Ernst Bowler, MD  REFERRING DIAG:  Diagnosis  S32.15XD (ICD-10-CM) - Type 2  fracture of sacrum, subsequent encounter for fracture with routine healing  S32.422D (ICD-10-CM) - Displaced fracture of posterior wall of left acetabulum, subsequent encounter for fracture with routine healing    Rationale for Evaluation and Treatment Rehabilitation  THERAPY DIAG:  Stiffness of left hip joint  Gait difficulty  Pain in left hip  Muscle weakness (generalized)  Balance problems  Impaired strength of hip muscles  Stiffness of left hip, not elsewhere classified  ONSET DATE: 05/28/21  SUBJECTIVE:  PERTINENT HISTORY:  Hx of L hip dislocation, and screws placed in lower back after her injury. Pt left hospital in a walker, and utilized a WC originally during [facility]. Pt has decreased ankle DF in L ankle, and pain in her lateral foot / 5th metatarsal. Pt had L knee scope, and has tenderness/bruising in L LE along tibia.  PAIN:  Are you having pain? NPRS scale: 4-5/10 Pain location: L hip (anterior) Pain description: popping and dull/achy Aggravating factors: standing, bending forward (especially left) Relieving factors: sitting down, tylenol PRN   PRECAUTIONS: Anterior hip, Posterior hip, Knee, and Fall  WEIGHT BEARING RESTRICTIONS no  FALLS:  Has patient fallen in last 6 months Yes. Number of falls 1  LIVING ENVIRONMENT: Lives with: lives with their family Lives in: House/apartment Stairs: yes - 2 to get into home with raining Has following equipment at home: Quad cane large base and Wheelchair (manual)  OCCUPATION:  Pt is currently on long term disability. Previously worked at [employer] prior to injury. Would like to get back to work if the company can find a position that is more stationary.  PLOF: Independent  PATIENT GOALS Pt wants to alleviate pain, become more  mobile, strengthen back musculature, strengthen LEs  OBJECTIVE:   DIAGNOSTIC FINDINGS:  LUMBAR SPINE - 2-3 VIEW   COMPARISON:  MRI 09/28/2009   FINDINGS: 4-5 mm retrolisthesis L5-S1, slightly progressive since prior examination, possibly degenerative in nature. Otherwise normal lumbar lordosis. No acute fracture of the lumbar spine. Vertebral body height is preserved. Intervertebral disc space narrowing and endplate remodeling at L4-5 and L5-S1 is in keeping with mild degenerative disc disease at these levels. Paraspinal soft tissues are unremarkable. Right sacroiliac arthrodesis has been performed with 2 partially threaded screws.   IMPRESSION: No acute fracture or traumatic listhesis.   Degenerative changes L4-S1 with 5 mm retrolisthesis L5-S1.     Electronically Signed   By: Helyn Numbers M.D.   On: 09/17/2021 22:13  PATIENT SURVEYS:  FOTO 52 ; 61  SCREENING FOR RED FLAGS: Bowel or bladder incontinence: No Spinal tumors: No Cauda equina syndrome: No Compression fracture: No Abdominal aneurysm: No  COGNITION:  Overall cognitive status: Within functional limits for tasks assessed     SENSATION: Not tested - patient reports altered sensation in L LE/foot  POSTURE: rounded shoulders, forward head, and weight shift left  PALPATION: Moderate L medial lower leg tenderness with palpation  LUMBAR ROM:   Active  AROM  eval  Flexion 50% limited (compensation to R)  Extension 75% limited (pain)  Right lateral flexion   Left lateral flexion   Right rotation 50% limited  Left rotation 25% limited   (Blank rows = not tested)  LOWER EXTREMITY ROM:     PROM/AROM Right eval Left eval  Hip flexion WFL 119/ 90 deg.  Hip extension    Hip abduction [facility]  Hip adduction    Hip internal rotation    Hip external rotation    Knee flexion WFL 106/ 99 deg.  Knee extension    Ankle dorsiflexion WFL Limited by pain  Ankle plantarflexion WFL Limited by pain  Ankle  inversion    Ankle eversion     (Blank rows = not tested)  LOWER EXTREMITY MMT:    MMT Right eval Left eval  Hip flexion 4/5 3/5  Hip extension    Hip abduction 4/5 3/5  Hip adduction 4/5 4/5  Hip internal rotation 5/5 unable  Hip external rotation 5/5 3/5  Knee flexion 5/5 3/5 (pain)  Knee extension 5/5 4/5  Ankle dorsiflexion 4+/5 3/5  Ankle plantarflexion 5/5 4/5  Ankle inversion    Ankle eversion     (Blank rows = not tested)  FUNCTIONAL TESTS:  5 times sit to stand: 25.4 seconds .  9/21:  14.06 seconds (marked improvement).    GAIT: Distance walked: in clinic Assistive device utilized: Quad cane small base Level of assistance: Modified independence Comments: R sided Trendelenburg.  L antalgic gait pattern with decrease stance on L with short R LE step length/ shuffling.  No arm swing. Walks with wide base of support, with mild supination of B feet. Pt had limited endurance, and significantly limited L hip flexion and DF to clear her foot during swing phase.     01/25/22: 5xSTS: 15.94 sec./ 14.94 sec.  (Marked improvement).      9/21:  5xSTS: 14.06 seconds (marked improvement).      11/8: 5xSTS: 12.48 seconds  (improvement).      06/24/22: 9.89 seconds (marked improvement).    2/28: L/R LE strength testing: hip flexion 3-/4, quad 4+/4+, R hip abduction 4-/3+.  L knee flexion 119 deg.   Reassessment of LE muscle strength: R hip/knee strength grossly 5/5 MMT except hip abduction 4+/5.  L hip flexion 3+/5 MMT, hip abduction 4-/5, adduction 4/5, extension 4/5 MMT.     TREATMENT:  11/30/2022  Subjective: Pt. Saw Dr. Emelda Brothers ([facility]o MD at [facility]) and MD recommended pt. Lose 40 lbs before scheduling THA.   Pt. Has continued to attend aquatic ex. On a weekly basis and stay active with weekend activities (attended basketball game in [place] last weekend).  Pt. continues to benefit from taking 2 ibuprofen/day to manage discomfort in hip.  Pt. Returns to Dr. Emelda Brothers in 3 months.   Pt. Reports a low 3/10 L hip pain prior to tx. Session.   Treatment:      There.ex.:      Nustep B UE/LE for 10 min at L4. No charge.  Discussed MD visit/ hip pain.      L/R LE lunges on 1st step (5x)/2nd step (5x) with handrails (as tolerated).  Short static holds as tolerated.       Forward/backwards/lateral walking in //-bars 5 laps each.  Min. UE assist and progressing to no UE assist.  Consistent BOS and placement with LLE throughout.  Maintaining L foot in midline.    Seated LAQ's 20x/ seated L hip marching (assist with initial attempts/ holds)- limited.     2 point gait with SPC to improve standing/walking endurance. Walking in hallway/ outside (around front of clinic) with consistent walking pattern/ cadence.  L antalgic gait but more controlled midline positioning/ heel strike noted today.    Reviewed HEP  PATIENT EDUCATION:  Education details: Access Code: [phone] Person educated: Patient Education method: Explanation, Demonstration, and Handouts Education comprehension: verbalized understanding and returned demonstration   HOME EXERCISE PROGRAM: Access Code: [email or web address] URL: [email or web address] Date: 11/25/2021 Prepared by: Dorene Grebe  Exercises - Straight Leg Raise  - 1 x daily - 7 x weekly - 2 sets - 10 reps - Supine Heel Slide  - 1 x daily - 7 x weekly - 2 sets - 10 reps - Supine Hip Adduction Isometric with Ball  - 1 x daily - 7 x weekly - 2 sets - 10 reps - Hooklying Gluteal Sets  - 1 x daily - 7 x weekly - 2 sets - 10 reps - Seated Heel Raise  -  1 x daily - 7 x weekly - 2 sets - 10 reps - Seated Heel Toe Raises  - 1 x daily - 7 x weekly - 2 sets - 10 reps  Access Code: [email or web address] URL: [email or web address] Date: 06/27/2022 Prepared by: Dorene Grebe  Exercises - Straight Leg Raise  - 1 x daily - 7 x weekly - 2 sets - 10 reps - Supine Hip Adduction Isometric with Ball  - 1 x daily - 7 x weekly - 2 sets - 10 reps - Hooklying  Gluteal Sets  - 1 x daily - 7 x weekly - 2 sets - 10 reps - Standing Hip Abduction  - 1 x daily - 7 x weekly - 2 sets - 12 reps - Mini Squat with Counter Support  - 1 x daily - 7 x weekly - 2 sets - 12 reps  ASSESSMENT:  CLINICAL IMPRESSION: Continuing PT POC with maintaining standing and seated LE strength to pt tolerance. Pt does typically rely on SUE to BUE support due to her consistent L hip pain levels with standing therex. Pt's tolerance continues to remain in about the 4 minute range for ambulation using SPC before needing a seated rest due to moderate to severe L hip pain. Pt remaining motivated despite consistent pain levels. Encouraged pt to continue PT POC and HEP programs.   Pt will benefit from continue PT interventions to address pt impairments in order to achieve pt's goal.   OBJECTIVE IMPAIRMENTS Abnormal gait, decreased activity tolerance, decreased balance, decreased endurance, decreased mobility, difficulty walking, decreased ROM, decreased strength, hypomobility, impaired flexibility, impaired sensation, improper body mechanics, postural dysfunction, obesity, and pain.   ACTIVITY LIMITATIONS carrying, lifting, bending, sitting, standing, squatting, sleeping, stairs, transfers, bed mobility, dressing, and locomotion level  PARTICIPATION LIMITATIONS: cleaning, laundry, driving, shopping, community activity, occupation, and yard work  PERSONAL FACTORS Age, Education, and Past/current experiences are also affecting patient's functional outcome.   REHAB POTENTIAL: Good  CLINICAL DECISION MAKING: Evolving/moderate complexity  EVALUATION COMPLEXITY: Moderate   GOALS: Goals reviewed with patient Yes  LONG TERM GOALS: Target date: 12/13/22  Pt will increase FOTO score to > 58 Baseline: 9/7: 52 (no significant changes).  11/8: 49 (slight regression secondary to increase L anterior hip pain with stair climbing/ increase distance walked.  1/16: 46 (continued regression/ pt.  Considering THA).  4/30: 52 Goal status: Not met  2.  Pt will decrease pain level to < 2/10 during all ADLs, in order to efficiently accomplish activities with decreased pain and increased independence Baseline: see above Goal status: Partially met  3.  Pt will increase Active L knee flexion ROM to > 120 degrees. Baseline: 99, 106 passive.  8/8: 118 deg.  10/25: 118 deg. 2/28: 119 deg.  Goal status: Partially met  4.  Pt. Will increase B LE muscle strength 1/2 muscle grade to improve stepping up into drivers side of car.    Baseline: see above  Goal status: Not met  PLAN: PT FREQUENCY: 1x/week  PT DURATION: 8 weeks  PLANNED INTERVENTIONS: Therapeutic exercises, Therapeutic activity, Neuromuscular re-education, Balance training, Gait training, Patient/Family education, Joint mobilization, Stair training, Aquatic Therapy, Cryotherapy, Moist heat, and Manual therapy.  PLAN FOR NEXT SESSION:  Standing dynamic ex., functional activity conditioning (walking, steps).  CHECK SCHEDULE  Cammie Mcgee, PT, DPT # [phone] Physical Therapist- [place]  [facility]  3:43 PM,11/30/22

## 2022-12-01 NOTE — Telephone Encounter (Signed)
Called to get more information from the patient. She states that she is unsure if they need a letter or verbal or what exactly they need.   Routing to provider to advise. Will then call Dr. Neila Gear office.

## 2022-12-02 NOTE — Telephone Encounter (Signed)
Patient has high blood pressure and diabetes.  I do not recommend taking ibuprofen which is an NSAID or any other NSAIDS.

## 2022-12-02 NOTE — Telephone Encounter (Signed)
Called and LVM asking for patient to please return my call.   OK for PEC to give the patient Karen's message if she calls back.

## 2022-12-05 ENCOUNTER — Ambulatory Visit: Payer: BLUE CROSS/BLUE SHIELD

## 2022-12-05 NOTE — Telephone Encounter (Signed)
Called and LVM with Dr. Neila Gear office asking for them to please return my call to discuss.

## 2022-12-06 NOTE — Telephone Encounter (Signed)
Called and notified the patient of the discussion with [facility]. Patient states she will discuss further at her visit tomorrow with Clydie Braun.

## 2022-12-06 NOTE — Telephone Encounter (Signed)
Received a call back from Dr. Neila Gear office and spoke with a nurse. I advised her of the phone call that the patient called in with initially and Karen's response. She states that that is not what Dr. Emelda Brothers advised of the patient. She states that he advised the patient to follow up with her PCP due to the prolonged use of a higher does of ibuprofen and that she should continue PT.   Will call and discuss with the patient.

## 2022-12-07 ENCOUNTER — Ambulatory Visit: Payer: Medicaid Other | Admitting: Nurse Practitioner

## 2022-12-07 ENCOUNTER — Encounter: Payer: Self-pay | Admitting: Nurse Practitioner

## 2022-12-07 VITALS — BP 136/89 | HR 86 | Temp 98.3°F | Wt 280.8 lb

## 2022-12-07 DIAGNOSIS — E785 Hyperlipidemia, unspecified: Secondary | ICD-10-CM | POA: Diagnosis not present

## 2022-12-07 DIAGNOSIS — E119 Type 2 diabetes mellitus without complications: Secondary | ICD-10-CM

## 2022-12-07 DIAGNOSIS — E1169 Type 2 diabetes mellitus with other specified complication: Secondary | ICD-10-CM | POA: Diagnosis not present

## 2022-12-07 DIAGNOSIS — I152 Hypertension secondary to endocrine disorders: Secondary | ICD-10-CM | POA: Diagnosis not present

## 2022-12-07 DIAGNOSIS — S72041S Displaced fracture of base of neck of right femur, sequela: Secondary | ICD-10-CM

## 2022-12-07 DIAGNOSIS — E1159 Type 2 diabetes mellitus with other circulatory complications: Secondary | ICD-10-CM

## 2022-12-07 DIAGNOSIS — Z7984 Long term (current) use of oral hypoglycemic drugs: Secondary | ICD-10-CM

## 2022-12-07 MED ORDER — HYDROCHLOROTHIAZIDE 25 MG PO TABS: 25.0000 mg | ORAL_TABLET | Freq: Every day | ORAL | 1 refills | Status: DC

## 2022-12-07 MED ORDER — TIRZEPATIDE 7.5 MG/0.5ML ~~LOC~~ SOAJ: 7.5000 mg | SUBCUTANEOUS | 1 refills | Status: DC

## 2022-12-07 MED ORDER — AMLODIPINE BESYLATE 10 MG PO TABS: 10.0000 mg | ORAL_TABLET | Freq: Every day | ORAL | 1 refills | Status: DC

## 2022-12-07 MED ORDER — VALSARTAN 80 MG PO TABS: 80.0000 mg | ORAL_TABLET | Freq: Every day | ORAL | 1 refills | Status: DC

## 2022-12-07 MED ORDER — EMPAGLIFLOZIN 10 MG PO TABS: 10.0000 mg | ORAL_TABLET | Freq: Every day | ORAL | 1 refills | Status: DC

## 2022-12-07 MED ORDER — METFORMIN HCL 1000 MG PO TABS: 1000.0000 mg | ORAL_TABLET | Freq: Two times a day (BID) | ORAL | 1 refills | Status: DC

## 2022-12-07 MED ORDER — GLIPIZIDE 10 MG PO TABS: 10.0000 mg | ORAL_TABLET | Freq: Two times a day (BID) | ORAL | 1 refills | Status: DC

## 2022-12-07 MED ORDER — ROSUVASTATIN CALCIUM 5 MG PO TABS: 5.0000 mg | ORAL_TABLET | Freq: Every day | ORAL | 1 refills | Status: DC

## 2022-12-07 NOTE — Assessment & Plan Note (Signed)
Working with PT to help with pain.  Hoping to have hip replacement.  Does take Ibuprofen 200mg  in the AM and 200mg  in the PM.  Discussed adverse effects of taking daily NSAIDS.  Tylenol and Voltaren are not effective for patient's pain.

## 2022-12-07 NOTE — Assessment & Plan Note (Signed)
Recommended eating smaller high protein, low fat meals more frequently and exercising 30 mins a day 5 times a week with a goal of 10-15lb weight loss in the next 3 months.  

## 2022-12-07 NOTE — Progress Notes (Signed)
BP 136/89   Pulse 86   Temp 98.3 F (36.8 C) (Oral)   Wt 280 lb 12.8 oz (127.4 kg)   LMP 08/20/2015 (Approximate) Comment: denies preg  BMI 48.20 kg/m    Subjective:    Patient ID: [name], female    DOB: 1965/08/20, 57 y.o.   MRN: [id number]  HPI: Delphinia Sendra is a 57 y.o. female   Chief Complaint  Patient presents with   Hyperlipidemia   Diabetes   Hypertension    HYPERTENSION / HYPERLIPIDEMIA Taking amlodipine, HCTZ, Valsartan, and Rosuvastatin daily.  Satisfied with current treatment? no Duration of hypertension: chronic BP monitoring frequency: not checking BP range:  BP medication side effects: no Past BP meds:  Duration of hyperlipidemia: chronic Cholesterol medication side effects: no Cholesterol supplements: none Past cholesterol medications:  Medication compliance: excellent compliance Aspirin: no Recent stressors: no Recurrent headaches: no Visual changes: no Palpitations: no Dyspnea: no Chest pain: no Lower extremity edema: no Dizzy/lightheaded: no   The 10-year ASCVD risk score (Arnett DK, et al., 2019) is: 12%   Values used to calculate the score:     Age: 72 years     Sex: Female     Is Non-Hispanic African American: Yes     Diabetic: Yes     Tobacco smoker: No     Systolic Blood Pressure: 136 mmHg     Is BP treated: Yes     HDL Cholesterol: 46 mg/dL     Total Cholesterol: 140 mg/dL   DIABETES Last W0J 8.1%. She is taking Metformin 500mg  x2 day which was originally ordered for 1000mg  x2 day, glipizide, and Mounjaro weekly. Not having any issues with her medication.   Hypoglycemic episodes:no Polydipsia/polyuria: no Visual disturbance: no Chest pain: no Paresthesias: no Glucose Monitoring: no  Accucheck frequency: Not Checking Taking Insulin?: no Blood Pressure Monitoring: not checking Retinal Examination: Up to Date Foot Exam: Up to Date Diabetic Education: Completed Influenza:  Not interested Aspirin: no    Patient states she is having left hip pain.  She is working with physical therapy which is helping her pain.  However, she does take ibuprofen 200mg  twice daily.  Sometimes she does take more.  If she goes without taking the medication that is when the pain gets worse.  Tylenol does not help with her pain and she is tried voltaren gel which does not help with the pain either.  She is working to get a hip replacement.   Relevant past medical, surgical, family and social history reviewed and updated as indicated. Interim medical history since our last visit reviewed. Allergies and medications reviewed and updated.  Review of Systems  Eyes:  Negative for visual disturbance.  Respiratory:  Negative for shortness of breath.   Cardiovascular:  Negative for chest pain, palpitations and leg swelling.  Endocrine: Negative for polydipsia, polyphagia and polyuria.  Musculoskeletal:        Left hip pain  Neurological:  Negative for dizziness, light-headedness and headaches.    Per HPI unless specifically indicated above     Objective:    BP 136/89   Pulse 86   Temp 98.3 F (36.8 C) (Oral)   Wt 280 lb 12.8 oz (127.4 kg)   LMP 08/20/2015 (Approximate) Comment: denies preg  BMI 48.20 kg/m   Wt Readings from Last 3 Encounters:  12/07/22 280 lb 12.8 oz (127.4 kg)  10/04/22 274 lb 8 oz (124.5 kg)  09/08/22 285 lb 14.4 oz (129.7 kg)  Physical Exam Vitals and nursing note reviewed.  Constitutional:      General: She is awake. She is not in acute distress.    Appearance: She is well-developed and well-groomed. She is obese. She is not ill-appearing.  HENT:     Head: Normocephalic and atraumatic.     Right Ear: Hearing and external ear normal. No drainage.     Left Ear: Hearing and external ear normal. No drainage.     Nose: Nose normal.  Eyes:     General: Lids are normal.        Right eye: No discharge.        Left eye: No discharge.     Conjunctiva/sclera: Conjunctivae normal.   Cardiovascular:     Rate and Rhythm: Normal rate and regular rhythm.     Heart sounds: Normal heart sounds, S1 normal and S2 normal. No murmur heard.    No gallop.  Pulmonary:     Effort: No accessory muscle usage or respiratory distress.     Breath sounds: Normal breath sounds.  Musculoskeletal:     Right lower leg: No edema.     Left lower leg: No edema.     Comments: Uses cane.  Skin:    General: Skin is warm and dry.     Capillary Refill: Capillary refill takes less than 2 seconds.  Neurological:     Mental Status: She is alert and oriented to person, place, and time.  Psychiatric:        Attention and Perception: Attention normal.        Mood and Affect: Mood normal.        Speech: Speech normal.        Behavior: Behavior normal. Behavior is cooperative.        Thought Content: Thought content normal.     Results for orders placed or performed in visit on 10/04/22  Rapid Strep screen([facility])   Specimen: Other   Other  Result Value Ref Range   Strep Gp A Ag, IA W/Reflex Positive (A) Negative      Assessment & Plan:   Problem List Items Addressed This Visit       Cardiovascular and Mediastinum   Hypertension associated with diabetes (HCC) - Primary    Chronic.  Controlled.  Continue with current medication regimen of Amlodipine, HCTZ and Valsartan daily.  Refills sent today.  Labs ordered today.  Return to clinic in 6 months for reevaluation.  Call sooner if concerns arise.        Relevant Medications   amLODipine (NORVASC) 10 MG tablet   glipiZIDE (GLUCOTROL) 10 MG tablet   hydrochlorothiazide (HYDRODIURIL) 25 MG tablet   empagliflozin (JARDIANCE) 10 MG TABS tablet   metFORMIN (GLUCOPHAGE) 1000 MG tablet   rosuvastatin (CRESTOR) 5 MG tablet   tirzepatide (MOUNJARO) 7.5 MG/0.5ML Pen   valsartan (DIOVAN) 80 MG tablet   Other Relevant Orders   Comp Met (CMET)     Endocrine   Uncomplicated type 2 diabetes mellitus (HCC)    Chronic. Controlled.   Last A1c was 6.0%.  Continue with Mounjaro 7.5mg , Metformin and Jardiance.  Eye exam up to date.  Follow up in 6 months.  Call sooner if concerns arise.       Relevant Medications   glipiZIDE (GLUCOTROL) 10 MG tablet   empagliflozin (JARDIANCE) 10 MG TABS tablet   metFORMIN (GLUCOPHAGE) 1000 MG tablet   rosuvastatin (CRESTOR) 5 MG tablet   tirzepatide (MOUNJARO) 7.5 MG/0.5ML Pen  valsartan (DIOVAN) 80 MG tablet   Other Relevant Orders   HgB A1c   Hyperlipidemia associated with type 2 diabetes mellitus (HCC)    Chronic.  Controlled.  Continue with current medication regimen of Rosuvastatin daily.  Labs ordered today.  Return to clinic in 6 months for reevaluation.  Call sooner if concerns arise.          Relevant Medications   amLODipine (NORVASC) 10 MG tablet   glipiZIDE (GLUCOTROL) 10 MG tablet   hydrochlorothiazide (HYDRODIURIL) 25 MG tablet   empagliflozin (JARDIANCE) 10 MG TABS tablet   metFORMIN (GLUCOPHAGE) 1000 MG tablet   rosuvastatin (CRESTOR) 5 MG tablet   tirzepatide (MOUNJARO) 7.5 MG/0.5ML Pen   valsartan (DIOVAN) 80 MG tablet   Other Relevant Orders   Lipid Profile     Musculoskeletal and Integument   Displaced fracture of base of neck of right femur, sequela    Working with PT to help with pain.  Hoping to have hip replacement.  Does take Ibuprofen 200mg  in the AM and 200mg  in the PM.  Discussed adverse effects of taking daily NSAIDS.  Tylenol and Voltaren are not effective for patient's pain.        Other   Morbid (severe) obesity due to excess calories (HCC)    Recommended eating smaller high protein, low fat meals more frequently and exercising 30 mins a day 5 times a week with a goal of 10-15lb weight loss in the next 3 months.       Relevant Medications   glipiZIDE (GLUCOTROL) 10 MG tablet   empagliflozin (JARDIANCE) 10 MG TABS tablet   metFORMIN (GLUCOPHAGE) 1000 MG tablet   tirzepatide (MOUNJARO) 7.5 MG/0.5ML Pen     Follow up plan: Return in  about 6 months (around 06/08/2023) for HTN, HLD, DM2 FU.

## 2022-12-07 NOTE — Assessment & Plan Note (Signed)
Chronic.  Controlled.  Continue with current medication regimen of Amlodipine, HCTZ and Valsartan daily.  Refills sent today.  Labs ordered today.  Return to clinic in 6 months for reevaluation.  Call sooner if concerns arise.

## 2022-12-07 NOTE — Assessment & Plan Note (Signed)
Chronic. Controlled.  Last A1c was 6.0%.  Continue with Mounjaro 7.5mg , Metformin and Jardiance.  Eye exam up to date.  Follow up in 6 months.  Call sooner if concerns arise.

## 2022-12-07 NOTE — Assessment & Plan Note (Signed)
Chronic.  Controlled.  Continue with current medication regimen of Rosuvastatin daily.  Labs ordered today.  Return to clinic in 6 months for reevaluation.  Call sooner if concerns arise.   

## 2022-12-08 ENCOUNTER — Ambulatory Visit: Payer: BLUE CROSS/BLUE SHIELD

## 2022-12-08 ENCOUNTER — Encounter: Payer: Self-pay | Admitting: Physical Therapy

## 2022-12-08 DIAGNOSIS — M25652 Stiffness of left hip, not elsewhere classified: Secondary | ICD-10-CM

## 2022-12-08 DIAGNOSIS — M6281 Muscle weakness (generalized): Secondary | ICD-10-CM

## 2022-12-08 DIAGNOSIS — M25552 Pain in left hip: Secondary | ICD-10-CM

## 2022-12-08 DIAGNOSIS — R269 Unspecified abnormalities of gait and mobility: Secondary | ICD-10-CM

## 2022-12-08 DIAGNOSIS — R2689 Other abnormalities of gait and mobility: Secondary | ICD-10-CM

## 2022-12-08 LAB — LIPID PANEL
Chol/HDL Ratio: 3.9 ratio (ref 0.0–4.4)
Cholesterol, Total: 187 mg/dL (ref 100–199)
HDL: 48 mg/dL (ref >39)
LDL Chol Calc (NIH): 126 mg/dL — ABNORMAL HIGH (ref 0–99)
Triglycerides: 69 mg/dL (ref 0–149)
VLDL Cholesterol Cal: 13 mg/dL (ref 5–40)

## 2022-12-08 LAB — COMPREHENSIVE METABOLIC PANEL
ALT: 13 IU/L (ref 0–32)
AST: 18 IU/L (ref 0–40)
Albumin: 4 g/dL (ref 3.8–4.9)
Alkaline Phosphatase: 74 IU/L (ref 44–121)
BUN/Creatinine Ratio: 21 (ref 9–23)
BUN: 17 mg/dL (ref 6–24)
Bilirubin Total: 0.4 mg/dL (ref 0.0–1.2)
CO2: 26 mmol/L (ref 20–29)
Calcium: 9.4 mg/dL (ref 8.7–10.2)
Chloride: 103 mmol/L (ref 96–106)
Creatinine, Ser: 0.8 mg/dL (ref 0.57–1.00)
Globulin, Total: 3.1 g/dL (ref 1.5–4.5)
Glucose: 96 mg/dL (ref 70–99)
Potassium: 4.4 mmol/L (ref 3.5–5.2)
Sodium: 142 mmol/L (ref 134–144)
Total Protein: 7.1 g/dL (ref 6.0–8.5)
eGFR: 87 mL/min/{1.73_m2} (ref >59)

## 2022-12-08 LAB — HEMOGLOBIN A1C
Est. average glucose Bld gHb Est-mCnc: 128 mg/dL
Hgb A1c MFr Bld: 6.1 % — ABNORMAL HIGH (ref 4.8–5.6)

## 2022-12-08 NOTE — Therapy (Signed)
OUTPATIENT PHYSICAL THERAPY THORACOLUMBAR TREATMENT/RE-CERTIFICATION  Patient Name: Yesenia Stevens MRN: [id number] DOB:02/25/1966, 57 y.o., female Today's Date: 12/08/2022   PT End of Session - 12/08/22 1118     Visit Number 56    Number of Visits 59    Date for PT Re-Evaluation 12/13/22    PT Start Time 1118    PT Stop Time 1200    PT Time Calculation (min) 42 min    Activity Tolerance Patient tolerated treatment well;Patient limited by pain    Behavior During Therapy [facility] for tasks assessed/performed             Past Medical History:  Diagnosis Date   Diabetes mellitus without complication (HCC)    Hypertension    Past Surgical History:  Procedure Laterality Date   CESAREAN SECTION     CHOLECYSTECTOMY     COLONOSCOPY WITH PROPOFOL N/A 12/10/2019   Procedure: COLONOSCOPY WITH PROPOFOL;  Surgeon: Wyline Mood, MD;  Location: [facility] ENDOSCOPY;  Service: Gastroenterology;  Laterality: N/A;   COLONOSCOPY WITH PROPOFOL N/A 12/24/2019   Procedure: COLONOSCOPY WITH PROPOFOL;  Surgeon: Wyline Mood, MD;  Location: [facility] ENDOSCOPY;  Service: Gastroenterology;  Laterality: N/A;   FRACTURE SURGERY     Patient Active Problem List   Diagnosis Date Noted   Acute streptococcal pharyngitis 10/04/2022   Hyperlipidemia associated with type 2 diabetes mellitus (HCC) 09/22/2021   Displaced fracture of base of neck of right femur, sequela 07/16/2021   Closed fracture of posterior wall of left acetabulum with routine healing 06/16/2021   Palpitations 03/19/2020   Diarrhea 03/19/2020   Premature atrial contractions 08/20/2018   Morbid (severe) obesity due to excess calories (HCC) 08/01/2018   Shortness of breath 07/26/2018   Chronic cough 03/22/2018   Uncomplicated type 2 diabetes mellitus (HCC) 03/22/2018   Hypertension associated with diabetes (HCC) 03/22/2018    PCP: Larae Grooms, NP   REFERRING PROVIDER: Ernst Bowler, MD  REFERRING DIAG:  Diagnosis  S32.15XD  (ICD-10-CM) - Type 2 fracture of sacrum, subsequent encounter for fracture with routine healing  S32.422D (ICD-10-CM) - Displaced fracture of posterior wall of left acetabulum, subsequent encounter for fracture with routine healing    Rationale for Evaluation and Treatment Rehabilitation  THERAPY DIAG:  Muscle weakness (generalized)  Gait difficulty  Balance problems  Stiffness of left hip joint  Pain in left hip  ONSET DATE: 05/28/21  SUBJECTIVE:                                                                                                                                                                                            PERTINENT HISTORY:  Hx of L hip dislocation, and screws placed in lower back after her injury. Pt left hospital in a [place], and utilized a WC originally during [facility]. Pt has decreased ankle DF in L ankle, and pain in her lateral foot / 5th metatarsal. Pt had L knee scope, and has tenderness/bruising in L LE along tibia.  PAIN:  Are you having pain? NPRS scale: 4-5/10 Pain location: L hip (anterior) Pain description: popping and dull/achy Aggravating factors: standing, bending forward (especially left) Relieving factors: sitting down, tylenol PRN   PRECAUTIONS: Anterior hip, Posterior hip, Knee, and Fall  WEIGHT BEARING RESTRICTIONS no  FALLS:  Has patient fallen in last 6 months Yes. Number of falls 1  LIVING ENVIRONMENT: Lives with: lives with their family Lives in: House/apartment Stairs: yes - 2 to get into home with raining Has following equipment at home: Quad cane large base and Wheelchair (manual)  OCCUPATION:  Pt is currently on long term disability. Previously worked at [employer] prior to injury. Would like to get back to work if the company can find a position that is more stationary.  PLOF: Independent  PATIENT GOALS Pt wants to alleviate pain, become more mobile, strengthen back musculature, strengthen LEs  OBJECTIVE:    DIAGNOSTIC FINDINGS:  LUMBAR SPINE - 2-3 VIEW   COMPARISON:  MRI 09/28/2009   FINDINGS: 4-5 mm retrolisthesis L5-S1, slightly progressive since prior examination, possibly degenerative in nature. Otherwise normal lumbar lordosis. No acute fracture of the lumbar spine. Vertebral body height is preserved. Intervertebral disc space narrowing and endplate remodeling at L4-5 and L5-S1 is in keeping with mild degenerative disc disease at these levels. Paraspinal soft tissues are unremarkable. Right sacroiliac arthrodesis has been performed with 2 partially threaded screws.   IMPRESSION: No acute fracture or traumatic listhesis.   Degenerative changes L4-S1 with 5 mm retrolisthesis L5-S1.     Electronically Signed   By: Helyn Numbers M.D.   On: 09/17/2021 22:13  PATIENT SURVEYS:  FOTO 52 ; 61  SCREENING FOR RED FLAGS: Bowel or bladder incontinence: No Spinal tumors: No Cauda equina syndrome: No Compression fracture: No Abdominal aneurysm: No  COGNITION:  Overall cognitive status: Within functional limits for tasks assessed     SENSATION: Not tested - patient reports altered sensation in L LE/foot  POSTURE: rounded shoulders, forward head, and weight shift left  PALPATION: Moderate L medial lower leg tenderness with palpation  LUMBAR ROM:   Active  AROM  eval  Flexion 50% limited (compensation to R)  Extension 75% limited (pain)  Right lateral flexion   Left lateral flexion   Right rotation 50% limited  Left rotation 25% limited   (Blank rows = not tested)  LOWER EXTREMITY ROM:     PROM/AROM Right eval Left eval  Hip flexion WFL 119/ 90 deg.  Hip extension    Hip abduction [facility]  Hip adduction    Hip internal rotation    Hip external rotation    Knee flexion WFL 106/ 99 deg.  Knee extension    Ankle dorsiflexion WFL Limited by pain  Ankle plantarflexion WFL Limited by pain  Ankle inversion    Ankle eversion     (Blank rows = not  tested)  LOWER EXTREMITY MMT:    MMT Right eval Left eval  Hip flexion 4/5 3/5  Hip extension    Hip abduction 4/5 3/5  Hip adduction 4/5 4/5  Hip internal rotation 5/5 unable  Hip external rotation 5/5 3/5  Knee flexion  5/5 3/5 (pain)  Knee extension 5/5 4/5  Ankle dorsiflexion 4+/5 3/5  Ankle plantarflexion 5/5 4/5  Ankle inversion    Ankle eversion     (Blank rows = not tested)  FUNCTIONAL TESTS:  5 times sit to stand: 25.4 seconds .  9/21:  14.06 seconds (marked improvement).    GAIT: Distance walked: in clinic Assistive device utilized: Quad cane small base Level of assistance: Modified independence Comments: R sided Trendelenburg.  L antalgic gait pattern with decrease stance on L with short R LE step length/ shuffling.  No arm swing. Walks with wide base of support, with mild supination of B feet. Pt had limited endurance, and significantly limited L hip flexion and DF to clear her foot during swing phase.     01/25/22: 5xSTS: 15.94 sec./ 14.94 sec.  (Marked improvement).      9/21:  5xSTS: 14.06 seconds (marked improvement).      11/8: 5xSTS: 12.48 seconds  (improvement).      06/24/22: 9.89 seconds (marked improvement).    2/28: L/R LE strength testing: hip flexion 3-/4, quad 4+/4+, R hip abduction 4-/3+.  L knee flexion 119 deg.   Reassessment of LE muscle strength: R hip/knee strength grossly 5/5 MMT except hip abduction 4+/5.  L hip flexion 3+/5 MMT, hip abduction 4-/5, adduction 4/5, extension 4/5 MMT.     TREATMENT:  12/08/2022  Subjective: Patient reports feeling good this morning but pain in L hip 3.5/10. Reports she feels as though she is improving with the help of physical therapy.   Treatment:     Physical therapy treatment session today consisted of completing assessment of goals and administration of testing as demonstrated and documented in flow sheet, treatment, and goals section of this note. Addition treatments may be found below.    There.ex.:       Nustep B UE/LE for 6 min at L4.  Discussed MD visit/ hip pain.     5xSTS: 19.53 seconds   6 minute walk test: 22' with 4/10 pain in L hip     Lateral walking in //-bars 3 laps each.  Min. UE assist and progressing to no UE assist.  Consistent BOS and placement with LLE throughout.  Maintaining L foot in midline.    Seated LAQ's 20x each LE.   Seated hip adduction ball squeeze x 20    Reviewed HEP  PATIENT EDUCATION:  Education details: Access Code: [phone] [phone] Person educated: Patient Education method: Explanation, Demonstration, and Handouts Education comprehension: verbalized understanding and returned demonstration   HOME EXERCISE PROGRAM: Access Code: [email or web address] URL: [email or web address] Date: 11/25/2021 Prepared by: Dorene Grebe  Exercises - Straight Leg Raise  - 1 x daily - 7 x weekly - 2 sets - 10 reps - Supine Heel Slide  - 1 x daily - 7 x weekly - 2 sets - 10 reps - Supine Hip Adduction Isometric with Ball  - 1 x daily - 7 x weekly - 2 sets - 10 reps - Hooklying Gluteal Sets  - 1 x daily - 7 x weekly - 2 sets - 10 reps - Seated Heel Raise  - 1 x daily - 7 x weekly - 2 sets - 10 reps - Seated Heel Toe Raises  - 1 x daily - 7 x weekly - 2 sets - 10 reps  Access Code: [email or web address] URL: [email or web address] Date: 06/27/2022 Prepared by: Dorene Grebe  Exercises - Straight Leg Raise  - 1 x daily - 7 x weekly - 2 sets -  10 reps - Supine Hip Adduction Isometric with Ball  - 1 x daily - 7 x weekly - 2 sets - 10 reps - Hooklying Gluteal Sets  - 1 x daily - 7 x weekly - 2 sets - 10 reps - Standing Hip Abduction  - 1 x daily - 7 x weekly - 2 sets - 12 reps - Mini Squat with Counter Support  - 1 x daily - 7 x weekly - 2 sets - 12 reps  ASSESSMENT:  CLINICAL IMPRESSION:  Patient showing progress towards goals despite increased pain in L hip this date. Additional goals added including 5xSTS and this date with patient scoring below age norm  placing patient at increased fall risk. Patient easily fatigued with ambulation and exercises utilizing large muscle groups. Encouraged patient to continue participating in HEP to continue improving L hip strength and overall mobility. Patient will continue to benefit from skilled therapy to address remaining deficits in order to improve quality of life and return to PLOF.    OBJECTIVE IMPAIRMENTS Abnormal gait, decreased activity tolerance, decreased balance, decreased endurance, decreased mobility, difficulty walking, decreased ROM, decreased strength, hypomobility, impaired flexibility, impaired sensation, improper body mechanics, postural dysfunction, obesity, and pain.   ACTIVITY LIMITATIONS carrying, lifting, bending, sitting, standing, squatting, sleeping, stairs, transfers, bed mobility, dressing, and locomotion level  PARTICIPATION LIMITATIONS: cleaning, laundry, driving, shopping, community activity, occupation, and yard work  PERSONAL FACTORS Age, Education, and Past/current experiences are also affecting patient's functional outcome.   REHAB POTENTIAL: Good  CLINICAL DECISION MAKING: Evolving/moderate complexity  EVALUATION COMPLEXITY: Moderate   GOALS: Goals reviewed with patient Yes  LONG TERM GOALS: Target date: 12/13/22  Pt will increase FOTO score to > 58 Baseline: 9/7: 52 (no significant changes).  11/8: 49 (slight regression secondary to increase L anterior hip pain with stair climbing/ increase distance walked.  1/16: 46 (continued regression/ pt. Considering THA).  4/30: 52; 6/20: 52  Goal status: Not met  2.  Pt will decrease pain level to < 2/10 during all ADLs, in order to efficiently accomplish activities with decreased pain and increased independence Baseline: see above; 6/20: reports 3.5/10 this session  Goal status: Partially met  3.  Pt will increase Active L knee flexion ROM to > 120 degrees. Baseline: 99, 106 passive.  8/8: 118 deg.  10/25: 118 deg. 2/28:  119 deg. 6/20: 109 deg Goal status: Partially met  4.  Pt. Will increase B LE muscle strength 1/2 muscle grade to improve stepping up into drivers side of car.    Baseline: see above; 6/20: L hip flexion 3-/5; L knee ext/flex: 4-  Goal status: IN PROGRESS  5.  Patient (< 62 years old) will complete five times sit to stand test in < 10 seconds indicating an increased LE strength and improved balance.  Baseline: 6/20: 19.53 sec  Goal status: INITIAL 6. Patient will increase six minute walk test distance to >800' for progression to community ambulator and improve gait ability with reports of L hip pain <2/10  Baseline: 6/20: 577' with 4/10 pain in L hip   Goal status: INITIAL     PLAN: PT FREQUENCY: 1x/week  PT DURATION: 8 weeks  PLANNED INTERVENTIONS: Therapeutic exercises, Therapeutic activity, Neuromuscular re-education, Balance training, Gait training, Patient/Family education, Joint mobilization, Stair training, Aquatic Therapy, Cryotherapy, Moist heat, and Manual therapy.  PLAN FOR NEXT SESSION:  Standing dynamic ex., functional activity conditioning (walking, steps).  CHECK SCHEDULE  Maylon Peppers, PT, DPT Physical Therapist - [facility]   11:48 AM,12/08/22

## 2022-12-08 NOTE — Progress Notes (Signed)
HI Ms. Yesenia Stevens. It was nice to see you yesterday.  Your lab work looks good.  Kidney function looks good.  Your LDL or bad cholesterol is elevated from prior.  I recommend a low fat diet.  A1c remains well controlled at 6.1%.  No concerns at this time. Continue with your current medication regimen.  Follow up as discussed.  Please let me know if you have any questions.

## 2022-12-29 ENCOUNTER — Encounter: Payer: Self-pay | Admitting: Physical Therapy

## 2022-12-29 ENCOUNTER — Ambulatory Visit: Payer: BLUE CROSS/BLUE SHIELD | Attending: Trauma Surgery

## 2022-12-29 DIAGNOSIS — R2689 Other abnormalities of gait and mobility: Secondary | ICD-10-CM | POA: Diagnosis present

## 2022-12-29 DIAGNOSIS — R269 Unspecified abnormalities of gait and mobility: Secondary | ICD-10-CM | POA: Insufficient documentation

## 2022-12-29 DIAGNOSIS — M6281 Muscle weakness (generalized): Secondary | ICD-10-CM | POA: Diagnosis present

## 2022-12-29 DIAGNOSIS — M25652 Stiffness of left hip, not elsewhere classified: Secondary | ICD-10-CM | POA: Insufficient documentation

## 2022-12-29 DIAGNOSIS — M5459 Other low back pain: Secondary | ICD-10-CM | POA: Diagnosis present

## 2022-12-29 DIAGNOSIS — M25552 Pain in left hip: Secondary | ICD-10-CM | POA: Diagnosis present

## 2022-12-29 NOTE — Therapy (Signed)
OUTPATIENT PHYSICAL THERAPY THORACOLUMBAR TREATMENT  Patient Name: Yesenia Stevens MRN: [id number] DOB:06/16/1966, 57 y.o., female Today's Date: 12/29/2022   PT End of Session - 12/29/22 1311     Visit Number 57    Number of Visits 71    Date for PT Re-Evaluation 02/10/23    PT Start Time 1308    PT Stop Time 1344    PT Time Calculation (min) 36 min    Activity Tolerance Patient tolerated treatment well;Patient limited by pain    Behavior During Therapy [facility] for tasks assessed/performed              Past Medical History:  Diagnosis Date   Diabetes mellitus without complication (HCC)    Hypertension    Past Surgical History:  Procedure Laterality Date   CESAREAN SECTION     CHOLECYSTECTOMY     COLONOSCOPY WITH PROPOFOL N/A 12/10/2019   Procedure: COLONOSCOPY WITH PROPOFOL;  Surgeon: Wyline Mood, MD;  Location: [facility] ENDOSCOPY;  Service: Gastroenterology;  Laterality: N/A;   COLONOSCOPY WITH PROPOFOL N/A 12/24/2019   Procedure: COLONOSCOPY WITH PROPOFOL;  Surgeon: Wyline Mood, MD;  Location: [facility] ENDOSCOPY;  Service: Gastroenterology;  Laterality: N/A;   FRACTURE SURGERY     Patient Active Problem List   Diagnosis Date Noted   Acute streptococcal pharyngitis 10/04/2022   Hyperlipidemia associated with type 2 diabetes mellitus (HCC) 09/22/2021   Displaced fracture of base of neck of right femur, sequela 07/16/2021   Closed fracture of posterior wall of left acetabulum with routine healing 06/16/2021   Palpitations 03/19/2020   Diarrhea 03/19/2020   Premature atrial contractions 08/20/2018   Morbid (severe) obesity due to excess calories (HCC) 08/01/2018   Shortness of breath 07/26/2018   Chronic cough 03/22/2018   Uncomplicated type 2 diabetes mellitus (HCC) 03/22/2018   Hypertension associated with diabetes (HCC) 03/22/2018    PCP: Larae Grooms, NP   REFERRING PROVIDER: Ernst Bowler, MD  REFERRING DIAG:  Diagnosis  S32.15XD (ICD-10-CM) - Type 2  fracture of sacrum, subsequent encounter for fracture with routine healing  S32.422D (ICD-10-CM) - Displaced fracture of posterior wall of left acetabulum, subsequent encounter for fracture with routine healing    Rationale for Evaluation and Treatment Rehabilitation  THERAPY DIAG:  Muscle weakness (generalized)  Gait difficulty  Pain in left hip  Stiffness of left hip joint  Balance problems  ONSET DATE: 05/28/21  SUBJECTIVE:                                                                                                                                                                                            PERTINENT HISTORY:  Hx of L hip dislocation, and screws placed in lower back after her injury. Pt left hospital in a [place], and utilized a WC originally during [facility]. Pt has decreased ankle DF in L ankle, and pain in her lateral foot / 5th metatarsal. Pt had L knee scope, and has tenderness/bruising in L LE along tibia.  PAIN:  Are you having pain? NPRS scale: 4-5/10 Pain location: L hip (anterior) Pain description: popping and dull/achy Aggravating factors: standing, bending forward (especially left) Relieving factors: sitting down, tylenol PRN   PRECAUTIONS: Anterior hip, Posterior hip, Knee, and Fall  WEIGHT BEARING RESTRICTIONS no  FALLS:  Has patient fallen in last 6 months Yes. Number of falls 1  LIVING ENVIRONMENT: Lives with: lives with their family Lives in: House/apartment Stairs: yes - 2 to get into home with raining Has following equipment at home: Quad cane large base and Wheelchair (manual)  OCCUPATION:  Pt is currently on long term disability. Previously worked at [employer] prior to injury. Would like to get back to work if the company can find a position that is more stationary.  PLOF: Independent  PATIENT GOALS Pt wants to alleviate pain, become more mobile, strengthen back musculature, strengthen LEs  OBJECTIVE:   DIAGNOSTIC FINDINGS:   LUMBAR SPINE - 2-3 VIEW   COMPARISON:  MRI 09/28/2009   FINDINGS: 4-5 mm retrolisthesis L5-S1, slightly progressive since prior examination, possibly degenerative in nature. Otherwise normal lumbar lordosis. No acute fracture of the lumbar spine. Vertebral body height is preserved. Intervertebral disc space narrowing and endplate remodeling at L4-5 and L5-S1 is in keeping with mild degenerative disc disease at these levels. Paraspinal soft tissues are unremarkable. Right sacroiliac arthrodesis has been performed with 2 partially threaded screws.   IMPRESSION: No acute fracture or traumatic listhesis.   Degenerative changes L4-S1 with 5 mm retrolisthesis L5-S1.     Electronically Signed   By: Helyn Numbers M.D.   On: 09/17/2021 22:13  PATIENT SURVEYS:  FOTO 52 ; 61  SCREENING FOR RED FLAGS: Bowel or bladder incontinence: No Spinal tumors: No Cauda equina syndrome: No Compression fracture: No Abdominal aneurysm: No  COGNITION:  Overall cognitive status: Within functional limits for tasks assessed     SENSATION: Not tested - patient reports altered sensation in L LE/foot  POSTURE: rounded shoulders, forward head, and weight shift left  PALPATION: Moderate L medial lower leg tenderness with palpation  LUMBAR ROM:   Active  AROM  eval  Flexion 50% limited (compensation to R)  Extension 75% limited (pain)  Right lateral flexion   Left lateral flexion   Right rotation 50% limited  Left rotation 25% limited   (Blank rows = not tested)  LOWER EXTREMITY ROM:     PROM/AROM Right eval Left eval  Hip flexion WFL 119/ 90 deg.  Hip extension    Hip abduction [facility]  Hip adduction    Hip internal rotation    Hip external rotation    Knee flexion WFL 106/ 99 deg.  Knee extension    Ankle dorsiflexion WFL Limited by pain  Ankle plantarflexion WFL Limited by pain  Ankle inversion    Ankle eversion     (Blank rows = not tested)  LOWER EXTREMITY MMT:     MMT Right eval Left eval  Hip flexion 4/5 3/5  Hip extension    Hip abduction 4/5 3/5  Hip adduction 4/5 4/5  Hip internal rotation 5/5 unable  Hip external rotation 5/5 3/5  Knee flexion  5/5 3/5 (pain)  Knee extension 5/5 4/5  Ankle dorsiflexion 4+/5 3/5  Ankle plantarflexion 5/5 4/5  Ankle inversion    Ankle eversion     (Blank rows = not tested)  FUNCTIONAL TESTS:  5 times sit to stand: 25.4 seconds .  9/21:  14.06 seconds (marked improvement).    GAIT: Distance walked: in clinic Assistive device utilized: Quad cane small base Level of assistance: Modified independence Comments: R sided Trendelenburg.  L antalgic gait pattern with decrease stance on L with short R LE step length/ shuffling.  No arm swing. Walks with wide base of support, with mild supination of B feet. Pt had limited endurance, and significantly limited L hip flexion and DF to clear her foot during swing phase.     01/25/22: 5xSTS: 15.94 sec./ 14.94 sec.  (Marked improvement).      9/21:  5xSTS: 14.06 seconds (marked improvement).      11/8: 5xSTS: 12.48 seconds  (improvement).      06/24/22: 9.89 seconds (marked improvement).    2/28: L/R LE strength testing: hip flexion 3-/4, quad 4+/4+, R hip abduction 4-/3+.  L knee flexion 119 deg.   Reassessment of LE muscle strength: R hip/knee strength grossly 5/5 MMT except hip abduction 4+/5.  L hip flexion 3+/5 MMT, hip abduction 4-/5, adduction 4/5, extension 4/5 MMT.     TREATMENT:  12/29/2022  Subjective: Patient reports 3/10 pain in L hip. Recently returned from trip to [place].   Treatment:      There.ex.:      Nustep B UE/LE for 5 min at L4.  Discussed MD visit/ hip pain.      Lateral walking at agility ladder 3 laps each.  Min. UE assist and progressing to no UE assist.  Consistent BOS and placement with LLE throughout.  Maintaining L foot in midline.    Seated LAQ's 2# AW x 20 each LE.   Seated marching 2# AW x 20   Seated hip adduction ball  squeeze x 20    Sit to stand from gray chair 2 x 10 no UE support   Hurdle step overs forwards/backwards and R/L lateral steps: x5/direction with 6" hurdles. Relies on SUE support with cane    Seated heel/toe raises x 20   Seated hip abduction with manual resistance by PT x 15   PATIENT EDUCATION:  Education details: Access Code: [email or web address] Person educated: Patient Education method: Explanation, Demonstration, and Handouts Education comprehension: verbalized understanding and returned demonstration   HOME EXERCISE PROGRAM: Access Code: [email or web address] URL: [email or web address] Date: 11/25/2021 Prepared by: Dorene Grebe  Exercises - Straight Leg Raise  - 1 x daily - 7 x weekly - 2 sets - 10 reps - Supine Heel Slide  - 1 x daily - 7 x weekly - 2 sets - 10 reps - Supine Hip Adduction Isometric with Ball  - 1 x daily - 7 x weekly - 2 sets - 10 reps - Hooklying Gluteal Sets  - 1 x daily - 7 x weekly - 2 sets - 10 reps - Seated Heel Raise  - 1 x daily - 7 x weekly - 2 sets - 10 reps - Seated Heel Toe Raises  - 1 x daily - 7 x weekly - 2 sets - 10 reps  Access Code: [email or web address] URL: [email or web address] Date: 06/27/2022 Prepared by: Dorene Grebe  Exercises - Straight Leg Raise  - 1 x daily - 7 x weekly - 2 sets - 10 reps - Supine Hip Adduction  Isometric with Ball  - 1 x daily - 7 x weekly - 2 sets - 10 reps - Hooklying Gluteal Sets  - 1 x daily - 7 x weekly - 2 sets - 10 reps - Standing Hip Abduction  - 1 x daily - 7 x weekly - 2 sets - 12 reps - Mini Squat with Counter Support  - 1 x daily - 7 x weekly - 2 sets - 12 reps  ASSESSMENT:  CLINICAL IMPRESSION:  Patient showing progress towards goals despite increased pain in L hip this date. Session focused on LE strengthening and balance. Patient easily fatigued with ambulation and exercises utilizing large muscle groups. Encouraged patient to continue participating in HEP to continue improving L hip strength  and overall mobility. Patient will continue to benefit from skilled therapy to address remaining deficits in order to improve quality of life and return to PLOF.    OBJECTIVE IMPAIRMENTS Abnormal gait, decreased activity tolerance, decreased balance, decreased endurance, decreased mobility, difficulty walking, decreased ROM, decreased strength, hypomobility, impaired flexibility, impaired sensation, improper body mechanics, postural dysfunction, obesity, and pain.   ACTIVITY LIMITATIONS carrying, lifting, bending, sitting, standing, squatting, sleeping, stairs, transfers, bed mobility, dressing, and locomotion level  PARTICIPATION LIMITATIONS: cleaning, laundry, driving, shopping, community activity, occupation, and yard work  PERSONAL FACTORS Age, Education, and Past/current experiences are also affecting patient's functional outcome.   REHAB POTENTIAL: Good  CLINICAL DECISION MAKING: Evolving/moderate complexity  EVALUATION COMPLEXITY: Moderate   GOALS: Goals reviewed with patient Yes  LONG TERM GOALS: Target date: 12/13/22  Pt will increase FOTO score to > 58 Baseline: 9/7: 52 (no significant changes).  11/8: 49 (slight regression secondary to increase L anterior hip pain with stair climbing/ increase distance walked.  1/16: 46 (continued regression/ pt. Considering THA).  4/30: 52; 6/20: 52  Goal status: Not met  2.  Pt will decrease pain level to < 2/10 during all ADLs, in order to efficiently accomplish activities with decreased pain and increased independence Baseline: see above; 6/20: reports 3.5/10 this session  Goal status: Partially met  3.  Pt will increase Active L knee flexion ROM to > 120 degrees. Baseline: 99, 106 passive.  8/8: 118 deg.  10/25: 118 deg. 2/28: 119 deg. 6/20: 109 deg Goal status: Partially met  4.  Pt. Will increase B LE muscle strength 1/2 muscle grade to improve stepping up into drivers side of car.    Baseline: see above; 6/20: L hip flexion 3-/5;  L knee ext/flex: 4-  Goal status: IN PROGRESS  5.  Patient (< 72 years old) will complete five times sit to stand test in < 10 seconds indicating an increased LE strength and improved balance.  Baseline: 6/20: 19.53 sec  Goal status: INITIAL 6. Patient will increase six minute walk test distance to >800' for progression to community ambulator and improve gait ability with reports of L hip pain <2/10  Baseline: 6/20: 577' with 4/10 pain in L hip   Goal status: INITIAL     PLAN: PT FREQUENCY: 1x/week  PT DURATION: 8 weeks  PLANNED INTERVENTIONS: Therapeutic exercises, Therapeutic activity, Neuromuscular re-education, Balance training, Gait training, Patient/Family education, Joint mobilization, Stair training, Aquatic Therapy, Cryotherapy, Moist heat, and Manual therapy.  PLAN FOR NEXT SESSION:  Standing dynamic ex., functional activity conditioning (walking, steps).  CHECK SCHEDULE  Maylon Peppers, PT, DPT Physical Therapist - [place]  [facility]   1:45 PM,12/29/22

## 2023-01-04 ENCOUNTER — Ambulatory Visit: Payer: BLUE CROSS/BLUE SHIELD | Admitting: Physical Therapy

## 2023-01-12 ENCOUNTER — Encounter: Payer: Self-pay | Admitting: Physical Therapy

## 2023-01-12 ENCOUNTER — Ambulatory Visit: Payer: BLUE CROSS/BLUE SHIELD

## 2023-01-12 DIAGNOSIS — M6281 Muscle weakness (generalized): Secondary | ICD-10-CM | POA: Diagnosis not present

## 2023-01-12 DIAGNOSIS — M25652 Stiffness of left hip, not elsewhere classified: Secondary | ICD-10-CM

## 2023-01-12 DIAGNOSIS — R2689 Other abnormalities of gait and mobility: Secondary | ICD-10-CM

## 2023-01-12 DIAGNOSIS — R269 Unspecified abnormalities of gait and mobility: Secondary | ICD-10-CM

## 2023-01-12 DIAGNOSIS — M5459 Other low back pain: Secondary | ICD-10-CM

## 2023-01-12 NOTE — Therapy (Signed)
OUTPATIENT PHYSICAL THERAPY THORACOLUMBAR TREATMENT  Patient Name: Yesenia Stevens MRN: [id number] DOB:May 02, 1966, 57 y.o., female Today's Date: 01/12/2023   PT End of Session - 01/12/23 1030     Visit Number 58    Number of Visits 71    Date for PT Re-Evaluation 02/10/23    PT Start Time 1030    PT Stop Time 1110    PT Time Calculation (min) 40 min    Activity Tolerance Patient tolerated treatment well;Patient limited by pain    Behavior During Therapy [facility] for tasks assessed/performed               Past Medical History:  Diagnosis Date   Diabetes mellitus without complication (HCC)    Hypertension    Past Surgical History:  Procedure Laterality Date   CESAREAN SECTION     CHOLECYSTECTOMY     COLONOSCOPY WITH PROPOFOL N/A 12/10/2019   Procedure: COLONOSCOPY WITH PROPOFOL;  Surgeon: Wyline Mood, MD;  Location: [facility] ENDOSCOPY;  Service: Gastroenterology;  Laterality: N/A;   COLONOSCOPY WITH PROPOFOL N/A 12/24/2019   Procedure: COLONOSCOPY WITH PROPOFOL;  Surgeon: Wyline Mood, MD;  Location: [facility] ENDOSCOPY;  Service: Gastroenterology;  Laterality: N/A;   FRACTURE SURGERY     Patient Active Problem List   Diagnosis Date Noted   Acute streptococcal pharyngitis 10/04/2022   Hyperlipidemia associated with type 2 diabetes mellitus (HCC) 09/22/2021   Displaced fracture of base of neck of right femur, sequela 07/16/2021   Closed fracture of posterior wall of left acetabulum with routine healing 06/16/2021   Palpitations 03/19/2020   Diarrhea 03/19/2020   Premature atrial contractions 08/20/2018   Morbid (severe) obesity due to excess calories (HCC) 08/01/2018   Shortness of breath 07/26/2018   Chronic cough 03/22/2018   Uncomplicated type 2 diabetes mellitus (HCC) 03/22/2018   Hypertension associated with diabetes (HCC) 03/22/2018    PCP: Larae Grooms, NP   REFERRING PROVIDER: Ernst Bowler, MD  REFERRING DIAG:  Diagnosis  S32.15XD (ICD-10-CM) - Type 2  fracture of sacrum, subsequent encounter for fracture with routine healing  S32.422D (ICD-10-CM) - Displaced fracture of posterior wall of left acetabulum, subsequent encounter for fracture with routine healing    Rationale for Evaluation and Treatment Rehabilitation  THERAPY DIAG:  Muscle weakness (generalized)  Gait difficulty  Balance problems  Other low back pain  Stiffness of left hip joint  ONSET DATE: 05/28/21  SUBJECTIVE:                                                                                                                                                                                            PERTINENT  HISTORY:  Hx of L hip dislocation, and screws placed in lower back after her injury. Pt left hospital in a [place], and utilized a WC originally during [facility]. Pt has decreased ankle DF in L ankle, and pain in her lateral foot / 5th metatarsal. Pt had L knee scope, and has tenderness/bruising in L LE along tibia.  PAIN:  Are you having pain? NPRS scale: 4-5/10 Pain location: L hip (anterior) Pain description: popping and dull/achy Aggravating factors: standing, bending forward (especially left) Relieving factors: sitting down, tylenol PRN   PRECAUTIONS: Anterior hip, Posterior hip, Knee, and Fall  WEIGHT BEARING RESTRICTIONS no  FALLS:  Has patient fallen in last 6 months Yes. Number of falls 1  LIVING ENVIRONMENT: Lives with: lives with their family Lives in: House/apartment Stairs: yes - 2 to get into home with raining Has following equipment at home: Quad cane large base and Wheelchair (manual)  OCCUPATION:  Pt is currently on long term disability. Previously worked at [employer] prior to injury. Would like to get back to work if the company can find a position that is more stationary.  PLOF: Independent  PATIENT GOALS Pt wants to alleviate pain, become more mobile, strengthen back musculature, strengthen LEs  OBJECTIVE:   DIAGNOSTIC  FINDINGS:  LUMBAR SPINE - 2-3 VIEW   COMPARISON:  MRI 09/28/2009   FINDINGS: 4-5 mm retrolisthesis L5-S1, slightly progressive since prior examination, possibly degenerative in nature. Otherwise normal lumbar lordosis. No acute fracture of the lumbar spine. Vertebral body height is preserved. Intervertebral disc space narrowing and endplate remodeling at L4-5 and L5-S1 is in keeping with mild degenerative disc disease at these levels. Paraspinal soft tissues are unremarkable. Right sacroiliac arthrodesis has been performed with 2 partially threaded screws.   IMPRESSION: No acute fracture or traumatic listhesis.   Degenerative changes L4-S1 with 5 mm retrolisthesis L5-S1.     Electronically Signed   By: Helyn Numbers M.D.   On: 09/17/2021 22:13  PATIENT SURVEYS:  FOTO 52 ; 61  SCREENING FOR RED FLAGS: Bowel or bladder incontinence: No Spinal tumors: No Cauda equina syndrome: No Compression fracture: No Abdominal aneurysm: No  COGNITION:  Overall cognitive status: Within functional limits for tasks assessed     SENSATION: Not tested - patient reports altered sensation in L LE/foot  POSTURE: rounded shoulders, forward head, and weight shift left  PALPATION: Moderate L medial lower leg tenderness with palpation  LUMBAR ROM:   Active  AROM  eval  Flexion 50% limited (compensation to R)  Extension 75% limited (pain)  Right lateral flexion   Left lateral flexion   Right rotation 50% limited  Left rotation 25% limited   (Blank rows = not tested)  LOWER EXTREMITY ROM:     PROM/AROM Right eval Left eval  Hip flexion WFL 119/ 90 deg.  Hip extension    Hip abduction [facility]  Hip adduction    Hip internal rotation    Hip external rotation    Knee flexion WFL 106/ 99 deg.  Knee extension    Ankle dorsiflexion WFL Limited by pain  Ankle plantarflexion WFL Limited by pain  Ankle inversion    Ankle eversion     (Blank rows = not tested)  LOWER EXTREMITY  MMT:    MMT Right eval Left eval  Hip flexion 4/5 3/5  Hip extension    Hip abduction 4/5 3/5  Hip adduction 4/5 4/5  Hip internal rotation 5/5 unable  Hip external rotation 5/5 3/5  Knee flexion 5/5 3/5 (pain)  Knee extension 5/5 4/5  Ankle dorsiflexion 4+/5 3/5  Ankle plantarflexion 5/5 4/5  Ankle inversion    Ankle eversion     (Blank rows = not tested)  FUNCTIONAL TESTS:  5 times sit to stand: 25.4 seconds .  9/21:  14.06 seconds (marked improvement).    GAIT: Distance walked: in clinic Assistive device utilized: Quad cane small base Level of assistance: Modified independence Comments: R sided Trendelenburg.  L antalgic gait pattern with decrease stance on L with short R LE step length/ shuffling.  No arm swing. Walks with wide base of support, with mild supination of B feet. Pt had limited endurance, and significantly limited L hip flexion and DF to clear her foot during swing phase.     01/25/22: 5xSTS: 15.94 sec./ 14.94 sec.  (Marked improvement).      9/21:  5xSTS: 14.06 seconds (marked improvement).      11/8: 5xSTS: 12.48 seconds  (improvement).      06/24/22: 9.89 seconds (marked improvement).    2/28: L/R LE strength testing: hip flexion 3-/4, quad 4+/4+, R hip abduction 4-/3+.  L knee flexion 119 deg.   Reassessment of LE muscle strength: R hip/knee strength grossly 5/5 MMT except hip abduction 4+/5.  L hip flexion 3+/5 MMT, hip abduction 4-/5, adduction 4/5, extension 4/5 MMT.     TREATMENT:  01/12/2023  Subjective:  Patient reports 4/10 pain in L hip. Going to visit new church in [place] on Sunday.   Treatment:      There.ex.:      Nustep B UE/LE for 10 min at L4.  Discussed HEP and participation.      Lateral walking at agility ladder 3 laps each.  Min. UE assist and progressing to no UE assist.  Consistent BOS and placement with LLE throughout.  Maintaining L foot in midline.    Seated LAQ's 2# AW x 20 each LE.   Seated heel/toe raises 2# AW x 20    Seated marching 2# AW x 20   Seated hip adduction ball squeeze x 20   Seated hip abduction with manual resistance by PT x 15    Sit to stand from gray chair 2 x 10 no UE support   Standing heel/toe raises x 20 each LE with B UE support    Standing hip abduction x 20 each LE with B UE support  PATIENT EDUCATION:  Education details: Access Code: [email or web address] Person educated: Patient Education method: Explanation, Demonstration, and Handouts Education comprehension: verbalized understanding and returned demonstration   HOME EXERCISE PROGRAM: Access Code: [email or web address] URL: [email or web address] Date: 11/25/2021 Prepared by: Dorene Grebe  Exercises - Straight Leg Raise  - 1 x daily - 7 x weekly - 2 sets - 10 reps - Supine Heel Slide  - 1 x daily - 7 x weekly - 2 sets - 10 reps - Supine Hip Adduction Isometric with Ball  - 1 x daily - 7 x weekly - 2 sets - 10 reps - Hooklying Gluteal Sets  - 1 x daily - 7 x weekly - 2 sets - 10 reps - Seated Heel Raise  - 1 x daily - 7 x weekly - 2 sets - 10 reps - Seated Heel Toe Raises  - 1 x daily - 7 x weekly - 2 sets - 10 reps  Access Code: [email or web address] URL: [email or web address] Date: 06/27/2022 Prepared by: Dorene Grebe  Exercises - Straight Leg Raise  - 1 x daily - 7  x weekly - 2 sets - 10 reps - Supine Hip Adduction Isometric with Ball  - 1 x daily - 7 x weekly - 2 sets - 10 reps - Hooklying Gluteal Sets  - 1 x daily - 7 x weekly - 2 sets - 10 reps - Standing Hip Abduction  - 1 x daily - 7 x weekly - 2 sets - 12 reps - Mini Squat with Counter Support  - 1 x daily - 7 x weekly - 2 sets - 12 reps  ASSESSMENT:  CLINICAL IMPRESSION:  Patient complaining of increased pain this session. Session focused on LE strengthening and balance. Patient easily fatigued with ambulation and exercises utilizing large muscle groups. Encouraged patient to continue participating in HEP to continue improving L hip strength and overall  mobility. Patient will continue to benefit from skilled therapy to address remaining deficits in order to improve quality of life and return to PLOF.    OBJECTIVE IMPAIRMENTS Abnormal gait, decreased activity tolerance, decreased balance, decreased endurance, decreased mobility, difficulty walking, decreased ROM, decreased strength, hypomobility, impaired flexibility, impaired sensation, improper body mechanics, postural dysfunction, obesity, and pain.   ACTIVITY LIMITATIONS carrying, lifting, bending, sitting, standing, squatting, sleeping, stairs, transfers, bed mobility, dressing, and locomotion level  PARTICIPATION LIMITATIONS: cleaning, laundry, driving, shopping, community activity, occupation, and yard work  PERSONAL FACTORS Age, Education, and Past/current experiences are also affecting patient's functional outcome.   REHAB POTENTIAL: Good  CLINICAL DECISION MAKING: Evolving/moderate complexity  EVALUATION COMPLEXITY: Moderate   GOALS: Goals reviewed with patient Yes  LONG TERM GOALS: Target date: 12/13/22  Pt will increase FOTO score to > 58 Baseline: 9/7: 52 (no significant changes).  11/8: 49 (slight regression secondary to increase L anterior hip pain with stair climbing/ increase distance walked.  1/16: 46 (continued regression/ pt. Considering THA).  4/30: 52; 6/20: 52  Goal status: Not met  2.  Pt will decrease pain level to < 2/10 during all ADLs, in order to efficiently accomplish activities with decreased pain and increased independence Baseline: see above; 6/20: reports 3.5/10 this session  Goal status: Partially met  3.  Pt will increase Active L knee flexion ROM to > 120 degrees. Baseline: 99, 106 passive.  8/8: 118 deg.  10/25: 118 deg. 2/28: 119 deg. 6/20: 109 deg Goal status: Partially met  4.  Pt. Will increase B LE muscle strength 1/2 muscle grade to improve stepping up into drivers side of car.    Baseline: see above; 6/20: L hip flexion 3-/5; L knee  ext/flex: 4-  Goal status: IN PROGRESS  5.  Patient (< 67 years old) will complete five times sit to stand test in < 10 seconds indicating an increased LE strength and improved balance.  Baseline: 6/20: 19.53 sec  Goal status: INITIAL 6. Patient will increase six minute walk test distance to >800' for progression to community ambulator and improve gait ability with reports of L hip pain <2/10  Baseline: 6/20: 577' with 4/10 pain in L hip   Goal status: INITIAL     PLAN: PT FREQUENCY: 1x/week  PT DURATION: 8 weeks  PLANNED INTERVENTIONS: Therapeutic exercises, Therapeutic activity, Neuromuscular re-education, Balance training, Gait training, Patient/Family education, Joint mobilization, Stair training, Aquatic Therapy, Cryotherapy, Moist heat, and Manual therapy.  PLAN FOR NEXT SESSION:  Standing dynamic ex., functional activity conditioning (walking, steps).    Maylon Peppers, PT, DPT Physical Therapist - [place]  [facility]   11:14 AM,01/12/23

## 2023-01-19 ENCOUNTER — Encounter: Payer: Self-pay | Admitting: Physical Therapy

## 2023-01-19 ENCOUNTER — Ambulatory Visit: Payer: BLUE CROSS/BLUE SHIELD | Attending: Trauma Surgery | Admitting: Physical Therapy

## 2023-01-19 DIAGNOSIS — R269 Unspecified abnormalities of gait and mobility: Secondary | ICD-10-CM | POA: Insufficient documentation

## 2023-01-19 DIAGNOSIS — R29898 Other symptoms and signs involving the musculoskeletal system: Secondary | ICD-10-CM | POA: Diagnosis not present

## 2023-01-19 DIAGNOSIS — M25652 Stiffness of left hip, not elsewhere classified: Secondary | ICD-10-CM | POA: Insufficient documentation

## 2023-01-19 DIAGNOSIS — M6281 Muscle weakness (generalized): Secondary | ICD-10-CM | POA: Diagnosis not present

## 2023-01-19 DIAGNOSIS — M5459 Other low back pain: Secondary | ICD-10-CM | POA: Diagnosis not present

## 2023-01-19 DIAGNOSIS — M25552 Pain in left hip: Secondary | ICD-10-CM | POA: Diagnosis not present

## 2023-01-19 DIAGNOSIS — R2689 Other abnormalities of gait and mobility: Secondary | ICD-10-CM | POA: Insufficient documentation

## 2023-01-19 NOTE — Therapy (Signed)
OUTPATIENT PHYSICAL THERAPY THORACOLUMBAR TREATMENT  Patient Name: Yesenia Stevens MRN: [id number] DOB:1965-12-21, 57 y.o., female Today's Date: 01/19/2023   PT End of Session - 01/19/23 1037     Visit Number 59    Number of Visits 71    Date for PT Re-Evaluation 02/10/23    PT Start Time 1031    PT Stop Time 1119    PT Time Calculation (min) 48 min    Activity Tolerance Patient tolerated treatment well;Patient limited by pain    Behavior During Therapy [facility] for tasks assessed/performed              Past Medical History:  Diagnosis Date   Diabetes mellitus without complication (HCC)    Hypertension    Past Surgical History:  Procedure Laterality Date   CESAREAN SECTION     CHOLECYSTECTOMY     COLONOSCOPY WITH PROPOFOL N/A 12/10/2019   Procedure: COLONOSCOPY WITH PROPOFOL;  Surgeon: Wyline Mood, MD;  Location: [facility] ENDOSCOPY;  Service: Gastroenterology;  Laterality: N/A;   COLONOSCOPY WITH PROPOFOL N/A 12/24/2019   Procedure: COLONOSCOPY WITH PROPOFOL;  Surgeon: Wyline Mood, MD;  Location: [facility] ENDOSCOPY;  Service: Gastroenterology;  Laterality: N/A;   FRACTURE SURGERY     Patient Active Problem List   Diagnosis Date Noted   Acute streptococcal pharyngitis 10/04/2022   Hyperlipidemia associated with type 2 diabetes mellitus (HCC) 09/22/2021   Displaced fracture of base of neck of right femur, sequela 07/16/2021   Closed fracture of posterior wall of left acetabulum with routine healing 06/16/2021   Palpitations 03/19/2020   Diarrhea 03/19/2020   Premature atrial contractions 08/20/2018   Morbid (severe) obesity due to excess calories (HCC) 08/01/2018   Shortness of breath 07/26/2018   Chronic cough 03/22/2018   Uncomplicated type 2 diabetes mellitus (HCC) 03/22/2018   Hypertension associated with diabetes (HCC) 03/22/2018    PCP: Larae Grooms, NP   REFERRING PROVIDER: Ernst Bowler, MD  REFERRING DIAG:  Diagnosis  S32.15XD (ICD-10-CM) - Type 2  fracture of sacrum, subsequent encounter for fracture with routine healing  S32.422D (ICD-10-CM) - Displaced fracture of posterior wall of left acetabulum, subsequent encounter for fracture with routine healing    Rationale for Evaluation and Treatment Rehabilitation  THERAPY DIAG:  Muscle weakness (generalized)  Gait difficulty  Balance problems  Other low back pain  Stiffness of left hip joint  Pain in left hip  ONSET DATE: 05/28/21  SUBJECTIVE:  PERTINENT HISTORY:  Hx of L hip dislocation, and screws placed in lower back after her injury. Pt left hospital in a walker, and utilized a WC originally during [facility]. Pt has decreased ankle DF in L ankle, and pain in her lateral foot / 5th metatarsal. Pt had L knee scope, and has tenderness/bruising in L LE along tibia.  PAIN:  Are you having pain? NPRS scale: 4-5/10 Pain location: L hip (anterior) Pain description: popping and dull/achy Aggravating factors: standing, bending forward (especially left) Relieving factors: sitting down, tylenol PRN   PRECAUTIONS: Anterior hip, Posterior hip, Knee, and Fall  WEIGHT BEARING RESTRICTIONS no  FALLS:  Has patient fallen in last 6 months Yes. Number of falls 1  LIVING ENVIRONMENT: Lives with: lives with their family Lives in: House/apartment Stairs: yes - 2 to get into home with raining Has following equipment at home: Quad cane large base and Wheelchair (manual)  OCCUPATION:  Pt is currently on long term disability. Previously worked at [employer] prior to injury. Would like to get back to work if the company can find a position that is more stationary.  PLOF: Independent  PATIENT GOALS Pt wants to alleviate pain, become more mobile, strengthen back musculature, strengthen LEs  OBJECTIVE:    DIAGNOSTIC FINDINGS:  LUMBAR SPINE - 2-3 VIEW   COMPARISON:  MRI 09/28/2009   FINDINGS: 4-5 mm retrolisthesis L5-S1, slightly progressive since prior examination, possibly degenerative in nature. Otherwise normal lumbar lordosis. No acute fracture of the lumbar spine. Vertebral body height is preserved. Intervertebral disc space narrowing and endplate remodeling at L4-5 and L5-S1 is in keeping with mild degenerative disc disease at these levels. Paraspinal soft tissues are unremarkable. Right sacroiliac arthrodesis has been performed with 2 partially threaded screws.   IMPRESSION: No acute fracture or traumatic listhesis.   Degenerative changes L4-S1 with 5 mm retrolisthesis L5-S1.     Electronically Signed   By: Helyn Numbers M.D.   On: 09/17/2021 22:13  PATIENT SURVEYS:  FOTO 52 ; 61  SCREENING FOR RED FLAGS: Bowel or bladder incontinence: No Spinal tumors: No Cauda equina syndrome: No Compression fracture: No Abdominal aneurysm: No  COGNITION:  Overall cognitive status: Within functional limits for tasks assessed     SENSATION: Not tested - patient reports altered sensation in L LE/foot  POSTURE: rounded shoulders, forward head, and weight shift left  PALPATION: Moderate L medial lower leg tenderness with palpation  LUMBAR ROM:   Active  AROM  eval  Flexion 50% limited (compensation to R)  Extension 75% limited (pain)  Right lateral flexion   Left lateral flexion   Right rotation 50% limited  Left rotation 25% limited   (Blank rows = not tested)  LOWER EXTREMITY ROM:     PROM/AROM Right eval Left eval  Hip flexion WFL 119/ 90 deg.  Hip extension    Hip abduction [facility]  Hip adduction    Hip internal rotation    Hip external rotation    Knee flexion WFL 106/ 99 deg.  Knee extension    Ankle dorsiflexion WFL Limited by pain  Ankle plantarflexion WFL Limited by pain  Ankle inversion    Ankle eversion     (Blank rows = not  tested)  LOWER EXTREMITY MMT:    MMT Right eval Left eval  Hip flexion 4/5 3/5  Hip extension    Hip abduction 4/5 3/5  Hip adduction 4/5 4/5  Hip internal rotation 5/5 unable  Hip external rotation 5/5 3/5  Knee flexion 5/5 3/5 (pain)  Knee extension 5/5 4/5  Ankle dorsiflexion 4+/5 3/5  Ankle plantarflexion 5/5 4/5  Ankle inversion    Ankle eversion     (Blank rows = not tested)  FUNCTIONAL TESTS:  5 times sit to stand: 25.4 seconds .  9/21:  14.06 seconds (marked improvement).    GAIT: Distance walked: in clinic Assistive device utilized: Quad cane small base Level of assistance: Modified independence Comments: R sided Trendelenburg.  L antalgic gait pattern with decrease stance on L with short R LE step length/ shuffling.  No arm swing. Walks with wide base of support, with mild supination of B feet. Pt had limited endurance, and significantly limited L hip flexion and DF to clear her foot during swing phase.     01/25/22: 5xSTS: 15.94 sec./ 14.94 sec.  (Marked improvement).      9/21:  5xSTS: 14.06 seconds (marked improvement).      11/8: 5xSTS: 12.48 seconds  (improvement).      06/24/22: 9.89 seconds (marked improvement).    2/28: L/R LE strength testing: hip flexion 3-/4, quad 4+/4+, R hip abduction 4-/3+.  L knee flexion 119 deg.   Reassessment of LE muscle strength: R hip/knee strength grossly 5/5 MMT except hip abduction 4+/5.  L hip flexion 3+/5 MMT, hip abduction 4-/5, adduction 4/5, extension 4/5 MMT.     TREATMENT:  01/19/2023  Subjective:  Pt. Reports no new complaints.  Pt. States she has not been going to gym.  Pt. Still trying to lose weight (40#) to qualify for THA.  Treatment:     There.ex.:    Nustep B UE/LE for 10 min at L4.  0.5 miles.  Discussed home exercises/ activity over weekend.    Forward/ lateral walking in //-bars (3 laps each).  Min. UE assist and progressing to no UE assist.  Consistent BOS and placement with LLE throughout.   Maintaining L foot in midline.  Added alt. UE/LE touches in //-bars (focus on L hip flexion).  Seated LAQ/ marching 20x each (no wt.)  Sit to stand from gray chair 5x with no UE support  Hurdle step overs forwards/lateral: 3 laps each in //-bars.   Walking partial lunges in //-bars 2 laps.   Standing heel/toe raises x 20   Amb. In gym/ hallway out to car with use of SPC and consistent 2-point gait pattern.  No LOB   PATIENT EDUCATION:  Education details: Access Code: [phone] Person educated: Patient Education method: Explanation, Demonstration, and Handouts Education comprehension: verbalized understanding and returned demonstration   HOME EXERCISE PROGRAM: Access Code: [email or web address] URL: [email or web address] Date: 11/25/2021 Prepared by: Dorene Grebe  Exercises - Straight Leg Raise  - 1 x daily - 7 x weekly - 2 sets - 10 reps - Supine Heel Slide  - 1 x daily - 7 x weekly - 2 sets - 10 reps - Supine Hip Adduction Isometric with Ball  - 1 x daily - 7 x weekly - 2 sets - 10 reps - Hooklying Gluteal Sets  - 1 x daily - 7 x weekly - 2 sets - 10 reps - Seated Heel Raise  - 1 x daily - 7 x weekly - 2 sets - 10 reps - Seated Heel Toe Raises  - 1 x daily - 7 x weekly - 2 sets - 10 reps  Access Code: [email or web address] URL: [email or web address] Date: 06/27/2022 Prepared by: Dorene Grebe  Exercises - Straight Leg Raise  - 1 x daily - 7 x  weekly - 2 sets - 10 reps - Supine Hip Adduction Isometric with Ball  - 1 x daily - 7 x weekly - 2 sets - 10 reps - Hooklying Gluteal Sets  - 1 x daily - 7 x weekly - 2 sets - 10 reps - Standing Hip Abduction  - 1 x daily - 7 x weekly - 2 sets - 12 reps - Mini Squat with Counter Support  - 1 x daily - 7 x weekly - 2 sets - 12 reps  ASSESSMENT:  CLINICAL IMPRESSION:  Patient showing progress towards goals with continued c/o L hip pain. Session focused on LE strengthening and balance. Patient easily fatigued with ambulation and  exercises utilizing large muscle groups. Encouraged patient to continue participating in HEP/ pool ex. program to continue improving L hip strength and overall mobility. Patient will continue to benefit from skilled therapy to address remaining deficits in order to improve quality of life and return to PLOF.    OBJECTIVE IMPAIRMENTS Abnormal gait, decreased activity tolerance, decreased balance, decreased endurance, decreased mobility, difficulty walking, decreased ROM, decreased strength, hypomobility, impaired flexibility, impaired sensation, improper body mechanics, postural dysfunction, obesity, and pain.   ACTIVITY LIMITATIONS carrying, lifting, bending, sitting, standing, squatting, sleeping, stairs, transfers, bed mobility, dressing, and locomotion level  PARTICIPATION LIMITATIONS: cleaning, laundry, driving, shopping, community activity, occupation, and yard work  PERSONAL FACTORS Age, Education, and Past/current experiences are also affecting patient's functional outcome.   REHAB POTENTIAL: Good  CLINICAL DECISION MAKING: Evolving/moderate complexity  EVALUATION COMPLEXITY: Moderate   GOALS: Goals reviewed with patient Yes  LONG TERM GOALS: Target date: 02/10/23  Pt will increase FOTO score to > 58 Baseline: 9/7: 52 (no significant changes).  11/8: 49 (slight regression secondary to increase L anterior hip pain with stair climbing/ increase distance walked.  1/16: 46 (continued regression/ pt. Considering THA).  4/30: 52; 6/20: 52  Goal status: Not met  2.  Pt will decrease pain level to < 2/10 during all ADLs, in order to efficiently accomplish activities with decreased pain and increased independence Baseline: see above; 6/20: reports 3.5/10 this session  Goal status: Partially met  3.  Pt will increase Active L knee flexion ROM to > 120 degrees. Baseline: 99, 106 passive.  8/8: 118 deg.  10/25: 118 deg. 2/28: 119 deg. 6/20: 109 deg Goal status: Partially met  4.  Pt. Will  increase B LE muscle strength 1/2 muscle grade to improve stepping up into drivers side of car.    Baseline: see above; 6/20: L hip flexion 3-/5; L knee ext/flex: 4-  Goal status: IN PROGRESS  5.  Patient (< 47 years old) will complete five times sit to stand test in < 10 seconds indicating an increased LE strength and improved balance.  Baseline: 6/20: 19.53 sec  Goal status: INITIAL 6. Patient will increase six minute walk test distance to >800' for progression to community ambulator and improve gait ability with reports of L hip pain <2/10  Baseline: 6/20: 577' with 4/10 pain in L hip   Goal status: INITIAL     PLAN: PT FREQUENCY: 1x/week  PT DURATION: 8 weeks  PLANNED INTERVENTIONS: Therapeutic exercises, Therapeutic activity, Neuromuscular re-education, Balance training, Gait training, Patient/Family education, Joint mobilization, Stair training, Aquatic Therapy, Cryotherapy, Moist heat, and Manual therapy.  PLAN FOR NEXT SESSION:  Standing dynamic ex., functional activity conditioning (walking, steps).    Cammie Mcgee, PT, DPT # [phone] Physical Therapist - [place]  [facility]  12:14 PM,01/19/23

## 2023-01-24 DIAGNOSIS — L2089 Other atopic dermatitis: Secondary | ICD-10-CM | POA: Diagnosis not present

## 2023-01-24 DIAGNOSIS — L668 Other cicatricial alopecia: Secondary | ICD-10-CM | POA: Diagnosis not present

## 2023-01-24 DIAGNOSIS — L218 Other seborrheic dermatitis: Secondary | ICD-10-CM | POA: Diagnosis not present

## 2023-01-25 ENCOUNTER — Telehealth: Payer: Self-pay | Admitting: Nurse Practitioner

## 2023-01-25 NOTE — Telephone Encounter (Signed)
Copied from CRM [phone]. Topic: General - Other >> Jan 25, 2023  4:15 PM Santiya F wrote: Reason for CRM: [employer] is calling in because they faxed over a request for office notes on the pt on 01/17/23 and they haven't heard anything back. They are going to resend the form. CASE #: [id number]

## 2023-01-26 ENCOUNTER — Ambulatory Visit: Payer: BLUE CROSS/BLUE SHIELD | Admitting: Physical Therapy

## 2023-01-26 NOTE — Telephone Encounter (Signed)
Noticed that requested medical records were for Long Term disability. [employer] has been notified  that our office does not complete long term disability forms.

## 2023-02-02 ENCOUNTER — Ambulatory Visit: Payer: BLUE CROSS/BLUE SHIELD | Admitting: Physical Therapy

## 2023-02-02 ENCOUNTER — Encounter: Payer: Self-pay | Admitting: Physical Therapy

## 2023-02-02 DIAGNOSIS — R2689 Other abnormalities of gait and mobility: Secondary | ICD-10-CM | POA: Diagnosis not present

## 2023-02-02 DIAGNOSIS — M25552 Pain in left hip: Secondary | ICD-10-CM | POA: Diagnosis not present

## 2023-02-02 DIAGNOSIS — M6281 Muscle weakness (generalized): Secondary | ICD-10-CM

## 2023-02-02 DIAGNOSIS — M5459 Other low back pain: Secondary | ICD-10-CM | POA: Diagnosis not present

## 2023-02-02 DIAGNOSIS — R269 Unspecified abnormalities of gait and mobility: Secondary | ICD-10-CM | POA: Diagnosis not present

## 2023-02-02 DIAGNOSIS — R29898 Other symptoms and signs involving the musculoskeletal system: Secondary | ICD-10-CM | POA: Diagnosis not present

## 2023-02-02 DIAGNOSIS — M25652 Stiffness of left hip, not elsewhere classified: Secondary | ICD-10-CM

## 2023-02-02 NOTE — Therapy (Signed)
OUTPATIENT PHYSICAL THERAPY THORACOLUMBAR TREATMENT Physical Therapy Progress Note  Dates of reporting period  10/18/22   to   02/02/23   Patient Name: Yesenia Stevens MRN: [id number] DOB:1966-06-14, 57 y.o., female Today's Date: 02/02/2023   PT End of Session - 02/02/23 1129     Visit Number 60    Number of Visits 71    Date for PT Re-Evaluation 02/10/23    PT Start Time 1035    PT Stop Time 1120    PT Time Calculation (min) 45 min    Activity Tolerance Patient limited by pain    Behavior During Therapy [facility] for tasks assessed/performed               Past Medical History:  Diagnosis Date   Diabetes mellitus without complication (HCC)    Hypertension    Past Surgical History:  Procedure Laterality Date   CESAREAN SECTION     CHOLECYSTECTOMY     COLONOSCOPY WITH PROPOFOL N/A 12/10/2019   Procedure: COLONOSCOPY WITH PROPOFOL;  Surgeon: Wyline Mood, MD;  Location: [facility] ENDOSCOPY;  Service: Gastroenterology;  Laterality: N/A;   COLONOSCOPY WITH PROPOFOL N/A 12/24/2019   Procedure: COLONOSCOPY WITH PROPOFOL;  Surgeon: Wyline Mood, MD;  Location: [facility] ENDOSCOPY;  Service: Gastroenterology;  Laterality: N/A;   FRACTURE SURGERY     Patient Active Problem List   Diagnosis Date Noted   Acute streptococcal pharyngitis 10/04/2022   Hyperlipidemia associated with type 2 diabetes mellitus (HCC) 09/22/2021   Displaced fracture of base of neck of right femur, sequela 07/16/2021   Closed fracture of posterior wall of left acetabulum with routine healing 06/16/2021   Palpitations 03/19/2020   Diarrhea 03/19/2020   Premature atrial contractions 08/20/2018   Morbid (severe) obesity due to excess calories (HCC) 08/01/2018   Shortness of breath 07/26/2018   Chronic cough 03/22/2018   Uncomplicated type 2 diabetes mellitus (HCC) 03/22/2018   Hypertension associated with diabetes (HCC) 03/22/2018    PCP: Larae Grooms, NP   REFERRING PROVIDER: Ernst Bowler,  MD  REFERRING DIAG:  Diagnosis  S32.15XD (ICD-10-CM) - Type 2 fracture of sacrum, subsequent encounter for fracture with routine healing  S32.422D (ICD-10-CM) - Displaced fracture of posterior wall of left acetabulum, subsequent encounter for fracture with routine healing    Rationale for Evaluation and Treatment Rehabilitation  THERAPY DIAG:  Muscle weakness (generalized)  Gait difficulty  Balance problems  Other low back pain  Stiffness of left hip joint  Pain in left hip  Impaired strength of hip muscles  Stiffness of left hip, not elsewhere classified  Balance problem  ONSET DATE: 05/28/21  SUBJECTIVE:  PERTINENT HISTORY:  Hx of L hip dislocation, and screws placed in lower back after her injury. Pt left hospital in a walker, and utilized a WC originally during [facility]. Pt has decreased ankle DF in L ankle, and pain in her lateral foot / 5th metatarsal. Pt had L knee scope, and has tenderness/bruising in L LE along tibia.  PAIN:  Are you having pain? NPRS scale: 4-5/10 Pain location: L hip (anterior) Pain description: popping and dull/achy Aggravating factors: standing, bending forward (especially left) Relieving factors: sitting down, tylenol PRN   PRECAUTIONS: Anterior hip, Posterior hip, Knee, and Fall  WEIGHT BEARING RESTRICTIONS no  FALLS:  Has patient fallen in last 6 months Yes. Number of falls 1  LIVING ENVIRONMENT: Lives with: lives with their family Lives in: House/apartment Stairs: yes - 2 to get into home with raining Has following equipment at home: Quad cane large base and Wheelchair (manual)  OCCUPATION:  Pt is currently on long term disability. Previously worked at [employer] prior to injury. Would like to get back to work if the company can find a position  that is more stationary.  PLOF: Independent  PATIENT GOALS Pt wants to alleviate pain, become more mobile, strengthen back musculature, strengthen LEs  OBJECTIVE:   DIAGNOSTIC FINDINGS:  LUMBAR SPINE - 2-3 VIEW   COMPARISON:  MRI 09/28/2009   FINDINGS: 4-5 mm retrolisthesis L5-S1, slightly progressive since prior examination, possibly degenerative in nature. Otherwise normal lumbar lordosis. No acute fracture of the lumbar spine. Vertebral body height is preserved. Intervertebral disc space narrowing and endplate remodeling at L4-5 and L5-S1 is in keeping with mild degenerative disc disease at these levels. Paraspinal soft tissues are unremarkable. Right sacroiliac arthrodesis has been performed with 2 partially threaded screws.   IMPRESSION: No acute fracture or traumatic listhesis.   Degenerative changes L4-S1 with 5 mm retrolisthesis L5-S1.     Electronically Signed   By: Helyn Numbers M.D.   On: 09/17/2021 22:13  PATIENT SURVEYS:  FOTO 52 ; 61  SCREENING FOR RED FLAGS: Bowel or bladder incontinence: No Spinal tumors: No Cauda equina syndrome: No Compression fracture: No Abdominal aneurysm: No  COGNITION:  Overall cognitive status: Within functional limits for tasks assessed     SENSATION: Not tested - patient reports altered sensation in L LE/foot  POSTURE: rounded shoulders, forward head, and weight shift left  PALPATION: Moderate L medial lower leg tenderness with palpation  LUMBAR ROM:   Active  AROM  eval  Flexion 50% limited (compensation to R)  Extension 75% limited (pain)  Right lateral flexion   Left lateral flexion   Right rotation 50% limited  Left rotation 25% limited   (Blank rows = not tested)  LOWER EXTREMITY ROM:     PROM/AROM Right eval Left eval  Hip flexion WFL 119/ 90 deg.  Hip extension    Hip abduction [facility]  Hip adduction    Hip internal rotation    Hip external rotation    Knee flexion WFL 106/ 99 deg.  Knee  extension    Ankle dorsiflexion WFL Limited by pain  Ankle plantarflexion WFL Limited by pain  Ankle inversion    Ankle eversion     (Blank rows = not tested)  LOWER EXTREMITY MMT:    MMT Right eval Left eval  Hip flexion 4/5 3/5  Hip extension    Hip abduction 4/5 3/5  Hip adduction 4/5 4/5  Hip internal rotation 5/5 unable  Hip external rotation 5/5 3/5  Knee flexion 5/5 3/5 (pain)  Knee extension 5/5 4/5  Ankle dorsiflexion 4+/5 3/5  Ankle plantarflexion 5/5 4/5  Ankle inversion    Ankle eversion     (Blank rows = not tested)  FUNCTIONAL TESTS:  5 times sit to stand: 25.4 seconds .  9/21:  14.06 seconds (marked improvement).    GAIT: Distance walked: in clinic Assistive device utilized: Quad cane small base Level of assistance: Modified independence Comments: R sided Trendelenburg.  L antalgic gait pattern with decrease stance on L with short R LE step length/ shuffling.  No arm swing. Walks with wide base of support, with mild supination of B feet. Pt had limited endurance, and significantly limited L hip flexion and DF to clear her foot during swing phase.     01/25/22: 5xSTS: 15.94 sec./ 14.94 sec.  (Marked improvement).      9/21:  5xSTS: 14.06 seconds (marked improvement).      11/8: 5xSTS: 12.48 seconds  (improvement).      06/24/22: 9.89 seconds (marked improvement).    2/28: L/R LE strength testing: hip flexion 3-/4, quad 4+/4+, R hip abduction 4-/3+.  L knee flexion 119 deg.   Reassessment of LE muscle strength: R hip/knee strength grossly 5/5 MMT except hip abduction 4+/5.  L hip flexion 3+/5 MMT, hip abduction 4-/5, adduction 4/5, extension 4/5 MMT.     TREATMENT:  02/02/2023  Subjective:  Pt reports 3/10 pain in her L hip. Says her HEP is still going well, hasn't been back to the pool yet.   Treatment:     There.ex.:    Nustep B UE/LE for 10 min at L5.  0.5 miles.  Discussed home exercises/ activity over past 2 weeks.  Forward/ lateral walking in  //-bars (3 laps each) with 3# AW:   Min. UE assist and progressing to no UE assist.    Seated LAQ/ marching 20x each: 3# AW with LAQ, no weight wifh marching - (4/10) L hip pain at end of seated LAQ   Sit to stand from gray chair 3x with no UE support  Hurdle step overs forwards/lateral: 2 laps each in //-bars.   Standing heel/toe raises x 20   Amb. In gym/ hallway out to car with use of SPC and consistent 2-point gait pattern.  No LOB   Not today: Walking partial lunges in //-bars 2 laps.   PATIENT EDUCATION:  Education details: Access Code: [id number] Person educated: Patient Education method: Explanation, Demonstration, and Handouts Education comprehension: verbalized understanding and returned demonstration   HOME EXERCISE PROGRAM: Access Code: [email or web address] URL: [email or web address] Date: 11/25/2021 Prepared by: Dorene Grebe  Exercises - Straight Leg Raise  - 1 x daily - 7 x weekly - 2 sets - 10 reps - Supine Heel Slide  - 1 x daily - 7 x weekly - 2 sets - 10 reps - Supine Hip Adduction Isometric with Ball  - 1 x daily - 7 x weekly - 2 sets - 10 reps - Hooklying Gluteal Sets  - 1 x daily - 7 x weekly - 2 sets - 10 reps - Seated Heel Raise  - 1 x daily - 7 x weekly - 2 sets - 10 reps - Seated Heel Toe Raises  - 1 x daily - 7 x weekly - 2 sets - 10 reps  Access Code: [email or web address] URL: [email or web address] Date: 06/27/2022 Prepared by: Dorene Grebe  Exercises - Straight Leg Raise  - 1 x daily - 7 x weekly - 2 sets -  10 reps - Supine Hip Adduction Isometric with Ball  - 1 x daily - 7 x weekly - 2 sets - 10 reps - Hooklying Gluteal Sets  - 1 x daily - 7 x weekly - 2 sets - 10 reps - Standing Hip Abduction  - 1 x daily - 7 x weekly - 2 sets - 12 reps - Mini Squat with Counter Support  - 1 x daily - 7 x weekly - 2 sets - 12 reps  ASSESSMENT:  CLINICAL IMPRESSION:  Pt reports to PT with 3/10 pain in her L hip. Session today consisted of exercises  aimed to improve her standing/walking tolerance and improve her hip strength/endurance. Pt continues to be limited by L hip pain with prolonged standing/walking; she demonstrates antalgic gait with decreased stance time on LLE. Patient will continue to benefit from skilled therapy to address remaining deficits in order to improve quality of life and return to PLOF.    OBJECTIVE IMPAIRMENTS Abnormal gait, decreased activity tolerance, decreased balance, decreased endurance, decreased mobility, difficulty walking, decreased ROM, decreased strength, hypomobility, impaired flexibility, impaired sensation, improper body mechanics, postural dysfunction, obesity, and pain.   ACTIVITY LIMITATIONS carrying, lifting, bending, sitting, standing, squatting, sleeping, stairs, transfers, bed mobility, dressing, and locomotion level  PARTICIPATION LIMITATIONS: cleaning, laundry, driving, shopping, community activity, occupation, and yard work  PERSONAL FACTORS Age, Education, and Past/current experiences are also affecting patient's functional outcome.   REHAB POTENTIAL: Good  CLINICAL DECISION MAKING: Evolving/moderate complexity  EVALUATION COMPLEXITY: Moderate   GOALS: Goals reviewed with patient Yes  LONG TERM GOALS: Target date: 02/10/23  Pt will increase FOTO score to > 58 Baseline: 9/7: 52 (no significant changes).  11/8: 49 (slight regression secondary to increase L anterior hip pain with stair climbing/ increase distance walked.  1/16: 46 (continued regression/ pt. Considering THA).  4/30: 52; 6/20: 52  Goal status: Not met  2.  Pt will decrease pain level to < 2/10 during all ADLs, in order to efficiently accomplish activities with decreased pain and increased independence Baseline: see above; 6/20: reports 3.5/10 this session  Goal status: Partially met  3.  Pt will increase Active L knee flexion ROM to > 120 degrees. Baseline: 99, 106 passive.  8/8: 118 deg.  10/25: 118 deg. 2/28: 119 deg.  6/20: 109 deg Goal status: Partially met  4.  Pt. Will increase B LE muscle strength 1/2 muscle grade to improve stepping up into drivers side of car.    Baseline: see above; 6/20: L hip flexion 3-/5; L knee ext/flex: 4-  Goal status: IN PROGRESS  5.  Patient (< 76 years old) will complete five times sit to stand test in < 10 seconds indicating an increased LE strength and improved balance.  Baseline: 6/20: 19.53 sec  Goal status: INITIAL 6. Patient will increase six minute walk test distance to >800' for progression to community ambulator and improve gait ability with reports of L hip pain <2/10  Baseline: 6/20: 577' with 4/10 pain in L hip   Goal status: INITIAL     PLAN: PT FREQUENCY: 1x/week  PT DURATION: 8 weeks  PLANNED INTERVENTIONS: Therapeutic exercises, Therapeutic activity, Neuromuscular re-education, Balance training, Gait training, Patient/Family education, Joint mobilization, Stair training, Aquatic Therapy, Cryotherapy, Moist heat, and Manual therapy.  PLAN FOR NEXT SESSION:  Standing dynamic ex., functional activity conditioning (walking, steps).  CHECK GOALS   Cammie Mcgee, PT, DPT # [phone] Cena Benton, SPT 02/02/23, 11:31 AM

## 2023-02-09 ENCOUNTER — Encounter: Payer: Self-pay | Admitting: Physical Therapy

## 2023-02-09 ENCOUNTER — Ambulatory Visit: Payer: BLUE CROSS/BLUE SHIELD | Admitting: Physical Therapy

## 2023-02-09 DIAGNOSIS — R29898 Other symptoms and signs involving the musculoskeletal system: Secondary | ICD-10-CM | POA: Diagnosis not present

## 2023-02-09 DIAGNOSIS — M25552 Pain in left hip: Secondary | ICD-10-CM

## 2023-02-09 DIAGNOSIS — M5459 Other low back pain: Secondary | ICD-10-CM | POA: Diagnosis not present

## 2023-02-09 DIAGNOSIS — R2689 Other abnormalities of gait and mobility: Secondary | ICD-10-CM | POA: Diagnosis not present

## 2023-02-09 DIAGNOSIS — R269 Unspecified abnormalities of gait and mobility: Secondary | ICD-10-CM | POA: Diagnosis not present

## 2023-02-09 DIAGNOSIS — M6281 Muscle weakness (generalized): Secondary | ICD-10-CM | POA: Diagnosis not present

## 2023-02-09 DIAGNOSIS — M25652 Stiffness of left hip, not elsewhere classified: Secondary | ICD-10-CM

## 2023-02-09 NOTE — Therapy (Signed)
OUTPATIENT PHYSICAL THERAPY THORACOLUMBAR TREATMENT/RECERTIFICATION  Patient Name: Arkesha Raysor MRN: [id number] DOB:[birth year], 57 y.o., female Today's Date: 02/09/2023   PT End of Session - 02/09/23 1252     Visit Number 61    Number of Visits 67    Date for PT Re-Evaluation 03/23/23    PT Start Time 1032    PT Stop Time 1113    PT Time Calculation (min) 41 min    Activity Tolerance Patient limited by pain    Behavior During Therapy [place] for tasks assessed/performed             Past Medical History:  Diagnosis Date   Diabetes mellitus without complication (HCC)    Hypertension    Past Surgical History:  Procedure Laterality Date   CESAREAN SECTION     CHOLECYSTECTOMY     COLONOSCOPY WITH PROPOFOL N/A 12/10/2019   Procedure: COLONOSCOPY WITH PROPOFOL;  Surgeon: Wyline Mood, MD;  Location: [facility] ENDOSCOPY;  Service: Gastroenterology;  Laterality: N/A;   COLONOSCOPY WITH PROPOFOL N/A 12/24/2019   Procedure: COLONOSCOPY WITH PROPOFOL;  Surgeon: Wyline Mood, MD;  Location: [facility] ENDOSCOPY;  Service: Gastroenterology;  Laterality: N/A;   FRACTURE SURGERY     Patient Active Problem List   Diagnosis Date Noted   Acute streptococcal pharyngitis 10/04/2022   Hyperlipidemia associated with type 2 diabetes mellitus (HCC) 09/22/2021   Displaced fracture of base of neck of right femur, sequela 07/16/2021   Closed fracture of posterior wall of left acetabulum with routine healing 06/16/2021   Palpitations 03/19/2020   Diarrhea 03/19/2020   Premature atrial contractions 08/20/2018   Morbid (severe) obesity due to excess calories (HCC) 08/01/2018   Shortness of breath 07/26/2018   Chronic cough 03/22/2018   Uncomplicated type 2 diabetes mellitus (HCC) 03/22/2018   Hypertension associated with diabetes (HCC) 03/22/2018    PCP: Larae Grooms, NP   REFERRING PROVIDER: Ernst Bowler, MD  REFERRING DIAG:  Diagnosis  S32.15XD (ICD-10-CM) - Type 2 fracture of sacrum,  subsequent encounter for fracture with routine healing  S32.422D (ICD-10-CM) - Displaced fracture of posterior wall of left acetabulum, subsequent encounter for fracture with routine healing    Rationale for Evaluation and Treatment Rehabilitation  THERAPY DIAG:  Muscle weakness (generalized)  Gait difficulty  Balance problems  Other low back pain  Stiffness of left hip joint  Pain in left hip  ONSET DATE: 05/28/21  SUBJECTIVE:                                                                                                                                                                                            PERTINENT  HISTORY:  Hx of L hip dislocation, and screws placed in lower back after her injury. Pt left hospital in a walker, and utilized a WC originally during [facility]. Pt has decreased ankle DF in L ankle, and pain in her lateral foot / 5th metatarsal. Pt had L knee scope, and has tenderness/bruising in L LE along tibia.  PAIN:  Are you having pain? NPRS scale: 4-5/10 Pain location: L hip (anterior) Pain description: popping and dull/achy Aggravating factors: standing, bending forward (especially left) Relieving factors: sitting down, tylenol PRN   PRECAUTIONS: Anterior hip, Posterior hip, Knee, and Fall  WEIGHT BEARING RESTRICTIONS no  FALLS:  Has patient fallen in last 6 months Yes. Number of falls 1  LIVING ENVIRONMENT: Lives with: lives with their family Lives in: House/apartment Stairs: yes - 2 to get into home with raining Has following equipment at home: Quad cane large base and Wheelchair (manual)  OCCUPATION:  Pt is currently on long term disability. Previously worked at [employer] prior to injury. Would like to get back to work if the company can find a position that is more stationary.  PLOF: Independent  PATIENT GOALS Pt wants to alleviate pain, become more mobile, strengthen back musculature, strengthen LEs  OBJECTIVE:   DIAGNOSTIC  FINDINGS:  LUMBAR SPINE - 2-3 VIEW   COMPARISON:  MRI 09/28/2009   FINDINGS: 4-5 mm retrolisthesis L5-S1, slightly progressive since prior examination, possibly degenerative in nature. Otherwise normal lumbar lordosis. No acute fracture of the lumbar spine. Vertebral body height is preserved. Intervertebral disc space narrowing and endplate remodeling at L4-5 and L5-S1 is in keeping with mild degenerative disc disease at these levels. Paraspinal soft tissues are unremarkable. Right sacroiliac arthrodesis has been performed with 2 partially threaded screws.   IMPRESSION: No acute fracture or traumatic listhesis.   Degenerative changes L4-S1 with 5 mm retrolisthesis L5-S1.   Electronically Signed   By: Helyn Numbers M.D.   On: 09/17/2021 22:13  PATIENT SURVEYS:  FOTO 52 ; 61  SCREENING FOR RED FLAGS: Bowel or bladder incontinence: No Spinal tumors: No Cauda equina syndrome: No Compression fracture: No Abdominal aneurysm: No  COGNITION:  Overall cognitive status: Within functional limits for tasks assessed     SENSATION: Not tested - patient reports altered sensation in L LE/foot  POSTURE: rounded shoulders, forward head, and weight shift left  PALPATION: Moderate L medial lower leg tenderness with palpation  LUMBAR ROM:   Active  AROM  eval  Flexion 50% limited (compensation to R)  Extension 75% limited (pain)  Right lateral flexion   Left lateral flexion   Right rotation 50% limited  Left rotation 25% limited   (Blank rows = not tested)  LOWER EXTREMITY ROM:     PROM/AROM Right eval Left eval  Hip flexion WFL 119/ 90 deg.  Hip extension    Hip abduction [facility]  Hip adduction    Hip internal rotation    Hip external rotation    Knee flexion WFL 106/ 99 deg.  Knee extension    Ankle dorsiflexion WFL Limited by pain  Ankle plantarflexion WFL Limited by pain  Ankle inversion    Ankle eversion     (Blank rows = not tested)  LOWER EXTREMITY MMT:     MMT Right eval Left eval  Hip flexion 4/5 3/5  Hip extension    Hip abduction 4/5 3/5  Hip adduction 4/5 4/5  Hip internal rotation 5/5 unable  Hip external rotation 5/5 3/5  Knee flexion  5/5 3/5 (pain)  Knee extension 5/5 4/5  Ankle dorsiflexion 4+/5 3/5  Ankle plantarflexion 5/5 4/5  Ankle inversion    Ankle eversion     (Blank rows = not tested)  FUNCTIONAL TESTS:  5 times sit to stand: 25.4 seconds .  9/21:  14.06 seconds (marked improvement).    GAIT: Distance walked: in clinic Assistive device utilized: Quad cane small base Level of assistance: Modified independence Comments: R sided Trendelenburg.  L antalgic gait pattern with decrease stance on L with short R LE step length/ shuffling.  No arm swing. Walks with wide base of support, with mild supination of B feet. Pt had limited endurance, and significantly limited L hip flexion and DF to clear her foot during swing phase.     01/25/22: 5xSTS: 15.94 sec./ 14.94 sec.  (Marked improvement).      9/21:  5xSTS: 14.06 seconds (marked improvement).      11/8: 5xSTS: 12.48 seconds  (improvement).      06/24/22: 9.89 seconds (marked improvement).    2/28: L/R LE strength testing: hip flexion 3-/4, quad 4+/4+, R hip abduction 4-/3+.  L knee flexion 119 deg.   Reassessment of LE muscle strength: R hip/knee strength grossly 5/5 MMT except hip abduction 4+/5.  L hip flexion 3+/5 MMT, hip abduction 4-/5, adduction 4/5, extension 4/5 MMT.     TREATMENT:  02/09/2023  Subjective:  Pt reports low 3/10 pain in her L hip today. Says her HEP is still going well, hasn't been back to the pool yet.   Treatment:     Neuro.mm.:  L Knee Flexion ROM: 106 AROM, 115 PROM   Hip flex: 3/5 L, 4+/5 R Hip IR: 3/5 L, 5/5 R Hip ER: 3/5 L, 5/5 R Hip abd: 4/5 L, 4/5 R Hip add: 4/5 L, 4/5 R  5XSTS: 20.87 seconds first attempt (use of hands on knees), 11.2 seconds on second attempt (use of hands on knees)   : 635 feet, use of SPC in R  UE  See updated goals.    There.ex.:   None today  Not today: Nustep B UE/LE for 10 min at L5.  0.5 miles.  Discussed home exercises/ activity over past 2 weeks.  Forward/ lateral walking in //-bars (3 laps each) with 3# AW:   Min. UE assist and progressing to no UE assist.    Seated LAQ/ marching 20x each: 3# AW with LAQ, no weight wifh marching - (4/10) L hip pain at end of seated LAQ   Sit to stand from gray chair 3x with no UE support  Hurdle step overs forwards/lateral: 2 laps each in //-bars.   Standing heel/toe raises x 20   Amb. In gym/ hallway out to car with use of SPC and consistent 2-point gait pattern.  No LOB   Walking partial lunges in //-bars 2 laps.   PATIENT EDUCATION:  Education details: Access Code: [id number] Person educated: Patient Education method: Explanation, Demonstration, and Handouts Education comprehension: verbalized understanding and returned demonstration   HOME EXERCISE PROGRAM: Access Code: [email or web address] URL: [email or web address] Date: 11/25/2021 Prepared by: Dorene Grebe  Exercises - Straight Leg Raise  - 1 x daily - 7 x weekly - 2 sets - 10 reps - Supine Heel Slide  - 1 x daily - 7 x weekly - 2 sets - 10 reps - Supine Hip Adduction Isometric with Ball  - 1 x daily - 7 x weekly - 2 sets - 10 reps - Hooklying Gluteal Sets  -  1 x daily - 7 x weekly - 2 sets - 10 reps - Seated Heel Raise  - 1 x daily - 7 x weekly - 2 sets - 10 reps - Seated Heel Toe Raises  - 1 x daily - 7 x weekly - 2 sets - 10 reps  Access Code: [email or web address] URL: [email or web address] Date: 06/27/2022 Prepared by: Dorene Grebe  Exercises - Straight Leg Raise  - 1 x daily - 7 x weekly - 2 sets - 10 reps - Supine Hip Adduction Isometric with Ball  - 1 x daily - 7 x weekly - 2 sets - 10 reps - Hooklying Gluteal Sets  - 1 x daily - 7 x weekly - 2 sets - 10 reps - Standing Hip Abduction  - 1 x daily - 7 x weekly - 2 sets - 12 reps - Mini Squat  with Counter Support  - 1 x daily - 7 x weekly - 2 sets - 12 reps  ASSESSMENT:  CLINICAL IMPRESSION:  Pt reports to PT with 3/10 pain in her L hip. Session today consisted of reassessing goals and functional tests. No significant changes in pt's L knee flexion ROM or L hip strength; pt continues to be limited primarily due to pain in the left hip. Pt demonstrated improved time with 5XSTS and improved distance with .  See updated goals.   Patient will continue to benefit from skilled therapy to address remaining deficits in order to improve quality of life and return to PLOF.    OBJECTIVE IMPAIRMENTS Abnormal gait, decreased activity tolerance, decreased balance, decreased endurance, decreased mobility, difficulty walking, decreased ROM, decreased strength, hypomobility, impaired flexibility, impaired sensation, improper body mechanics, postural dysfunction, obesity, and pain.   ACTIVITY LIMITATIONS carrying, lifting, bending, sitting, standing, squatting, sleeping, stairs, transfers, bed mobility, dressing, and locomotion level  PARTICIPATION LIMITATIONS: cleaning, laundry, driving, shopping, community activity, occupation, and yard work  PERSONAL FACTORS Age, Education, and Past/current experiences are also affecting patient's functional outcome.   REHAB POTENTIAL: Good  CLINICAL DECISION MAKING: Evolving/moderate complexity  EVALUATION COMPLEXITY: Moderate   GOALS: Goals reviewed with patient Yes  LONG TERM GOALS: Target date: 03/23/23  Pt will increase FOTO score to > 58 Baseline: 9/7: 52 (no significant changes).  11/8: 49 (slight regression secondary to increase L anterior hip pain with stair climbing/ increase distance walked.  1/16: 46 (continued regression/ pt. Considering THA).  4/30: 52; 6/20: 52.  8/22: 63 (goal met) Goal status: Goal Met  2.  Pt will decrease pain level to < 2/10 during all ADLs, in order to efficiently accomplish activities with decreased pain and  increased independence Baseline: see above; 6/20: reports 3.5/10 this session (usually still around 3 with activity) Goal status: Partially met  3.  Pt will increase Active L knee flexion ROM to > 120 degrees. Baseline: 99, 106 passive.  8/8: 118 deg.  10/25: 118 deg. 2/28: 119 deg. 6/20: 109 deg, 8/22: 115 passive, 106 active Goal status: Partially met  4.  Pt. Will increase B LE muscle strength 1/2 muscle grade to improve stepping up into drivers side of car.    Baseline: see above; 6/20: L hip flexion 3-/5; L knee ext/flex: 4-  Goal status: IN PROGRESS  5.  Patient (< 65 years old) will complete five times sit to stand test in < 10 seconds indicating an increased LE strength and improved balance.  Baseline: 6/20: 19.53 sec  Goal status: 8/22: 11.2 seconds, use of hands on knees  6.  Patient will increase six minute walk test distance to >800' for progression to community ambulator and improve gait ability with reports of L hip pain <2/10  Baseline: 6/20: 577' with 4/10 pain in L hip.  8/22: 635 feet with SPC  Goal status: Partially met    PLAN: PT FREQUENCY: 1x/week  PT DURATION: 6 weeks  PLANNED INTERVENTIONS: Therapeutic exercises, Therapeutic activity, Neuromuscular re-education, Balance training, Gait training, Patient/Family education, Joint mobilization, Stair training, Aquatic Therapy, Cryotherapy, Moist heat, and Manual therapy.  PLAN FOR NEXT SESSION:  Standing dynamic ex., functional activity conditioning (walking, steps). Determine POC and tx. Schedule/ authorization  Cammie Mcgee, PT, DPT # [phone] Cena Benton, SPT 02/09/23, 1:07 PM

## 2023-02-23 ENCOUNTER — Telehealth: Payer: Self-pay | Admitting: Nurse Practitioner

## 2023-02-23 NOTE — Telephone Encounter (Signed)
Pt is calling in requesting a prescription for baclofen (LIORESAL) 10 MG tablet [860] . Pt has an appointment scheduled for 02/24/23 due to her back pain being extreme. Please advise.

## 2023-02-24 ENCOUNTER — Encounter: Payer: Self-pay | Admitting: Emergency Medicine

## 2023-02-24 ENCOUNTER — Ambulatory Visit: Payer: BLUE CROSS/BLUE SHIELD | Admitting: Nurse Practitioner

## 2023-02-24 ENCOUNTER — Ambulatory Visit
Admission: EM | Admit: 2023-02-24 | Discharge: 2023-02-24 | Disposition: A | Discharge status: Home / Self Care | Payer: BLUE CROSS/BLUE SHIELD | Attending: Family Medicine | Admitting: Family Medicine

## 2023-02-24 ENCOUNTER — Ambulatory Visit (INDEPENDENT_AMBULATORY_CARE_PROVIDER_SITE_OTHER): Payer: BLUE CROSS/BLUE SHIELD

## 2023-02-24 DIAGNOSIS — M545 Low back pain, unspecified: Secondary | ICD-10-CM

## 2023-02-24 DIAGNOSIS — M5127 Other intervertebral disc displacement, lumbosacral region: Secondary | ICD-10-CM | POA: Diagnosis not present

## 2023-02-24 DIAGNOSIS — G8929 Other chronic pain: Secondary | ICD-10-CM

## 2023-02-24 DIAGNOSIS — M47816 Spondylosis without myelopathy or radiculopathy, lumbar region: Secondary | ICD-10-CM | POA: Diagnosis not present

## 2023-02-24 DIAGNOSIS — M549 Dorsalgia, unspecified: Secondary | ICD-10-CM

## 2023-02-24 DIAGNOSIS — M1612 Unilateral primary osteoarthritis, left hip: Secondary | ICD-10-CM | POA: Diagnosis not present

## 2023-02-24 MED ORDER — PREDNISONE 10 MG (21) PO TBPK: ORAL_TABLET | Freq: Every day | ORAL | 0 refills | Status: DC

## 2023-02-24 MED ORDER — NAPROXEN 500 MG PO TABS: 500.0000 mg | ORAL_TABLET | Freq: Two times a day (BID) | ORAL | 0 refills | Status: DC

## 2023-02-24 MED ORDER — KETOROLAC TROMETHAMINE 60 MG/2ML IM SOLN
30.0000 mg | Freq: Once | INTRAMUSCULAR | Status: AC
  Administered 2023-02-24: 30 mg via INTRAMUSCULAR

## 2023-02-24 MED ORDER — TIZANIDINE HCL 4 MG PO TABS: 4.0000 mg | ORAL_TABLET | Freq: Four times a day (QID) | ORAL | 0 refills | Status: DC | PRN

## 2023-02-24 NOTE — ED Triage Notes (Signed)
Patient c/o back pain and spasms that started getting worse yesterday.  Patient has been taking Ibuprofen for the pain.  Patient denies fall or injury.

## 2023-02-24 NOTE — Discharge Instructions (Signed)
If medication was prescribed, stop by the pharmacy to pick up your prescriptions.  For your  pain, Take 1000 mg Tylenol 3 times a day, take muscle relaxer (Tizanidine) 2-3 times a day, take Naprosyn twice a day,  as needed for pain.  Apply warm compresses intermittently, as needed.  As pain recedes, begin normal activities slowly as tolerated.  Follow up with primary care provider or an orthopedic provider, if symptoms persist.  Watch for worsening symptoms such as an increasing weakness or loss of sensation, increasing pain and/or the loss of bladder or bowel function. Should any of these occur, go to the emergency department immediately.

## 2023-02-24 NOTE — ED Provider Notes (Signed)
[facility]    CSN: [id number] Arrival date & time: 02/24/23  1707      History   Chief Complaint Chief Complaint  Patient presents with   Back Pain    HPI  HPI Lynzee Howle is a 57 y.o. female.   Aliciana presents for acute mid to low back pain that started yesterday after reaching for something.  Feels a intense pulling sensation- like a spasm. Uses a cane and had trouble walking.  Took 3 ibuprofen before bed which helped but its still there. No heavy lifting. Pain without radiation. Pain rated 7/10 current but last night it was 10/10 which was worse with breathing and walking.  Denies weakness, numbness, tingling, dysuria, leg pain, leg weakness, abdominal pain, incontinence, pelvic pain, perianal numbness. Denies history of kidney stones.   Continues to have pain with movement. Wyoma ***does not feel like his*** legs are weak.   Has ***never injured his/her*** back before.   Has tried {home care:60200} heating pad with {relief:12621}.  ***No change in pain day vs night.    Red flags*** Perianal numbness, cancer, unexplained weight loss, immunosuppression, prolonged use of steroids, history of IV drug use, pain that is increased or unrelieved by rest, fever, bladder or bowel incontinence, significant trauma related to age   Fever : no  Weight loss: *** Perianal numbness: *** Bowel incontinence: *** Bladder incontinence: *** Trauma: ***  Sore throat: no   Cough: no Hydration: normal  Abdominal pain: no Nausea: no Vomiting: no Abdominal pain: *** Dysuria:*** Sleep disturbance: no *** Neck Pain: no Headache: no     Past Medical History:  Diagnosis Date   Diabetes mellitus without complication (HCC)    Hypertension     Patient Active Problem List   Diagnosis Date Noted   Acute streptococcal pharyngitis 10/04/2022   Hyperlipidemia associated with type 2 diabetes mellitus (HCC) 09/22/2021   Displaced fracture of base of neck of right femur,  sequela 07/16/2021   Closed fracture of posterior wall of left acetabulum with routine healing 06/16/2021   Palpitations 03/19/2020   Diarrhea 03/19/2020   Premature atrial contractions 08/20/2018   Morbid (severe) obesity due to excess calories (HCC) 08/01/2018   Shortness of breath 07/26/2018   Chronic cough 03/22/2018   Uncomplicated type 2 diabetes mellitus (HCC) 03/22/2018   Hypertension associated with diabetes (HCC) 03/22/2018    Past Surgical History:  Procedure Laterality Date   CESAREAN SECTION     CHOLECYSTECTOMY     COLONOSCOPY WITH PROPOFOL N/A 12/10/2019   Procedure: COLONOSCOPY WITH PROPOFOL;  Surgeon: Wyline Mood, MD;  Location: [facility] ENDOSCOPY;  Service: Gastroenterology;  Laterality: N/A;   COLONOSCOPY WITH PROPOFOL N/A 12/24/2019   Procedure: COLONOSCOPY WITH PROPOFOL;  Surgeon: Wyline Mood, MD;  Location: [facility] ENDOSCOPY;  Service: Gastroenterology;  Laterality: N/A;   FRACTURE SURGERY      OB History   No obstetric history on file.      Home Medications    Prior to Admission medications   Medication Sig Start Date End Date Taking? Authorizing Provider  albuterol (PROVENTIL HFA;VENTOLIN HFA) 108 (90 Base) MCG/ACT inhaler Inhale 1-2 puffs into the lungs every 6 (six) hours as needed for wheezing or shortness of breath. 07/19/16   Hagler, Jami L, PA-C  amLODipine (NORVASC) 10 MG tablet Take 1 tablet (10 mg total) by mouth daily. 12/07/22   Larae Grooms, NP  empagliflozin (JARDIANCE) 10 MG TABS tablet Take 1 tablet (10 mg total) by mouth daily before breakfast. 12/07/22  Larae Grooms, NP  glipiZIDE (GLUCOTROL) 10 MG tablet Take 1 tablet (10 mg total) by mouth 2 (two) times daily before a meal. 12/07/22   Larae Grooms, NP  hydrochlorothiazide (HYDRODIURIL) 25 MG tablet Take 1 tablet (25 mg total) by mouth daily. 12/07/22   Larae Grooms, NP  hydrocortisone 2.5 % cream SMARTSIG:sparingly Topical Twice Daily 10/14/21   [provider]   ibuprofen (ADVIL) 600 MG tablet Take 1 tablet (600 mg total) by mouth every 6 (six) hours as needed. 04/05/21   Larae Grooms, NP  metFORMIN (GLUCOPHAGE) 1000 MG tablet Take 1 tablet (1,000 mg total) by mouth 2 (two) times daily with a meal. 12/07/22   Larae Grooms, NP  rosuvastatin (CRESTOR) 5 MG tablet Take 1 tablet (5 mg total) by mouth daily. 12/07/22   Larae Grooms, NP  tirzepatide [facility]) 7.5 MG/0.5ML Pen Inject 7.5 mg into the skin once a week. 12/07/22   Larae Grooms, NP  valsartan (DIOVAN) 80 MG tablet Take 1 tablet (80 mg total) by mouth daily. 12/07/22   Larae Grooms, NP    Family History Family History  Problem Relation Age of Onset   Hypertension Mother    Diabetes Mother     Social History Social History   Tobacco Use   Smoking status: Never   Smokeless tobacco: Never  Vaping Use   Vaping status: Never Used  Substance Use Topics   Alcohol use: Never    Alcohol/week: 0.0 standard drinks of alcohol   Drug use: Never     Allergies   Patient has no known allergies.   Review of Systems Review of Systems: egative unless otherwise stated in HPI.      Physical Exam Triage Vital Signs ED Triage Vitals  Encounter Vitals Group     BP 02/24/23 1733 125/81     Systolic BP Percentile --      Diastolic BP Percentile --      Pulse Rate 02/24/23 1733 82     Resp 02/24/23 1733 15     Temp 02/24/23 1733 98.9 F (37.2 C)     Temp Source 02/24/23 1733 Oral     SpO2 02/24/23 1733 98 %     Weight 02/24/23 1732 280 lb 13.9 oz (127.4 kg)     Height 02/24/23 1732 5\' 4"  (1.626 m)     Head Circumference --      Peak Flow --      Pain Score 02/24/23 1732 7     Pain Loc --      Pain Education --      Exclude from Growth Chart --    No data found.  Updated Vital Signs BP 125/81 (BP Location: Left Arm)   Pulse 82   Temp 98.9 F (37.2 C) (Oral)   Resp 15   Ht 5\' 4"  (1.626 m)   Wt 127.4 kg   LMP 08/20/2015 (Approximate) Comment: denies preg   SpO2 98%   BMI 48.21 kg/m   Visual Acuity Right Eye Distance:   Left Eye Distance:   Bilateral Distance:    Right Eye Near:   Left Eye Near:    Bilateral Near:     Physical Exam GEN: well appearing female in no acute distress  CVS: well perfused  RESP: speaking in full sentences without pause, no respiratory distress  MSK:  Lumbar spine: ***cane  - Inspection: no gross deformity or asymmetry, swelling or ecchymosis. No skin changes  - Palpation: No ***TTP over the spinous processes, ***bilateral  lumbar paraspinal muscles, no SI joint tenderness bilaterally - ROM: full active ROM of the lumbar spine in flexion and extension but with mild pain ***with flexion/extension  - Strength: 5/5 strength of lower extremity in L4-S1 nerve root distributions b/l - Neuro: sensation intact in the L4-S1 nerve root distribution b/l, ***2+ L4 and S1 reflexes - Special testing: Negative straight leg raise SKIN: warm, dry, no overly skin rash or erythema    UC Treatments / Results  Labs (all labs ordered are listed, but only abnormal results are displayed) Labs Reviewed - No data to display  EKG   Radiology No results found.   Procedures Procedures (including critical care time)  Medications Ordered in UC Medications - No data to display  Initial Impression / Assessment and Plan / UC Course  I have reviewed the triage vital signs and the nursing notes.  Pertinent labs & imaging results that were available during my care of the patient were reviewed by me and considered in my medical decision making (see chart for details).      Pt is a 57 y.o.  female with *** days of *** back pain after ***.  Has history of ***low back pain.   Obtained ***lumbar plain films.  Xray personally interpreted by me were ***unremarkable for fracture, ***or significant malalignment.  Radiologist report reviewed and notes ***  Patient to gradually return to normal activities, as tolerated and continue  ordinary activities within the limits permitted by pain. Prescribed Naproxen sodium *** and muscle relaxer *** for pain relief.  Advised patient to avoid other NSAIDs while taking ***Naprosyn. Tylenol and Lidocaine patches PRN for multimodal pain relief. Counseled patient on red flag symptoms and when to seek immediate care.  ***No red flags suggesting cauda equina syndrome or progressive major motor weakness. Patient to follow up with orthopedic provider if symptoms do not improve with conservative treatment.  Return and ED precautions given.    Discussed MDM, treatment plan and plan for follow-up with patient who agrees with plan.   Final Clinical Impressions(s) / UC Diagnoses   Final diagnoses:  None   Discharge Instructions   None    ED Prescriptions   None    PDMP not reviewed this encounter.

## 2023-02-24 NOTE — Telephone Encounter (Signed)
FYI, patient has an appointment this afternoon.

## 2023-03-01 DIAGNOSIS — H35033 Hypertensive retinopathy, bilateral: Secondary | ICD-10-CM | POA: Diagnosis not present

## 2023-03-01 DIAGNOSIS — E113493 Type 2 diabetes mellitus with severe nonproliferative diabetic retinopathy without macular edema, bilateral: Secondary | ICD-10-CM | POA: Diagnosis not present

## 2023-03-01 LAB — HM DIABETES EYE EXAM

## 2023-03-06 ENCOUNTER — Encounter: Payer: Self-pay | Admitting: Podiatry

## 2023-03-06 ENCOUNTER — Ambulatory Visit: Payer: BLUE CROSS/BLUE SHIELD | Admitting: Podiatry

## 2023-03-06 DIAGNOSIS — M79674 Pain in right toe(s): Secondary | ICD-10-CM

## 2023-03-06 DIAGNOSIS — M79675 Pain in left toe(s): Secondary | ICD-10-CM

## 2023-03-06 DIAGNOSIS — B351 Tinea unguium: Secondary | ICD-10-CM

## 2023-03-06 DIAGNOSIS — E119 Type 2 diabetes mellitus without complications: Secondary | ICD-10-CM | POA: Diagnosis not present
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
## 2023-03-22 ENCOUNTER — Ambulatory Visit
Admission: EM | Admit: 2023-03-22 | Discharge: 2023-03-22 | Disposition: A | Discharge status: Home / Self Care | Payer: BLUE CROSS/BLUE SHIELD | Attending: Family Medicine | Admitting: Family Medicine

## 2023-03-22 ENCOUNTER — Ambulatory Visit: Payer: Self-pay | Admitting: *Deleted

## 2023-03-22 DIAGNOSIS — M545 Low back pain, unspecified: Secondary | ICD-10-CM | POA: Diagnosis not present

## 2023-03-22 DIAGNOSIS — G8929 Other chronic pain: Secondary | ICD-10-CM | POA: Diagnosis not present

## 2023-03-22 MED ORDER — KETOROLAC TROMETHAMINE 60 MG/2ML IM SOLN
30.0000 mg | Freq: Once | INTRAMUSCULAR | Status: AC
  Administered 2023-03-22: 30 mg via INTRAMUSCULAR

## 2023-03-22 MED ORDER — PREDNISONE 10 MG (21) PO TBPK: ORAL_TABLET | Freq: Every day | ORAL | 0 refills | Status: DC

## 2023-03-22 NOTE — Discharge Instructions (Signed)
If medication was prescribed, stop by the pharmacy to pick up your prescriptions.  For your  pain, Take 1000 mg Tylenol 3 times a day, take muscle relaxer (Tizanidine) 2-3 times a day, take Naprosyn twice a day,  as needed for pain.  Apply warm compresses intermittently, as needed.  As pain recedes, begin normal activities slowly as tolerated.  Follow up with primary care provider or an orthopedic provider, if symptoms persist.  Watch for worsening symptoms such as an increasing weakness or loss of sensation, increasing pain and/or the loss of bladder or bowel function. Should any of these occur, go to the emergency department immediately.

## 2023-03-22 NOTE — ED Triage Notes (Signed)
Pt was seen on 02/24/23 for back pain, pt states no improvement.

## 2023-03-22 NOTE — Telephone Encounter (Signed)
  Chief Complaint: severe back pain returned, requesting prednisone again  Symptoms: sever right side /back pain. Started back last night and comes and goes in intensity but is constant. Certain movements causes severe pain. Naproxyn and advil not effective for pain . Reports pain with breathing in  Frequency: last night  Pertinent Negatives: Patient denies chest pain  Disposition: [] ED /[x] Urgent Care (no appt availability in office) / [] Appointment(In office/virtual)/ []  [place] [facility]/ [] Home Care/ [] Refused Recommended Disposition /[] [place] [facility]/ []  Follow-up with PCP Additional Notes:   No available appt today . Recommended UC or ED. Patient reports she will probably go to UC.      Reason for Disposition  [1] SEVERE back pain (e.g., excruciating, unable to do any normal activities) AND [2] not improved 2 hours after pain medicine  Answer Assessment - Initial Assessment Questions 1. ONSET: "When did the pain begin?"      Last night  2. LOCATION: "Where does it hurt?" (upper, mid or lower back)     Right low back / side  3. SEVERITY: "How bad is the pain?"  (e.g., Scale 1-10; mild, moderate, or severe)   - MILD (1-3): Doesn't interfere with normal activities.    - MODERATE (4-7): Interferes with normal activities or awakens from sleep.    - SEVERE (8-10): Excruciating pain, unable to do any normal activities.      severe 4. PATTERN: "Is the pain constant?" (e.g., yes, no; constant, intermittent)      Constant  5. RADIATION: "Does the pain shoot into your legs or somewhere else?"     No  6. CAUSE:  "What do you think is causing the back pain?"      *No Answer* 7. BACK OVERUSE:  "Any recent lifting of heavy objects, strenuous work or exercise?"     *No Answer* 8. MEDICINES: "What have you taken so far for the pain?" (e.g., nothing, acetaminophen, NSAIDS)     *No Answer* 9. NEUROLOGIC SYMPTOMS: "Do you have any weakness, numbness, or problems with bowel/bladder  control?"     Na  10. OTHER SYMPTOMS: "Do you have any other symptoms?" (e.g., fever, abdomen pain, burning with urination, blood in urine)       Right side back pain  11. PREGNANCY: "Is there any chance you are pregnant?" "When was your last menstrual period?"       na  Protocols used: Back Pain-A-AH

## 2023-03-22 NOTE — ED Provider Notes (Addendum)
[facility]    CSN: [id number] Arrival date & time: 03/22/23  1124      History   Chief Complaint Chief Complaint  Patient presents with   Back Pain    HPI  HPI Yesenia Stevens is a 57 y.o. female.   Yesenia Stevens presents for constant sharp back pain that is worse on the right.  Seen for similar on February 24, 2023.  She stopped taking the medications the next day as her back felt better.  When the back pain came back 2 days later, she started the prednisone but not the Naprosyn or muscle relaxers.  She has had intermittent back pain since finishing the course of medication.  Pain went away but came back yesterday therefore she came back to the urgent care.  Took naproxen and one of her muscle relaxers without an help yesterday.  Pain is "12 /10" .  Denies shortness of breath, chest pain, abdominal pain, new numbness or tingling, leg or arm weakness, urinary incontinence or bladder incontinence, urinary symptoms.  Denies recent falls or trauma.      Past Medical History:  Diagnosis Date   Diabetes mellitus without complication (HCC)    Hypertension     Patient Active Problem List   Diagnosis Date Noted   Acute streptococcal pharyngitis 10/04/2022   Hyperlipidemia associated with type 2 diabetes mellitus (HCC) 09/22/2021   Displaced fracture of base of neck of right femur, sequela 07/16/2021   Closed fracture of posterior wall of left acetabulum with routine healing 06/16/2021   Palpitations 03/19/2020   Diarrhea 03/19/2020   Premature atrial contractions 08/20/2018   Morbid (severe) obesity due to excess calories (HCC) 08/01/2018   Shortness of breath 07/26/2018   Chronic cough 03/22/2018   Uncomplicated type 2 diabetes mellitus (HCC) 03/22/2018   Hypertension associated with diabetes (HCC) 03/22/2018    Past Surgical History:  Procedure Laterality Date   CESAREAN SECTION     CHOLECYSTECTOMY     COLONOSCOPY WITH PROPOFOL N/A 12/10/2019   Procedure:  COLONOSCOPY WITH PROPOFOL;  Surgeon: Wyline Mood, MD;  Location[facility] ENDOSCOPY;  Service: Gastroenterology;  Laterality: N/A;   COLONOSCOPY WITH PROPOFOL N/A 12/24/2019   Procedure: COLONOSCOPY WITH PROPOFOL;  Surgeon: Wyline Mood, MD;  Location: [facility] ENDOSCOPY;  Service: Gastroenterology;  Laterality: N/A;   FRACTURE SURGERY      OB History   No obstetric history on file.      Home Medications    Prior to Admission medications   Medication Sig Start Date End Date Taking? Authorizing Provider  albuterol (PROVENTIL HFA;VENTOLIN HFA) 108 (90 Base) MCG/ACT inhaler Inhale 1-2 puffs into the lungs every 6 (six) hours as needed for wheezing or shortness of breath. 07/19/16   Hagler, Jami L, PA-C  amLODipine (NORVASC) 10 MG tablet Take 1 tablet (10 mg total) by mouth daily. 12/07/22   Larae Grooms, NP  empagliflozin (JARDIANCE) 10 MG TABS tablet Take 1 tablet (10 mg total) by mouth daily before breakfast. 12/07/22   Larae Grooms, NP  glipiZIDE (GLUCOTROL) 10 MG tablet Take 1 tablet (10 mg total) by mouth 2 (two) times daily before a meal. 12/07/22   Larae Grooms, NP  hydrochlorothiazide (HYDRODIURIL) 25 MG tablet Take 1 tablet (25 mg total) by mouth daily. 12/07/22   Larae Grooms, NP  hydrocortisone 2.5 % cream SMARTSIG:sparingly Topical Twice Daily 10/14/21   [provider]  metFORMIN (GLUCOPHAGE) 1000 MG tablet Take 1 tablet (1,000 mg total) by mouth 2 (two) times daily with a  meal. 12/07/22   Larae Grooms, NP  naproxen (NAPROSYN) 500 MG tablet Take 1 tablet (500 mg total) by mouth 2 (two) times daily with a meal. 02/24/23   Braedyn Kauk, DO  predniSONE (STERAPRED UNI-PAK 21 TAB) 10 MG (21) TBPK tablet Take by mouth daily. Take 6 tabs by mouth daily for 1, then 5 tabs for 1 day, then 4 tabs for 1 day, then 3 tabs for 1 day, then 2 tabs for 1 day, then 1 tab for 1 day. 03/22/23   Malone Admire, Seward Meth, DO  rosuvastatin (CRESTOR) 5 MG tablet Take 1 tablet (5 mg total) by  mouth daily. 12/07/22   Larae Grooms, NP  tirzepatide [facility]) 7.5 MG/0.5ML Pen Inject 7.5 mg into the skin once a week. 12/07/22   Larae Grooms, NP  tiZANidine (ZANAFLEX) 4 MG tablet Take 1 tablet (4 mg total) by mouth every 6 (six) hours as needed for muscle spasms. 02/24/23   Lovell Roe, Seward Meth, DO  valsartan (DIOVAN) 80 MG tablet Take 1 tablet (80 mg total) by mouth daily. 12/07/22   Larae Grooms, NP    Family History Family History  Problem Relation Age of Onset   Hypertension Mother    Diabetes Mother     Social History Social History   Tobacco Use   Smoking status: Never   Smokeless tobacco: Never  Vaping Use   Vaping status: Never Used  Substance Use Topics   Alcohol use: Never    Alcohol/week: 0.0 standard drinks of alcohol   Drug use: Never     Allergies   Patient has no known allergies.   Review of Systems Review of Systems: egative unless otherwise stated in HPI.      Physical Exam Triage Vital Signs ED Triage Vitals  Encounter Vitals Group     BP 03/22/23 1232 119/67     Systolic BP Percentile --      Diastolic BP Percentile --      Pulse Rate 03/22/23 1232 70     Resp --      Temp --      Temp Source 03/22/23 1232 Oral     SpO2 03/22/23 1232 98 %     Weight --      Height --      Head Circumference --      Peak Flow --      Pain Score 03/22/23 1231 10     Pain Loc --      Pain Education --      Exclude from Growth Chart --    No data found.  Updated Vital Signs BP 119/67 (BP Location: Left Arm)   Pulse 70   LMP 08/20/2015 (Approximate) Comment: denies preg  SpO2 98%   Visual Acuity Right Eye Distance:   Left Eye Distance:   Bilateral Distance:    Right Eye Near:   Left Eye Near:    Bilateral Near:     Physical Exam GEN: well appearing female in no acute distress  CVS: well perfused  RESP: speaking in full sentences without pause, no respiratory distress  MSK:  Lumbar spine: - Inspection: no gross deformity or  asymmetry, swelling or ecchymosis. No skin changes  - Palpation: No TTP over the spinous processes, right lumbar paraspinal muscle tenderness, no SI joint tenderness bilaterally, no piriformis tenderness - ROM: limited active ROM of the lumbar spine in flexion and extension - Strength: 5/5 strength of lower extremity in L4-S1 nerve root distributions b/l - Neuro: sensation intact in  the L4-S1 nerve root distribution b/l - Special testing: Negative straight leg raise - walking cane present  SKIN: warm, dry, no overly skin rash or erythema    UC Treatments / Results  Labs (all labs ordered are listed, but only abnormal results are displayed) Labs Reviewed - No data to display  EKG   Radiology No results found.   Procedures Procedures (including critical care time)  Medications Ordered in UC Medications  ketorolac (TORADOL) injection 30 mg (30 mg Intramuscular Given 03/22/23 1333)    Initial Impression / Assessment and Plan / UC Course  I have reviewed the triage vital signs and the nursing notes.  Pertinent labs & imaging results that were available during my care of the patient were reviewed by me and considered in my medical decision making (see chart for details).      Pt is a 57 y.o.  female with intermittent right low back pain that started 3-4 weeks ago but returned a few days ago.   Had lumbar plain films at her last UC visit therefore we will defer to them for now.  X-rays then showed 3 mm retrolisthesis at L4-L5.  She was given Toradol IM 30 mg today as this is greatly improved her pain in the past.  Patient to gradually return to normal activities, as tolerated and continue ordinary activities within the limits permitted by pain.  Explained to patient that a multimodal pain approach is best.  Advised patient to use the prescribed Naproxen sodium  and muscle relaxer  for pain relief.  Prednisone for additional pain relief.  Advised patient to avoid other NSAIDs while  taking Naprosyn. Tylenol and Lidocaine patches PRN for multimodal pain relief. Counseled patient on red flag symptoms and when to seek immediate care.  No red flags suggesting cauda equina syndrome or progressive major motor weakness. Patient to follow up with orthopedic provider if symptoms do not improve with conservative treatment.  Return and ED precautions given.    Discussed MDM, treatment plan and plan for follow-up with patient who agrees with plan.   Final Clinical Impressions(s) / UC Diagnoses   Final diagnoses:  Chronic right-sided low back pain without sciatica     Discharge Instructions      If medication was prescribed, stop by the pharmacy to pick up your prescriptions.  For your  pain, Take 1000 mg Tylenol 3 times a day, take muscle relaxer (Tizanidine) 2-3 times a day, take Naprosyn twice a day,  as needed for pain.  Apply warm compresses intermittently, as needed.  As pain recedes, begin normal activities slowly as tolerated.  Follow up with primary care provider or an orthopedic provider, if symptoms persist.   Watch for worsening symptoms such as an increasing weakness or loss of sensation, increasing pain and/or the loss of bladder or bowel function. Should any of these occur, go to the emergency department immediately.      ED Prescriptions     Medication Sig Dispense Auth. Provider   predniSONE (STERAPRED UNI-PAK 21 TAB) 10 MG (21) TBPK tablet Take by mouth daily. Take 6 tabs by mouth daily for 1, then 5 tabs for 1 day, then 4 tabs for 1 day, then 3 tabs for 1 day, then 2 tabs for 1 day, then 1 tab for 1 day. 21 tablet Katha Cabal, DO      PDMP not reviewed this encounter.    Katha Cabal, DO 03/22/23 1631

## 2023-05-12 ENCOUNTER — Telehealth: Payer: Self-pay | Admitting: Nurse Practitioner

## 2023-05-12 NOTE — Telephone Encounter (Signed)
Pt is calling in because she needs the form for another handicap placard that she can take to [facility]. Pt says she usually gets it from [facility] but she is no longer seeing that provider and was advised to get it from her PCP. Pt just needs the form. Pt is requesting a call back on when she can pick the form up.

## 2023-05-12 NOTE — Telephone Encounter (Signed)
Ok to start form for the patient?

## 2023-05-15 NOTE — Telephone Encounter (Signed)
Yes you can start the form.

## 2023-05-16 NOTE — Telephone Encounter (Signed)
Form signed. Copy placed in scan bin and original placed in bin for pick up. Called and notified patient that form was ready for her.

## 2023-05-16 NOTE — Telephone Encounter (Signed)
Form placed in providers folder for signature

## 2023-06-05 ENCOUNTER — Ambulatory Visit (INDEPENDENT_AMBULATORY_CARE_PROVIDER_SITE_OTHER): Payer: BLUE CROSS/BLUE SHIELD | Admitting: Podiatry

## 2023-06-05 DIAGNOSIS — Z91199 Patient's noncompliance with other medical treatment and regimen due to unspecified reason: Secondary | ICD-10-CM

## 2023-06-05 NOTE — Progress Notes (Signed)
1. No-show for appointment     

## 2023-06-08 ENCOUNTER — Ambulatory Visit: Payer: Medicaid Other | Admitting: Nurse Practitioner

## 2023-06-10 ENCOUNTER — Other Ambulatory Visit: Payer: Self-pay | Admitting: Nurse Practitioner

## 2023-06-12 NOTE — Telephone Encounter (Signed)
Requested Prescriptions  Pending Prescriptions Disp Refills   glipiZIDE (GLUCOTROL) 10 MG tablet [Pharmacy Med Name: glipiZIDE 10 MG Oral Tablet] 180 tablet 0    Sig: TAKE 1 TABLET BY MOUTH TWICE DAILY BEFORE A MEAL     Endocrinology:  Diabetes - Sulfonylureas Failed - 06/12/2023  4:27 PM      Failed - HBA1C is between 0 and 7.9 and within 180 days    HB A1C (BAYER DCA - WAIVED)  Date Value Ref Range Status  06/25/2020 8.7 (H) <7.0 % Final    Comment:                                          Diabetic Adult            <7.0                                       Healthy Adult        4.3 - 5.7                                                           (DCCT/NGSP) American Diabetes Association's Summary of Glycemic Recommendations for Adults with Diabetes: Hemoglobin A1c <7.0%. More stringent glycemic goals (A1c <6.0%) may further reduce complications at the cost of increased risk of hypoglycemia.    Hgb A1c MFr Bld  Date Value Ref Range Status  12/07/2022 6.1 (H) 4.8 - 5.6 % Final    Comment:             Prediabetes: 5.7 - 6.4          Diabetes: >6.4          Glycemic control for adults with diabetes: <7.0          Failed - Valid encounter within last 6 months    Recent Outpatient Visits           6 months ago Hypertension associated with diabetes [facility])   [facility] Larae Grooms, NP   8 months ago Acute streptococcal pharyngitis   [facility] [place], Sherran Needs, NP   9 months ago Annual physical exam   [facility] Larae Grooms, NP   10 months ago Hyperlipidemia associated with type 2 diabetes mellitus [facility])   [place] [facility] Larae Grooms, NP   10 months ago Forearm sprain, right, initial encounter   [facility] M[place], Oswaldo Conroy, PA-C       Future Appointments             In 2 weeks Larae Grooms, NP  [facility]            Passed - Cr in normal range and within 360 days    Creatinine, Ser  Date Value Ref Range Status  12/07/2022 0.80 0.57 - 1.00 mg/dL Final

## 2023-06-26 IMAGING — CR DG LUMBAR SPINE 2-3V
1 series · 3 of 3 positions shown · non-contrast
Comparison: MRI 09/28/2009

CLINICAL DATA: Fall, back pain

EXAM:
LUMBAR SPINE - 2-3 VIEW

[Series 1: dg lumbar spine 2-3 views · 0.14mm/px · 3 of 3 slices shown]
[im 1/3]
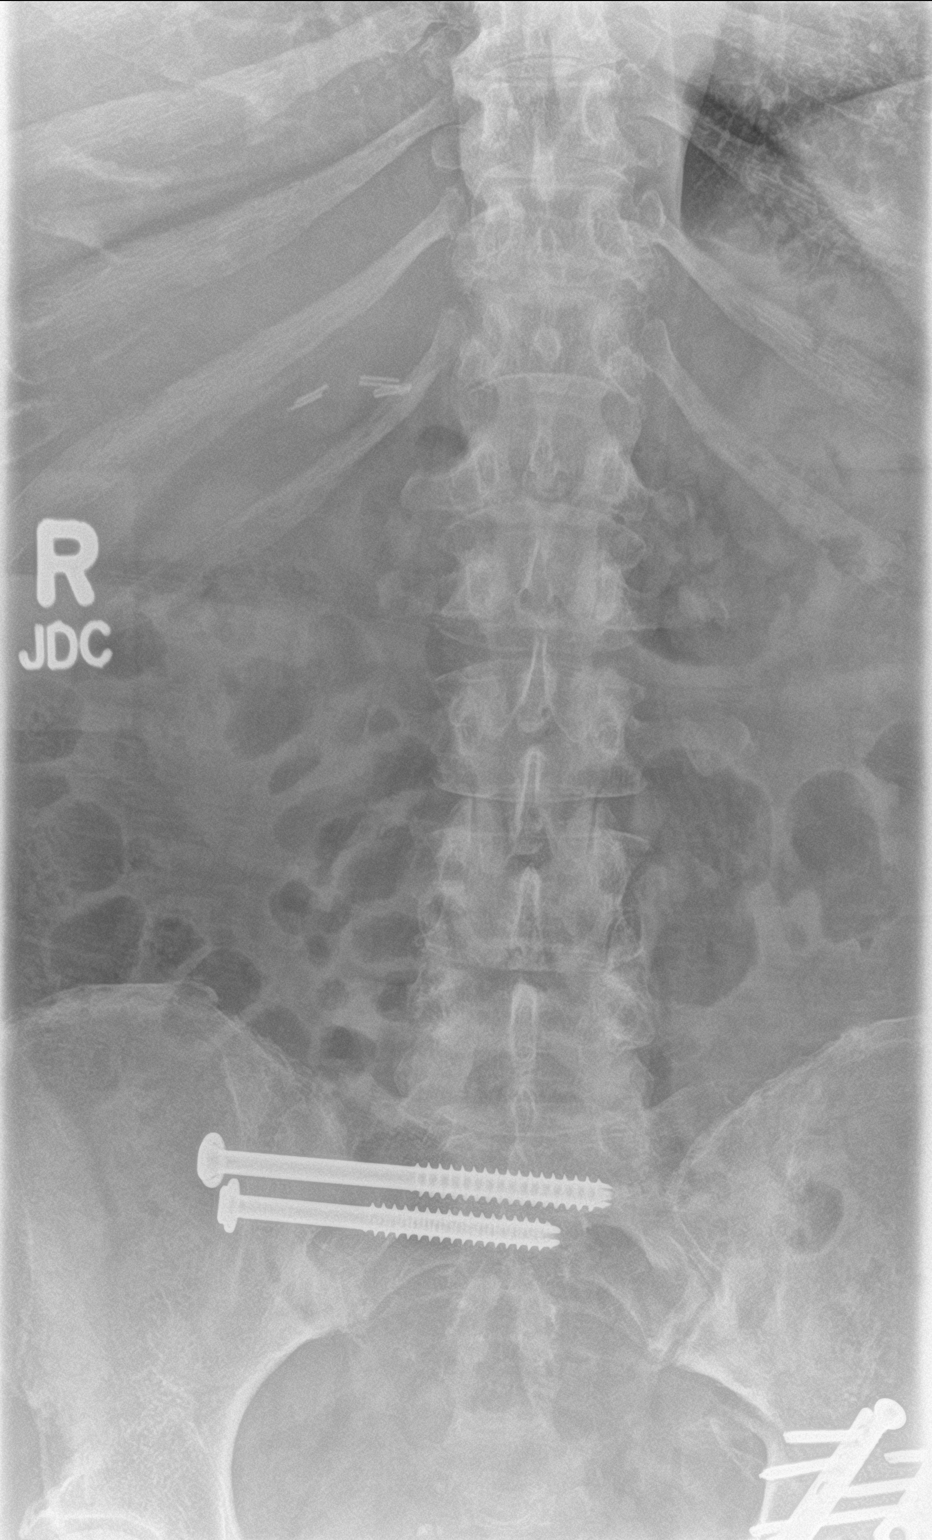
[im 2/3]
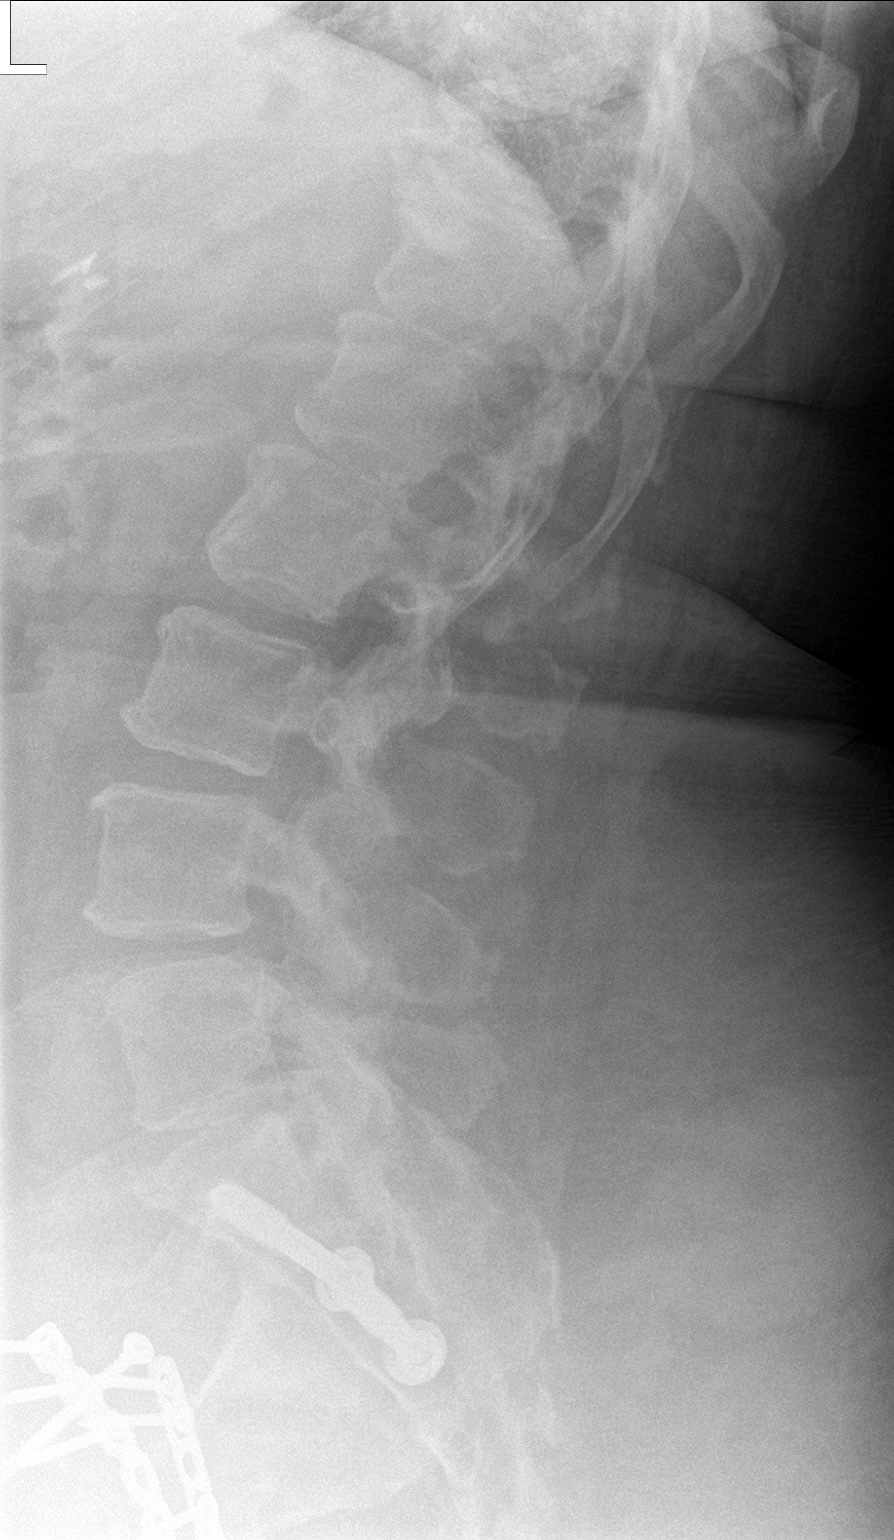
[im 3/3]
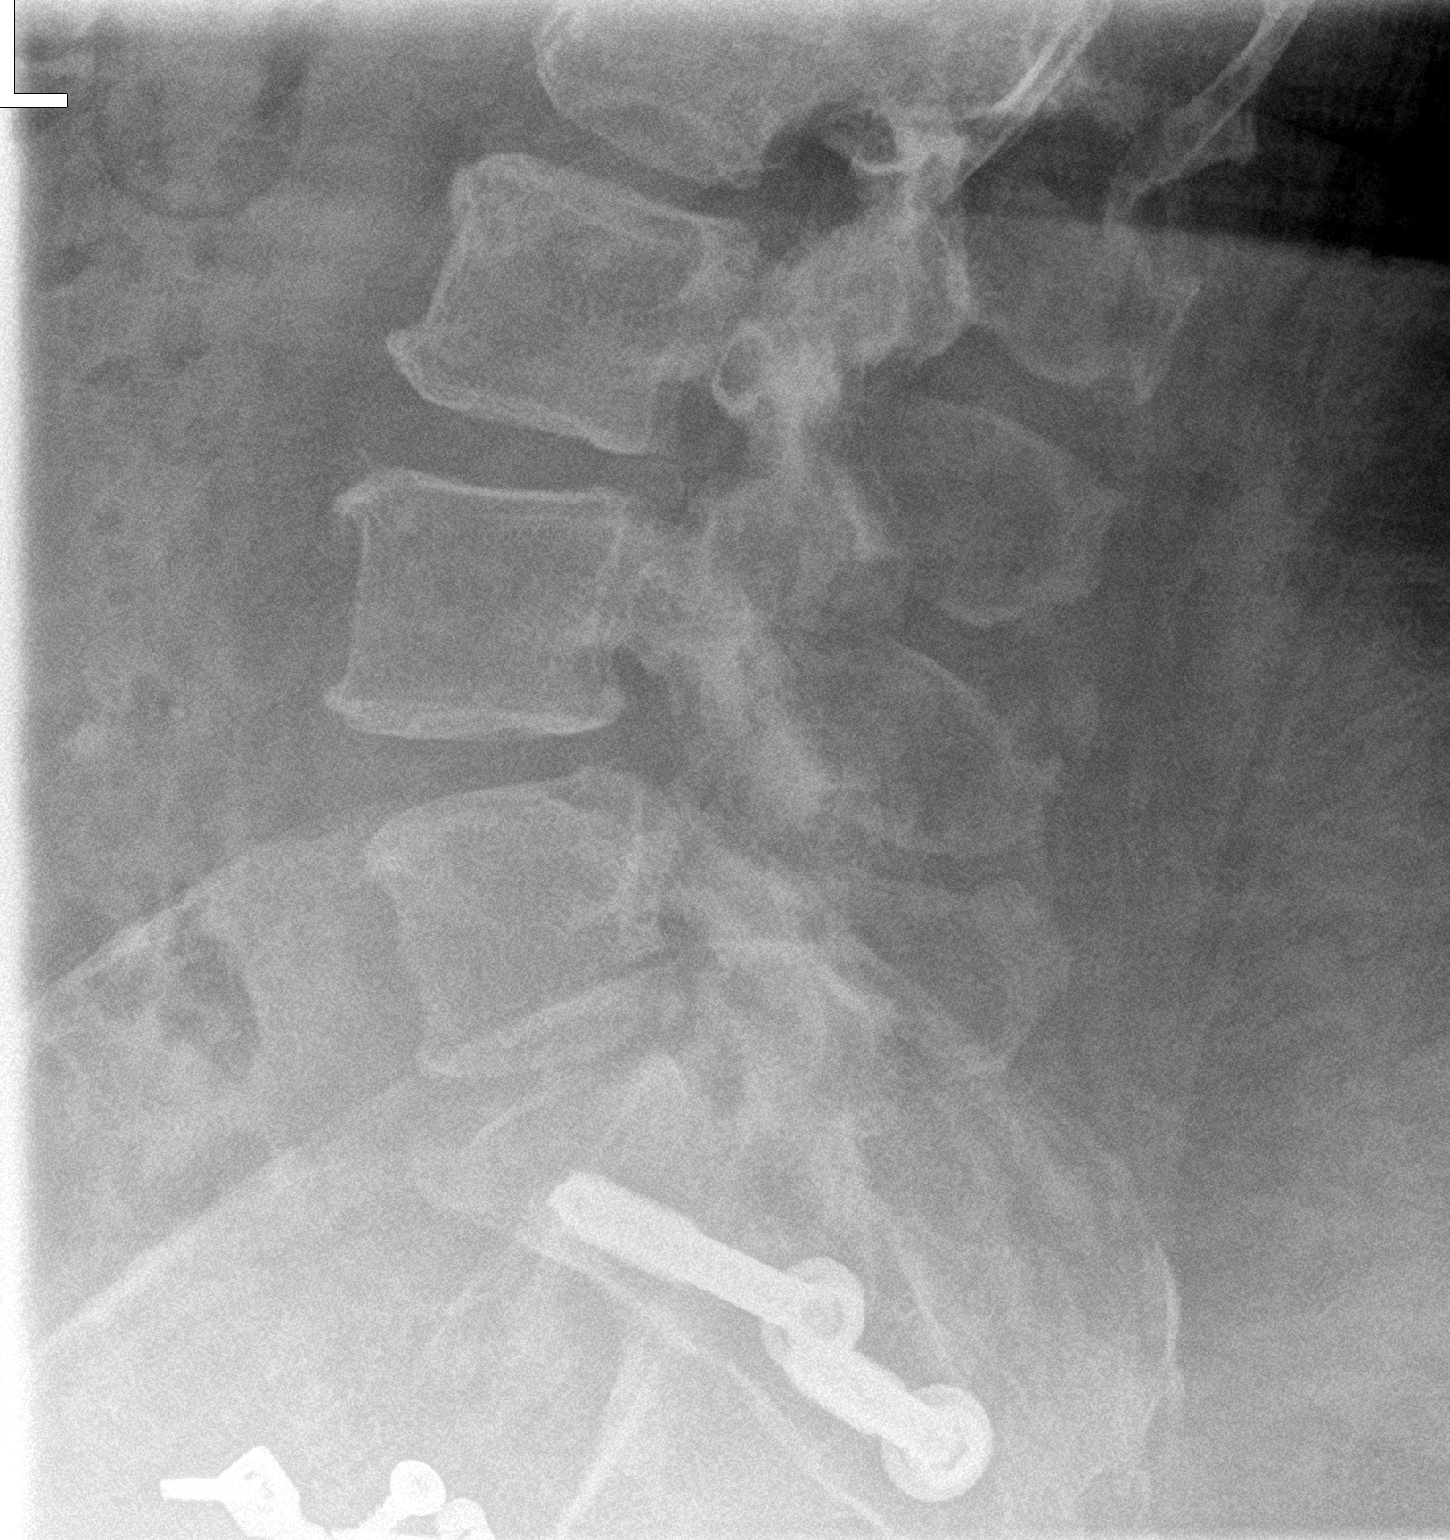

[3 of 3 positions shown; findings below may reference images not displayed]

FINDINGS: 4-5 mm retrolisthesis L5-S1, slightly progressive since prior
examination, possibly degenerative in nature. Otherwise normal
lumbar lordosis. No acute fracture of the lumbar spine. Vertebral
body height is preserved. Intervertebral disc space narrowing and
endplate remodeling at L4-5 and L5-S1 is in keeping with mild
degenerative disc disease at these levels. Paraspinal soft tissues
are unremarkable. Right sacroiliac arthrodesis has been performed
with 2 partially threaded screws.
IMPRESSION: No acute fracture or traumatic listhesis.

Degenerative changes L4-S1 with 5 mm retrolisthesis L5-S1.

## 2023-06-29 ENCOUNTER — Encounter: Payer: Self-pay | Admitting: Nurse Practitioner

## 2023-06-29 ENCOUNTER — Ambulatory Visit (INDEPENDENT_AMBULATORY_CARE_PROVIDER_SITE_OTHER): Payer: BLUE CROSS/BLUE SHIELD | Admitting: Nurse Practitioner

## 2023-06-29 ENCOUNTER — Ambulatory Visit: Payer: BLUE CROSS/BLUE SHIELD | Admitting: Podiatry

## 2023-06-29 VITALS — BP 131/83 | HR 89 | Temp 98.1°F | Ht 64.0 in | Wt 286.8 lb

## 2023-06-29 DIAGNOSIS — E1159 Type 2 diabetes mellitus with other circulatory complications: Secondary | ICD-10-CM | POA: Diagnosis not present

## 2023-06-29 DIAGNOSIS — Z7985 Long-term (current) use of injectable non-insulin antidiabetic drugs: Secondary | ICD-10-CM

## 2023-06-29 DIAGNOSIS — E119 Type 2 diabetes mellitus without complications: Secondary | ICD-10-CM

## 2023-06-29 DIAGNOSIS — E785 Hyperlipidemia, unspecified: Secondary | ICD-10-CM | POA: Diagnosis not present

## 2023-06-29 DIAGNOSIS — E1169 Type 2 diabetes mellitus with other specified complication: Secondary | ICD-10-CM | POA: Diagnosis not present

## 2023-06-29 DIAGNOSIS — I152 Hypertension secondary to endocrine disorders: Secondary | ICD-10-CM

## 2023-06-29 MED ORDER — VALSARTAN 80 MG PO TABS: 80.0000 mg | ORAL_TABLET | Freq: Every day | ORAL | 1 refills | Status: DC

## 2023-06-29 MED ORDER — AMLODIPINE BESYLATE 10 MG PO TABS: 10.0000 mg | ORAL_TABLET | Freq: Every day | ORAL | 1 refills | Status: DC

## 2023-06-29 MED ORDER — METFORMIN HCL 1000 MG PO TABS: 1000.0000 mg | ORAL_TABLET | Freq: Two times a day (BID) | ORAL | 1 refills | Status: DC

## 2023-06-29 MED ORDER — TIRZEPATIDE 10 MG/0.5ML ~~LOC~~ SOAJ: 10.0000 mg | SUBCUTANEOUS | 1 refills | Status: DC

## 2023-06-29 MED ORDER — HYDROCHLOROTHIAZIDE 25 MG PO TABS: 25.0000 mg | ORAL_TABLET | Freq: Every day | ORAL | 1 refills | Status: DC

## 2023-06-29 MED ORDER — EMPAGLIFLOZIN 10 MG PO TABS: 10.0000 mg | ORAL_TABLET | Freq: Every day | ORAL | 1 refills | Status: DC

## 2023-06-29 MED ORDER — ROSUVASTATIN CALCIUM 5 MG PO TABS: 5.0000 mg | ORAL_TABLET | Freq: Every day | ORAL | 1 refills | Status: DC

## 2023-06-29 NOTE — Assessment & Plan Note (Signed)
Chronic.  Controlled.  Continue with current medication regimen of Rosuvastatin daily.  Labs ordered today.  Return to clinic in 6 months for reevaluation.  Call sooner if concerns arise.   

## 2023-06-29 NOTE — Progress Notes (Signed)
 BP 131/83 (BP Location: Left Arm, Patient Position: Sitting, Cuff Size: Large)   Pulse 89   Temp 98.1 F (36.7 C) (Oral)   Ht 5' 4 (1.626 m)   Wt 286 lb 12.8 oz (130.1 kg)   LMP 08/20/2015 (Approximate) Comment: denies preg  SpO2 97%   BMI 49.23 kg/m    Subjective:    Patient ID: Yesenia Stevens, female    DOB: February 14, 1966, 58 y.o.   MRN: [id number]  HPI: Yesenia Stevens is a 58 y.o. female  Chief Complaint  Patient presents with   Diabetes   Hypertension   6 month follow up   HYPERTENSION / HYPERLIPIDEMIA Satisfied with current treatment? no Duration of hypertension: years BP monitoring frequency: not checking BP range:  BP medication side effects: no Past BP meds: valsartan , HCTZ, and Amlodipine  Duration of hyperlipidemia: years Cholesterol medication side effects: no Cholesterol supplements: none Past cholesterol medications: none Medication compliance: excellent compliance Aspirin : no Recent stressors: no Recurrent headaches: no Visual changes: no Palpitations: no Dyspnea: no Chest pain: no Lower extremity edema: no Dizzy/lightheaded: no   DIABETES Patient would like to increase the dose of Mounjaro .   Hypoglycemic episodes:no Polydipsia/polyuria: no Visual disturbance: no Chest pain: no Paresthesias: no Glucose Monitoring: no             Accucheck frequency: Not Checking             Fasting glucose:             Post prandial:             Evening:             Before meals: Taking Insulin?: no             Long acting insulin:             Short acting insulin: Blood Pressure Monitoring: not checking Retinal Examination: Not up to Date Foot Exam: Up to Date Diabetic Education: Not Completed Pneumovax: Not up to Date Influenza: Not up to Date Aspirin : no Relevant past medical, surgical, family and social history reviewed and updated as indicated. Interim medical history since our last visit reviewed. Allergies and medications reviewed and  updated.  Review of Systems  Eyes:  Negative for visual disturbance.  Respiratory:  Negative for cough, chest tightness and shortness of breath.   Cardiovascular:  Negative for chest pain, palpitations and leg swelling.  Endocrine: Negative for polydipsia and polyuria.  Neurological:  Negative for dizziness, numbness and headaches.    Per HPI unless specifically indicated above     Objective:    BP 131/83 (BP Location: Left Arm, Patient Position: Sitting, Cuff Size: Large)   Pulse 89   Temp 98.1 F (36.7 C) (Oral)   Ht 5' 4 (1.626 m)   Wt 286 lb 12.8 oz (130.1 kg)   LMP 08/20/2015 (Approximate) Comment: denies preg  SpO2 97%   BMI 49.23 kg/m   Wt Readings from Last 3 Encounters:  06/29/23 286 lb 12.8 oz (130.1 kg)  02/24/23 280 lb 13.9 oz (127.4 kg)  12/07/22 280 lb 12.8 oz (127.4 kg)    Physical Exam Vitals and nursing note reviewed.  Constitutional:      General: She is not in acute distress.    Appearance: Normal appearance. She is obese. She is not ill-appearing, toxic-appearing or diaphoretic.  HENT:     Head: Normocephalic.     Right Ear: External ear normal.     Left Ear: External ear  normal.     Nose: Nose normal.     Mouth/Throat:     Mouth: Mucous membranes are moist.     Pharynx: Oropharynx is clear.  Eyes:     General:        Right eye: No discharge.        Left eye: No discharge.     Extraocular Movements: Extraocular movements intact.     Conjunctiva/sclera: Conjunctivae normal.     Pupils: Pupils are equal, round, and reactive to light.  Cardiovascular:     Rate and Rhythm: Normal rate and regular rhythm.     Heart sounds: No murmur heard. Pulmonary:     Effort: Pulmonary effort is normal. No respiratory distress.     Breath sounds: Normal breath sounds. No wheezing or rales.  Musculoskeletal:     Cervical back: Normal range of motion and neck supple.     Comments: Uses a cane due left hip injury  Skin:    General: Skin is warm and dry.      Capillary Refill: Capillary refill takes less than 2 seconds.  Neurological:     General: No focal deficit present.     Mental Status: She is alert and oriented to person, place, and time. Mental status is at baseline.  Psychiatric:        Mood and Affect: Mood normal.        Behavior: Behavior normal.        Thought Content: Thought content normal.        Judgment: Judgment normal.     Results for orders placed or performed in visit on 03/03/23  HM DIABETES EYE EXAM   Collection Time: 03/01/23 12:00 AM  Result Value Ref Range   HM Diabetic Eye Exam Retinopathy (A) No Retinopathy      Assessment & Plan:   Problem List Items Addressed This Visit       Cardiovascular and Mediastinum   Hypertension associated with diabetes (HCC)   Chronic.  Controlled.  Continue with current medication regimen of Amlodipine , HCTZ and Valsartan  daily.  Refills sent today.  Labs ordered today.  Return to clinic in 6 months for reevaluation.  Call sooner if concerns arise.       Relevant Medications   tirzepatide  (MOUNJARO ) 10 MG/0.5ML Pen   amLODipine  (NORVASC ) 10 MG tablet   empagliflozin  (JARDIANCE ) 10 MG TABS tablet   hydrochlorothiazide  (HYDRODIURIL ) 25 MG tablet   metFORMIN  (GLUCOPHAGE ) 1000 MG tablet   rosuvastatin  (CRESTOR ) 5 MG tablet   valsartan  (DIOVAN ) 80 MG tablet   Other Relevant Orders   Comp Met (CMET)     Endocrine   Uncomplicated type 2 diabetes mellitus (HCC) - Primary   Chronic. Controlled.  Last A1c was 6.1%.  Increased Mounjaro  to 10mg , Metformin  and Jardiance .  Eye exam up to date.  Follow up in 6 months.  Call sooner if concerns arise.       Relevant Medications   tirzepatide  (MOUNJARO ) 10 MG/0.5ML Pen   empagliflozin  (JARDIANCE ) 10 MG TABS tablet   metFORMIN  (GLUCOPHAGE ) 1000 MG tablet   rosuvastatin  (CRESTOR ) 5 MG tablet   valsartan  (DIOVAN ) 80 MG tablet   Other Relevant Orders   HgB A1c   Hyperlipidemia associated with type 2 diabetes mellitus (HCC)    Chronic.  Controlled.  Continue with current medication regimen of Rosuvastatin  daily.  Labs ordered today.  Return to clinic in 6 months for reevaluation.  Call sooner if concerns arise.  Relevant Medications   tirzepatide  (MOUNJARO ) 10 MG/0.5ML Pen   amLODipine  (NORVASC ) 10 MG tablet   empagliflozin  (JARDIANCE ) 10 MG TABS tablet   hydrochlorothiazide  (HYDRODIURIL ) 25 MG tablet   metFORMIN  (GLUCOPHAGE ) 1000 MG tablet   rosuvastatin  (CRESTOR ) 5 MG tablet   valsartan  (DIOVAN ) 80 MG tablet   Other Relevant Orders   Lipid Profile     Other   Morbid (severe) obesity due to excess calories (HCC)   Recommended eating smaller high protein, low fat meals more frequently and exercising 30 mins a day 5 times a week with a goal of 10-15lb weight loss in the next 3 months.   Patient is working on weight loss.  Difficult for her to exercise due to hip injury.  Mounjaro  increased to 10mg .      Relevant Medications   tirzepatide  (MOUNJARO ) 10 MG/0.5ML Pen   empagliflozin  (JARDIANCE ) 10 MG TABS tablet   metFORMIN  (GLUCOPHAGE ) 1000 MG tablet     Follow up plan: Return in about 6 months (around 12/27/2023) for HTN, HLD, DM2 FU.

## 2023-06-29 NOTE — Assessment & Plan Note (Signed)
 Chronic. Controlled.  Last A1c was 6.1%.  Increased Mounjaro to 10mg , Metformin and Jardiance.  Eye exam up to date.  Follow up in 6 months.  Call sooner if concerns arise.

## 2023-06-29 NOTE — Assessment & Plan Note (Signed)
Chronic.  Controlled.  Continue with current medication regimen of Amlodipine, HCTZ and Valsartan daily.  Refills sent today.  Labs ordered today.  Return to clinic in 6 months for reevaluation.  Call sooner if concerns arise.

## 2023-06-29 NOTE — Assessment & Plan Note (Signed)
 Recommended eating smaller high protein, low fat meals more frequently and exercising 30 mins a day 5 times a week with a goal of 10-15lb weight loss in the next 3 months.   Patient is working on weight loss.  Difficult for her to exercise due to hip injury.  Mounjaro  increased to 10mg .

## 2023-06-30 ENCOUNTER — Telehealth: Payer: Self-pay

## 2023-06-30 LAB — HEMOGLOBIN A1C
Est. average glucose Bld gHb Est-mCnc: 131 mg/dL
Hgb A1c MFr Bld: 6.2 % — ABNORMAL HIGH (ref 4.8–5.6)

## 2023-06-30 LAB — COMPREHENSIVE METABOLIC PANEL
ALT: 11 [IU]/L (ref 0–32)
AST: 15 [IU]/L (ref 0–40)
Albumin: 4.2 g/dL (ref 3.8–4.9)
Alkaline Phosphatase: 81 [IU]/L (ref 44–121)
BUN/Creatinine Ratio: 24 — ABNORMAL HIGH (ref 9–23)
BUN: 21 mg/dL (ref 6–24)
Bilirubin Total: 0.4 mg/dL (ref 0.0–1.2)
CO2: 26 mmol/L (ref 20–29)
Calcium: 9.5 mg/dL (ref 8.7–10.2)
Chloride: 100 mmol/L (ref 96–106)
Creatinine, Ser: 0.87 mg/dL (ref 0.57–1.00)
Globulin, Total: 3.1 g/dL (ref 1.5–4.5)
Glucose: 123 mg/dL — ABNORMAL HIGH (ref 70–99)
Potassium: 4.1 mmol/L (ref 3.5–5.2)
Sodium: 141 mmol/L (ref 134–144)
Total Protein: 7.3 g/dL (ref 6.0–8.5)
eGFR: 79 mL/min/{1.73_m2} (ref >59)

## 2023-06-30 LAB — LIPID PANEL
Chol/HDL Ratio: 2.4 {ratio} (ref 0.0–4.4)
Cholesterol, Total: 132 mg/dL (ref 100–199)
HDL: 54 mg/dL (ref >39)
LDL Chol Calc (NIH): 66 mg/dL (ref 0–99)
Triglycerides: 53 mg/dL (ref 0–149)
VLDL Cholesterol Cal: 12 mg/dL (ref 5–40)

## 2023-06-30 NOTE — Telephone Encounter (Signed)
 Requested office visit has  been sent to faxed to sarah at the attached fax number.

## 2023-06-30 NOTE — Telephone Encounter (Signed)
 Received fax from [employer] requesting completion of LT disability forms.  Contacted [employer] and spoke with Lauraine and advised that our office does not complete LT disability forms. Lauraine stated that she would update their computer system and that we should not receive any more forms requesting completion.  Lauraine requested that the medical records for patient's office visit on 06/29/23 be faxed to her at [phone]. Verified that we have an authorization to release medical records to [employer] on file and agreed to have the medical records faxed as requested.

## 2023-09-14 ENCOUNTER — Other Ambulatory Visit: Payer: Self-pay | Admitting: Nurse Practitioner

## 2023-09-15 NOTE — Telephone Encounter (Signed)
 Requested Prescriptions  Pending Prescriptions Disp Refills   glipiZIDE (GLUCOTROL) 10 MG tablet [Pharmacy Med Name: glipiZIDE 10 MG Oral Tablet] 180 tablet 1    Sig: TAKE 1 TABLET BY MOUTH TWICE DAILY BEFORE A MEAL     Endocrinology:  Diabetes - Sulfonylureas Failed - 09/15/2023  1:42 PM      Failed - Valid encounter within last 6 months    Recent Outpatient Visits   None     Future Appointments             In 3 months Yesenia Grooms, NP [facility]            Passed - HBA1C is between 0 and 7.9 and within 180 days    HB A1C (BAYER DCA - WAIVED)  Date Value Ref Range Status  06/25/2020 8.7 (H) <7.0 % Final    Comment:                                          Diabetic Adult            <7.0                                       Healthy Adult        4.3 - 5.7                                                           (DCCT/NGSP) American Diabetes Association's Summary of Glycemic Recommendations for Adults with Diabetes: Hemoglobin A1c <7.0%. More stringent glycemic goals (A1c <6.0%) may further reduce complications at the cost of increased risk of hypoglycemia.    Hgb A1c MFr Bld  Date Value Ref Range Status  06/29/2023 6.2 (H) 4.8 - 5.6 % Final    Comment:             Prediabetes: 5.7 - 6.4          Diabetes: >6.4          Glycemic control for adults with diabetes: <7.0          Passed - Cr in normal range and within 360 days    Creatinine, Ser  Date Value Ref Range Status  06/29/2023 0.87 0.57 - 1.00 mg/dL Final

## 2023-10-31 ENCOUNTER — Ambulatory Visit: Payer: Self-pay

## 2023-10-31 NOTE — Telephone Encounter (Signed)
 Chief Complaint: congestion/sob Symptoms: mucus  Disposition: [] ED /[] Urgent Care (no appt availability in office) / [x] Appointment(In office/virtual)/ []  [facility]/ [] Home Care/ [] Refused Recommended Disposition /[] [facility] [facility]/ []  Follow-up with PCP Additional Notes: Pt complaining of mucus/cough/mild SOB over last 3 weeks. Pt states "its about this time every year I get sick. I think it is allergies that turns in to an infection. My snot is brown and sometimes if having a coughing attack I get SOB. I need my inhaler refilled." Pt denies fever/pain. Pt has been taking OTC to help but feels this is getting worse. Pt has soonest appt tomorrow at  1400. RN gave care advice/home care and to monitor SOB on when to seek emergent care. Pt verbalized understanding.         Copied from CRM [id number]. Topic: Clinical - Red Word Triage >> Oct 31, 2023  9:33 AM Ivette P wrote: [facility] that prompted transfer to Nurse Triage: mucus, turned to brownish color     shortness of breath, need a refill on inhaler Reason for Disposition  [1] Sinus congestion (pressure, fullness) AND [2] present > 10 days  Answer Assessment - Initial Assessment Questions 1. LOCATION: "Where does it hurt?"      No pain 2. ONSET: "When did the sinus pain start?"  (e.g., hours, days)      3 weeks  3. SEVERITY: "How bad is the pain?"   (Scale 1-10; mild, moderate or severe)   - MILD (1-3): doesn't interfere with normal activities    - MODERATE (4-7): interferes with normal activities (e.g., work or school) or awakens from sleep   - SEVERE (8-10): excruciating pain and patient unable to do any normal activities        Denies  4. RECURRENT SYMPTOM: "Have you ever had sinus problems before?" If Yes, ask: "When was the last time?" and "What happened that time?"      Yes yearly  5. NASAL CONGESTION: "Is the nose blocked?" If Yes, ask: "Can you open it or must you breathe through your mouth?"     At times   6. NASAL DISCHARGE: "Do you have discharge from your nose?" If so ask, "What color?"     Brown/bloody at times  7. FEVER: "Do you have a fever?" If Yes, ask: "What is it, how was it measured, and when did it start?"      Denies  8. OTHER SYMPTOMS: "Do you have any other symptoms?" (e.g., sore throat, cough, earache, difficulty breathing)     Cough, SOB, throat  Protocols used: Sinus Pain or Congestion-A-AH

## 2023-11-01 ENCOUNTER — Ambulatory Visit (INDEPENDENT_AMBULATORY_CARE_PROVIDER_SITE_OTHER): Admitting: Nurse Practitioner

## 2023-11-01 ENCOUNTER — Encounter: Payer: Self-pay | Admitting: Nurse Practitioner

## 2023-11-01 VITALS — BP 122/74 | HR 94 | Temp 98.6°F | Ht 64.0 in | Wt 282.4 lb

## 2023-11-01 DIAGNOSIS — J0110 Acute frontal sinusitis, unspecified: Secondary | ICD-10-CM

## 2023-11-01 DIAGNOSIS — J019 Acute sinusitis, unspecified: Secondary | ICD-10-CM | POA: Insufficient documentation

## 2023-11-01 MED ORDER — AMOXICILLIN-POT CLAVULANATE 875-125 MG PO TABS: 1.0000 | ORAL_TABLET | Freq: Two times a day (BID) | ORAL | 0 refills | Status: AC

## 2023-11-01 MED ORDER — HYDROCOD POLI-CHLORPHE POLI ER 10-8 MG/5ML PO SUER
5.0000 mL | Freq: Every evening | ORAL | 0 refills | Status: DC | PRN
Start: 2023-11-01 — End: 2023-11-17

## 2023-11-01 MED ORDER — ALBUTEROL SULFATE HFA 108 (90 BASE) MCG/ACT IN AERS: 2.0000 | INHALATION_SPRAY | Freq: Four times a day (QID) | RESPIRATORY_TRACT | 1 refills | Status: AC | PRN

## 2023-11-01 NOTE — Patient Instructions (Signed)

## 2023-11-01 NOTE — Progress Notes (Signed)
 BP 122/74   Pulse 94   Temp 98.6 F (37 C) (Oral)   Ht 5\' 4"  (1.626 m)   Wt 282 lb 6.4 oz (128.1 kg)   LMP 08/20/2015 (Approximate) Comment: denies preg  SpO2 95%   BMI 48.47 kg/m    Subjective:    Patient ID: Yesenia Stevens, female    DOB: 04-04-1966, 58 y.o.   MRN: [id number]  HPI: Yesenia Stevens is a 58 y.o. female  Chief Complaint  Patient presents with   Cough   UPPER RESPIRATORY TRACT INFECTION Presents today for cough an congestion for 2 weeks. Gets this every year. Non smoker. Fever: no Cough: yes Shortness of breath: yes Wheezing: no Chest pain: no Chest tightness: yes Chest congestion: yes Nasal congestion: yes Runny nose: yes Post nasal drip: yes Sneezing: no Sore throat: a little from all the coughing Swollen glands: no Sinus pressure: no Headache: no Face pain: no Toothache: no Ear pain: none Ear pressure: none Eyes red/itching:no Eye drainage/crusting: no  Vomiting: no Rash: no Fatigue: yes Sick contacts: no Strep contacts: no  Context: fluctuating Recurrent sinusitis: no Relief with OTC cold/cough medications: no  Treatments attempted: Elderberry gummy    Relevant past medical, surgical, family and social history reviewed and updated as indicated. Interim medical history since our last visit reviewed. Allergies and medications reviewed and updated.  Review of Systems  Constitutional:  Positive for fatigue. Negative for activity change, appetite change, chills and fever.  HENT:  Positive for congestion, postnasal drip, rhinorrhea and sore throat. Negative for ear discharge, ear pain, facial swelling, sinus pressure, sinus pain, sneezing and voice change.   Respiratory:  Positive for cough, chest tightness and shortness of breath. Negative for wheezing.   Cardiovascular:  Negative for chest pain, palpitations and leg swelling.  Gastrointestinal: Negative.   Endocrine: Negative.   Neurological:  Negative for dizziness, numbness and  headaches.  Psychiatric/Behavioral: Negative.      Per HPI unless specifically indicated above     Objective:     BP 122/74   Pulse 94   Temp 98.6 F (37 C) (Oral)   Ht 5\' 4"  (1.626 m)   Wt 282 lb 6.4 oz (128.1 kg)   LMP 08/20/2015 (Approximate) Comment: denies preg  SpO2 95%   BMI 48.47 kg/m   Wt Readings from Last 3 Encounters:  11/01/23 282 lb 6.4 oz (128.1 kg)  06/29/23 286 lb 12.8 oz (130.1 kg)  02/24/23 280 lb 13.9 oz (127.4 kg)    Physical Exam Vitals and nursing note reviewed.  Constitutional:      General: She is awake. She is not in acute distress.    Appearance: She is well-developed and well-groomed. She is obese. She is ill-appearing. She is not toxic-appearing.  HENT:     Head: Normocephalic.     Right Ear: Hearing, ear canal and external ear normal. A middle ear effusion is present. Tympanic membrane is not injected or perforated.     Left Ear: Hearing, ear canal and external ear normal. A middle ear effusion is present. Tympanic membrane is not injected or perforated.     Nose: Rhinorrhea present. Rhinorrhea is clear.     Right Sinus: No maxillary sinus tenderness or frontal sinus tenderness.     Left Sinus: No maxillary sinus tenderness or frontal sinus tenderness.     Mouth/Throat:     Mouth: Mucous membranes are moist.     Pharynx: Posterior oropharyngeal erythema (mild) present. No pharyngeal swelling or oropharyngeal  exudate.  Eyes:     General: Lids are normal.        Right eye: No discharge.        Left eye: No discharge.     Conjunctiva/sclera: Conjunctivae normal.     Pupils: Pupils are equal, round, and reactive to light.  Neck:     Thyroid: No thyromegaly.     Vascular: No carotid bruit.  Cardiovascular:     Rate and Rhythm: Normal rate and regular rhythm.     Heart sounds: Normal heart sounds. No murmur heard.    No gallop.  Pulmonary:     Effort: Pulmonary effort is normal. No accessory muscle usage or respiratory distress.      Breath sounds: Wheezing (intermittent expiratory noted throughout) present. No decreased breath sounds or rales.  Abdominal:     General: Bowel sounds are normal.     Palpations: Abdomen is soft. There is no hepatomegaly or splenomegaly.  Musculoskeletal:     Cervical back: Normal range of motion and neck supple.     Right lower leg: No edema.     Left lower leg: No edema.  Lymphadenopathy:     Head:     Right side of head: No submental, submandibular, tonsillar, preauricular or posterior auricular adenopathy.     Left side of head: No submental, submandibular, tonsillar, preauricular or posterior auricular adenopathy.     Cervical: No cervical adenopathy.  Skin:    General: Skin is warm and dry.  Neurological:     Mental Status: She is alert and oriented to person, place, and time.  Psychiatric:        Attention and Perception: Attention normal.        Mood and Affect: Mood normal.        Speech: Speech normal.        Behavior: Behavior normal. Behavior is cooperative.        Thought Content: Thought content normal.    Results for orders placed or performed in visit on 06/29/23  Comp Met (CMET)   Collection Time: 06/29/23 11:39 AM  Result Value Ref Range   Glucose 123 (H) 70 - 99 mg/dL   BUN 21 6 - 24 mg/dL   Creatinine, Ser 6.04 0.57 - 1.00 mg/dL   eGFR 79 >54 UJ/WJX/9.14   BUN/Creatinine Ratio 24 (H) 9 - 23   Sodium 141 134 - 144 mmol/L   Potassium 4.1 3.5 - 5.2 mmol/L   Chloride 100 96 - 106 mmol/L   CO2 26 20 - 29 mmol/L   Calcium  9.5 8.7 - 10.2 mg/dL   Total Protein 7.3 6.0 - 8.5 g/dL   Albumin 4.2 3.8 - 4.9 g/dL   Globulin, Total 3.1 1.5 - 4.5 g/dL   Bilirubin Total 0.4 0.0 - 1.2 mg/dL   Alkaline Phosphatase 81 44 - 121 IU/L   AST 15 0 - 40 IU/L   ALT 11 0 - 32 IU/L  Lipid Profile   Collection Time: 06/29/23 11:39 AM  Result Value Ref Range   Cholesterol, Total 132 100 - 199 mg/dL   Triglycerides 53 0 - 149 mg/dL   HDL 54 >78 mg/dL   VLDL Cholesterol Cal 12  5 - 40 mg/dL   LDL Chol Calc (NIH) 66 0 - 99 mg/dL   Chol/HDL Ratio 2.4 0.0 - 4.4 ratio  HgB A1c   Collection Time: 06/29/23 11:39 AM  Result Value Ref Range   Hgb A1c MFr Bld 6.2 (H) 4.8 - 5.6 %  Est. average glucose Bld gHb Est-mCnc 131 mg/dL      Assessment & Plan:   Problem List Items Addressed This Visit       Respiratory   Acute sinusitis - Primary   Acute with symptoms for 2 weeks and not improving.  Will start Augmentin BID for 7 days and Albuterol  inhaler to use as needed for SOB or wheezing + Tussionex to use at night for cough.  Recommend she take Zyrtec OTC daily for next 7 days and then next year recommend she start taking this daily in March until about the beginning of June to help prevent infections as suspect allergies bring these on yearly.  Recommend: - Increased rest - Increasing Fluids - Acetaminophen as needed for fever/pain.  - Salt water gargling, chloraseptic spray and throat lozenges - Mucinex.  - Humidifying the air.       Relevant Medications   amoxicillin -clavulanate (AUGMENTIN) 875-125 MG tablet   chlorpheniramine-HYDROcodone (TUSSIONEX) 10-8 MG/5ML     Follow up plan: Return if symptoms worsen or fail to improve.

## 2023-11-01 NOTE — Assessment & Plan Note (Signed)
 Acute with symptoms for 2 weeks and not improving.  Will start Augmentin BID for 7 days and Albuterol  inhaler to use as needed for SOB or wheezing + Tussionex to use at night for cough.  Recommend she take Zyrtec OTC daily for next 7 days and then next year recommend she start taking this daily in March until about the beginning of June to help prevent infections as suspect allergies bring these on yearly.  Recommend: - Increased rest - Increasing Fluids - Acetaminophen as needed for fever/pain.  - Salt water gargling, chloraseptic spray and throat lozenges - Mucinex.  - Humidifying the air.

## 2023-11-15 ENCOUNTER — Ambulatory Visit: Payer: Self-pay

## 2023-11-15 NOTE — Telephone Encounter (Signed)
 Copied from CRM [id number]. Topic: Clinical - Red Word Triage >> Nov 15, 2023  3:38 PM Ethelle Herb L wrote: Red Word that prompted transfer to Nurse Triage: Irritated in vaginal area, feels scratch like from rectum to vaginal area - painful  Chief Complaint: vaginal itching to rectum; states feels raw Symptoms: see above Frequency: for about a week Pertinent Negatives: Patient denies fever, burning with urination Disposition: [] ED /[] Urgent Care (no appt availability in office) / [x] Appointment(In office/virtual)/ []  [place] [place] [facility]/ [] Home Care/ [] Refused Recommended Disposition /[] [facility] [facility]/ []  Follow-up with PCP Additional Notes: apt scheduled for tomorrow; care advice given, denies questions; instructed to go to ER if becomes worse.   Reason for Disposition  MODERATE-SEVERE itching (i.e., interferes with school, work, or sleep)  Answer Assessment - Initial Assessment Questions 1. SYMPTOM: "What's the main symptom you're concerned about?" (e.g., pain, itching, dryness)     Itching and pain in the vaginal area 2. LOCATION: "Where is the  itching located?" (e.g., inside/outside, left/right)     outside 3. ONSET: "When did the  itching  start?"     Last week 4. PAIN: "Is there any pain?" If Yes, ask: "How bad is it?" (Scale: 1-10; mild, moderate, severe)   -  MILD (1-3): Doesn't interfere with normal activities.    -  MODERATE (4-7): Interferes with normal activities (e.g., work or school) or awakens from sleep.     -  SEVERE (8-10): Excruciating pain, unable to do any normal activities.     5/10 5. ITCHING: "Is there any itching?" If Yes, ask: "How bad is it?" (Scale: 1-10; mild, moderate, severe)     mild 6. CAUSE: "What do you think is causing the discharge?" "Have you had the same problem before? What happened then?"     yes 7. OTHER SYMPTOMS: "Do you have any other symptoms?" (e.g., fever, itching, vaginal bleeding, pain with urination, injury to genital area, vaginal  foreign body)     no 8. PREGNANCY: "Is there any chance you are pregnant?" "When was your last menstrual period?"     na  Protocols used: Vaginal Symptoms-A-AH

## 2023-11-17 ENCOUNTER — Ambulatory Visit (INDEPENDENT_AMBULATORY_CARE_PROVIDER_SITE_OTHER): Admitting: Family Medicine

## 2023-11-17 ENCOUNTER — Other Ambulatory Visit (HOSPITAL_COMMUNITY): Payer: Self-pay

## 2023-11-17 ENCOUNTER — Encounter: Payer: Self-pay | Admitting: Family Medicine

## 2023-11-17 ENCOUNTER — Telehealth: Payer: Self-pay

## 2023-11-17 VITALS — BP 108/72 | HR 112 | Temp 100.0°F | Ht 64.0 in | Wt 279.0 lb

## 2023-11-17 DIAGNOSIS — B3731 Acute candidiasis of vulva and vagina: Secondary | ICD-10-CM

## 2023-11-17 DIAGNOSIS — E119 Type 2 diabetes mellitus without complications: Secondary | ICD-10-CM | POA: Diagnosis not present

## 2023-11-17 DIAGNOSIS — Z7985 Long-term (current) use of injectable non-insulin antidiabetic drugs: Secondary | ICD-10-CM | POA: Diagnosis not present

## 2023-11-17 DIAGNOSIS — E1169 Type 2 diabetes mellitus with other specified complication: Secondary | ICD-10-CM | POA: Diagnosis not present

## 2023-11-17 DIAGNOSIS — N898 Other specified noninflammatory disorders of vagina: Secondary | ICD-10-CM | POA: Diagnosis not present

## 2023-11-17 LAB — MICROALBUMIN, URINE WAIVED
Creatinine, Urine Waived: 100 mg/dL (ref 10–300)
Microalb, Ur Waived: 30 mg/L — ABNORMAL HIGH (ref 0–19)
Microalb/Creat Ratio: <30 mg/g (ref <30)

## 2023-11-17 LAB — WET PREP FOR TRICH, YEAST, CLUE
Clue Cell Exam: NEGATIVE
Trichomonas Exam: NEGATIVE
Yeast Exam: POSITIVE — AB

## 2023-11-17 LAB — URINALYSIS, ROUTINE W REFLEX MICROSCOPIC
Bilirubin, UA: NEGATIVE
Leukocytes,UA: NEGATIVE
Nitrite, UA: NEGATIVE
Protein,UA: NEGATIVE
Specific Gravity, UA: <1.005 — ABNORMAL LOW (ref 1.005–1.030)
Urobilinogen, Ur: 0.2 mg/dL (ref 0.2–1.0)
pH, UA: 5.5 (ref 5.0–7.5)

## 2023-11-17 LAB — MICROSCOPIC EXAMINATION: WBC, UA: NONE SEEN /HPF (ref 0–5)

## 2023-11-17 LAB — BAYER DCA HB A1C WAIVED: HB A1C (BAYER DCA - WAIVED): 5.7 % — ABNORMAL HIGH (ref 4.8–5.6)

## 2023-11-17 MED ORDER — FLUCONAZOLE 100 MG PO TABS: 100.0000 mg | ORAL_TABLET | Freq: Every day | ORAL | 0 refills | Status: DC

## 2023-11-17 NOTE — Telephone Encounter (Signed)
 Pharmacy Patient Advocate Encounter   Received notification from CoverMyMeds that prior authorization for Jardiance  10MG  tabletsis required/requested.   Insurance verification completed.   The patient is insured through [place] [employer] .   Per test claim: PA required; PA submitted to above mentioned insurance via CoverMyMeds Key/confirmation #/EOC (Key: [id number])   Status is pending

## 2023-11-17 NOTE — Assessment & Plan Note (Signed)
 Doing great with A1c of 5.7. Given yeast infection- if continues consider stopping jardiance  and increasing moujaro. Call with any concerns. Follow up with PCP in 6 weeks as scheduled.

## 2023-11-17 NOTE — Progress Notes (Signed)
 BP 108/72 (BP Location: Left Arm, Patient Position: Sitting, Cuff Size: Large)   Pulse (!) 112   Temp 100 F (37.8 C) (Oral)   Ht 5\' 4"  (1.626 m)   Wt 279 lb (126.6 kg)   LMP 08/20/2015 (Approximate) Comment: denies preg  SpO2 96%   BMI 47.89 kg/m    Subjective:    Patient ID: Hermann Look, female    DOB: 07-Mar-1966, 58 y.o.   MRN: [id number]  HPI: Etheline Geppert is a 58 y.o. female  Chief Complaint  Patient presents with   Vaginal Discharge    Onset about 2 weeks ago. Vaginal discharge, pain. Denies odor    Rectal Pain    Pt states that she feels like its a knot on her rectum. Started about the same time as the vaginal symptoms.    VAGINAL DISCHARGE Duration: about 2 weeks Discharge description: mucous  Pruritus: yes Dysuria: yes Malodorous: no Urinary frequency: no Fevers: no Abdominal pain: no  Sexual activity: No concerned about STIs History of sexually transmitted diseases: no Recent antibiotic use: yes- about 2 weeks ago Context: worse  Treatments attempted: none  DIABETES Hypoglycemic episodes:no Polydipsia/polyuria: no Visual disturbance: no Chest pain: no Paresthesias: no Glucose Monitoring: no Taking Insulin?: no Blood Pressure Monitoring: rarely Retinal Examination: Up to Date Foot Exam: Not up to Date Diabetic Education: Completed Pneumovax: Not up to Date Influenza: Up to Date Aspirin : no  Relevant past medical, surgical, family and social history reviewed and updated as indicated. Interim medical history since our last visit reviewed. Allergies and medications reviewed and updated.  Review of Systems  Constitutional: Negative.   Respiratory: Negative.    Cardiovascular: Negative.   Genitourinary:  Positive for vaginal discharge. Negative for decreased urine volume, difficulty urinating, dyspareunia, dysuria, enuresis, flank pain, frequency, genital sores, hematuria, menstrual problem, pelvic pain, urgency, vaginal bleeding and  vaginal pain.  Musculoskeletal: Negative.   Neurological: Negative.   Psychiatric/Behavioral: Negative.      Per HPI unless specifically indicated above     Objective:     BP 108/72 (BP Location: Left Arm, Patient Position: Sitting, Cuff Size: Large)   Pulse (!) 112   Temp 100 F (37.8 C) (Oral)   Ht 5\' 4"  (1.626 m)   Wt 279 lb (126.6 kg)   LMP 08/20/2015 (Approximate) Comment: denies preg  SpO2 96%   BMI 47.89 kg/m   Wt Readings from Last 3 Encounters:  11/17/23 279 lb (126.6 kg)  11/01/23 282 lb 6.4 oz (128.1 kg)  06/29/23 286 lb 12.8 oz (130.1 kg)    Physical Exam Vitals and nursing note reviewed.  Constitutional:      General: She is not in acute distress.    Appearance: Normal appearance. She is obese. She is not ill-appearing, toxic-appearing or diaphoretic.  HENT:     Head: Normocephalic and atraumatic.     Right Ear: External ear normal.     Left Ear: External ear normal.     Nose: Nose normal.     Mouth/Throat:     Mouth: Mucous membranes are moist.     Pharynx: Oropharynx is clear.  Eyes:     General: No scleral icterus.       Right eye: No discharge.        Left eye: No discharge.     Extraocular Movements: Extraocular movements intact.     Conjunctiva/sclera: Conjunctivae normal.     Pupils: Pupils are equal, round, and reactive to light.  Cardiovascular:  Rate and Rhythm: Normal rate and regular rhythm.     Pulses: Normal pulses.     Heart sounds: Normal heart sounds. No murmur heard.    No friction rub. No gallop.  Pulmonary:     Effort: Pulmonary effort is normal. No respiratory distress.     Breath sounds: Normal breath sounds. No stridor. No wheezing, rhonchi or rales.  Chest:     Chest wall: No tenderness.  Musculoskeletal:        General: Normal range of motion.     Cervical back: Normal range of motion and neck supple.  Skin:    General: Skin is warm and dry.     Capillary Refill: Capillary refill takes less than 2 seconds.      Coloration: Skin is not jaundiced or pale.     Findings: No bruising, erythema, lesion or rash.  Neurological:     General: No focal deficit present.     Mental Status: She is alert and oriented to person, place, and time. Mental status is at baseline.  Psychiatric:        Mood and Affect: Mood normal.        Behavior: Behavior normal.        Thought Content: Thought content normal.        Judgment: Judgment normal.     Results for orders placed or performed in visit on 06/29/23  Comp Met (CMET)   Collection Time: 06/29/23 11:39 AM  Result Value Ref Range   Glucose 123 (H) 70 - 99 mg/dL   BUN 21 6 - 24 mg/dL   Creatinine, Ser 0.45 0.57 - 1.00 mg/dL   eGFR 79 >40 JW/JXB/1.47   BUN/Creatinine Ratio 24 (H) 9 - 23   Sodium 141 134 - 144 mmol/L   Potassium 4.1 3.5 - 5.2 mmol/L   Chloride 100 96 - 106 mmol/L   CO2 26 20 - 29 mmol/L   Calcium  9.5 8.7 - 10.2 mg/dL   Total Protein 7.3 6.0 - 8.5 g/dL   Albumin 4.2 3.8 - 4.9 g/dL   Globulin, Total 3.1 1.5 - 4.5 g/dL   Bilirubin Total 0.4 0.0 - 1.2 mg/dL   Alkaline Phosphatase 81 44 - 121 IU/L   AST 15 0 - 40 IU/L   ALT 11 0 - 32 IU/L  Lipid Profile   Collection Time: 06/29/23 11:39 AM  Result Value Ref Range   Cholesterol, Total 132 100 - 199 mg/dL   Triglycerides 53 0 - 149 mg/dL   HDL 54 >82 mg/dL   VLDL Cholesterol Cal 12 5 - 40 mg/dL   LDL Chol Calc (NIH) 66 0 - 99 mg/dL   Chol/HDL Ratio 2.4 0.0 - 4.4 ratio  HgB A1c   Collection Time: 06/29/23 11:39 AM  Result Value Ref Range   Hgb A1c MFr Bld 6.2 (H) 4.8 - 5.6 %   Est. average glucose Bld gHb Est-mCnc 131 mg/dL      Assessment & Plan:   Problem List Items Addressed This Visit       Endocrine   Uncomplicated type 2 diabetes mellitus (HCC)   Doing great with A1c of 5.7. Given yeast infection- if continues consider stopping jardiance  and increasing moujaro. Call with any concerns. Follow up with PCP in 6 weeks as scheduled.       Relevant Orders   Bayer DCA Hb A1c  Waived   Microalbumin, Urine Waived   Other Visit Diagnoses       Yeast vaginitis    -  Primary   Will treat with 1 week of diflucan due to severity. Call with any concerns or if not getting better.   Relevant Medications   fluconazole (DIFLUCAN) 100 MG tablet     Vaginal discharge       + yeast   Relevant Orders   WET PREP FOR TRICH, YEAST, CLUE   Urinalysis, Routine w reflex microscopic        Follow up plan: Return for As scheduled with PCP.

## 2023-11-20 ENCOUNTER — Ambulatory Visit: Payer: Self-pay

## 2023-11-20 ENCOUNTER — Telehealth: Payer: Self-pay

## 2023-11-20 NOTE — Telephone Encounter (Signed)
 Copied from CRM [id number]. Topic: Clinical - Medical Advice >> Nov 20, 2023  3:13 PM Shardie S wrote: Reason for CRM: Patient has medical question, transferred to NT  Chief Complaint: vaginal s/s Symptoms: vaginal itching at times, burning with urination due to urine flowing over areas that have been scratched Frequency: few days Pertinent Negatives: Patient denies vaginal bleeding Disposition: [] ED /[] Urgent Care (no appt availability in office) / [] Appointment(In office/virtual)/ []  [facility]/ [] Home Care/ [] Refused Recommended Disposition /[] [place] [facility]/ [x]  Follow-up with PCP Additional Notes: pt stated LOV for yeast infection and is currently taking medication  given by PCP.  Pt states vagina still itching a lot and patient is trying not to scratch - pt states unable wear under clothes due to vagina is swollen, irritated and states scratching so much that when pt urinates her vagina is burning states " feels like edward scissor hands is down there cutting her up".  Pt would like to know what PCP recommends for this and if she needs Rx - please call pt    Reason for Disposition . Mild vaginal itching    Severe itching and now burns to urinate due to scratching area  Answer Assessment - Initial Assessment Questions 1. SYMPTOM: "What's the main symptom you're concerned about?" (e.g., pain, itching, dryness)     Itching , pain/ burning with urination due to scratching so much 2. LOCATION: "Where is the  located?" (e.g., inside/outside, left/right)     vagina 3. ONSET: "When did the    start?"     Few days 4. PAIN: "Is there any pain?" If Yes, ask: "How bad is it?" (Scale: 1-10; mild, moderate, severe)   -  MILD (1-3): Doesn't interfere with normal activities.    -  MODERATE (4-7): Interferes with normal activities (e.g., work or school) or awakens from sleep.     -  SEVERE (8-10): Excruciating pain, unable to do any normal activities.     Moderate  5. ITCHING: "Is  there any itching?" If Yes, ask: "How bad is it?" (Scale: 1-10; mild, moderate, severe)     moderate 6. CAUSE: "What do you think is causing the discharge?" "Have you had the same problem before? What happened then?"     Yeast infection 7. OTHER SYMPTOMS: "Do you have any other symptoms?" (e.g., fever, itching, vaginal bleeding, pain with urination, injury to genital area, vaginal foreign body)     Itching, burning 8. PREGNANCY: "Is there any chance you are pregnant?" "When was your last menstrual period?"     N/a  Protocols used: Vaginal Symptoms-A-AH

## 2023-11-20 NOTE — Telephone Encounter (Signed)
Called and LVM asking for patient to please return my call.  

## 2023-11-20 NOTE — Telephone Encounter (Signed)
 Copied from CRM [id number]. Topic: General - Other >> Nov 20, 2023  1:39 PM Fonda T wrote: Reason for CRM: Patient calling, requesting only to speak to a nurse in regards to her last visit on 11/17/23.  Attempted to assist patient with questions, she declined and gave no further information.   Please contact patient at [phone]

## 2023-11-21 ENCOUNTER — Other Ambulatory Visit (HOSPITAL_COMMUNITY): Payer: Self-pay

## 2023-11-21 NOTE — Telephone Encounter (Signed)
 Pharmacy Patient Advocate Encounter  Received notification from  [place] [employer] that Prior Authorization for Jardiance  10MG  tablets has been APPROVED from 5.16.25 to 5.30.26. Ran test claim, Copay is $RTS. RX WAS LAST FILLED ON 61.25 . This test claim was processed through [facility]- copay amounts may vary at other pharmacies due to pharmacy/plan contracts, or as the patient moves through the different stages of their insurance plan.   PA #/Case ID/Reference #:  (Key: [id number])

## 2023-11-22 NOTE — Telephone Encounter (Signed)
 Called and LVM asking for patient to please return my call if she still has questions.

## 2023-11-28 ENCOUNTER — Other Ambulatory Visit (HOSPITAL_COMMUNITY): Payer: Self-pay

## 2023-11-28 ENCOUNTER — Telehealth: Payer: Self-pay

## 2023-11-28 NOTE — Telephone Encounter (Signed)
 Pharmacy Patient Advocate Encounter   Received notification from CoverMyMeds that prior authorization for Mounjaro  10MG /0.5ML auto-injectors is required/requested.   Insurance verification completed.   The patient is insured through [place] [employer] .   Per test claim:  Byetta, Victoza, Ozempic, and Trulicity is preferred by the insurance.  If suggested medication is appropriate, Please send in a new RX and discontinue this one. If not, please advise as to why it's not appropriate so that we may request a Prior Authorization. Please note, some preferred medications may still require a PA.  If the suggested medications have not been trialed and there are no contraindications to their use, the PA will not be submitted, as it will not be approved.

## 2023-11-29 MED ORDER — OZEMPIC (0.25 OR 0.5 MG/DOSE) 2 MG/3ML ~~LOC~~ SOPN: 0.2500 mg | PEN_INJECTOR | SUBCUTANEOUS | 1 refills | Status: DC

## 2023-11-29 NOTE — Addendum Note (Signed)
 Addended by: Aileen Alexanders on: 11/29/2023 01:39 PM   Modules accepted: Orders

## 2023-11-29 NOTE — Telephone Encounter (Signed)
 Medication sent to the pharmacy.

## 2023-11-29 NOTE — Telephone Encounter (Signed)
 Please let patient know that her insurance does not want to cover her Mounjaro  until she has tried Ozempic of Trulicity.  Ask her if she is okay with taking Ozempic and I will send it in for her.

## 2023-11-29 NOTE — Telephone Encounter (Signed)
 Called and spoke to patient. She is ok with taking the Ozempic and would like it sent to [facility].

## 2023-12-01 ENCOUNTER — Other Ambulatory Visit (HOSPITAL_COMMUNITY): Payer: Self-pay

## 2023-12-01 ENCOUNTER — Telehealth: Payer: Self-pay | Admitting: Pharmacy Technician

## 2023-12-01 NOTE — Telephone Encounter (Signed)
 Pharmacy Patient Advocate Encounter  Received notification from [place] [employer] that Prior Authorization for Ozempic  (0.25 or 0.5 MG/DOSE) 2MG /3ML pen-injectors has been APPROVED from 12/01/23 to 11/30/24. Ran test claim, Copay is $4.00. This test claim was processed through [facility]- copay amounts may vary at other pharmacies due to pharmacy/plan contracts, or as the patient moves through the different stages of their insurance plan.   PA #/Case ID/Reference #: [id number]

## 2023-12-01 NOTE — Telephone Encounter (Signed)
 Pharmacy Patient Advocate Encounter   Received notification from [employer] that prior authorization for Ozempic  (0.25 or 0.5 MG/DOSE) 2MG /3ML pen-injectors is required/requested.   Insurance verification completed.   The patient is insured through [place] [employer] .   Per test claim: PA required; PA submitted to above mentioned insurance via CoverMyMeds Key/confirmation #/EOC [facility] Status is pending

## 2023-12-29 ENCOUNTER — Ambulatory Visit (INDEPENDENT_AMBULATORY_CARE_PROVIDER_SITE_OTHER): Payer: Self-pay | Admitting: Nurse Practitioner

## 2023-12-29 ENCOUNTER — Encounter: Payer: Self-pay | Admitting: Nurse Practitioner

## 2023-12-29 DIAGNOSIS — I152 Hypertension secondary to endocrine disorders: Secondary | ICD-10-CM

## 2023-12-29 DIAGNOSIS — E1169 Type 2 diabetes mellitus with other specified complication: Secondary | ICD-10-CM

## 2023-12-29 DIAGNOSIS — E119 Type 2 diabetes mellitus without complications: Secondary | ICD-10-CM

## 2023-12-29 DIAGNOSIS — E785 Hyperlipidemia, unspecified: Secondary | ICD-10-CM

## 2023-12-29 DIAGNOSIS — E1159 Type 2 diabetes mellitus with other circulatory complications: Secondary | ICD-10-CM

## 2023-12-29 DIAGNOSIS — Z7985 Long-term (current) use of injectable non-insulin antidiabetic drugs: Secondary | ICD-10-CM

## 2023-12-29 DIAGNOSIS — Z7984 Long term (current) use of oral hypoglycemic drugs: Secondary | ICD-10-CM

## 2023-12-29 MED ORDER — METFORMIN HCL 1000 MG PO TABS: 1000.0000 mg | ORAL_TABLET | Freq: Two times a day (BID) | ORAL | 1 refills | Status: AC

## 2023-12-29 MED ORDER — OZEMPIC (0.25 OR 0.5 MG/DOSE) 2 MG/3ML ~~LOC~~ SOPN: 0.5000 mg | PEN_INJECTOR | SUBCUTANEOUS | 1 refills | Status: DC

## 2023-12-29 MED ORDER — ROSUVASTATIN CALCIUM 5 MG PO TABS: 5.0000 mg | ORAL_TABLET | Freq: Every day | ORAL | 1 refills | Status: AC

## 2023-12-29 MED ORDER — EMPAGLIFLOZIN 10 MG PO TABS: 10.0000 mg | ORAL_TABLET | Freq: Every day | ORAL | 1 refills | Status: AC

## 2023-12-29 MED ORDER — HYDROCORTISONE 2.5 % EX CREA: TOPICAL_CREAM | Freq: Two times a day (BID) | CUTANEOUS | 1 refills | Status: AC

## 2023-12-29 MED ORDER — HYDROCHLOROTHIAZIDE 25 MG PO TABS: 25.0000 mg | ORAL_TABLET | Freq: Every day | ORAL | 1 refills | Status: DC

## 2023-12-29 MED ORDER — GLIPIZIDE 10 MG PO TABS: 10.0000 mg | ORAL_TABLET | Freq: Two times a day (BID) | ORAL | 1 refills | Status: DC

## 2023-12-29 MED ORDER — VALSARTAN 80 MG PO TABS: 80.0000 mg | ORAL_TABLET | Freq: Every day | ORAL | 1 refills | Status: AC

## 2023-12-29 MED ORDER — AMLODIPINE BESYLATE 10 MG PO TABS: 10.0000 mg | ORAL_TABLET | Freq: Every day | ORAL | 1 refills | Status: AC

## 2023-12-29 NOTE — Progress Notes (Signed)
 BP 123/76   Pulse 86   Temp 97.9 F (36.6 C) (Oral)   Ht 5' 4 (1.626 m)   Wt 285 lb 3.2 oz (129.4 kg)   LMP 08/20/2015 (Approximate) Comment: denies preg  SpO2 97%   BMI 48.95 kg/m    Subjective:    Patient ID: Yesenia Stevens, female    DOB: August 25, 1965, 58 y.o.   MRN: [id number]  HPI: Yesenia Stevens is a 58 y.o. female  Chief Complaint  Patient presents with   Diabetes   HYPERTENSION / HYPERLIPIDEMIA Satisfied with current treatment? no Duration of hypertension: years BP monitoring frequency: not checking BP range:  BP medication side effects: no Past BP meds: valsartan , HCTZ, and Amlodipine  Duration of hyperlipidemia: years Cholesterol medication side effects: no Cholesterol supplements: none Past cholesterol medications: none Medication compliance: excellent compliance Aspirin : no Recent stressors: no Recurrent headaches: no Visual changes: no Palpitations: no Dyspnea: no Chest pain: no Lower extremity edema: no Dizzy/lightheaded: no   DIABETES Patient would like to change back to mounjaro .  She is having difficulty with nausea from the Ozempic .  She previously tolerated Mounjaro  well.  Also having difficulty with using the needle associated with the Ozempic  pen.  It is very difficult for her to put the needle on and often requires her to use more than 1 needle. Hypoglycemic episodes:no Polydipsia/polyuria: no Visual disturbance: no Chest pain: no Paresthesias: no Glucose Monitoring: no             Accucheck frequency: Not Checking             Fasting glucose:             Post prandial:             Evening:             Before meals: Taking Insulin?: no             Long acting insulin:             Short acting insulin: Blood Pressure Monitoring: not checking Retinal Examination: Not up to Date Foot Exam: Up to Date Diabetic Education: Not Completed Pneumovax: Not up to Date Influenza: Not up to Date Aspirin : no   Relevant past medical,  surgical, family and social history reviewed and updated as indicated. Interim medical history since our last visit reviewed. Allergies and medications reviewed and updated.  Review of Systems  Eyes:  Negative for visual disturbance.  Respiratory:  Negative for cough, chest tightness and shortness of breath.   Cardiovascular:  Negative for chest pain, palpitations and leg swelling.  Endocrine: Negative for polydipsia and polyuria.  Neurological:  Negative for dizziness, numbness and headaches.    Per HPI unless specifically indicated above     Objective:    BP 123/76   Pulse 86   Temp 97.9 F (36.6 C) (Oral)   Ht 5' 4 (1.626 m)   Wt 285 lb 3.2 oz (129.4 kg)   LMP 08/20/2015 (Approximate) Comment: denies preg  SpO2 97%   BMI 48.95 kg/m   Wt Readings from Last 3 Encounters:  12/29/23 285 lb 3.2 oz (129.4 kg)  11/17/23 279 lb (126.6 kg)  11/01/23 282 lb 6.4 oz (128.1 kg)    Physical Exam Vitals and nursing note reviewed.  Constitutional:      General: She is not in acute distress.    Appearance: Normal appearance. She is obese. She is not ill-appearing, toxic-appearing or diaphoretic.  HENT:  Head: Normocephalic.     Right Ear: External ear normal.     Left Ear: External ear normal.     Nose: Nose normal.     Mouth/Throat:     Mouth: Mucous membranes are moist.     Pharynx: Oropharynx is clear.  Eyes:     General:        Right eye: No discharge.        Left eye: No discharge.     Extraocular Movements: Extraocular movements intact.     Conjunctiva/sclera: Conjunctivae normal.     Pupils: Pupils are equal, round, and reactive to light.  Cardiovascular:     Rate and Rhythm: Normal rate and regular rhythm.     Heart sounds: No murmur heard. Pulmonary:     Effort: Pulmonary effort is normal. No respiratory distress.     Breath sounds: Normal breath sounds. No wheezing or rales.  Musculoskeletal:     Cervical back: Normal range of motion and neck supple.      Comments: Uses a cane due left hip injury  Skin:    General: Skin is warm and dry.     Capillary Refill: Capillary refill takes less than 2 seconds.  Neurological:     General: No focal deficit present.     Mental Status: She is alert and oriented to person, place, and time. Mental status is at baseline.  Psychiatric:        Mood and Affect: Mood normal.        Behavior: Behavior normal.        Thought Content: Thought content normal.        Judgment: Judgment normal.     Results for orders placed or performed in visit on 11/17/23  WET PREP FOR TRICH, YEAST, CLUE   Collection Time: 11/17/23 10:31 AM   Specimen: Urine   Urine  Result Value Ref Range   Trichomonas Exam Negative Negative   Yeast Exam Positive (A) Negative   Clue Cell Exam Negative Negative  Microscopic Examination   Collection Time: 11/17/23 10:31 AM   Urine  Result Value Ref Range   WBC, UA None seen 0 - 5 /hpf   RBC, Urine 0-2 0 - 2 /hpf   Epithelial Cells (non renal) >10E 0 - 10 /hpf   Bacteria, UA Few (A) None seen/Few   Yeast, UA Present (A) None seen  Urinalysis, Routine w reflex microscopic   Collection Time: 11/17/23 10:31 AM  Result Value Ref Range   Specific Gravity, UA <1.005 (L) 1.005 - 1.030   pH, UA 5.5 5.0 - 7.5   Color, UA Yellow Yellow   Appearance Ur Cloudy (A) Clear   Leukocytes,UA Negative Negative   Protein,UA Negative Negative/Trace   Glucose, UA 3+ (A) Negative   Ketones, UA Trace (A) Negative   RBC, UA Trace (A) Negative   Bilirubin, UA Negative Negative   Urobilinogen, Ur 0.2 0.2 - 1.0 mg/dL   Nitrite, UA Negative Negative   Microscopic Examination See below:   Bayer DCA Hb A1c Waived   Collection Time: 11/17/23 11:01 AM  Result Value Ref Range   HB A1C (BAYER DCA - WAIVED) 5.7 (H) 4.8 - 5.6 %  Microalbumin, Urine Waived   Collection Time: 11/17/23 11:01 AM  Result Value Ref Range   Microalb, Ur Waived 30 (H) 0 - 19 mg/L   Creatinine, Urine Waived 100 10 - 300 mg/dL    Microalb/Creat Ratio <30 <30 mg/g  Assessment & Plan:   Problem List Items Addressed This Visit       Cardiovascular and Mediastinum   Hypertension associated with diabetes (HCC)   Chronic.  Controlled.  Continue with current medication regimen of Amlodipine , HCTZ and Valsartan  daily.  Refills sent today.  Labs ordered today.  Return to clinic in 6 months for reevaluation.  Call sooner if concerns arise.       Relevant Medications   Semaglutide ,0.25 or 0.5MG /DOS, (OZEMPIC , 0.25 OR 0.5 MG/DOSE,) 2 MG/3ML SOPN   amLODipine  (NORVASC ) 10 MG tablet   empagliflozin  (JARDIANCE ) 10 MG TABS tablet   glipiZIDE  (GLUCOTROL ) 10 MG tablet   hydrochlorothiazide  (HYDRODIURIL ) 25 MG tablet   metFORMIN  (GLUCOPHAGE ) 1000 MG tablet   rosuvastatin  (CRESTOR ) 5 MG tablet   valsartan  (DIOVAN ) 80 MG tablet   Other Relevant Orders   Comprehensive metabolic panel with GFR     Endocrine   Diabetes mellitus treated with oral medication (HCC)   Doing great with A1c of 5.7. Currently on Ozempic  0.25mg .  Having nausea and difficulty with needle.  Will attempt to change back to Mounjaro  which patient has tolerated better in the past.  Looking into insurance denial in the past to see if Mounjaro  will be approved.  Labs ordered today.  Return to clinic in 6 months for reevaluation.  Call sooner if concerns arise.        Relevant Medications   Semaglutide ,0.25 or 0.5MG /DOS, (OZEMPIC , 0.25 OR 0.5 MG/DOSE,) 2 MG/3ML SOPN   empagliflozin  (JARDIANCE ) 10 MG TABS tablet   glipiZIDE  (GLUCOTROL ) 10 MG tablet   metFORMIN  (GLUCOPHAGE ) 1000 MG tablet   rosuvastatin  (CRESTOR ) 5 MG tablet   valsartan  (DIOVAN ) 80 MG tablet   Hyperlipidemia associated with type 2 diabetes mellitus (HCC)   Chronic.  Controlled.  Continue with current medication regimen of Rosuvastatin  daily.  Labs ordered today.  Return to clinic in 6 months for reevaluation.  Call sooner if concerns arise.        Relevant Medications   Semaglutide ,0.25  or 0.5MG /DOS, (OZEMPIC , 0.25 OR 0.5 MG/DOSE,) 2 MG/3ML SOPN   amLODipine  (NORVASC ) 10 MG tablet   empagliflozin  (JARDIANCE ) 10 MG TABS tablet   glipiZIDE  (GLUCOTROL ) 10 MG tablet   hydrochlorothiazide  (HYDRODIURIL ) 25 MG tablet   metFORMIN  (GLUCOPHAGE ) 1000 MG tablet   rosuvastatin  (CRESTOR ) 5 MG tablet   valsartan  (DIOVAN ) 80 MG tablet   Other Relevant Orders   Lipid panel     Other   Morbid (severe) obesity due to excess calories (HCC) - Primary   Recommended eating smaller high protein, low fat meals more frequently and exercising 30 mins a day 5 times a week with a goal of 10-15lb weight loss in the next 3 months. On Ozempic .  Planning to increase the dose.       Relevant Medications   Semaglutide ,0.25 or 0.5MG /DOS, (OZEMPIC , 0.25 OR 0.5 MG/DOSE,) 2 MG/3ML SOPN   empagliflozin  (JARDIANCE ) 10 MG TABS tablet   glipiZIDE  (GLUCOTROL ) 10 MG tablet   metFORMIN  (GLUCOPHAGE ) 1000 MG tablet   Other Visit Diagnoses       Diabetes mellitus treated with injections of non-insulin medication (HCC)       Relevant Medications   Semaglutide ,0.25 or 0.5MG /DOS, (OZEMPIC , 0.25 OR 0.5 MG/DOSE,) 2 MG/3ML SOPN   empagliflozin  (JARDIANCE ) 10 MG TABS tablet   glipiZIDE  (GLUCOTROL ) 10 MG tablet   metFORMIN  (GLUCOPHAGE ) 1000 MG tablet   rosuvastatin  (CRESTOR ) 5 MG tablet   valsartan  (DIOVAN ) 80 MG tablet   Other Relevant Orders  Hemoglobin A1c        Follow up plan: Return in about 6 months (around 06/30/2024) for Physical and Fasting labs.

## 2023-12-29 NOTE — Assessment & Plan Note (Signed)
 Recommended eating smaller high protein, low fat meals more frequently and exercising 30 mins a day 5 times a week with a goal of 10-15lb weight loss in the next 3 months. On Ozempic .  Planning to increase the dose.

## 2023-12-29 NOTE — Assessment & Plan Note (Signed)
 Doing great with A1c of 5.7. Currently on Ozempic  0.25mg .  Having nausea and difficulty with needle.  Will attempt to change back to Mounjaro  which patient has tolerated better in the past.  Looking into insurance denial in the past to see if Mounjaro  will be approved.  Labs ordered today.  Return to clinic in 6 months for reevaluation.  Call sooner if concerns arise.

## 2023-12-29 NOTE — Assessment & Plan Note (Signed)
Chronic.  Controlled.  Continue with current medication regimen of Amlodipine, HCTZ and Valsartan daily.  Refills sent today.  Labs ordered today.  Return to clinic in 6 months for reevaluation.  Call sooner if concerns arise.

## 2023-12-29 NOTE — Assessment & Plan Note (Signed)
Chronic.  Controlled.  Continue with current medication regimen of Rosuvastatin daily.  Labs ordered today.  Return to clinic in 6 months for reevaluation.  Call sooner if concerns arise.   

## 2023-12-30 LAB — COMPREHENSIVE METABOLIC PANEL WITH GFR
ALT: 14 IU/L (ref 0–32)
AST: 18 IU/L (ref 0–40)
Albumin: 4 g/dL (ref 3.8–4.9)
Alkaline Phosphatase: 80 IU/L (ref 44–121)
BUN/Creatinine Ratio: 16 (ref 9–23)
BUN: 15 mg/dL (ref 6–24)
Bilirubin Total: 0.3 mg/dL (ref 0.0–1.2)
CO2: 23 mmol/L (ref 20–29)
Calcium: 9.3 mg/dL (ref 8.7–10.2)
Chloride: 102 mmol/L (ref 96–106)
Creatinine, Ser: 0.92 mg/dL (ref 0.57–1.00)
Globulin, Total: 3 g/dL (ref 1.5–4.5)
Glucose: 158 mg/dL — ABNORMAL HIGH (ref 70–99)
Potassium: 4.6 mmol/L (ref 3.5–5.2)
Sodium: 140 mmol/L (ref 134–144)
Total Protein: 7 g/dL (ref 6.0–8.5)
eGFR: 73 mL/min/1.73 (ref >59)

## 2023-12-30 LAB — LIPID PANEL
Chol/HDL Ratio: 3 ratio (ref 0.0–4.4)
Cholesterol, Total: 173 mg/dL (ref 100–199)
HDL: 57 mg/dL (ref >39)
LDL Chol Calc (NIH): 102 mg/dL — ABNORMAL HIGH (ref 0–99)
Triglycerides: 73 mg/dL (ref 0–149)
VLDL Cholesterol Cal: 14 mg/dL (ref 5–40)

## 2023-12-30 LAB — HEMOGLOBIN A1C
Est. average glucose Bld gHb Est-mCnc: 140 mg/dL
Hgb A1c MFr Bld: 6.5 % — ABNORMAL HIGH (ref 4.8–5.6)

## 2024-01-01 ENCOUNTER — Telehealth: Payer: Self-pay | Admitting: Pharmacy Technician

## 2024-01-01 ENCOUNTER — Ambulatory Visit: Payer: Self-pay | Admitting: Nurse Practitioner

## 2024-01-01 NOTE — Telephone Encounter (Signed)
 Byetta, Victoza, Ozempic , and Trulicity is preferred by the insurance.  Per previous request, I see that the pt must fail at least 2 of the preferred medications before [employer] will cover Mounjaro . I do not see anything in her chart about the Other 3. If I have overlooked it, please forgive me and let me know which one she also has tried so that we may request the PA for Mounjaro . Also what strength will be ordered. Thanks.

## 2024-01-01 NOTE — Telephone Encounter (Signed)
-----   Message from [facility] sent at 12/29/2023  2:43 PM EDT ----- Can we reach out to the patient's insurance please ----- Message ----- From: Melvin Pao, NP Sent: 12/29/2023  11:29 AM EDT To: [facility]Clinical  Can we call patient's insurance company and see if they will cover Mounjaro  due to having side effects with Ozempic ?  I didn't see a denial for her Mounjaro  in media to see if that was the case.  Pao

## 2024-01-05 NOTE — Telephone Encounter (Signed)
 Discussed with patient. She would like to proceed with Trulicity at this time.

## 2024-01-05 NOTE — Telephone Encounter (Signed)
 Please let patient know that she has to fail two medications prior to being able to get the Mounjaro .  We can continue with Ozempic  or switch to Trulicity for the next 3 months, see how she does then request the change to Mounjaro  after that.

## 2024-01-08 ENCOUNTER — Other Ambulatory Visit: Payer: Self-pay | Admitting: Nurse Practitioner

## 2024-01-08 DIAGNOSIS — E119 Type 2 diabetes mellitus without complications: Secondary | ICD-10-CM

## 2024-01-08 MED ORDER — TRULICITY 0.75 MG/0.5ML ~~LOC~~ SOAJ: 0.7500 mg | SUBCUTANEOUS | 0 refills | Status: DC

## 2024-01-10 NOTE — Telephone Encounter (Signed)
 Requested medication (s) are due for refill today - no  Requested medication (s) are on the active medication list -yes  Future visit scheduled -yes  Last refill: 01/08/24  Notes to clinic:   Pharmacy comment: Please provide the diagnosis indicated for this prescription. diagnosis must be documented on rx for ins please.    Requested Prescriptions  Pending Prescriptions Disp Refills   TRULICITY  0.75 MG/0.5ML SOAJ [Pharmacy Med Name: TRULICITY  0.75/0.5  INJ] 12 mL 0    Sig: INJECT 0.75MG  SUBCUTANEOUSLY ONCE A WEEK     Endocrinology:  Diabetes - GLP-1 Receptor Agonists Passed - 01/10/2024  1:09 PM      Passed - HBA1C is between 0 and 7.9 and within 180 days    HB A1C (BAYER DCA - WAIVED)  Date Value Ref Range Status  11/17/2023 5.7 (H) 4.8 - 5.6 % Final    Comment:             Prediabetes: 5.7 - 6.4          Diabetes: >6.4          Glycemic control for adults with diabetes: <7.0    Hgb A1c MFr Bld  Date Value Ref Range Status  12/29/2023 6.5 (H) 4.8 - 5.6 % Final    Comment:             Prediabetes: 5.7 - 6.4          Diabetes: >6.4          Glycemic control for adults with diabetes: <7.0          Passed - Valid encounter within last 6 months    Recent Outpatient Visits           1 week ago Morbid (severe) obesity due to excess calories (HCC)   [place] [facility] Melvin Pao, NP   1 month ago Yeast vaginitis   [place] [facility] [place], Megan P, DO   2 months ago Acute non-recurrent frontal sinusitis    [facility] [place], Melanie T, NP                 Requested Prescriptions  Pending Prescriptions Disp Refills   TRULICITY  0.75 MG/0.5ML SOAJ [Pharmacy Med Name: TRULICITY  0.75/0.5  INJ] 12 mL 0    Sig: INJECT 0.75MG  SUBCUTANEOUSLY ONCE A WEEK     Endocrinology:  Diabetes - GLP-1 Receptor Agonists Passed - 01/10/2024  1:09 PM      Passed - HBA1C is between 0 and 7.9 and within 180 days     HB A1C (BAYER DCA - WAIVED)  Date Value Ref Range Status  11/17/2023 5.7 (H) 4.8 - 5.6 % Final    Comment:             Prediabetes: 5.7 - 6.4          Diabetes: >6.4          Glycemic control for adults with diabetes: <7.0    Hgb A1c MFr Bld  Date Value Ref Range Status  12/29/2023 6.5 (H) 4.8 - 5.6 % Final    Comment:             Prediabetes: 5.7 - 6.4          Diabetes: >6.4          Glycemic control for adults with diabetes: <7.0          Passed - Valid encounter within last 6 months  Recent Outpatient Visits           1 week ago Morbid (severe) obesity due to excess calories [facility])   [facility] Melvin Pao, NP   1 month ago Yeast vaginitis   [place] [facility] [place], Megan P, DO   2 months ago Acute non-recurrent frontal sinusitis   [place] [facility] [place], Melanie DASEN, NP

## 2024-02-29 ENCOUNTER — Telehealth: Payer: Self-pay

## 2024-02-29 ENCOUNTER — Other Ambulatory Visit (HOSPITAL_COMMUNITY): Payer: Self-pay

## 2024-02-29 NOTE — Telephone Encounter (Signed)
 Pharmacy Patient Advocate Encounter   Received notification from Onbase that prior authorization for Trulicity  0.75mg /0.51ml Pen is required/requested.   Insurance verification completed.   The patient is insured through [employer] .   Per test claim: Refill too soon. PA is not needed at this time. Medication was filled 02/22/2024. Next eligible fill date is 03/15/2024.

## 2024-04-20 ENCOUNTER — Other Ambulatory Visit: Payer: Self-pay | Admitting: Nurse Practitioner

## 2024-04-20 DIAGNOSIS — E119 Type 2 diabetes mellitus without complications: Secondary | ICD-10-CM

## 2024-04-22 NOTE — Telephone Encounter (Signed)
 Requested Prescriptions  Pending Prescriptions Disp Refills   Dulaglutide  (TRULICITY ) 0.75 MG/0.5ML SOAJ [Pharmacy Med Name: Trulicity  0.75 MG/0.5ML Subcutaneous Solution Auto-injector] 6 mL 0    Sig: INJECTY ONE SYRINGEFUL SUBCUTANEOUSLY ONCE WEEKLY     Endocrinology:  Diabetes - GLP-1 Receptor Agonists Passed - 04/22/2024  2:24 PM      Passed - HBA1C is between 0 and 7.9 and within 180 days    HB A1C (BAYER DCA - WAIVED)  Date Value Ref Range Status  11/17/2023 5.7 (H) 4.8 - 5.6 % Final    Comment:             Prediabetes: 5.7 - 6.4          Diabetes: >6.4          Glycemic control for adults with diabetes: <7.0    Hgb A1c MFr Bld  Date Value Ref Range Status  12/29/2023 6.5 (H) 4.8 - 5.6 % Final    Comment:             Prediabetes: 5.7 - 6.4          Diabetes: >6.4          Glycemic control for adults with diabetes: <7.0          Passed - Valid encounter within last 6 months    Recent Outpatient Visits           3 months ago Morbid (severe) obesity due to excess calories [facility])   [place] [facility] Melvin Pao, NP   5 months ago Yeast vaginitis   [facility] [place], Megan P, DO   5 months ago Acute non-recurrent frontal sinusitis   [place] [facility] [place], Melanie DASEN, NP

## 2024-06-12 ENCOUNTER — Other Ambulatory Visit: Payer: Self-pay | Admitting: Nurse Practitioner

## 2024-06-25 ENCOUNTER — Ambulatory Visit (INDEPENDENT_AMBULATORY_CARE_PROVIDER_SITE_OTHER): Admitting: Nurse Practitioner

## 2024-06-25 ENCOUNTER — Encounter: Payer: Self-pay | Admitting: Nurse Practitioner

## 2024-06-25 ENCOUNTER — Ambulatory Visit: Payer: Self-pay

## 2024-06-25 VITALS — BP 133/83 | HR 84 | Temp 98.5°F | Resp 18 | Ht 64.02 in | Wt 287.8 lb

## 2024-06-25 DIAGNOSIS — J40 Bronchitis, not specified as acute or chronic: Secondary | ICD-10-CM | POA: Insufficient documentation

## 2024-06-25 MED ORDER — AMOXICILLIN-POT CLAVULANATE 875-125 MG PO TABS: 1.0000 | ORAL_TABLET | Freq: Two times a day (BID) | ORAL | 0 refills | Status: AC

## 2024-06-25 MED ORDER — PREDNISONE 20 MG PO TABS: 40.0000 mg | ORAL_TABLET | Freq: Every day | ORAL | 0 refills | Status: AC

## 2024-06-25 NOTE — Telephone Encounter (Signed)
 FYI Only or Action Required?: FYI only for provider: appointment scheduled on 06/25/24.  Patient was last seen in primary care on 12/29/2023 by Melvin Pao, NP.  Called Nurse Triage reporting Cough.  Symptoms began 2 weeks ago.  Interventions attempted: OTC medications: Cough medicine and Prescription medications: Albuterol  Inhalder.  Symptoms are: unchanged.  Triage Disposition: See HCP Within 4 Hours (Or PCP Triage)  Patient/caregiver understands and will follow disposition?: Yes Reason for Disposition  Wheezing is present  Answer Assessment - Initial Assessment Questions OTC cough medicine, albuterol  inhaler  1. ONSET: When did the cough begin?      2 weeks  2. SEVERITY: How bad is the cough today?      Has not gotten better, but not worse  3. SPUTUM: Describe the color of your sputum (e.g., none, dry cough; clear, white, yellow, green)     Not coughing anything up  4. HEMOPTYSIS: Are you coughing up any blood? If Yes, ask: How much? (e.g., flecks, streaks, tablespoons, etc.)     Denies  5. DIFFICULTY BREATHING: Are you having difficulty breathing? If Yes, ask: How bad is it? (e.g., mild, moderate, severe)      Denies  6. FEVER: Do you have a fever? If Yes, ask: What is your temperature, how was it measured, and when did it start?     Denies  7. CARDIAC HISTORY: Do you have any history of heart disease? (e.g., heart attack, congestive heart failure)      Hypertension  8. LUNG HISTORY: Do you have any history of lung disease?  (e.g., pulmonary embolus, asthma, emphysema)     Denies  9. PE RISK FACTORS: Do you have a history of blood clots? (or: recent major surgery, recent prolonged travel, bedridden)     Denies  10. OTHER SYMPTOMS: Do you have any other symptoms? (e.g., runny nose, wheezing, chest pain)       Mild wheezing with coughing  Protocols used: Cough - Acute Non-Productive-A-AH  Copied from CRM #[id number]. Topic: Clinical -  Red Word Triage >> Jun 25, 2024  9:57 AM [name] wrote: Red Word that prompted transfer to Nurse Triage: Dry cough, inhaler not working nor is cough medication. This has been going on since 06/13/24. Would like an appt or medication called in.  Pharmacy: [facility] [address] ([address]), [place] - [address] [place] ([address]) [place] [address] Phone: [phone] Fax: [phone] Hours: Not open 24 hours

## 2024-06-25 NOTE — Patient Instructions (Signed)

## 2024-06-25 NOTE — Progress Notes (Signed)
 "  BP 133/83 (BP Location: Left Arm, Patient Position: Sitting, Cuff Size: Normal)   Pulse 84   Temp 98.5 F (36.9 C) (Oral)   Resp 18   Ht 5' 4.02 (1.626 m)   Wt 287 lb 12.8 oz (130.5 kg)   LMP 08/20/2015 Comment: denies preg  SpO2 94%   BMI 49.38 kg/m    Subjective:    Patient ID: Yesenia Stevens, female    DOB: Nov 17, 1965, 59 y.o.   MRN: [id number]  HPI: Yesenia Stevens is a 59 y.o. female  Chief Complaint  Patient presents with   Cough    Dry hacking cough she gets every year around this time. Inhaler is not helping. Started taking a cough syrup which left to a lot of congestion. She has been coughing not stop and hacking. Wanted to see if she can get something stronger than what she took to get rid of cough. Congestion seems to be breaking up.   UPPER RESPIRATORY TRACT INFECTION Has had a cough for 2 weeks, gets this annually.  Has congestion with this. Inhaler not offering benefit. A1c 6.5% in July 2025. Fever: no Cough: yes Shortness of breath: yes with coughing Wheezing: yes with coughing Chest pain: no Chest tightness: no Chest congestion: yes Nasal congestion: no Runny nose: no Post nasal drip: no Sneezing: no Sore throat: no Swollen glands: no Sinus pressure: no Headache: no Face pain: no Toothache: no Ear pain: none Ear pressure: none Eyes red/itching:no Eye drainage/crusting: no  Vomiting: no Rash: no Fatigue: yes Sick contacts: no Strep contacts: no  Context: fluctuating Recurrent sinusitis: no Relief with OTC cold/cough medications: no  Treatments attempted: Albuterol  and cough syrup    Relevant past medical, surgical, family and social history reviewed and updated as indicated. Interim medical history since our last visit reviewed. Allergies and medications reviewed and updated.  Review of Systems  Constitutional:  Positive for fatigue. Negative for activity change, appetite change and fever.  HENT:  Negative for congestion, ear  discharge, ear pain, facial swelling, postnasal drip, rhinorrhea, sinus pressure, sinus pain, sneezing, sore throat and voice change.   Respiratory:  Positive for cough, chest tightness, shortness of breath and wheezing.   Cardiovascular:  Negative for chest pain, palpitations and leg swelling.  Gastrointestinal: Negative.   Neurological:  Negative for dizziness, numbness and headaches.  Psychiatric/Behavioral: Negative.      Per HPI unless specifically indicated above     Objective:    BP 133/83 (BP Location: Left Arm, Patient Position: Sitting, Cuff Size: Normal)   Pulse 84   Temp 98.5 F (36.9 C) (Oral)   Resp 18   Ht 5' 4.02 (1.626 m)   Wt 287 lb 12.8 oz (130.5 kg)   LMP 08/20/2015 Comment: denies preg  SpO2 94%   BMI 49.38 kg/m   Wt Readings from Last 3 Encounters:  06/25/24 287 lb 12.8 oz (130.5 kg)  12/29/23 285 lb 3.2 oz (129.4 kg)  11/17/23 279 lb (126.6 kg)    Physical Exam Vitals and nursing note reviewed.  Constitutional:      General: She is awake. She is not in acute distress.    Appearance: She is well-developed and well-groomed. She is obese. She is not ill-appearing or toxic-appearing.  HENT:     Head: Normocephalic.     Right Ear: Hearing, ear canal and external ear normal. A middle ear effusion is present. Tympanic membrane is not injected or perforated.     Left Ear: Hearing, ear canal  and external ear normal. A middle ear effusion is present. Tympanic membrane is not injected or perforated.     Nose: No rhinorrhea.     Right Sinus: No maxillary sinus tenderness or frontal sinus tenderness.     Left Sinus: No maxillary sinus tenderness or frontal sinus tenderness.     Mouth/Throat:     Mouth: Mucous membranes are moist.     Pharynx: Posterior oropharyngeal erythema (mild) and postnasal drip present. No pharyngeal swelling or oropharyngeal exudate.  Eyes:     General: Lids are normal.        Right eye: No discharge.        Left eye: No discharge.      Conjunctiva/sclera: Conjunctivae normal.     Pupils: Pupils are equal, round, and reactive to light.  Neck:     Thyroid: No thyromegaly.     Vascular: No carotid bruit.  Cardiovascular:     Rate and Rhythm: Normal rate and regular rhythm.     Heart sounds: Normal heart sounds. No murmur heard.    No gallop.  Pulmonary:     Effort: Pulmonary effort is normal. No accessory muscle usage or respiratory distress.     Breath sounds: Wheezing (expiratory wheezes noted throughout) present. No decreased breath sounds or rales.  Abdominal:     General: Bowel sounds are normal.     Palpations: Abdomen is soft. There is no hepatomegaly or splenomegaly.  Musculoskeletal:     Cervical back: Normal range of motion and neck supple.     Right lower leg: No edema.     Left lower leg: No edema.  Lymphadenopathy:     Head:     Right side of head: No submental, submandibular, tonsillar, preauricular or posterior auricular adenopathy.     Left side of head: No submental, submandibular, tonsillar, preauricular or posterior auricular adenopathy.     Cervical: No cervical adenopathy.  Skin:    General: Skin is warm and dry.  Neurological:     Mental Status: She is alert and oriented to person, place, and time.  Psychiatric:        Attention and Perception: Attention normal.        Mood and Affect: Mood normal.        Speech: Speech normal.        Behavior: Behavior normal. Behavior is cooperative.        Thought Content: Thought content normal.     Results for orders placed or performed in visit on 12/29/23  Comprehensive metabolic panel with GFR   Collection Time: 12/29/23 11:37 AM  Result Value Ref Range   Glucose 158 (H) 70 - 99 mg/dL   BUN 15 6 - 24 mg/dL   Creatinine, Ser 9.07 0.57 - 1.00 mg/dL   eGFR 73 >40 fO/fpw/8.26   BUN/Creatinine Ratio 16 9 - 23   Sodium 140 134 - 144 mmol/L   Potassium 4.6 3.5 - 5.2 mmol/L   Chloride 102 96 - 106 mmol/L   CO2 23 20 - 29 mmol/L   Calcium  9.3 8.7  - 10.2 mg/dL   Total Protein 7.0 6.0 - 8.5 g/dL   Albumin 4.0 3.8 - 4.9 g/dL   Globulin, Total 3.0 1.5 - 4.5 g/dL   Bilirubin Total 0.3 0.0 - 1.2 mg/dL   Alkaline Phosphatase 80 44 - 121 IU/L   AST 18 0 - 40 IU/L   ALT 14 0 - 32 IU/L  Hemoglobin A1c   Collection Time: 12/29/23  11:37 AM  Result Value Ref Range   Hgb A1c MFr Bld 6.5 (H) 4.8 - 5.6 %   Est. average glucose Bld gHb Est-mCnc 140 mg/dL  Lipid panel   Collection Time: 12/29/23 11:37 AM  Result Value Ref Range   Cholesterol, Total 173 100 - 199 mg/dL   Triglycerides 73 0 - 149 mg/dL   HDL 57 >60 mg/dL   VLDL Cholesterol Cal 14 5 - 40 mg/dL   LDL Chol Calc (NIH) 897 (H) 0 - 99 mg/dL   Chol/HDL Ratio 3.0 0.0 - 4.4 ratio      Assessment & Plan:   Problem List Items Addressed This Visit       Respiratory   Bronchitis - Primary   Acute with cough present for 2 weeks and not improving.  Will start Augmentin  BID for 7 days and Prednisone  40 MG daily for 5 days. Educated her on this plan and alerted her that steroid may push her sugars up for a short period and to monitor. Continue to use inhaler as needed. Recommend: - Increased rest - Increasing Fluids - Acetaminophen as needed for fever/pain.  - Salt water gargling, chloraseptic spray and throat lozenges - Mucinex.  - Saline sinus flushes or a neti pot.  - Humidifying the air.         Follow up plan: Return for as scheduled January 14th with Darice.      "

## 2024-06-25 NOTE — Assessment & Plan Note (Addendum)
 Acute with cough present for 2 weeks and not improving.  Will start Augmentin  BID for 7 days and Prednisone  40 MG daily for 5 days. Educated her on this plan and alerted her that steroid may push her sugars up for a short period and to monitor. Continue to use inhaler as needed. Recommend: - Increased rest - Increasing Fluids - Acetaminophen as needed for fever/pain.  - Salt water gargling, chloraseptic spray and throat lozenges - Mucinex.  - Saline sinus flushes or a neti pot.  - Humidifying the air.

## 2024-07-03 ENCOUNTER — Encounter: Admitting: Nurse Practitioner

## 2024-07-24 ENCOUNTER — Encounter: Admitting: Nurse Practitioner

## 2024-07-24 NOTE — Progress Notes (Unsigned)
 "  LMP 08/20/2015 Comment: denies preg   Subjective:    Patient ID: Yesenia Stevens, female    DOB: 24-Jul-1965, 59 y.o.   MRN: [id number]  HPI: Yesenia Stevens is a 59 y.o. female  No chief complaint on file.  HYPERTENSION / HYPERLIPIDEMIA Satisfied with current treatment? no Duration of hypertension: years BP monitoring frequency: not checking BP range:  BP medication side effects: no Past BP meds: valsartan , HCTZ, and Amlodipine  Duration of hyperlipidemia: years Cholesterol medication side effects: no Cholesterol supplements: none Past cholesterol medications: none Medication compliance: excellent compliance Aspirin : no Recent stressors: no Recurrent headaches: no Visual changes: no Palpitations: no Dyspnea: no Chest pain: no Lower extremity edema: no Dizzy/lightheaded: no   DIABETES Patient would like to change back to mounjaro .  She is having difficulty with nausea from the Ozempic .  She previously tolerated Mounjaro  well.  Also having difficulty with using the needle associated with the Ozempic  pen.  It is very difficult for her to put the needle on and often requires her to use more than 1 needle. Hypoglycemic episodes:no Polydipsia/polyuria: no Visual disturbance: no Chest pain: no Paresthesias: no Glucose Monitoring: no             Accucheck frequency: Not Checking             Fasting glucose:             Post prandial:             Evening:             Before meals: Taking Insulin?: no             Long acting insulin:             Short acting insulin: Blood Pressure Monitoring: not checking Retinal Examination: Not up to Date Foot Exam: Up to Date Diabetic Education: Not Completed Pneumovax: Not up to Date Influenza: Not up to Date Aspirin : no   Relevant past medical, surgical, family and social history reviewed and updated as indicated. Interim medical history since our last visit reviewed. Allergies and medications reviewed and  updated.  Review of Systems  Eyes:  Negative for visual disturbance.  Respiratory:  Negative for cough, chest tightness and shortness of breath.   Cardiovascular:  Negative for chest pain, palpitations and leg swelling.  Endocrine: Negative for polydipsia and polyuria.  Neurological:  Negative for dizziness, numbness and headaches.    Per HPI unless specifically indicated above     Objective:    LMP 08/20/2015 Comment: denies preg  Wt Readings from Last 3 Encounters:  06/25/24 287 lb 12.8 oz (130.5 kg)  12/29/23 285 lb 3.2 oz (129.4 kg)  11/17/23 279 lb (126.6 kg)    Physical Exam Vitals and nursing note reviewed.  Constitutional:      General: She is not in acute distress.    Appearance: Normal appearance. She is obese. She is not ill-appearing, toxic-appearing or diaphoretic.  HENT:     Head: Normocephalic.     Right Ear: External ear normal.     Left Ear: External ear normal.     Nose: Nose normal.     Mouth/Throat:     Mouth: Mucous membranes are moist.     Pharynx: Oropharynx is clear.  Eyes:     General:        Right eye: No discharge.        Left eye: No discharge.     Extraocular Movements: Extraocular movements intact.  Conjunctiva/sclera: Conjunctivae normal.     Pupils: Pupils are equal, round, and reactive to light.  Cardiovascular:     Rate and Rhythm: Normal rate and regular rhythm.     Heart sounds: No murmur heard. Pulmonary:     Effort: Pulmonary effort is normal. No respiratory distress.     Breath sounds: Normal breath sounds. No wheezing or rales.  Musculoskeletal:     Cervical back: Normal range of motion and neck supple.     Comments: Uses a cane due left hip injury  Skin:    General: Skin is warm and dry.     Capillary Refill: Capillary refill takes less than 2 seconds.  Neurological:     General: No focal deficit present.     Mental Status: She is alert and oriented to person, place, and time. Mental status is at baseline.   Psychiatric:        Mood and Affect: Mood normal.        Behavior: Behavior normal.        Thought Content: Thought content normal.        Judgment: Judgment normal.     Results for orders placed or performed in visit on 12/29/23  Comprehensive metabolic panel with GFR   Collection Time: 12/29/23 11:37 AM  Result Value Ref Range   Glucose 158 (H) 70 - 99 mg/dL   BUN 15 6 - 24 mg/dL   Creatinine, Ser 9.07 0.57 - 1.00 mg/dL   eGFR 73 >40 fO/fpw/8.26   BUN/Creatinine Ratio 16 9 - 23   Sodium 140 134 - 144 mmol/L   Potassium 4.6 3.5 - 5.2 mmol/L   Chloride 102 96 - 106 mmol/L   CO2 23 20 - 29 mmol/L   Calcium  9.3 8.7 - 10.2 mg/dL   Total Protein 7.0 6.0 - 8.5 g/dL   Albumin 4.0 3.8 - 4.9 g/dL   Globulin, Total 3.0 1.5 - 4.5 g/dL   Bilirubin Total 0.3 0.0 - 1.2 mg/dL   Alkaline Phosphatase 80 44 - 121 IU/L   AST 18 0 - 40 IU/L   ALT 14 0 - 32 IU/L  Hemoglobin A1c   Collection Time: 12/29/23 11:37 AM  Result Value Ref Range   Hgb A1c MFr Bld 6.5 (H) 4.8 - 5.6 %   Est. average glucose Bld gHb Est-mCnc 140 mg/dL  Lipid panel   Collection Time: 12/29/23 11:37 AM  Result Value Ref Range   Cholesterol, Total 173 100 - 199 mg/dL   Triglycerides 73 0 - 149 mg/dL   HDL 57 >60 mg/dL   VLDL Cholesterol Cal 14 5 - 40 mg/dL   LDL Chol Calc (NIH) 897 (H) 0 - 99 mg/dL   Chol/HDL Ratio 3.0 0.0 - 4.4 ratio      Assessment & Plan:   Problem List Items Addressed This Visit       Cardiovascular and Mediastinum   Hypertension associated with diabetes (HCC)     Endocrine   Diabetes mellitus treated with oral medication (HCC)   Hyperlipidemia associated with type 2 diabetes mellitus (HCC)     Other   Morbid (severe) obesity due to excess calories (HCC) - Primary     Follow up plan: No follow-ups on file.      "

## 2024-07-31 ENCOUNTER — Encounter: Admitting: Nurse Practitioner

## 2024-08-19 ENCOUNTER — Encounter: Admitting: Nurse Practitioner
